# Patient Record
Sex: Female | Born: 1985
Health system: Southern US, Community
[De-identification: ages and names within clinical notes are randomized; demographics above are authoritative.]

## PROBLEM LIST (undated history)

## (undated) DIAGNOSIS — E282 Polycystic ovarian syndrome: Secondary | ICD-10-CM

## (undated) DIAGNOSIS — R112 Nausea with vomiting, unspecified: Secondary | ICD-10-CM

## (undated) DIAGNOSIS — Q103 Other congenital malformations of eyelid: Secondary | ICD-10-CM

## (undated) DIAGNOSIS — Z9889 Other specified postprocedural states: Secondary | ICD-10-CM

## (undated) DIAGNOSIS — E559 Vitamin D deficiency, unspecified: Secondary | ICD-10-CM

## (undated) DIAGNOSIS — Z973 Presence of spectacles and contact lenses: Secondary | ICD-10-CM

## (undated) DIAGNOSIS — E669 Obesity, unspecified: Secondary | ICD-10-CM

## (undated) DIAGNOSIS — E288 Other ovarian dysfunction: Secondary | ICD-10-CM

## (undated) DIAGNOSIS — M549 Dorsalgia, unspecified: Secondary | ICD-10-CM

## (undated) DIAGNOSIS — D649 Anemia, unspecified: Secondary | ICD-10-CM

## (undated) DIAGNOSIS — E049 Nontoxic goiter, unspecified: Secondary | ICD-10-CM

## (undated) DIAGNOSIS — E538 Deficiency of other specified B group vitamins: Secondary | ICD-10-CM

## (undated) DIAGNOSIS — N979 Female infertility, unspecified: Secondary | ICD-10-CM

## (undated) DIAGNOSIS — E2839 Other primary ovarian failure: Secondary | ICD-10-CM

## (undated) DIAGNOSIS — R7303 Prediabetes: Secondary | ICD-10-CM

## (undated) DIAGNOSIS — R002 Palpitations: Secondary | ICD-10-CM

## (undated) HISTORY — DX: Dorsalgia, unspecified: M54.9

## (undated) HISTORY — DX: Female infertility, unspecified: N97.9

## (undated) HISTORY — PX: ADENOIDECTOMY: SUR15

## (undated) HISTORY — DX: Vitamin D deficiency, unspecified: E55.9

## (undated) HISTORY — DX: Obesity, unspecified: E66.9

## (undated) HISTORY — DX: Deficiency of other specified B group vitamins: E53.8

## (undated) HISTORY — DX: Prediabetes: R73.03

## (undated) HISTORY — PX: TONSILLECTOMY: SUR1361

## (undated) HISTORY — DX: Other congenital malformations of eyelid: Q10.3

## (undated) HISTORY — DX: Polycystic ovarian syndrome: E28.2

## (undated) HISTORY — PX: EYE SURGERY: SHX253

---

## 2007-11-04 ENCOUNTER — Emergency Department (HOSPITAL_COMMUNITY): Admission: EM | Admit: 2007-11-04 | Discharge: 2007-11-04 | Payer: Self-pay | Admitting: Emergency Medicine

## 2008-06-21 DIAGNOSIS — S83106A Unspecified dislocation of unspecified knee, initial encounter: Secondary | ICD-10-CM

## 2008-06-21 HISTORY — DX: Unspecified dislocation of unspecified knee, initial encounter: S83.106A

## 2011-03-14 ENCOUNTER — Inpatient Hospital Stay (INDEPENDENT_AMBULATORY_CARE_PROVIDER_SITE_OTHER)
Admission: RE | Admit: 2011-03-14 | Discharge: 2011-03-14 | Disposition: A | Discharge status: Home / Self Care | Payer: 59 | Source: Ambulatory Visit | Attending: Emergency Medicine | Admitting: Emergency Medicine

## 2011-03-14 DIAGNOSIS — S91309A Unspecified open wound, unspecified foot, initial encounter: Secondary | ICD-10-CM

## 2014-04-09 ENCOUNTER — Ambulatory Visit (HOSPITAL_COMMUNITY)
Admission: RE | Admit: 2014-04-09 | Discharge: 2014-04-09 | Disposition: A | Discharge status: Home / Self Care | Payer: 59 | Source: Ambulatory Visit | Attending: Obstetrics and Gynecology | Admitting: Obstetrics and Gynecology

## 2014-04-09 ENCOUNTER — Other Ambulatory Visit (HOSPITAL_COMMUNITY): Payer: Self-pay

## 2014-04-09 DIAGNOSIS — Z13228 Encounter for screening for other metabolic disorders: Secondary | ICD-10-CM | POA: Diagnosis not present

## 2014-04-09 DIAGNOSIS — Z315 Encounter for genetic counseling: Secondary | ICD-10-CM | POA: Insufficient documentation

## 2014-04-09 LAB — CHROMOSOMES ANALYSIS FOR CF

## 2014-04-18 ENCOUNTER — Encounter (HOSPITAL_COMMUNITY): Payer: Self-pay

## 2014-04-18 NOTE — Progress Notes (Signed)
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 [employer] Genetic Counseling Intern, Santiago Bumpers, assisted with genetic counseling under my direct supervision.   We began by reviewing the family history in detail. 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 She reportedly does not know if the BPES in the family is type I or type II. She had one corrective surgery at 28 years of age and currently wears glasses. [name] reports irregular periods and is currently taking metformin for treatment of Polycystic Ovarian syndrome (PCOS). She reported that she has not been pregnant and has not attempted to conceive. She reported multiple relatives with BPES, and the specific type of BPES in the family is not known.  [name] reported that her father had a diagnosis of BPES and had surgical correction in adulthood. He died in 09/16/2000 from myocardial infarction. She reported that her oldest sibling (a sister) is 8 years old and has BPES. This sister reportedly had corrective surgery in childhood for BPES and does not wear glasses currently. This sister has one 62 year old daughter with BPES and otherwise healthy. [name] reported that her sister did not have trouble conceiving. This daughter (the patient's niece) has had corrective surgery and does not currently wear glasses. The patient reported that her brother (age 66 years) also has BPES. He did not have corrective surgery and does not wear glasses. This brother reportedly has two children: one son with BPES and one unaffected daughter. [name] has a fraternal twin sister who does not have BPES.   [name] reported additional paternal  relatives with BPES: one paternal uncle and her paternal grandfather.  Her father's other brother and maternal half-sister reportedly do not have BPES.  Ms. Shann Gaines paternal uncle with BPES married the patient's mother's sister, and this couple has three daughters. Two of the three daughters (the patient's first cousins) reportedly have BPES. The older of the affected daughters is 41 years old and has a history of infertility. She has been trying to conceive for several years without success. She reportedly has polycystic ovarian syndrome and an enlarged heart.  Consanguinity between the maternal and paternal side of the family was denied.   BPES is a genetic condition that affects the development of the eye and eyelids. The name of the syndrome comes from the various eye findings that have been associated with the condition: blepharophimosis (narrowing of the eye opening), ptosis (eyelid drooping), and epicanthus inversus (upward fold of the skin in the lower eyelid near the inner corner of the eye). The diagnosis of BPES is based on the presence of these three clinical findings, with the addition of telecanthus (lateral displacement of inner canthi) and are present at birth. Individuals with BPES are at risk of developing secondary vision problems including myopia, hyperopia, strabismus, or amblyopia. Some individuals with BPES have distinctive facial features including a broad nasal bridge, low set ears, a short philtrum, and wide set eyes. We discussed that BPES is currently described to have two types: Type I and Type II. Both types share the facial features and eye findings. BPES Type I is also associated with Primary Ovarian Insufficiency (POI). BPES Type II includes only the  four major features.   Primary Ovarian Insufficiency is defined as oligomenorrhea or amenorrhea occurring prior to age 40 years.  Clinical features of POI can include irregular menstrual cycles, difficulty conceiving, and symptoms  related to estrogen deficiency. Typical biochemical features of POI include low serum estradiol and elevated serum FSH concentrations.  We discussed that women with POI can have varying features. We also discussed the variable expressivity of BPES, even within families.  Regarding establishing the risk for POI in an individual with BPES, family history can indicate the type of BPES in affected females. Additionally, molecular genetic testing may be helpful in some cases in assessing the risk for POI. Females with BPES may be evaluated by a pediatric or adult endocrinologist to assess onset and course of POI.   BPES is an autosomal dominant genetic condition. We reviewed genes, chromosomes, and autosomal dominant inheritance.  BPES is caused by changes in FOXL2 gene. We discussed that FOXL2 is a transcription factor that directs the function of other genes. It has been shown that the resulting protein is active in the eyelids, ovaries and pituitary gland. Specifically, during development the protein is active in the muscles of the eyelids. It was explained that changes in the FOXL2 gene disrupt this developmental pathway and lead to the characteristic eye findings associated with BPES. It was discussed that FOXL2 regulates granulosa/follicular cells in the ovaries in addition to regulating apoptosis. A change in FOXL2 associated with BPES Type I leads to a rapid maturation of granulosa/follicular cells during ovulation and results in the egg cell breaking down prematurely. In autosomal dominant inheritance, a person typically has the condition when they have one changed (nonworking) copy of a particular gene pair.  There is a 50% (1 in 2) chance that an individual with an autosomal dominant condition would pass on the changed (nonworking) gene copy to each offspring. Thus, in BPES, each pregnancy of an affected individual has 1 in 2 (50%) chance to inherit BPES. Nearly complete penetrance is described for BPES for  the eye findings.   All individuals with BPES in a family would be expected to have the same type. However, variable expressivity is described, even within families, meaning that the specific features present for each individual can vary, including features of POI for type I BPES. Thus, [name] would have a 1 in 2 (50%) chance for each pregnancy to inherit BPES.  We discussed that changes in the FOXL2 gene are causative of both types of BPES. About 10-15% of individuals with BPES have a genetic deletion encompassing the FOXL2 gene; 72% of individuals have sequence changes within the gene; and 5% of individuals have genetic mutations in external regulatory regions. A molecular cause for BPES is found in the majority but not all individuals with a clinical diagnosis.  Some genotype-phenotype correlation has been described for BPES, meaning that the type of genetic change in FOXL2 determines the type of BPES present in some, but not all, cases. For example, polyalanine expansion within the FOXL2 gene has been described to preferentially lead to BPES Type II, and pathogenic changes in the FOXL2 gene that lead to protein truncation prior to the polyalanine tract preferentially lead to BPES type I (associated with POI). It was explained that while there are some genotypes that are associated with both Type I and II, these classifications are not always strictly defined. Thus, while there appears to be an association between the level of function of the changed gene and the type of  BPES developed, these are not diagnostic classifications.   Given the reported family history of the female cousin with BPES and history of infertility, we discussed that type I BPES is most likely the type present in Ms. Davis's family. However, we discussed that additional older relatives with BPES are female and thus, not informative regarding the risk for POI in the family and also that infertility/POI have also been described in  families with BPES due to other etiologies.   We discussed that genetic testing is clinically available for FOXL2 and, in some cases, may be helpful in determining  which type of BPES is in the family and confirming a clinical diagnosis of BPES. We reviewed the risks, benefits, and limitations of molecular testing. We discussed that there were various possibilities for her genetic testing results including: an identified genetic cause with clear genotype-phenotype correlation (leading to determination of the type of BPES in the patient), an identified genetic cause for BPES without clear association with a specific type of BPES, no identified gene change, or a benign gene change or gene change of uncertain significance.  In the last potential scenario, family testing for variant of uncertain significance could be informative in determining suspected pathogenicity, but not diagnostic. [name] understands that molecular testing for FOXL2 can, in some but not all cases, determine the specific type of BPES in the family and is not always informative for individuals with a clinical diagnosis of BPES.   After careful consideration, [name] chose to have sequencing and deletion/duplication analysis of the FOXL2 gene through [facility]. She was counseled regarding the expense of this testing.  The amount she will be responsible for, out of pocket, depends upon the  specific plan and is subject to co-pay, co-insurance and/or deductible. We discussed that turnaround time for this testing is approximately 4-6 weeks. We discussed that she would be called by our office when the result was available.     The family histories were otherwise found to be noncontributory for birth defects, intellectual disability, and known genetic conditions. Without further information regarding the provided family history, an accurate genetic risk cannot be calculated. Further  genetic counseling is warranted if more information is obtained.  Ms. Caraline Deutschman was provided with written information regarding cystic fibrosis (CF) including the carrier frequency and incidence in the Caucasian population, the availability of carrier testing and prenatal diagnosis if indicated. In addition, we discussed that CF is routinely screened for as part of the [place] newborn screening panel. She elected to pursue testing today.  I counseled [name] regarding the above risks and available options.  The approximate face-to-face time with the genetic counselor was 45 minutes.  Chipper Oman, MS Certified Genetic Counselor 04/18/2014

## 2014-04-23 ENCOUNTER — Telehealth (HOSPITAL_COMMUNITY): Payer: Self-pay | Admitting: MS"

## 2014-04-23 NOTE — Telephone Encounter (Signed)
Called [name] regarding results of cystic fibrosis carrier screening, which yielded a normal/negative result for the 32 most common disease-causing mutations analyzed, meaning that the risk to be a CF carrier can be reduced from 1 in 25 to approximately 1 in 250.  The patient understands that CF carrier screening can reduce but not eliminate the chance to be a CF carrier.   [name] 04/23/2014 11:21 AM

## 2014-04-26 ENCOUNTER — Other Ambulatory Visit (HOSPITAL_COMMUNITY): Payer: Self-pay

## 2014-05-14 ENCOUNTER — Telehealth (HOSPITAL_COMMUNITY): Payer: Self-pay | Admitting: MS"

## 2014-05-14 NOTE — Telephone Encounter (Signed)
[employer] Genetic Counseling Intern, Santiago Bumpers, called [name] regarding genetic testing results for BPES. Molecular testing for FOXL2 was performed through [facility]. [name] elected to proceed with FOXL2 molecular testing given her clinical diagnosis of BPES. Patient was identified by name and DOB. 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 This mutation has been previously reported in patients with BPES and causes a truncated protein containing an intact forkhead domain and polyalanine tract. It was discussed with the patient that no correlation can be made between the genotype and BPES phenotype [facility] Lacinda Axon Hum genet 37: [phone], 2003). The patient was understandably disappointed by this information. We discussed that endocrinologic and gynecologic follow-up are advised in females with BPES in whom the BPES is unknown or in whom BPES type I is suspected based on family history. The patient was encouraged to contact us with additional questions or concerns.   Chipper Oman, MS Certified Genetic Counselor 05/14/2014 9:33 AM

## 2014-05-21 ENCOUNTER — Other Ambulatory Visit (HOSPITAL_COMMUNITY): Payer: Self-pay

## 2014-06-17 ENCOUNTER — Ambulatory Visit: Payer: 59 | Admitting: *Deleted

## 2014-06-20 ENCOUNTER — Ambulatory Visit: Payer: 59 | Admitting: *Deleted

## 2014-06-28 ENCOUNTER — Encounter: Payer: Self-pay | Admitting: Dietician

## 2014-06-28 ENCOUNTER — Encounter: Payer: 59 | Attending: "Endocrinology | Admitting: Dietician

## 2014-06-28 DIAGNOSIS — Z6841 Body Mass Index (BMI) 40.0 and over, adult: Secondary | ICD-10-CM | POA: Diagnosis not present

## 2014-06-28 DIAGNOSIS — E669 Obesity, unspecified: Secondary | ICD-10-CM | POA: Insufficient documentation

## 2014-06-28 DIAGNOSIS — Z713 Dietary counseling and surveillance: Secondary | ICD-10-CM | POA: Diagnosis not present

## 2014-06-28 NOTE — Progress Notes (Signed)
  Medical Nutrition Therapy:  Appt start time: 0945 end time:  1100.   Assessment:  Primary concerns today: PCOS, inability to lose weight.  Patient reports recent diagnosis of bpes, premature ovarian failure and pcos.. Now taking Metformin.  Some problems with diarrhea from medication.  States that HgbA1C was 5.1%.  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  Has begun exercising 30 minutes 3 times per week.  Had been tracking intake with my fitness pal but frustrated by tediousness.  Preferred Learning Style:   Visual  Learning Readiness  Ready  MEDICATIONS: Metformin 500 mg in am and 100 mg in pm.  Takes with meals   DIETARY INTAKE:  Patient has begun cooking at home more and being more mindful of meal choices.  Diet recall less than actual intake.    24-hr recall:  B ( AM): cereal with milk or omlet with fruit  Snk ( AM): none  L ( PM): subway or salad or sandwich Snk ( PM): none D ( PM): pasta, chicken Snk ( PM): fruit or chips and dip Beverages: more water, little soda  Usual physical activity: gym 30 minutes 3 x per week, 8000-10,000 steps on fit bit at work  Estimated energy needs: 1800 calories 200 g carbohydrates 113 g protein 60 g fat  Progress Towards Goal(s):  In progress.   Nutritional Diagnosis:  NB-1.1 Food and nutrition-related knowledge deficit As related to CHO and protein balance.  As evidenced by diet hx and patient report..    Intervention:  Nutrition education on a CHO balanced diet with increased emphasis of whole foods. Recommendations: Be mindful of portion sizes and CHO servings Increase activity as able  Teaching Method Utilized:  Visual Auditory Hands on  Handouts given during visit include:  Yellow Carb Card  Low Carb/protein snack list  Label reading  Barriers to learning/adherence to lifestyle change: Work schedule  Demonstrated degree of understanding via:  Teach Back   Monitoring/Evaluation:  Dietary intake, exercise, label reading, and  body weight prn.

## 2014-08-01 ENCOUNTER — Encounter: Payer: Self-pay | Admitting: Nurse Practitioner

## 2014-08-01 ENCOUNTER — Ambulatory Visit (INDEPENDENT_AMBULATORY_CARE_PROVIDER_SITE_OTHER): Payer: 59 | Admitting: Nurse Practitioner

## 2014-08-01 VITALS — BP 120/80 | HR 74 | Temp 97.8°F | Ht 68.5 in | Wt 361.0 lb

## 2014-08-01 DIAGNOSIS — Q103 Other congenital malformations of eyelid: Secondary | ICD-10-CM | POA: Insufficient documentation

## 2014-08-01 DIAGNOSIS — Z23 Encounter for immunization: Secondary | ICD-10-CM

## 2014-08-01 DIAGNOSIS — E282 Polycystic ovarian syndrome: Secondary | ICD-10-CM | POA: Insufficient documentation

## 2014-08-01 DIAGNOSIS — Z0000 Encounter for general adult medical examination without abnormal findings: Secondary | ICD-10-CM

## 2014-08-01 DIAGNOSIS — E049 Nontoxic goiter, unspecified: Secondary | ICD-10-CM

## 2014-08-01 DIAGNOSIS — Z6841 Body Mass Index (BMI) 40.0 and over, adult: Secondary | ICD-10-CM | POA: Insufficient documentation

## 2014-08-01 DIAGNOSIS — R5383 Other fatigue: Secondary | ICD-10-CM

## 2014-08-01 LAB — CBC WITH DIFFERENTIAL/PLATELET
BASOS ABS: 0 10*3/uL (ref 0.0–0.1)
BASOS PCT: 0.3 % (ref 0.0–3.0)
EOS PCT: 2.9 % (ref 0.0–5.0)
Eosinophils Absolute: 0.2 10*3/uL (ref 0.0–0.7)
HCT: 35.8 % — ABNORMAL LOW (ref 36.0–46.0)
HEMOGLOBIN: 11.9 g/dL — AB (ref 12.0–15.0)
LYMPHS PCT: 28 % (ref 12.0–46.0)
Lymphs Abs: 2.1 10*3/uL (ref 0.7–4.0)
MCHC: 33.2 g/dL (ref 30.0–36.0)
MCV: 78.8 fl (ref 78.0–100.0)
MONOS PCT: 7.9 % (ref 3.0–12.0)
Monocytes Absolute: 0.6 10*3/uL (ref 0.1–1.0)
NEUTROS ABS: 4.5 10*3/uL (ref 1.4–7.7)
Neutrophils Relative %: 60.9 % (ref 43.0–77.0)
Platelets: 204 10*3/uL (ref 150.0–400.0)
RBC: 4.55 Mil/uL (ref 3.87–5.11)
RDW: 15.7 % — ABNORMAL HIGH (ref 11.5–15.5)
WBC: 7.4 10*3/uL (ref 4.0–10.5)

## 2014-08-01 LAB — URINALYSIS, MICROSCOPIC ONLY

## 2014-08-01 LAB — COMPREHENSIVE METABOLIC PANEL
ALT: 22 U/L (ref 0–35)
AST: 19 U/L (ref 0–37)
Albumin: 4 g/dL (ref 3.5–5.2)
Alkaline Phosphatase: 64 U/L (ref 39–117)
BUN: 13 mg/dL (ref 6–23)
CALCIUM: 9.1 mg/dL (ref 8.4–10.5)
CHLORIDE: 103 meq/L (ref 96–112)
CO2: 26 mEq/L (ref 19–32)
CREATININE: 0.69 mg/dL (ref 0.40–1.20)
GFR: 108.56 mL/min (ref >60.00)
Glucose, Bld: 83 mg/dL (ref 70–99)
Potassium: 4.6 mEq/L (ref 3.5–5.1)
Sodium: 137 mEq/L (ref 135–145)
Total Bilirubin: 0.5 mg/dL (ref 0.2–1.2)
Total Protein: 6.9 g/dL (ref 6.0–8.3)

## 2014-08-01 LAB — LIPID PANEL
CHOLESTEROL: 188 mg/dL (ref 0–200)
HDL: 49.5 mg/dL (ref >39.00)
LDL CALC: 117 mg/dL — AB (ref 0–99)
NonHDL: 138.5
Total CHOL/HDL Ratio: 4
Triglycerides: 109 mg/dL (ref 0.0–149.0)
VLDL: 21.8 mg/dL (ref 0.0–40.0)

## 2014-08-01 LAB — VITAMIN B12: VITAMIN B 12: 262 pg/mL (ref 211–911)

## 2014-08-01 LAB — VITAMIN D 25 HYDROXY (VIT D DEFICIENCY, FRACTURES): VITD: 17.15 ng/mL — AB (ref 30.00–100.00)

## 2014-08-01 LAB — TSH: TSH: 1.77 u[IU]/mL (ref 0.35–4.50)

## 2014-08-01 LAB — T4, FREE: Free T4: 1.2 ng/dL (ref 0.60–1.60)

## 2014-08-01 NOTE — Patient Instructions (Signed)
My office will call with lab results.  Develop lifelong habits of exercise most days of the week: take a 30 minute walk. The benefits include weight loss, lower risk for heart disease, diabetes, stroke, high blood pressure, lower rates of depression & dementia, better sleep quality & bone health.  Consider reading Eat to Live by Excell Seltzer and begin implementing principles. Replace foods made with flour with plant foods like beans, peas, other vegetables and fresh fruit.  Cut out refined sugar:anything that is sweet when you eat or drink it except fresh fruit. Cut out white bread, rolls, biscuits, bagels, muffins, pasta and cereals. Breads & cereals that have 4 gm or more of fiber per serving are good. Whole wheat pasta, brown rice and quinoa are good.  For 6 weeks, incorporate portion sizes.  See me in 6 weeks!  Very nice to meet you!  Our office will call you with lab results and any necessary follow up. Pleasure to meet you!  Preventive Care for Adults, Female A healthy lifestyle and preventive care can promote health and wellness. Preventive health guidelines for women include the following key practices.  A routine yearly physical is a good way to check with your caregiver about your health and preventive screening. It is a chance to share any concerns and updates on your health, and to receive a thorough exam.  Visit your dentist for a routine exam and preventive care every 6 months. Brush your teeth twice a day and floss once a day. Good oral hygiene prevents tooth decay and gum disease.  The frequency of eye exams is based on your age, health, family medical history, use of contact lenses, and other factors. Follow your caregiver's recommendations for frequency of eye exams.  Eat a healthy diet. Foods like vegetables, fruits, whole grains, low-fat dairy products, and lean protein foods contain the nutrients you need without too many calories. Decrease your intake of foods high in  solid fats, added sugars, and salt. Eat the right amount of calories for you.Get information about a proper diet from your caregiver, if necessary.  Regular physical exercise is one of the most important things you can do for your health. Most adults should get at least 150 minutes of moderate-intensity exercise (any activity that increases your heart rate and causes you to sweat) each week. In addition, most adults need muscle-strengthening exercises on 2 or more days a week.  Maintain a healthy weight. The body mass index (BMI) is a screening tool to identify possible weight problems. It provides an estimate of body fat based on height and weight. Your caregiver can help determine your BMI, and can help you achieve or maintain a healthy weight.For adults 20 years and older:  A BMI below 18.5 is considered underweight.  A BMI of 18.5 to 24.9 is normal.  A BMI of 25 to 29.9 is considered overweight.  A BMI of 30 and above is considered obese.  Maintain normal blood lipids and cholesterol levels by exercising and minimizing your intake of saturated fat. Eat a balanced diet with plenty of fruit and vegetables. Blood tests for lipids and cholesterol should begin at age 72 and be repeated every 5 years. If your lipid or cholesterol levels are high, you are over 50, or you are at high risk for heart disease, you may need your cholesterol levels checked more frequently.Ongoing high lipid and cholesterol levels should be treated with medicines if diet and exercise are not effective.  If you smoke, find  out from your caregiver how to quit. If you do not use tobacco, do not start.  Lung cancer screening is recommended for adults aged 54 80 years who are at high risk for developing lung cancer because of a history of smoking. Yearly low-dose computed tomography (CT) is recommended for people who have at least a 30-pack-year history of smoking and are a current smoker or have quit within the past 15 years.  A pack year of smoking is smoking an average of 1 pack of cigarettes a day for 1 year (for example: 1 pack a day for 30 years or 2 packs a day for 15 years). Yearly screening should continue until the smoker has stopped smoking for at least 15 years. Yearly screening should also be stopped for people who develop a health problem that would prevent them from having lung cancer treatment.  If you are pregnant, do not drink alcohol. If you are breastfeeding, be very cautious about drinking alcohol. If you are not pregnant and choose to drink alcohol, do not exceed 1 drink per day. One drink is considered to be 12 ounces (355 mL) of beer, 5 ounces (148 mL) of wine, or 1.5 ounces (44 mL) of liquor.  Avoid use of street drugs. Do not share needles with anyone. Ask for help if you need support or instructions about stopping the use of drugs.  High blood pressure causes heart disease and increases the risk of stroke. Your blood pressure should be checked at least every 1 to 2 years. Ongoing high blood pressure should be treated with medicines if weight loss and exercise are not effective.  If you are 48 to 27 years old, ask your caregiver if you should take aspirin to prevent strokes.  Diabetes screening involves taking a blood sample to check your fasting blood sugar level. This should be done once every 3 years, after age 4, if you are within normal weight and without risk factors for diabetes. Testing should be considered at a younger age or be carried out more frequently if you are overweight and have at least 1 risk factor for diabetes.  Breast cancer screening is essential preventive care for women. You should practice "breast self-awareness." This means understanding the normal appearance and feel of your breasts and may include breast self-examination. Any changes detected, no matter how small, should be reported to a caregiver. Women in their 54s and 30s should have a clinical breast exam (CBE) by a  caregiver as part of a regular health exam every 1 to 3 years. After age 37, women should have a CBE every year. Starting at age 3, women should consider having a mammography (breast X-ray test) every year. Women who have a family history of breast cancer should talk to their caregiver about genetic screening. Women at a high risk of breast cancer should talk to their caregivers about having magnetic resonance imaging (MRI) and a mammography every year.  Breast cancer gene (BRCA)-related cancer risk assessment is recommended for women who have family members with BRCA-related cancers. BRCA-related cancers include breast, ovarian, tubal, and peritoneal cancers. Having family members with these cancers may be associated with an increased risk for harmful changes (mutations) in the breast cancer genes BRCA1 and BRCA2. Results of the assessment will determine the need for genetic counseling and BRCA1 and BRCA2 testing.  The Pap test is a screening test for cervical cancer. A Pap test can show cell changes on the cervix that might become cervical cancer if left  untreated. A Pap test is a procedure in which cells are obtained and examined from the lower end of the uterus (cervix).  Women should have a Pap test starting at age 65.  Between ages 40 and 74, Pap tests should be repeated every 2 years.  Beginning at age 22, you should have a Pap test every 3 years as long as the past 3 Pap tests have been normal.  Some women have medical problems that increase the chance of getting cervical cancer. Talk to your caregiver about these problems. It is especially important to talk to your caregiver if a new problem develops soon after your last Pap test. In these cases, your caregiver may recommend more frequent screening and Pap tests.  The above recommendations are the same for women who have or have not gotten the vaccine for human papillomavirus (HPV).  If you had a hysterectomy for a problem that was not  cancer or a condition that could lead to cancer, then you no longer need Pap tests. Even if you no longer need a Pap test, a regular exam is a good idea to make sure no other problems are starting.  If you are between ages 31 and 89, and you have had normal Pap tests going back 10 years, you no longer need Pap tests. Even if you no longer need a Pap test, a regular exam is a good idea to make sure no other problems are starting.  If you have had past treatment for cervical cancer or a condition that could lead to cancer, you need Pap tests and screening for cancer for at least 20 years after your treatment.  If Pap tests have been discontinued, risk factors (such as a new sexual partner) need to be reassessed to determine if screening should be resumed.  The HPV test is an additional test that may be used for cervical cancer screening. The HPV test looks for the virus that can cause the cell changes on the cervix. The cells collected during the Pap test can be tested for HPV. The HPV test could be used to screen women aged 91 years and older, and should be used in women of any age who have unclear Pap test results. After the age of 5, women should have HPV testing at the same frequency as a Pap test.  Colorectal cancer can be detected and often prevented. Most routine colorectal cancer screening begins at the age of 71 and continues through age 73. However, your caregiver may recommend screening at an earlier age if you have risk factors for colon cancer. On a yearly basis, your caregiver may provide home test kits to check for hidden blood in the stool. Use of a small camera at the end of a tube, to directly examine the colon (sigmoidoscopy or colonoscopy), can detect the earliest forms of colorectal cancer. Talk to your caregiver about this at age 17, when routine screening begins. Direct examination of the colon should be repeated every 5 to 10 years through age 48, unless early forms of pre-cancerous  polyps or small growths are found.  Hepatitis C blood testing is recommended for all people born from [birth year] through 1965 and any individual with known risks for hepatitis C.  Practice safe sex. Use condoms and avoid high-risk sexual practices to reduce the spread of sexually transmitted infections (STIs). STIs include gonorrhea, chlamydia, syphilis, trichomonas, herpes, HPV, and human immunodeficiency virus (HIV). Herpes, HIV, and HPV are viral illnesses that have no cure. They  can result in disability, cancer, and death. Sexually active women aged 58 and younger should be checked for chlamydia. Older women with new or multiple partners should also be tested for chlamydia. Testing for other STIs is recommended if you are sexually active and at increased risk.  Osteoporosis is a disease in which the bones lose minerals and strength with aging. This can result in serious bone fractures. The risk of osteoporosis can be identified using a bone density scan. Women ages 55 and over and women at risk for fractures or osteoporosis should discuss screening with their caregivers. Ask your caregiver whether you should take a calcium supplement or vitamin D to reduce the rate of osteoporosis.  Menopause can be associated with physical symptoms and risks. Hormone replacement therapy is available to decrease symptoms and risks. You should talk to your caregiver about whether hormone replacement therapy is right for you.  Use sunscreen. Apply sunscreen liberally and repeatedly throughout the day. You should seek shade when your shadow is shorter than you. Protect yourself by wearing long sleeves, pants, a wide-brimmed hat, and sunglasses year round, whenever you are outdoors.  Once a month, do a whole body skin exam, using a mirror to look at the skin on your back. Notify your caregiver of new moles, moles that have irregular borders, moles that are larger than a pencil eraser, or moles that have changed in shape or  color.  Stay current with required immunizations.  Influenza vaccine. All adults should be immunized every year.  Tetanus, diphtheria, and acellular pertussis (Td, Tdap) vaccine. Pregnant women should receive 1 dose of Tdap vaccine during each pregnancy. The dose should be obtained regardless of the length of time since the last dose. Immunization is preferred during the 27th to 36th week of gestation. An adult who has not previously received Tdap or who does not know her vaccine status should receive 1 dose of Tdap. This initial dose should be followed by tetanus and diphtheria toxoids (Td) booster doses every 10 years. Adults with an unknown or incomplete history of completing a 3-dose immunization series with Td-containing vaccines should begin or complete a primary immunization series including a Tdap dose. Adults should receive a Td booster every 10 years.  Varicella vaccine. An adult without evidence of immunity to varicella should receive 2 doses or a second dose if she has previously received 1 dose. Pregnant females who do not have evidence of immunity should receive the first dose after pregnancy. This first dose should be obtained before leaving the health care facility. The second dose should be obtained 4 8 weeks after the first dose.  Human papillomavirus (HPV) vaccine. Females aged 42 26 years who have not received the vaccine previously should obtain the 3-dose series. The vaccine is not recommended for use in pregnant females. However, pregnancy testing is not needed before receiving a dose. If a female is found to be pregnant after receiving a dose, no treatment is needed. In that case, the remaining doses should be delayed until after the pregnancy. Immunization is recommended for any person with an immunocompromised condition through the age of 36 years if she did not get any or all doses earlier. During the 3-dose series, the second dose should be obtained 4 8 weeks after the first  dose. The third dose should be obtained 24 weeks after the first dose and 16 weeks after the second dose.  Zoster vaccine. One dose is recommended for adults aged 59 years or older unless certain  conditions are present.  Measles, mumps, and rubella (MMR) vaccine. Adults born before [birth year] generally are considered immune to measles and mumps. Adults born in [birth year] or later should have 1 or more doses of MMR vaccine unless there is a contraindication to the vaccine or there is laboratory evidence of immunity to each of the three diseases. A routine second dose of MMR vaccine should be obtained at least 28 days after the first dose for students attending postsecondary schools, health care workers, or international travelers. People who received inactivated measles vaccine or an unknown type of measles vaccine during 1963 1967 should receive 2 doses of MMR vaccine. People who received inactivated mumps vaccine or an unknown type of mumps vaccine before 1979 and are at high risk for mumps infection should consider immunization with 2 doses of MMR vaccine. For females of childbearing age, rubella immunity should be determined. If there is no evidence of immunity, females who are not pregnant should be vaccinated. If there is no evidence of immunity, females who are pregnant should delay immunization until after pregnancy. Unvaccinated health care workers born before [birth year] who lack laboratory evidence of measles, mumps, or rubella immunity or laboratory confirmation of disease should consider measles and mumps immunization with 2 doses of MMR vaccine or rubella immunization with 1 dose of MMR vaccine.  Pneumococcal 13-valent conjugate (PCV13) vaccine. When indicated, a person who is uncertain of her immunization history and has no record of immunization should receive the PCV13 vaccine. An adult aged 70 years or older who has certain medical conditions and has not been previously immunized should receive 1 dose of PCV13  vaccine. This PCV13 should be followed with a dose of pneumococcal polysaccharide (PPSV23) vaccine. The PPSV23 vaccine dose should be obtained at least 8 weeks after the dose of PCV13 vaccine. An adult aged 48 years or older who has certain medical conditions and previously received 1 or more doses of PPSV23 vaccine should receive 1 dose of PCV13. The PCV13 vaccine dose should be obtained 1 or more years after the last PPSV23 vaccine dose.  Pneumococcal polysaccharide (PPSV23) vaccine. When PCV13 is also indicated, PCV13 should be obtained first. All adults aged 23 years and older should be immunized. An adult younger than age 18 years who has certain medical conditions should be immunized. Any person who resides in a nursing home or long-term care facility should be immunized. An adult smoker should be immunized. People with an immunocompromised condition and certain other conditions should receive both PCV13 and PPSV23 vaccines. People with human immunodeficiency virus (HIV) infection should be immunized as soon as possible after diagnosis. Immunization during chemotherapy or radiation therapy should be avoided. Routine use of PPSV23 vaccine is not recommended for American Indians, [place] Natives, or people younger than 65 years unless there are medical conditions that require PPSV23 vaccine. When indicated, people who have unknown immunization and have no record of immunization should receive PPSV23 vaccine. One-time revaccination 5 years after the first dose of PPSV23 is recommended for people aged 69 64 years who have chronic kidney failure, nephrotic syndrome, asplenia, or immunocompromised conditions. People who received 1 2 doses of PPSV23 before age 46 years should receive another dose of PPSV23 vaccine at age 44 years or later if at least 5 years have passed since the previous dose. Doses of PPSV23 are not needed for people immunized with PPSV23 at or after age 75 years.  Meningococcal vaccine. Adults  with asplenia or persistent complement component deficiencies should receive 2  doses of quadrivalent meningococcal conjugate (MenACWY-D) vaccine. The doses should be obtained at least 2 months apart. Microbiologists working with certain meningococcal bacteria, [employer] recruits, people at risk during an outbreak, and people who travel to or live in countries with a high rate of meningitis should be immunized. A first-year college student up through age 29 years who is living in a residence hall should receive a dose if she did not receive a dose on or after her 16th birthday. Adults who have certain high-risk conditions should receive one or more doses of vaccine.  Hepatitis A vaccine. Adults who wish to be protected from this disease, have certain high-risk conditions, work with hepatitis A-infected animals, work in hepatitis A research labs, or travel to or work in countries with a high rate of hepatitis A should be immunized. Adults who were previously unvaccinated and who anticipate close contact with an international adoptee during the first 60 days after arrival in the [place] from a country with a high rate of hepatitis A should be immunized.  Hepatitis B vaccine. Adults who wish to be protected from this disease, have certain high-risk conditions, may be exposed to blood or other infectious body fluids, are household contacts or sex partners of hepatitis B positive people, are clients or workers in certain care facilities, or travel to or work in countries with a high rate of hepatitis B should be immunized.  Haemophilus influenzae type b (Hib) vaccine. A previously unvaccinated person with asplenia or sickle cell disease or having a scheduled splenectomy should receive 1 dose of Hib vaccine. Regardless of previous immunization, a recipient of a hematopoietic stem cell transplant should receive a 3-dose series 6 12 months after her successful transplant. Hib vaccine is not recommended for adults  with HIV infection. Preventive Services / Frequency Ages 78 to 3  Blood pressure check.** / Every 1 to 2 years.  Lipid and cholesterol check.** / Every 5 years beginning at age 22.  Clinical breast exam.** / Every 3 years for women in their 27s and 27s.  BRCA-related cancer risk assessment.** / For women who have family members with a BRCA-related cancer (breast, ovarian, tubal, or peritoneal cancers).  Pap test.** / Every 2 years from ages 40 through 63. Every 3 years starting at age 22 through age 25 or 28 with a history of 3 consecutive normal Pap tests.  HPV screening.** / Every 3 years from ages 29 through ages 50 to 42 with a history of 3 consecutive normal Pap tests.  Hepatitis C blood test.** / For any individual with known risks for hepatitis C.  Skin self-exam. / Monthly.  Influenza vaccine. / Every year.  Tetanus, diphtheria, and acellular pertussis (Tdap, Td) vaccine.** / Consult your caregiver. Pregnant women should receive 1 dose of Tdap vaccine during each pregnancy. 1 dose of Td every 10 years.  Varicella vaccine.** / Consult your caregiver. Pregnant females who do not have evidence of immunity should receive the first dose after pregnancy.  HPV vaccine. / 3 doses over 6 months, if 52 and younger. The vaccine is not recommended for use in pregnant females. However, pregnancy testing is not needed before receiving a dose.  Measles, mumps, rubella (MMR) vaccine.** / You need at least 1 dose of MMR if you were born in 1957 or later. You may also need a 2nd dose. For females of childbearing age, rubella immunity should be determined. If there is no evidence of immunity, females who are not pregnant should be vaccinated.  If there is no evidence of immunity, females who are pregnant should delay immunization until after pregnancy.  Pneumococcal 13-valent conjugate (PCV13) vaccine.** / Consult your caregiver.  Pneumococcal polysaccharide (PPSV23) vaccine.** / 1 to 2 doses if  you smoke cigarettes or if you have certain conditions.  Meningococcal vaccine.** / 1 dose if you are age 28 to 37 years and a Market researcher living in a residence hall, or have one of several medical conditions, you need to get vaccinated against meningococcal disease. You may also need additional booster doses.  Hepatitis A vaccine.** / Consult your caregiver.  Hepatitis B vaccine.** / Consult your caregiver.  Haemophilus influenzae type b (Hib) vaccine.** / Consult your caregiver. Ages 41 to 73  Blood pressure check.** / Every 1 to 2 years.  Lipid and cholesterol check.** / Every 5 years beginning at age 12.  Lung cancer screening. / Every year if you are aged 68 80 years and have a 30-pack-year history of smoking and currently smoke or have quit within the past 15 years. Yearly screening is stopped once you have quit smoking for at least 15 years or develop a health problem that would prevent you from having lung cancer treatment.  Clinical breast exam.** / Every year after age 20.  BRCA-related cancer risk assessment.** / For women who have family members with a BRCA-related cancer (breast, ovarian, tubal, or peritoneal cancers).  Mammogram.** / Every year beginning at age 98 and continuing for as long as you are in good health. Consult with your caregiver.  Pap test.** / Every 3 years starting at age 36 through age 19 or 1 with a history of 3 consecutive normal Pap tests.  HPV screening.** / Every 3 years from ages 69 through ages 43 to 58 with a history of 3 consecutive normal Pap tests.  Fecal occult blood test (FOBT) of stool. / Every year beginning at age 13 and continuing until age 55. You may not need to do this test if you get a colonoscopy every 10 years.  Flexible sigmoidoscopy or colonoscopy.** / Every 5 years for a flexible sigmoidoscopy or every 10 years for a colonoscopy beginning at age 36 and continuing until age 59.  Hepatitis C blood test.** / For  all people born from [birth year] through 1963 and any individual with known risks for hepatitis C.  Skin self-exam. / Monthly.  Influenza vaccine. / Every year.  Tetanus, diphtheria, and acellular pertussis (Tdap/Td) vaccine.** / Consult your caregiver. Pregnant women should receive 1 dose of Tdap vaccine during each pregnancy. 1 dose of Td every 10 years.  Varicella vaccine.** / Consult your caregiver. Pregnant females who do not have evidence of immunity should receive the first dose after pregnancy.  Zoster vaccine.** / 1 dose for adults aged 59 years or older.  Measles, mumps, rubella (MMR) vaccine.** / You need at least 1 dose of MMR if you were born in 1957 or later. You may also need a 2nd dose. For females of childbearing age, rubella immunity should be determined. If there is no evidence of immunity, females who are not pregnant should be vaccinated. If there is no evidence of immunity, females who are pregnant should delay immunization until after pregnancy.  Pneumococcal 13-valent conjugate (PCV13) vaccine.** / Consult your caregiver.  Pneumococcal polysaccharide (PPSV23) vaccine.** / 1 to 2 doses if you smoke cigarettes or if you have certain conditions.  Meningococcal vaccine.** / Consult your caregiver.  Hepatitis A vaccine.** / Consult your caregiver.  Hepatitis B  vaccine.** / Consult your caregiver.  Haemophilus influenzae type b (Hib) vaccine.** / Consult your caregiver. Ages 22 and over  Blood pressure check.** / Every 1 to 2 years.  Lipid and cholesterol check.** / Every 5 years beginning at age 30.  Lung cancer screening. / Every year if you are aged 52 80 years and have a 30-pack-year history of smoking and currently smoke or have quit within the past 15 years. Yearly screening is stopped once you have quit smoking for at least 15 years or develop a health problem that would prevent you from having lung cancer treatment.  Clinical breast exam.** / Every year after age  13.  BRCA-related cancer risk assessment.** / For women who have family members with a BRCA-related cancer (breast, ovarian, tubal, or peritoneal cancers).  Mammogram.** / Every year beginning at age 9 and continuing for as long as you are in good health. Consult with your caregiver.  Pap test.** / Every 3 years starting at age 79 through age 25 or 29 with a 3 consecutive normal Pap tests. Testing can be stopped between 65 and 70 with 3 consecutive normal Pap tests and no abnormal Pap or HPV tests in the past 10 years.  HPV screening.** / Every 3 years from ages 73 through ages 47 or 87 with a history of 3 consecutive normal Pap tests. Testing can be stopped between 65 and 70 with 3 consecutive normal Pap tests and no abnormal Pap or HPV tests in the past 10 years.  Fecal occult blood test (FOBT) of stool. / Every year beginning at age 45 and continuing until age 7. You may not need to do this test if you get a colonoscopy every 10 years.  Flexible sigmoidoscopy or colonoscopy.** / Every 5 years for a flexible sigmoidoscopy or every 10 years for a colonoscopy beginning at age 69 and continuing until age 43.  Hepatitis C blood test.** / For all people born from [birth year] through 1963 and any individual with known risks for hepatitis C.  Osteoporosis screening.** / A one-time screening for women ages 45 and over and women at risk for fractures or osteoporosis.  Skin self-exam. / Monthly.  Influenza vaccine. / Every year.  Tetanus, diphtheria, and acellular pertussis (Tdap/Td) vaccine.** / 1 dose of Td every 10 years.  Varicella vaccine.** / Consult your caregiver.  Zoster vaccine.** / 1 dose for adults aged 47 years or older.  Pneumococcal 13-valent conjugate (PCV13) vaccine.** / Consult your caregiver.  Pneumococcal polysaccharide (PPSV23) vaccine.** / 1 dose for all adults aged 7 years and older.  Meningococcal vaccine.** / Consult your caregiver.  Hepatitis A vaccine.** / Consult  your caregiver.  Hepatitis B vaccine.** / Consult your caregiver.  Haemophilus influenzae type b (Hib) vaccine.** / Consult your caregiver. ** Family history and personal history of risk and conditions may change your caregiver's recommendations. Document Released: 08/03/2001 Document Revised: 10/02/2012 Document Reviewed: 11/02/2010 [facility] Patient Information 2014 [place], [place].

## 2014-08-01 NOTE — Progress Notes (Signed)
Pre visit review using our clinic review tool, if applicable. No additional management support is needed unless otherwise documented below in the visit note. 

## 2014-08-01 NOTE — Progress Notes (Signed)
Subjective:     [name] is a 29 y.o. female and is here for a comprehensive physical exam. The patient reports problems - difficult to control weight, fatigue, emotional, irregular MC.  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 She has ptosis & ovarian failure. She has strong fam Hx: father, sister & brother. Weight gain is part of syndrome. She has seen a nutritionist, but did not feel she gained new info. She feels very tired, emotional, & withdrawn. Gyn has put her on metformin for PCOS. Fertility specialist-Dr Serena Colonel [facility]) recently attempted egg harvesting but treatment did not result in mature ovarian follicles. Preve care: vaccines UTD.  She is not/never sexually actv & wants to start gardisil series. 1 Pap 10/'15 was nml. History   Social History  . Marital Status: Single    Spouse Name: N/A  . Number of Children: 0  . Years of Education: N/A   Occupational History  . 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 Smoking status: Never Smoker   . Smokeless tobacco: Never Used  . Alcohol Use: 0.0 oz/week    0 Standard drinks or equivalent per week  . Drug Use: No  . Sexual Activity: No   Other Topics Concern  . Not on file   Social History Narrative   Ms [name] lives alone. 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   Health Maintenance  Topic Date Due  . TETANUS/TDAP  09/12/2006  . INFLUENZA VACCINE  01/20/2015  . PAP SMEAR  03/31/2017    The following portions of the patient's history were reviewed and updated as appropriate: allergies, current medications, past family history, past medical history, past social history, past surgical history and problem list.  Review of Systems Constitutional: negative for fevers Eyes: positive for contacts/glasses, visual disturbance and watering eyes secondary to dry eye syndrome-part of BPES Ears, nose, mouth, throat, and face:  negative for nasal congestion and sore throat Respiratory: negative for cough Cardiovascular: negative for lower extremity edema and palpitations Gastrointestinal: negative for abdominal pain, change in bowel habits, constipation, diarrhea and dyspepsia Genitourinary:positive for abnormal menstrual periods Integument/breast: negative for rash Musculoskeletal:positive for arthralgias and knee pain Behavioral/Psych: negative for excessive alcohol consumption, illegal drug usage, sleep disturbance and tobacco use Endocrine: negative for temperature intolerance   Objective:    BP 120/80 mmHg  Pulse 74  Temp(Src) 97.8 F (36.6 C) (Oral)  Ht 5' 8.5" (1.74 m)  Wt 361 lb (163.749 kg)  BMI 54.09 kg/m2  SpO2 99%  LMP 08/02/2013 General appearance: alert, cooperative, appears stated age, mild distress and tearful at times Head: Normocephalic, without obvious abnormality, atraumatic Eyes: negative findings: conjunctivae and sclerae normal, corneas clear and pupils equal, round, reactive to light and accomodation, positive findings: bilat ptosis Ears: normal TM's and external ear canals both ears Throat: lips, mucosa, and tongue normal; teeth and gums normal Neck: no adenopathy, no carotid bruit, supple, symmetrical, trachea midline and thyroid: enlarged and NT, no focal nodules palpated Lungs: clear to auscultation bilaterally Heart: regular rate and rhythm, S1, S2 normal, no murmur, click, rub or gallop Abdomen: soft, non-tender; bowel sounds normal; no masses,  no organomegaly and obese Extremities: extremities normal, atraumatic, no cyanosis or edema Pulses: 2+ and symmetric Neurologic: Grossly normal    Assessment:Plan     1. Preventative health care - Comprehensive metabolic panel - CBC with Differential/Platelet - Lipid  panel - TSH - T4, free - Thyroid peroxidase antibody - Urine Microscopic - Vit D  25 hydroxy (rtn osteoporosis monitoring)  2. Other fatigue, new -  Comprehensive metabolic panel - CBC with Differential/Platelet - Lipid panel - TSH - T4, free - Thyroid peroxidase antibody - Antinuclear Antibodies, IFA - Vit D  25 hydroxy (rtn osteoporosis monitoring) - Vitamin B12  3. Need for HPV vaccination #1 admin today - HPV 9-valent vaccine,Recombinat  4. 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 Severe obesity (BMI >= 40), unstable Specific diet changes & exercise plan discussed & written instructions given  F/u 6 weeks  7. Goiter, new Thyroid studies today Plan for neck US  F/u 6 weeks or sooner PRN labs

## 2014-08-02 LAB — THYROID PEROXIDASE ANTIBODY: Thyroperoxidase Ab SerPl-aCnc: 1 IU/mL (ref <9)

## 2014-08-02 LAB — ANTINUCLEAR ANTIBODIES, IFA: ANA TITER 1: NEGATIVE

## 2014-08-03 ENCOUNTER — Telehealth: Payer: Self-pay | Admitting: Nurse Practitioner

## 2014-08-03 NOTE — Telephone Encounter (Signed)
pls call pt: Advise Labs look good-no thyroid disease. ANA is negative-this screens for autoimmine disease. 2 concerns: vit d too low. Start 5000 iu D3 daily for 12 weeks, then check again. B12 is low-may be contributing to fatigue. Start weekly B12 injections for 4 weeks. Ask if she wants to come here or self-administer. Pls schedule OV in 4 weeks rather than 6 wks-make last B12 inj an OV.

## 2014-08-05 ENCOUNTER — Encounter: Payer: Self-pay | Admitting: Nurse Practitioner

## 2014-08-06 ENCOUNTER — Other Ambulatory Visit: Payer: Self-pay | Admitting: Nurse Practitioner

## 2014-08-06 DIAGNOSIS — E010 Iodine-deficiency related diffuse (endemic) goiter: Secondary | ICD-10-CM

## 2014-08-06 DIAGNOSIS — E538 Deficiency of other specified B group vitamins: Secondary | ICD-10-CM

## 2014-08-06 MED ORDER — CYANOCOBALAMIN 1000 MCG/ML IJ SOLN: INTRAMUSCULAR | Status: DC

## 2014-08-06 NOTE — Progress Notes (Signed)
Patient notified. Patient said that cone wound be best please for her.

## 2014-08-06 NOTE — Telephone Encounter (Signed)
Patient notified of results. Patient expressed understanding. Patient stated that she prefers to self-administer b-12 injections. Patient ssaid that will also need the needles and syringes.

## 2014-08-07 ENCOUNTER — Ambulatory Visit (HOSPITAL_COMMUNITY)
Admission: RE | Admit: 2014-08-07 | Discharge: 2014-08-07 | Disposition: A | Discharge status: Home / Self Care | Payer: 59 | Source: Ambulatory Visit | Attending: Nurse Practitioner | Admitting: Nurse Practitioner

## 2014-08-07 DIAGNOSIS — E010 Iodine-deficiency related diffuse (endemic) goiter: Secondary | ICD-10-CM | POA: Diagnosis not present

## 2014-08-08 NOTE — Telephone Encounter (Signed)
Please contact patient with [place] results, she called yesterday & today to check to see if the results are available

## 2014-08-08 NOTE — Telephone Encounter (Signed)
Please advise results? 

## 2014-08-08 NOTE — Telephone Encounter (Signed)
Patient notified.of results. Patient stated that she has not had enough energy to exercise, however she is trying. Layne aware.

## 2014-08-08 NOTE — Telephone Encounter (Signed)
pls call pt: Advise US reveals thyroid to be enlarged, but no discrete nodules or masses, which is reassuring. I am considering sending her to endocrinology. I will discuss with her further at return appointment. Ask how diet changes & exercise is going.

## 2014-08-13 ENCOUNTER — Telehealth: Payer: Self-pay | Admitting: Nurse Practitioner

## 2014-08-13 NOTE — Telephone Encounter (Signed)
Pt c/o extreme fatigue-difficult to get through ADL's & 12 hour shift at work. She has started B12 injections-had 1, next is tomorrow. Taking Vit D supplement. Offered anti-depressant trial, offered phentermine for wt loss & help w/energy. She will consider. Has OV f/u in 2weeks.

## 2014-08-27 ENCOUNTER — Encounter (HOSPITAL_COMMUNITY): Payer: Self-pay | Admitting: Emergency Medicine

## 2014-08-27 ENCOUNTER — Emergency Department (HOSPITAL_COMMUNITY)
Admission: EM | Admit: 2014-08-27 | Discharge: 2014-08-27 | Disposition: A | Discharge status: Home / Self Care | Payer: 59 | Source: Home / Self Care | Attending: Family Medicine | Admitting: Family Medicine

## 2014-08-27 ENCOUNTER — Encounter (INDEPENDENT_AMBULATORY_CARE_PROVIDER_SITE_OTHER): Payer: Self-pay

## 2014-08-27 ENCOUNTER — Encounter: Payer: Self-pay | Admitting: Nurse Practitioner

## 2014-08-27 ENCOUNTER — Ambulatory Visit (INDEPENDENT_AMBULATORY_CARE_PROVIDER_SITE_OTHER): Payer: 59 | Admitting: Nurse Practitioner

## 2014-08-27 DIAGNOSIS — E538 Deficiency of other specified B group vitamins: Secondary | ICD-10-CM

## 2014-08-27 DIAGNOSIS — E049 Nontoxic goiter, unspecified: Secondary | ICD-10-CM

## 2014-08-27 DIAGNOSIS — M791 Myalgia: Secondary | ICD-10-CM

## 2014-08-27 DIAGNOSIS — M7918 Myalgia, other site: Secondary | ICD-10-CM

## 2014-08-27 MED ORDER — DICLOFENAC SODIUM 75 MG PO TBEC: 75.0000 mg | DELAYED_RELEASE_TABLET | Freq: Two times a day (BID) | ORAL | Status: DC

## 2014-08-27 MED ORDER — METHOCARBAMOL 500 MG PO TABS: 500.0000 mg | ORAL_TABLET | Freq: Four times a day (QID) | ORAL | Status: DC | PRN
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
## 2014-08-27 NOTE — Progress Notes (Signed)
Pre visit review using our clinic review tool, if applicable. No additional management support is needed unless otherwise documented below in the visit note. 
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
## 2014-08-27 NOTE — Patient Instructions (Signed)
Continue metformin 500 mg qd.  Start liquid oral B complex to get 2400 mcg B12 daily.  On your days off, walk for 30 minutes.   Learn to cook to avoid the pitfalls of high calorie restaurant foods. Consider watching Food Network or you tube for recipes for 2-that way you have leftovers for next day.  Consider using phentermine-it will help energy & that may help get some weight off, which will likely help you feel better.  Continue vitamin D for 8 more weeks.  Let's see how you feel in 2 weeks.
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
## 2014-08-28 ENCOUNTER — Ambulatory Visit: Payer: 59 | Admitting: Nurse Practitioner

## 2014-09-02 DIAGNOSIS — D519 Vitamin B12 deficiency anemia, unspecified: Secondary | ICD-10-CM | POA: Insufficient documentation

## 2014-09-02 NOTE — Assessment & Plan Note (Signed)
Unsuccessful w/recommended diet changes-gained 2 lbs since last OV. Eats out a lot-doesn't know how to cook. Learn how to cook few things to avoid boxed & restaurant foods that are high calore. Soups & fresh foods salads, fruits, vegetables, nuts, hummus are option. Must walk 30 minutes on days off-10 min increments if needed. Discussed phentermine. Pt does not want to use. F/u 2 weeks.

## 2014-09-02 NOTE — Assessment & Plan Note (Signed)
Start oral B complex F/u 2 weeks for B12 level

## 2014-09-02 NOTE — Progress Notes (Signed)
Subjective:     [name] is a 29 y.o. female. She presents for f/u B12 deficicency, wt loss management, enlarged thyroid. B12: lebel checked due to c/o extreme fatigue. Level-262. Energy has slightly improved w/ B12 injection-she completed 4 weekly injections. Had a few days at work that she didn't think she could get through 12 hr shift. She has not exercised due to fatigue. She has mild anemia-hgb 11.9. Denies paresthesia. C/o mood changes when she should have [place] (Dx'd w/PCOS-has irreg MC. Has BPES.) Thyroid: US reveals slightly heterogenous thyroid, no discrete nodules. TSH nml.  Morbid obesity: struggled w/wt since puberty. Eats out a lot-doesn't cook. Not exercising due to low energy. Discussed food options, consider watching ot taking cooking classes. Gave several suggestions. Discussed pentermine pros & cons, potential SE. Pt declines for now.   The following portions of the patient's history were reviewed and updated as appropriate: allergies, current medications, past medical history, past social history, past surgical history and problem list.  Review of Systems Constitutional: negative for fevers and night sweats Cardiovascular: negative for palpitations Gastrointestinal: negative for diarrhea Neurological: negative for headaches Behavioral/Psych: positive for mood swings, negative for aggressive behavior, anxiety, bad mood, illegal drug usage, irritability, sleep disturbance and tobacco use Endocrine: negative for diabetic symptoms including polydipsia, polyphagia and polyuria and temperature intolerance    Objective:    BP 110/64 mmHg  Pulse 83  Temp(Src) 97.3 F (36.3 C) (Oral)  Ht 5\' 8"  (1.727 m)  Wt 363 lb (164.656 kg)  BMI 55.21 kg/m2  SpO2 97%  LMP 06/21/2013 BP 110/64 mmHg  Pulse 83  Temp(Src) 97.3 F (36.3 C) (Oral)  Ht 5\' 8"  (1.727 m)  Wt 363 lb (164.656 kg)  BMI 55.21 kg/m2  SpO2 97%  LMP 06/21/2013 General appearance: alert, cooperative, appears  stated age and no distress Head: Normocephalic, without obvious abnormality, atraumatic Eyes: negative findings: conjunctivae and sclerae normal and wearing glasses, positive findings: ptosis bilat Neurologic: Grossly normal    Assessment:plan     1. Severe obesity (BMI >= 40) Diet changes Exercise Consider phentermine  2. Vitamin B 12 deficiency Completed 4 weekly B12 inj Start oral complex F/u 2 weeks B12 level.  3. Goiter nml TSH Heterogenous thyroid, no nodules Monitor TSH q 3-6 mos Consider endo ref  See problem list for complete A&P See pt instructions. F/u 2 weeks  Spent 25 Minutes with patient, 50% or more was spent in counseling.

## 2014-09-10 ENCOUNTER — Ambulatory Visit (INDEPENDENT_AMBULATORY_CARE_PROVIDER_SITE_OTHER): Payer: 59 | Admitting: Nurse Practitioner

## 2014-09-10 ENCOUNTER — Encounter: Payer: Self-pay | Admitting: Nurse Practitioner

## 2014-09-10 VITALS — BP 110/64 | Temp 98.2°F | Ht 68.0 in | Wt 358.0 lb

## 2014-09-10 DIAGNOSIS — Z23 Encounter for immunization: Secondary | ICD-10-CM | POA: Diagnosis not present

## 2014-09-10 DIAGNOSIS — N926 Irregular menstruation, unspecified: Secondary | ICD-10-CM | POA: Diagnosis not present

## 2014-09-10 DIAGNOSIS — Z6841 Body Mass Index (BMI) 40.0 and over, adult: Secondary | ICD-10-CM | POA: Diagnosis not present

## 2014-09-10 DIAGNOSIS — D519 Vitamin B12 deficiency anemia, unspecified: Secondary | ICD-10-CM

## 2014-09-10 LAB — VITAMIN B12: Vitamin B-12: 1008 pg/mL — ABNORMAL HIGH (ref 211–911)

## 2014-09-10 LAB — CBC
HCT: 36.3 % (ref 36.0–46.0)
HEMOGLOBIN: 11.8 g/dL — AB (ref 12.0–15.0)
MCHC: 32.4 g/dL (ref 30.0–36.0)
MCV: 80.5 fl (ref 78.0–100.0)
Platelets: 273 10*3/uL (ref 150.0–400.0)
RBC: 4.51 Mil/uL (ref 3.87–5.11)
RDW: 16.1 % — AB (ref 11.5–15.5)
WBC: 6.9 10*3/uL (ref 4.0–10.5)

## 2014-09-10 LAB — HEMOGLOBIN A1C: Hgb A1c MFr Bld: 5.7 % (ref 4.6–6.5)

## 2014-09-10 NOTE — Patient Instructions (Addendum)
My office will call with lab results and follow up.  Great JOB with increasing activity & diet changes!!! Keep up the tremendous work!  Continue with diet changes: Learn how to cook few things to avoid boxed & restaurant foods that are high calore. Soups & fresh foods, salads, fruits, vegetables, nuts, hummus are always heathy options!  Follow up with gynecology. Address concerns regarding endometrial cancer risk.  Congrats on the new house & school plans!
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
## 2014-09-10 NOTE — Progress Notes (Signed)
Pre visit review using our clinic review tool, if applicable. No additional management support is needed unless otherwise documented below in the visit note. 

## 2014-09-11 ENCOUNTER — Ambulatory Visit: Payer: 59 | Admitting: Nurse Practitioner

## 2014-09-11 ENCOUNTER — Telehealth: Payer: Self-pay | Admitting: Nurse Practitioner

## 2014-09-11 NOTE — Telephone Encounter (Signed)
pls call pt: Advise B12 level is therapeutic-continue current oral dose B complex. Hgb is 11.8, so it has not been affected by B 12. A1C is 5.7, which is in prediabetes range. Continue metformin at current dose. I will check A1c again in 3 mos. It will likely normalize with weight loss. Keep appt in 4 weeks.

## 2014-09-11 NOTE — Telephone Encounter (Signed)
Called and informed patient of results.

## 2014-10-08 ENCOUNTER — Ambulatory Visit: Payer: 59 | Admitting: Nurse Practitioner

## 2014-10-09 ENCOUNTER — Ambulatory Visit: Payer: 59 | Admitting: Nurse Practitioner

## 2014-10-16 ENCOUNTER — Ambulatory Visit (INDEPENDENT_AMBULATORY_CARE_PROVIDER_SITE_OTHER): Payer: 59 | Admitting: Nurse Practitioner

## 2014-10-16 ENCOUNTER — Encounter: Payer: Self-pay | Admitting: Nurse Practitioner

## 2014-10-16 VITALS — BP 115/75 | HR 86 | Temp 97.6°F | Ht 68.0 in | Wt 355.0 lb

## 2014-10-16 DIAGNOSIS — R7303 Prediabetes: Secondary | ICD-10-CM | POA: Insufficient documentation

## 2014-10-16 DIAGNOSIS — D519 Vitamin B12 deficiency anemia, unspecified: Secondary | ICD-10-CM | POA: Diagnosis not present

## 2014-10-16 DIAGNOSIS — Z6841 Body Mass Index (BMI) 40.0 and over, adult: Secondary | ICD-10-CM

## 2014-10-16 DIAGNOSIS — E559 Vitamin D deficiency, unspecified: Secondary | ICD-10-CM | POA: Insufficient documentation

## 2014-10-16 DIAGNOSIS — Q103 Other congenital malformations of eyelid: Secondary | ICD-10-CM

## 2014-10-16 DIAGNOSIS — R7309 Other abnormal glucose: Secondary | ICD-10-CM | POA: Diagnosis not present

## 2014-10-16 NOTE — Assessment & Plan Note (Signed)
Referred to provider referral exercise program

## 2014-10-16 NOTE — Progress Notes (Signed)
Subjective:     [name] is a 29 y.o. female returns for ongoing wt management, prediabetes, vit D & B12 def. 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  wt management: lost another 3 lbs in 4 weeks. She is not exercising as much as before due to "busy". She is continuing to make diet changes. We discussed LT wt loss goal, length of time, calorie limit, exercise goals. We discussed appetite suppressants, she does not wish to use them. Accountability is helpful. [facility] She wishes to persue.   Prediabetes: A1C 5.7. She has been taking 1500 mg metformin for about 4 mos-prescribed by gyn for PCOS. I decreased to 500 mg qd 1 mo ago. Diarrhea has resolved. I will check A1c again in 2 mos. She will continue to limit refined sugars & grains. vit D: Taking prescription D w/out intol SE. Will recheck level in 2 mos. B12 def: Energy has greatly improved with inj & now has been on oral B complex for about 6 weeks. Continues to enjoy improved energy. BPES: Considering re-starting fertility treatments to harvest eggs. She did this last fall & had mood swings including depression. SHe had MC this mo-has 1 cycle about q3-4 mos. She will start school in fall & continue to work FT.    The following portions of the patient's history were reviewed and updated as appropriate: allergies, current medications, past medical history, past social history, past surgical history and problem list.  Review of Systems Pertinent items are noted in HPI.    Objective:    BP 115/75 mmHg  Pulse 86  Temp(Src) 97.6 F (36.4 C) (Oral)  Ht 5\' 8"  (1.727 m)  Wt 355 lb (161.027 kg)  BMI 53.99 kg/m2  SpO2 97%  LMP 10/07/2014 BP 115/75 mmHg  Pulse 86  Temp(Src) 97.6 F (36.4 C) (Oral)  Ht 5\' 8"  (1.727 m)  Wt 355 lb (161.027 kg)  BMI 53.99 kg/m2  SpO2 97%  LMP 10/07/2014 General appearance: alert, cooperative, appears stated age and no  distress Head: Normocephalic, without obvious abnormality, atraumatic Eyes: negative findings: conjunctivae and sclerae normal, positive findings: eyelids/periorbital: ptosis bilaterally Neurologic: Grossly normal    Assessment:Plan   1. BMI 50.0-59.9, adult Lost another 3 lbs. Goal: 150 lbs/BMI of 30; Rate lose 2lbs/week, 18 mos. Ref to Provider ref ex program Continue diet changes  2. BPES syndrome 3. B12 deficiency anemia 4. Vitamin D deficiency 5.Prediabetes Continue metformin 500 mg qd. Decreased from 1500 mg qd Diet & exercis changes discussed  See problem list for complete A&P See pt instructions. F/u 2 mos-a1c, b12, d, wt

## 2014-10-16 NOTE — Progress Notes (Signed)
Pre visit review using our clinic review tool, if applicable. No additional management support is needed unless otherwise documented below in the visit note. 

## 2014-10-16 NOTE — Assessment & Plan Note (Signed)
Considering re-starting fert treatments in fall She will f/u w/fert specialist

## 2014-10-16 NOTE — Assessment & Plan Note (Signed)
Continue oral Bcomplex Check level in 2 mos

## 2014-10-16 NOTE — Patient Instructions (Signed)
Great job with diet & exercise changes! Weight loss goal is 150  pounds. This will take about 18  months. If you want to count calories, limit to  2200  calories daily if you are exercising 1 to 3 times week.  Helpful phone app for calorie counting is "Go Meals". Consider reading Eat to Live by Excell Seltzer and begin implementing principles for best human nutrition & health.  As discussed,cut out refined sugar- anything that is sweet when you eat or drink it except fresh fruit.  Cut out refined grains- bread, rolls, biscuits, bagels, muffins, pasta and cereals that have less than 4 grams fiber per serving.  Limit animal fats & proteins to 3 to 4 times/week. Consider brewing green tea & drink 1-3 cups daily to increase metabolism.  Increase exercise to 5 days week: at least 30 minutes. The benefits include weight loss, lower risk for heart disease, diabetes, stroke, high blood pressure, lower rates of depression & dementia, better sleep quality & bone health.  Vanita Ingles from [facility] will contact you about exercise program.  Continue Vit d & B complex. Please return in 8 weeks.

## 2014-12-16 ENCOUNTER — Ambulatory Visit: Payer: 59 | Admitting: Nurse Practitioner

## 2014-12-31 ENCOUNTER — Ambulatory Visit: Payer: 59 | Admitting: Nurse Practitioner

## 2015-01-08 ENCOUNTER — Other Ambulatory Visit: Payer: Self-pay | Admitting: Obstetrics and Gynecology

## 2015-01-15 ENCOUNTER — Ambulatory Visit: Payer: 59 | Admitting: Nurse Practitioner

## 2015-01-16 ENCOUNTER — Ambulatory Visit: Payer: 59 | Admitting: Nurse Practitioner

## 2015-01-28 ENCOUNTER — Encounter: Payer: Self-pay | Admitting: Family Medicine

## 2015-01-28 ENCOUNTER — Ambulatory Visit (INDEPENDENT_AMBULATORY_CARE_PROVIDER_SITE_OTHER): Payer: 59 | Admitting: Family Medicine

## 2015-01-28 ENCOUNTER — Ambulatory Visit: Payer: 59 | Admitting: Family Medicine

## 2015-01-28 VITALS — BP 123/82 | HR 75 | Temp 98.3°F | Resp 16 | Ht 68.0 in | Wt 366.0 lb

## 2015-01-28 DIAGNOSIS — Z0000 Encounter for general adult medical examination without abnormal findings: Secondary | ICD-10-CM

## 2015-01-28 DIAGNOSIS — E049 Nontoxic goiter, unspecified: Secondary | ICD-10-CM | POA: Diagnosis not present

## 2015-01-28 DIAGNOSIS — D519 Vitamin B12 deficiency anemia, unspecified: Secondary | ICD-10-CM

## 2015-01-28 DIAGNOSIS — R7309 Other abnormal glucose: Secondary | ICD-10-CM | POA: Diagnosis not present

## 2015-01-28 DIAGNOSIS — Z23 Encounter for immunization: Secondary | ICD-10-CM | POA: Diagnosis not present

## 2015-01-28 DIAGNOSIS — E559 Vitamin D deficiency, unspecified: Secondary | ICD-10-CM

## 2015-01-28 DIAGNOSIS — R7303 Prediabetes: Secondary | ICD-10-CM

## 2015-01-28 DIAGNOSIS — E282 Polycystic ovarian syndrome: Secondary | ICD-10-CM | POA: Diagnosis not present

## 2015-01-28 DIAGNOSIS — Z6841 Body Mass Index (BMI) 40.0 and over, adult: Secondary | ICD-10-CM

## 2015-01-28 MED ORDER — METFORMIN HCL ER (MOD) 1000 MG PO TB24: 1000.0000 mg | ORAL_TABLET | Freq: Every day | ORAL | Status: DC

## 2015-01-28 NOTE — Assessment & Plan Note (Signed)
   Obesity: Discussed goals with patient today. Patient is motivated to try to lose weight. TSH repeated today. Metformin dose increased with extended-release formation to help with decreased side effects. Discussed goals of 150 minutes a week exercise, patient will attempt to work her up to 150 minutes. Discussed just starting, finding a gym or something she likes to do, at least starting at 75 minutes a week. Patient is to have vegetables be at least half of her meals/plate. Patient set a goal of not having more than 2 fast food stops during one week. Meal preparation is key for her, she is going to work harder on preparing meals one day during the week so that she has meals to come home to.   Discussed with patient as long as she's following up with her endocrine and her OB/GYN, one-year follow-up here, unless her labs come back abnormal or she needs to see her sooner.

## 2015-01-28 NOTE — Assessment & Plan Note (Signed)
-   Vitamin D deficiency:  Her last vitamin D was 08/01/2014 in which her vitamin D was 17. Started on 50,000 units a week of vitamin D. Vitamin D collected today, patient will be called with results once they are available.  Vitamin B deficiency: Vitamin D level collected today, patient will be called with results once they are available.

## 2015-01-28 NOTE — Progress Notes (Signed)
Subjective:    Patient ID: RAYNEISHA BOUZA, female    DOB: 1985-08-29, 28 y.o.   MRN: [id number]  HPI  Vitamin D deficiency: Patient was noted to have vitamin D deficiency. Her last vitamin D was 08/01/2014 in which her vitamin D was 17. Started on 50,000 units a week of vitamin D. She is here today to be retested. Patient states since starting her supplementations, she has had increasing energy.  Vitamin B deficiency: She had a history of vitamin B deficiency, and February 2016 her vitamin D was 262. At that time she was given IM injections, retested in March her vitamin B 12 was 1008. Patient has continued to take oral supplementation, she believes she takes 2000 mg a day. Patient was asked to follow-up for recheck on vitamin B levels this month. Patient denies any symptoms. She states she has felt her energy level go up increasingly every month since starting her supplementations.  PCO S/BPES syndrome: Patient has a history of polycystic ovarian syndrome and BPES. She is now referred to an endocrinologist in [place] gynec[place]. Her last A1c was 5.26 August 2014. Patient currently takes metformin 500 mg a day, this was decreased from 1500 mg a day secondary to GI upset/diarrhea. Patient continues to have irregular periods. She attempted have her eggs harvested, but they were unable to find a mature egg to harvest. She is noted to have a thickening lining of her uterus, and her gynecologist recently started her on Provera. She continues to follow with them regularly.  Obesity: Patient continues to gain weight, she has gained 11 pounds since April 2016. Patient states she's very concerned about her weight gain, and doesn't feel like she has control over. Her TSH was normal in February 2016. Patient does have PCO S is on very minimal dose of metformin. A1c was 5.7 in March 2016. Patient states she tried to go to a gym that was near her, but she recently moved and now she feels like she has to restart the  process of finding a gym. She admits secondary to demanding schedule she does eat fast food frequently throughout the week for convenience. She has cut out soda completely.  Health maintenance: Patient states that she is due for her third HPV vaccination. It appears she is also due for her tetanus shot.  Review of Systems     Objective:   Physical Exam BP 123/82 mmHg  Pulse 75  Temp(Src) 98.3 F (36.8 C) (Oral)  Resp 16  Ht 5\' 8"  (1.727 m)  Wt 366 lb (166.017 kg)  BMI 55.66 kg/m2  SpO2 99%  LMP 11/12/2014 Gen: NAD. Nontoxic in appearance, well-developed, well-nourished, morbidly obese Caucasian female. HEENT: AT. [place]. Bilateral TM visualized and normal in appearance. Bilateral eyes without injections or icterus. MMM. Bilateral nares without erythema. Throat without erythema or exudates.  Neck: Supple, mild thyromegaly, no lymphadenopathy CV: RRR no murmurs appreciated Chest: CTAB, no wheeze or crackles Abd: Soft. Morbidly obese. NTND. BS present. No Masses palpated.  Ext: No erythema. No edema. +2/4 PT     Assessment & Plan:  DARCIE MELLONE is a 29 y.o. female present to office visit for follow-up on vitamin deficiencies, obesity and PCO S - Vitamin D deficiency:  Her last vitamin D was 08/01/2014 in which her vitamin D was 17. Started on 50,000 units a week of vitamin D. Vitamin D collected today, patient will be called with results once they are available.  Vitamin B deficiency: Vitamin D level  collected today, patient will be called with results once they are available.  PCO S/BPES syndrome: Patient now seen in OB/GYN and endocrinologist. Endocrine appointment is not until November. Discussed metformin with her today. Her doses extremely low at 500 mg a day, this likely is not giving her much benefit for her issue. Will give a trial of extended release metformin 1000 mg to see if side effects occur. Patient to follow-up if side effects, diarrhea, GI upset become an issue.  Otherwise she can continue to be managed with her endocrine and/or OB/GYN.  Obesity: Discussed goals with patient today. Patient is motivated to try to lose weight. TSH repeated today. Metformin dose increased with extended-release formation to help with decreased side effects. Discussed goals of 150 minutes a week exercise, patient will attempt to work her up to 150 minutes. Discussed just starting, finding a gym or something she likes to do, at least starting at 75 minutes a week. Patient is to have vegetables be at least half of her meals/plate. Patient set a goal of not having more than 2 fast food stops during one week. Meal preparation is key for her, she is going to work harder on preparing meals one day during the week so that she has meals to come home to.  Health maintenance: 3rd HPV vaccination given today.  Discussed with patient as long as she's following up with her endocrine and her OB/GYN, one-year follow-up here, unless her labs come back abnormal or she needs to see her sooner.

## 2015-01-28 NOTE — Assessment & Plan Note (Signed)
TSH collected today

## 2015-01-28 NOTE — Progress Notes (Signed)
Pre visit review using our clinic review tool, if applicable. No additional management support is needed unless otherwise documented below in the visit note. 

## 2015-01-28 NOTE — Assessment & Plan Note (Signed)
-   Vitamin D deficiency:  Her last vitamin D was 08/01/2014 in which her vitamin D was 17. Started on 50,000 units a week of vitamin D. Vitamin D collected today, patient will be called with results once they are available.  Vitamin B deficiency: Vitamin D level collected today, patient will be called with results once they are available.  PCO S/BPES syndrome: Patient now seen in OB/GYN and endocrinologist. Endocrine appointment is not until November. Discussed metformin with her today. Her doses extremely low at 500 mg a day, this likely is not giving her much benefit for her issue. Will give a trial of extended release metformin 1000 mg to see if side effects occur. Patient to follow-up if side effects, diarrhea, GI upset become an issue. Otherwise she can continue to be managed with her endocrine and/or OB/GYN.  Obesity: Discussed goals with patient today. Patient is motivated to try to lose weight. TSH repeated today. Metformin dose increased with extended-release formation to help with decreased side effects. Discussed goals of 150 minutes a week exercise, patient will attempt to work her up to 150 minutes. Discussed just starting, finding a gym or something she likes to do, at least starting at 75 minutes a week. Patient is to have vegetables be at least half of her meals/plate. Patient set a goal of not having more than 2 fast food stops during one week. Meal preparation is key for her, she is going to work harder on preparing meals one day during the week so that she has meals to come home to.  Health maintenance: 3rd HPV vaccination given today.  Discussed with patient as long as she's following up with her endocrine and her OB/GYN, one-year follow-up here, unless her labs come back abnormal or she needs to see her sooner.

## 2015-01-28 NOTE — Assessment & Plan Note (Signed)
-   Vitamin D deficiency:  Her last vitamin D was 08/01/2014 in which her vitamin D was 17. Started on 50,000 units a week of vitamin D. Vitamin D collected today, patient will be called with results once they are available.  Vitamin B deficiency: Vitamin D level collected today, patient will be called with results once they are available.     Discussed with patient as long as she's following up with her endocrine and her OB/GYN, one-year follow-up here, unless her labs come back abnormal or she needs to see her sooner.

## 2015-01-28 NOTE — Assessment & Plan Note (Signed)
Health maintenance: 3rd HPV vaccination given today.  Discussed with patient as long as she's following up with her endocrine and her OB/GYN, one-year follow-up here, unless her labs come back abnormal or she needs to see her sooner.

## 2015-01-28 NOTE — Patient Instructions (Signed)
I will call with you with the results once they become available. We will make adjustments if needed once the results are available. We will start metformin XR 1000 mg a day. This will hopefully help with the side effects.  I will see you in one year unless you need me sooner, or want to discuss nutrition/exercise.  Exercise 150 minutes a week (or what you can tolerate weekly). Just start and work up. Continue to watch your diet. Avoid fast food (allow 2x a week), frozen veggies make up at least 1/2 your meal.

## 2015-01-29 LAB — VITAMIN B12

## 2015-01-29 LAB — HEMOGLOBIN A1C: HEMOGLOBIN A1C: 5.6 % (ref 4.6–6.5)

## 2015-01-29 LAB — VITAMIN D 25 HYDROXY (VIT D DEFICIENCY, FRACTURES): VITD: 31.6 ng/mL (ref 30.00–100.00)

## 2015-01-29 LAB — TSH: TSH: 1.2 u[IU]/mL (ref 0.35–4.50)

## 2015-01-30 ENCOUNTER — Other Ambulatory Visit: Payer: Self-pay | Admitting: Family Medicine

## 2015-01-30 ENCOUNTER — Telehealth: Payer: Self-pay | Admitting: Family Medicine

## 2015-01-30 DIAGNOSIS — E559 Vitamin D deficiency, unspecified: Secondary | ICD-10-CM

## 2015-01-30 MED ORDER — VITAMIN D (ERGOCALCIFEROL) 1.25 MG (50000 UNIT) PO CAPS: 50000.0000 [IU] | ORAL_CAPSULE | ORAL | Status: DC

## 2015-01-30 NOTE — Telephone Encounter (Signed)
Please call pt: - Her vitamin D is still low (31- low normal), but it has improved from last collection (17). I have called in another round of supplement at 50000iu a week. She will take one pill every 7 days, for 12 weeks. We will then need to test her vitamin D again. - TSH, thyroid, was normal. - Vitamin B is well supplemented >1500. She could decrease her dose, or take M-F only.  - She will also be due for her tetanus shot in 2017.  We can recheck her vitamin D in December and update her tetanus at that time. Please encourage her to make an appointment.  Thanks

## 2015-01-30 NOTE — Telephone Encounter (Signed)
Pt aware of results and new medication recommendations.  Pt scheduled appointment in Dec.

## 2015-02-13 ENCOUNTER — Ambulatory Visit: Payer: 59 | Admitting: Internal Medicine

## 2015-03-23 ENCOUNTER — Encounter (HOSPITAL_COMMUNITY): Payer: Self-pay | Admitting: *Deleted

## 2015-03-23 ENCOUNTER — Emergency Department (INDEPENDENT_AMBULATORY_CARE_PROVIDER_SITE_OTHER): Payer: 59

## 2015-03-23 ENCOUNTER — Emergency Department (HOSPITAL_COMMUNITY)
Admission: EM | Admit: 2015-03-23 | Discharge: 2015-03-23 | Disposition: A | Discharge status: Home / Self Care | Payer: 59 | Source: Home / Self Care | Attending: Emergency Medicine | Admitting: Emergency Medicine

## 2015-03-23 DIAGNOSIS — M5442 Lumbago with sciatica, left side: Secondary | ICD-10-CM

## 2015-03-23 MED ORDER — KETOROLAC TROMETHAMINE 60 MG/2ML IM SOLN
60.0000 mg | Freq: Once | INTRAMUSCULAR | Status: AC
  Administered 2015-03-23: 60 mg via INTRAMUSCULAR

## 2015-03-23 MED ORDER — MELOXICAM 15 MG PO TABS: 15.0000 mg | ORAL_TABLET | Freq: Every day | ORAL | Status: DC

## 2015-03-23 MED ORDER — HYDROCODONE-ACETAMINOPHEN 5-325 MG PO TABS: 1.0000 | ORAL_TABLET | Freq: Four times a day (QID) | ORAL | Status: DC | PRN

## 2015-03-23 MED ORDER — KETOROLAC TROMETHAMINE 60 MG/2ML IM SOLN
INTRAMUSCULAR | Status: AC
  Filled 2015-03-23: qty 2

## 2015-03-23 MED ORDER — PREDNISONE 50 MG PO TABS: ORAL_TABLET | ORAL | Status: DC

## 2015-03-23 NOTE — ED Notes (Signed)
Pt     Reports   Back pain  For  1-2  Months          Pt  Reports       Pain  Worse  Today   Pt is  Sitting  Upright on the  Exam table  Speaking in   Complete  sentances  Pain is  Worse  On  Movement  And  Certain     posistions

## 2015-03-23 NOTE — ED Provider Notes (Signed)
CSN: [id number]     Arrival date & time 03/23/15  1426 History   First MD Initiated Contact with Patient 03/23/15 1547     Chief Complaint  Patient presents with  . Back Pain   (Consider location/radiation/quality/duration/timing/severity/associated sxs/prior Treatment) HPI  She is a 29 year old woman here for evaluation of low back pain. She states this started about 6 weeks ago. It is located at the base of her spine. It will radiate into both of her legs, primarily the left leg. It mostly goes into the thigh. She does report some intermittent numbness in her butt cheeks. She states slouching is particularly painful. She also reports pain and some difficulty in first standing. She denies any injury or trauma. 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 She states she has had a history of intermittent back pain, but it typically resolves on its own within a few days. She has tried Flexeril without improvement.  Past Medical History  Diagnosis Date  . PCOS (polycystic ovarian syndrome)   . BPES syndrome    Past Surgical History  Procedure Laterality Date  . Eye surgery    . Eye surgery Bilateral    Family History  Problem Relation Age of Onset  . Hyperlipidemia Father   . Hypertension Father   . Heart disease Father   . Other Father     BPES  . Other Brother     BPES  . Other Sister     BPES  . Other Paternal Uncle     BPES  . Other Cousin     BPES  . Other Cousin     BPES  . Other Other     BPES  . Other Other     BPES  . Hyperlipidemia Mother   . Hypertension Mother   . Sudden death Father     Age 17   Social History  Substance Use Topics  . Smoking status: Never Smoker   . Smokeless tobacco: Never Used  . Alcohol Use: 0.0 oz/week    0 Standard drinks or equivalent per week   OB History    No data available     Review of Systems As in history of present illness Allergies  Review of patient's allergies  indicates no known allergies.  Home Medications   Prior to Admission medications   Medication Sig Start Date End Date Taking? Authorizing Provider  Cyanocobalamin 2500 MCG SUBL Place 2,400 mcg under the tongue.    Historical Provider, MD  HYDROcodone-acetaminophen (NORCO) 5-325 MG tablet Take 1 tablet by mouth every 6 (six) hours as needed for moderate pain. 03/23/15   Melony Overly, MD  medroxyPROGESTERone (PROVERA) 5 MG tablet Take 5 mg by mouth daily. Take first 12 days of every month    Historical Provider, MD  meloxicam (MOBIC) 15 MG tablet Take 1 tablet (15 mg total) by mouth daily. 03/23/15   Melony Overly, MD  metFORMIN (GLUMETZA) 1000 MG (MOD) 24 hr tablet Take 1 tablet (1,000 mg total) by mouth daily with breakfast. 01/28/15   Renee A Kuneff, DO  predniSONE (DELTASONE) 50 MG tablet Take 1 pill daily for 5 days. 03/23/15   Melony Overly, MD  Vitamin D, Ergocalciferol, (DRISDOL) 50000 UNITS CAPS capsule Take 1 capsule (50,000 Units total) by mouth every 7 (seven) days. 01/30/15   Renee A Kuneff, DO   Meds Ordered and Administered this Visit   Medications  ketorolac (TORADOL)  injection 60 mg (60 mg Intramuscular Given 03/23/15 1622)    BP 172/84 mmHg  Pulse 112  Temp(Src) 98 F (36.7 C) (Oral)  Resp 20  SpO2 99%  LMP 02/08/2015 No data found.   Physical Exam  Constitutional: She is oriented to person, place, and time. She appears well-developed and well-nourished. She appears distressed (tearful with exam).  Neck: Neck supple.  Cardiovascular:  Tachycardic  Pulmonary/Chest: Effort normal.  Musculoskeletal:  Back: No erythema or edema. No vertebral step-offs. She is tender over the lower lumbar spine and the right SI area. Positive straight leg raise on the left. 5 out of 5 strength in plantar flexion and dorsiflexion. Slight weakness in hip flexion, but this may be due to pain.  Neurological: She is alert and oriented to person, place, and time.    ED Course  Procedures  (including critical care time)  Labs Review Labs Reviewed - No data to display  Imaging Review Dg Lumbar Spine Complete  03/23/2015   CLINICAL DATA:  Low back pain for 1-2 months.  EXAM: LUMBAR SPINE - COMPLETE 4+ VIEW  COMPARISON:  None.  FINDINGS: There are 5 nonrib bearing lumbar-type vertebral bodies.  The vertebral body heights are maintained.  The alignment is anatomic. There is no spondylolysis. There is no static listhesis.  There is no acute fracture.  There is degenerative disc disease at L1-2 and to a lesser degree L5-S1.  The SI joints are unremarkable.  IMPRESSION: Degenerative disc disease at L1-2 and L5-S1.   Electronically Signed   By: Kathreen Devoid   On: 03/23/2015 16:18     MDM   1. Midline low back pain with left-sided sciatica    Treat with prednisone, meloxicam, and Norco. Out of work for one day. Follow-up with orthopedics if not improving in the next week.  Blood pressure elevation is likely secondary to pain. She has had multiple normal readings in the last year.    Melony Overly, MD 03/23/15 [phone]

## 2015-03-23 NOTE — Discharge Instructions (Signed)
You have some degenerative changes in your back. This likely got flared up and is impinging on some nerves. Take prednisone daily for the next 5 days. Take meloxicam daily for 7 days, then as needed. Use the Norco every 6 hours as needed for severe pain. If things are not improving in the next week, please follow-up with orthopedics.

## 2015-03-28 ENCOUNTER — Telehealth: Payer: Self-pay | Admitting: Family Medicine

## 2015-03-28 DIAGNOSIS — M5442 Lumbago with sciatica, left side: Secondary | ICD-10-CM

## 2015-03-28 NOTE — Telephone Encounter (Signed)
New problem for her.  She was just seen at [facility] and has went through muscle relaxer and prednisone without much relief.

## 2015-03-28 NOTE — Telephone Encounter (Signed)
Pt was seen at [facility].  She states she is in a lot of back pain still.  She just wants a referral to [facility] with Dr. Arnoldo Morale.  She spoke with this office and can possibly be seen Monday.  Please advise if you can put referral order in.   [facility] patient )

## 2015-03-28 NOTE — Telephone Encounter (Signed)
OK, order entered as per pt request.

## 2015-03-28 NOTE — Telephone Encounter (Signed)
Will order referral but I need to know if this is for chronic low back pain or is this a new problem for her? Has she tried physical therapy for the problem? Let me know-thx

## 2015-04-25 ENCOUNTER — Telehealth: Payer: Self-pay | Admitting: *Deleted

## 2015-04-25 ENCOUNTER — Encounter: Payer: Self-pay | Admitting: Family Medicine

## 2015-04-25 ENCOUNTER — Ambulatory Visit (INDEPENDENT_AMBULATORY_CARE_PROVIDER_SITE_OTHER): Payer: 59 | Admitting: Family Medicine

## 2015-04-25 VITALS — BP 127/82 | HR 83 | Temp 97.9°F | Resp 20 | Ht 69.0 in | Wt 369.0 lb

## 2015-04-25 DIAGNOSIS — M5430 Sciatica, unspecified side: Secondary | ICD-10-CM | POA: Diagnosis not present

## 2015-04-25 DIAGNOSIS — M545 Low back pain: Secondary | ICD-10-CM

## 2015-04-25 DIAGNOSIS — M5440 Lumbago with sciatica, unspecified side: Secondary | ICD-10-CM

## 2015-04-25 MED ORDER — CYCLOBENZAPRINE HCL 10 MG PO TABS: 10.0000 mg | ORAL_TABLET | Freq: Three times a day (TID) | ORAL | Status: DC | PRN

## 2015-04-25 MED ORDER — HYDROCODONE-ACETAMINOPHEN 10-325 MG PO TABS: 1.0000 | ORAL_TABLET | Freq: Three times a day (TID) | ORAL | Status: DC | PRN

## 2015-04-25 MED ORDER — PREDNISONE 20 MG PO TABS: 20.0000 mg | ORAL_TABLET | Freq: Every day | ORAL | Status: DC

## 2015-04-25 MED ORDER — MELOXICAM 15 MG PO TABS: 15.0000 mg | ORAL_TABLET | Freq: Every day | ORAL | Status: DC

## 2015-04-25 NOTE — Telephone Encounter (Signed)
Patient seen today for back pain . She called and left message asking if you think she should see a chiropractor. Please Advise

## 2015-04-25 NOTE — Telephone Encounter (Signed)
I do not think she should see a chiropractor until we settle down her back pain and get MRI.

## 2015-04-25 NOTE — Telephone Encounter (Signed)
Spoke with patient advised her at this time it is not recommended for her to see chiropractor. Patient verbalized understanding.

## 2015-04-25 NOTE — Progress Notes (Signed)
Subjective:    Patient ID: [name], female    DOB: 07-03-1985, 29 y.o.   MRN: [id number]  HPI  Low back pain with radiation: Patient was seen in the emergency room approximately 4 weeks ago for low back pain, that started 6 weeks prior. At that time and imaging study was completed which showed degenerative disc disease L-1-2 and L5-S1. Patient was prescribed prednisone, Mobic and Norco. Patient states that her pain improved initially after starting the steroid. She was seen by Dr. Arnoldo Morale, neurology, who evaluated her when her pain was better. He stated that he would order physical therapy. Patient states she did not receive a call to set up her physical therapy as of yet. Patient states that her pain has been progressing over the last few days, and today it hurt to get out of bed. She states that she felt such a sharp pain at the base of her spine it felt like her entire lower back was spasming on both sides which brought her to immediate tears. She states that her pain is located at the base of her spine and will radiate to both of her legs but primarily left leg. She reports mild intermittent numbness bilateral buttocks. She states the pain is worse when getting up in the morning, slouching, difficulty with standing. Patient states that her pain had always been better with lying down, but this has also increased the last few days and she is unable to lie flat without lifting her left leg. She has denied any injury or trauma in the past were acutely. She is a Marine scientist, and does do patient lifting, but she states she does not recall injuring herself prior to this event. She has tried Flexeril and heating pads also in the past without great improvement. Patient denies any saddle anesthesia, bowel or bladder changes/incontinence.  Past Medical History  Diagnosis Date  . PCOS (polycystic ovarian syndrome)   . BPES syndrome    No Known Allergies Past Surgical History  Procedure Laterality Date    . Eye surgery    . Eye surgery Bilateral    Social History   Social History  . Marital Status: Single    Spouse Name: N/A  . Number of Children: 0  . Years of Education: N/A   Occupational History  . 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 Smoking status: Never Smoker   . Smokeless tobacco: Never Used  . Alcohol Use: 0.0 oz/week    0 Standard drinks or equivalent per week  . Drug Use: No  . Sexual Activity: No   Other Topics Concern  . Not on file   Social History Narrative   [name] lives alone. 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   Review of Systems Negative, with the exception of above mentioned in HPI     Objective:   Physical Exam BP 127/82 mmHg  Pulse 83  Temp(Src) 97.9 F (36.6 C) (Oral)  Resp 20  Ht 5\' 9"  (1.753 m)  Wt 369 lb (167.377 kg)  BMI 54.47 kg/m2  SpO2 99%  LMP 04/25/2015 Gen: Afebrile. Tearful, guarded movements, appears in moderate to severe pain. CV: RRR  MSK/neuro: Lumbar spine, no erythema, no soft tissue swelling, no tissue texture changes. No vertebral step-offs. Tenderness L3 through sacral area. Tenderness over left SI joint. Moderate to severe pain with flexion, with reproduction of left  leg pain. Moderate to severe Pain with left side bending. Discomfort only with bilateral rotation and extension. Positive left straight leg raise test. Negative FABRE bilaterally. 5/5 bilateral lower extremity plantar flexion and dorsiflexion of feet. 4+/5 with reproduction pain on left hip flexion. Neurovascularly intact distally.     Assessment & Plan:  1. Back pain of lumbar region with sciatica - Discussed in detail with patient the need for her to rest, continue heating application, prescribed muscle relaxer, narcotic and steroid. Considering patient's continued pain, that is worsening with radicular signs on exam, I have also ordered an MRI to be completed. - cyclobenzaprine (FLEXERIL)  10 MG tablet; Take 1 tablet (10 mg total) by mouth 3 (three) times daily as needed for muscle spasms.  Dispense: 30 tablet; Refill: 0 - HYDROcodone-acetaminophen (NORCO) 10-325 MG tablet; Take 1 tablet by mouth every 8 (eight) hours as needed.  Dispense: 30 tablet; Refill: 0 - predniSONE (DELTASONE) 20 MG tablet; Take 1 tablet (20 mg total) by mouth daily with breakfast. 60 mg x3 days, 40 mg x3 days, 20 mg x2 days, 10 mg x2 days  Dispense: 18 tablet; Refill: 0 - MR Lumbar Spine Wo Contrast; Future - Follow-up one week

## 2015-04-25 NOTE — Patient Instructions (Signed)
Back Pain, Adult °Back pain is very common in adults. The cause of back pain is rarely dangerous and the pain often gets better over time. The cause of your back pain may not be known. Some common causes of back pain include: °· Strain of the muscles or ligaments supporting the spine. °· Wear and tear (degeneration) of the spinal disks. °· Arthritis. °· Direct injury to the back. °For many people, back pain may return. Since back pain is rarely dangerous, most people can learn to manage this condition on their own. °HOME CARE INSTRUCTIONS °Watch your back pain for any changes. The following actions may help to lessen any discomfort you are feeling: °· Remain active. It is stressful on your back to sit or stand in one place for long periods of time. Do not sit, drive, or stand in one place for more than 30 minutes at a time. Take short walks on even surfaces as soon as you are able. Try to increase the length of time you walk each day. °· Exercise regularly as directed by your health care provider. Exercise helps your back heal faster. It also helps avoid future injury by keeping your muscles strong and flexible. °· Do not stay in bed. Resting more than 1-2 days can delay your recovery. °· Pay attention to your body when you bend and lift. The most comfortable positions are those that put less stress on your recovering back. Always use proper lifting techniques, including: °· Bending your knees. °· Keeping the load close to your body. °· Avoiding twisting. °· Find a comfortable position to sleep. Use a firm mattress and lie on your side with your knees slightly bent. If you lie on your back, put a pillow under your knees. °· Avoid feeling anxious or stressed. Stress increases muscle tension and can worsen back pain. It is important to recognize when you are anxious or stressed and learn ways to manage it, such as with exercise. °· Take medicines only as directed by your health care provider. Over-the-counter  medicines to reduce pain and inflammation are often the most helpful. Your health care provider may prescribe muscle relaxant drugs. These medicines help dull your pain so you can more quickly return to your normal activities and healthy exercise. °· Apply ice to the injured area: °· Put ice in a plastic bag. °· Place a towel between your skin and the bag. °· Leave the ice on for 20 minutes, 2-3 times a day for the first 2-3 days. After that, ice and heat may be alternated to reduce pain and spasms. °· Maintain a healthy weight. Excess weight puts extra stress on your back and makes it difficult to maintain good posture. °SEEK MEDICAL CARE IF: °· You have pain that is not relieved with rest or medicine. °· You have increasing pain going down into the legs or buttocks. °· You have pain that does not improve in one week. °· You have night pain. °· You lose weight. °· You have a fever or chills. °SEEK IMMEDIATE MEDICAL CARE IF:  °· You develop new bowel or bladder control problems. °· You have unusual weakness or numbness in your arms or legs. °· You develop nausea or vomiting. °· You develop abdominal pain. °· You feel faint. °  °This information is not intended to replace advice given to you by your health care provider. Make sure you discuss any questions you have with your health care provider. °  °Document Released: 06/07/2005 Document Revised: 06/28/2014 Document Reviewed: 10/09/2013 °Elsevier Interactive Patient Education ©2016 Elsevier   Inc.  Pian meds, NSAIDS, heat application, steroids, rest. We have placed an order for MRI to evaluate, this may take a little time to set up. If take too long will send to spine specialist.

## 2015-05-07 ENCOUNTER — Ambulatory Visit (HOSPITAL_COMMUNITY): Admission: RE | Admit: 2015-05-07 | Payer: 59 | Source: Ambulatory Visit

## 2015-05-07 ENCOUNTER — Telehealth: Payer: Self-pay | Admitting: *Deleted

## 2015-05-07 NOTE — Telephone Encounter (Signed)
Patient called to ask if she needed to get MRI scheduled today . She states she is pain free and has been for 4 days now without any pain medications. Spoke with Dr Raoul Pitch she states if patient is still having pain she needs to get MRI and make follow up appt. Patient states she is pain free and doesnt feel she needs it and  will cancel MRI . She will follow up if anything changes.

## 2015-06-09 ENCOUNTER — Telehealth: Payer: Self-pay | Admitting: Family Medicine

## 2015-06-09 ENCOUNTER — Encounter: Payer: Self-pay | Admitting: Family Medicine

## 2015-06-09 ENCOUNTER — Ambulatory Visit (INDEPENDENT_AMBULATORY_CARE_PROVIDER_SITE_OTHER): Payer: 59 | Admitting: Family Medicine

## 2015-06-09 VITALS — BP 122/86 | HR 73 | Temp 98.3°F | Resp 18 | Ht 69.0 in | Wt 362.0 lb

## 2015-06-09 DIAGNOSIS — Z0000 Encounter for general adult medical examination without abnormal findings: Secondary | ICD-10-CM | POA: Diagnosis not present

## 2015-06-09 DIAGNOSIS — Z23 Encounter for immunization: Secondary | ICD-10-CM | POA: Diagnosis not present

## 2015-06-09 DIAGNOSIS — M545 Low back pain: Secondary | ICD-10-CM

## 2015-06-09 DIAGNOSIS — M5440 Lumbago with sciatica, unspecified side: Secondary | ICD-10-CM

## 2015-06-09 DIAGNOSIS — M5430 Sciatica, unspecified side: Secondary | ICD-10-CM

## 2015-06-09 DIAGNOSIS — E559 Vitamin D deficiency, unspecified: Secondary | ICD-10-CM

## 2015-06-09 LAB — VITAMIN D 25 HYDROXY (VIT D DEFICIENCY, FRACTURES): VITD: 23.81 ng/mL — ABNORMAL LOW (ref 30.00–100.00)

## 2015-06-09 MED ORDER — CHOLECALCIFEROL 1.25 MG (50000 UT) PO CAPS: 50000.0000 [IU] | ORAL_CAPSULE | ORAL | Status: DC

## 2015-06-09 NOTE — Telephone Encounter (Signed)
Patient aware of results and new Rx's at pharmacy.  Pt will f/u with lab visit after she is finished with Rx dose of Vitamin D.

## 2015-06-09 NOTE — Progress Notes (Signed)
Pre visit review using our clinic review tool, if applicable. No additional management support is needed unless otherwise documented below in the visit note. 

## 2015-06-09 NOTE — Patient Instructions (Signed)
A1c in 6 months with appt.  150 min. A week at least exercise.  Myfitnesspal is a great app to help.    We will call you with the results of her vit D , still take at least 600 iu daily.

## 2015-06-09 NOTE — Progress Notes (Signed)
   Subjective:    Patient ID: [name], female    DOB: 1986-05-26, 29 y.o.   MRN: [id number]  HPI  Back pain: Pt states her back pain completely resolved with prednisone burst and pain medications. She has not needed to take any pain medications since the time of event. She did not need to get the MRI, since her symptoms resolved.   Vit D: Pt states she did feel that her vit d supplement provided her with some relief to her fatigue. She has completed her supplement about 2 weeks ago, and feels like she is getting fatigued again. She is not using an OTC supplement.   Immunizations: Flu UTD, tdap indicated.     Past Medical History  Diagnosis Date  . PCOS (polycystic ovarian syndrome)   . BPES syndrome    No Known Allergies  Review of Systems Negative, with the exception of above mentioned in HPI     Objective:   Physical Exam BP 122/86 mmHg  Pulse 73  Temp(Src) 98.3 F (36.8 C) (Temporal)  Resp 18  Ht 5\' 9"  (1.753 m)  Wt 362 lb (164.202 kg)  BMI 53.43 kg/m2  SpO2 99% Gen: Afebrile. No acute distress. Nontoxic in appearance. Well developed, well nourished. Obese, caucasian female. Very pleasant.  HENT: AT. [place]. MMM.  Eyes:Pupils Equal Round Reactive to light, Extraocular movements intact,  Conjunctiva without redness, discharge or icterus. CV: RRR  Chest: CTAB, no wheeze or crackles Neuro: Normal gait. PERLA. EOMi. Alert. Oriented x3 Cranial nerves II through XII intact. Muscle strength 5/5 UE/LE extremity.     Assessment & Plan:  1. Immunization due/preventive measure - Tdap vaccine greater than or equal to 7yo IM  2. Vitamin D deficiency - Vitamin D (25 hydroxy) - Pt encourage to continue at least 600-800 u Vit d daily. If Vit d low on recheck will call in refills.   3. Back pain of lumbar region with sciatica -resolved. - No mri was needed, good resolution with prednisone burst/pain med  F/u 4-6 months a1c

## 2015-06-09 NOTE — Telephone Encounter (Signed)
Please call patient her vitamin D is still too low. I have called in a prescribed medication for her to take 2 times a week for the next 8 weeks, she is then to start 1000 units daily of vitamin D. She is also to have her vitamin D rechecked as soon as she is completely done with prescribed dose. This can be done by lab appointment only. This order has been placed.

## 2015-07-09 DIAGNOSIS — E559 Vitamin D deficiency, unspecified: Secondary | ICD-10-CM | POA: Diagnosis not present

## 2015-07-09 DIAGNOSIS — E049 Nontoxic goiter, unspecified: Secondary | ICD-10-CM | POA: Diagnosis not present

## 2015-07-09 DIAGNOSIS — E282 Polycystic ovarian syndrome: Secondary | ICD-10-CM | POA: Diagnosis not present

## 2015-07-09 DIAGNOSIS — Z6841 Body Mass Index (BMI) 40.0 and over, adult: Secondary | ICD-10-CM | POA: Diagnosis not present

## 2015-07-09 DIAGNOSIS — H02529 Blepharophimosis unspecified eye, unspecified lid: Secondary | ICD-10-CM | POA: Diagnosis not present

## 2015-07-09 DIAGNOSIS — E669 Obesity, unspecified: Secondary | ICD-10-CM | POA: Diagnosis not present

## 2015-07-09 DIAGNOSIS — R7303 Prediabetes: Secondary | ICD-10-CM | POA: Diagnosis not present

## 2015-08-08 ENCOUNTER — Telehealth: Payer: Self-pay | Admitting: Family Medicine

## 2015-08-08 NOTE — Telephone Encounter (Signed)
Spoke with patient informed patient she does need to come in for lab appt when she finishes her current Rx of Vit D. Patient understands she needs to call for an appt.

## 2015-08-08 NOTE — Telephone Encounter (Signed)
Patient has a fu appt in June however she is getting ready to finish up her vitamin D Rx. Does she need to have labs?

## 2015-09-25 ENCOUNTER — Other Ambulatory Visit (INDEPENDENT_AMBULATORY_CARE_PROVIDER_SITE_OTHER): Payer: 59

## 2015-09-25 DIAGNOSIS — E559 Vitamin D deficiency, unspecified: Secondary | ICD-10-CM | POA: Diagnosis not present

## 2015-09-26 LAB — VITAMIN D 25 HYDROXY (VIT D DEFICIENCY, FRACTURES): Vit D, 25-Hydroxy: 24 ng/mL — ABNORMAL LOW (ref 30–100)

## 2015-09-29 ENCOUNTER — Other Ambulatory Visit: Payer: Self-pay | Admitting: Family Medicine

## 2015-09-29 ENCOUNTER — Telehealth: Payer: Self-pay | Admitting: Family Medicine

## 2015-09-29 NOTE — Telephone Encounter (Signed)
Spoke with patient reviewed lab results and instructions. Scheduled patient for OV. Patient was not taking Vit D she will start Vit D3 2000 units daily as directed.

## 2015-09-29 NOTE — Telephone Encounter (Signed)
Please call pt: - her vit D is 24 (would like to see above 30). - We need to discuss possible reasons she is not able to increase her vit D, and collect additional lab work. Please have make an appt.  - make certain she is taking OTC vit D and would encouraged her to take 2000 u daily. Increase yogurt/milk etc in her diet.

## 2015-10-08 ENCOUNTER — Ambulatory Visit: Payer: 59 | Admitting: Family Medicine

## 2015-10-08 ENCOUNTER — Ambulatory Visit (INDEPENDENT_AMBULATORY_CARE_PROVIDER_SITE_OTHER): Payer: 59 | Admitting: Family Medicine

## 2015-10-08 ENCOUNTER — Encounter: Payer: Self-pay | Admitting: Family Medicine

## 2015-10-08 DIAGNOSIS — E559 Vitamin D deficiency, unspecified: Secondary | ICD-10-CM | POA: Diagnosis not present

## 2015-10-08 DIAGNOSIS — R7303 Prediabetes: Secondary | ICD-10-CM | POA: Diagnosis not present

## 2015-10-08 DIAGNOSIS — Z6841 Body Mass Index (BMI) 40.0 and over, adult: Secondary | ICD-10-CM

## 2015-10-08 DIAGNOSIS — E049 Nontoxic goiter, unspecified: Secondary | ICD-10-CM

## 2015-10-08 NOTE — Progress Notes (Signed)
Patient ID: [name], female   DOB: 08/05/1985, 30 y.o.   MRN: [id number]   Subjective:    Patient ID: [name], female    DOB: [birth year], 30 y.o.   MRN: [id number]  HPI   Patient presents for follow-up on vitamin D with new complaints of palpitations. Vit D deficiency: Patient is present for follow-up on her vitamin D deficiency, despite multiple rounds of 50,000 units of vitamin D she still is unable to maintain a vitamin D level that is sufficient. She again has completed her supplementation, she is taking 2000 units vitamin D daily. She states she eats plenty of dairy, and does not understand why she is not able to hold on her vitamin D. She is a prediabetic, possibly diabetics and she is on diabetic medications for other chronic issues (weight loss, PCO S) is uncertain to tell what she is actually true diabetic now. She does not have any malabsorption syndrome, she denies frequent diarrhea, no unintentional weight loss. She does have an enlarged thyroid, with normal thyroid panel, and this has been watched by Dr. Buddy Duty endocrine. She does feel that the thyroid gland is becoming more large than last time it was checked. She does have an upcoming appointment with Dr. Buddy Duty. She denies any numbness or tingling in her extremities, however she has described fatigue. She also now endorses having palpitations, and is concerned about heart disease considering her family history.  Past Medical History  Diagnosis Date  . PCOS (polycystic ovarian syndrome)   . BPES syndrome    Family History  Problem Relation Age of Onset  . Hyperlipidemia Father   . Hypertension Father   . Heart disease Father   . Other Father     BPES  . Other Brother     BPES  . Other Sister     BPES  . Other Paternal Uncle     BPES  . Other Cousin     BPES  . Other Cousin     BPES  . Other Other     BPES  . Other Other     BPES  . Hyperlipidemia Mother   . Hypertension Mother   . Sudden death Father       Age 86    Lipid Panel     Component Value Date/Time   CHOL 188 08/01/2014 0944   TRIG 109.0 08/01/2014 0944   HDL 49.50 08/01/2014 0944   CHOLHDL 4 08/01/2014 0944   VLDL 21.8 08/01/2014 0944   LDLCALC 117* 08/01/2014 0944     No Known Allergies  Review of Systems Negative, with the exception of above mentioned in HPI     Objective:   Physical Exam BP 135/84 mmHg  Pulse 90  Temp(Src) 97.4 F (36.3 C)  Resp 20  Ht 5\' 9"  (1.753 m)  Wt 354 lb 8 oz (160.8 kg)  BMI 52.33 kg/m2  SpO2 99% Gen: Afebrile. No acute distress. Nontoxic in appearance. Well developed, well nourished. Obese, caucasian female. Very pleasant.  HENT: AT. [place]. MMM.  Eyes:Pupils Equal Round Reactive to light, Extraocular movements intact,  Conjunctiva without redness, discharge or icterus. CV: RRR, no murmur appreciated, left carotid bruit appreciated.  Chest: CTAB, no wheeze or crackles Neuro: Normal gait. PERLA. EOMi. Alert. Oriented x3     Assessment & Plan:  Goiter - following with Dr. Buddy Duty for diffuse goiter, reviewed prior thyroid US. Pt feels goiter is larger now.  - TSH - T4, free - Comprehensive metabolic  panel - consider repeat US  BMI 50.0-59.9, adult (HCC)/ Morbid obesity, unspecified obesity type (HCC)/prediabetes - Victoza and metformin prescribed for PCOS and weight loss--> a1c 5.6 a few months ago. Controlled diabetes on medications? Off of meds would she be a diabetic? - Comprehensive metabolic panel PREP program information given to patient   Vitamin D deficiency - continue  Cholecalciferol (VITAMIN D3) 2000 units TABS; Take 2,000 Units by mouth. - PTH, Intact and Calcium - Comprehensive metabolic panel - Despite > 6 months of oral supplement unable to keep D above 30. Discussed possible etiologies with pt today, including diabetes and obesity (see prediabetes below). Pt in a weight loss challenge had has lost 15 lbs 6 months.  - PTH, Intact and Calcium   Right carotid  bruit/palpitations:  - Discussed with pt her significant family h/o of heart disease, father dies 50 of MI - She has PCOS/BPES   - She is morbidly obese and working hard to lose weight . - She is a "prediabetic" although on medications with an a1c of 5.6, which is maybe actually controlled diabetes at this point? - She has reported palpitations, although infrequently.  - She now has an audible right carotid bruit.  - Her lipid panel looks great, with only mildly elevated LDL 117 - May start fish oil supplementation - Would be in favor of early cardiac risk factor reduction/labs etc with all the above variables. Will obtain EKG on follow up, consider carotid doppler (bruit-but asymptomatic), +/- echo depending on exam findings, vs cardio referral  - Discuss statin and ASA use in more detail after CPE and labs (lipids).   Encouraged pt to make appt for CPE and we can obtain further workup on cardiac concerns/FH.  Electronically Signed by: Howard Pouch, DO [facility]

## 2015-10-08 NOTE — Patient Instructions (Signed)
Hypoparathyroidism The parathyroid glands are four tiny glands located next to the thyroid gland in your neck. The glands release parathyroid hormone (PTH). Hypoparathyroidism is a condition in which not enough PTH is produced to keep levels of calcium, phosphorus, and vitamin D balanced in your body. The calcium levels in your blood and bones become too low, and the phosphorus levels get too high. CAUSES Low levels of PTH in your body may be caused by:  Removal of or injury to your parathyroid glands as a result of neck surgery.  DiGeorge syndrome, which can result in the underdevelopment or lack of parathyroid glands.  Autoimmune disease (your body begins to attack and destroy the parathyroid tissue).  High levels of cancer radiation treatment to your neck area.  Low magnesium levels.  Radioactive iodine treatment (this side effect is rare). SIGNS AND SYMPTOMS Symptoms of hypoparathyroidism are listed as follows:  Tingling sensations in your fingertips, toes, and lips.  Twitching spasms in your mouth, hands, arms, or throat.  Abdominal pain.  Muscle aches and cramps.  Tiredness or weakness.  Painful menstrual periods.  Vision problems (cataracts).  Hair loss.  Dry skin.  Brittle nails.  Headaches.  Anxiety.  Depression, mood changes.  Trouble remembering things.  Seizures.  Breathing problems. DIAGNOSIS Hypoparathyroidism is diagnosed through the following:  History and physical exam.  Blood tests.  Urine tests.  X-ray exams or bone density exams to check the health of your bones.  Electrocardiogram to see how your heart is working. TREATMENT Treatment usually includes:  Supplements (calcium and vitamin D) taken by mouth.  Dietary changes to include higher amounts of calcium (dairy, green vegetables, fortified cereals and juice) and reduce phosphorus-rich foods (meats, soda).  Adequate sun exposure if possible (15-20 minutes twice each  week).  Hospitalization and IV infusion of calcium may be necessary (severe cases). You will need to visit your health care provider for regular monitoring of your calcium and phosphorus levels. Weekly blood tests will eventually be reduced to a few visits per year. HOME CARE INSTRUCTIONS  Take supplements as directed.  Visit with a nutritionist to discuss your dietary needs.  Follow your health care provider's instructions for dietary changes.  Get plenty of fiber and water to avoid constipation from calcium supplements.  See your health care provider regularly for tests and adjustments to your treatment.  Watch for symptoms. Contact your health care provider at the first sign of symptoms to avoid complications. SEEK IMMEDIATE MEDICAL CARE IF:  You have a seizure.  You have difficulty breathing.    This information is not intended to replace advice given to you by your health care provider. Make sure you discuss any questions you have with your health care provider.   Document Released: 03/28/2013 Document Revised: 06/12/2013 Document Reviewed: 03/28/2013 Elsevier Interactive Patient Education 2016 Elsevier Inc.  Vitamin D Deficiency Vitamin D deficiency is when your body does not have enough vitamin D. Vitamin D is important to your body for many reasons:  It helps the body to absorb two important minerals, called calcium and phosphorus.  It plays a role in bone health.  It may help to prevent some diseases, such as diabetes and multiple sclerosis.  It plays a role in muscle function, including heart function. You can get vitamin D by:  Eating foods that naturally contain vitamin D.  Eating or drinking milk or other dairy products that have vitamin D added to them.  Taking a vitamin D supplement or a multivitamin  supplement that contains vitamin D.  Being in the sun. Your body naturally makes vitamin D when your skin is exposed to sunlight. Your body changes the  sunlight into a form of the vitamin that the body can use. If vitamin D deficiency is severe, it can cause a condition in which your bones become soft. In adults, this condition is called osteomalacia. In children, this condition is called rickets. CAUSES Vitamin D deficiency may be caused by:  Not eating enough foods that contain vitamin D.  Not getting enough sun exposure.  Having certain digestive system diseases that make it difficult for your body to absorb vitamin D. These diseases include Crohn disease, chronic pancreatitis, and cystic fibrosis.  Having a surgery in which a part of the stomach or a part of the small intestine is removed.  Being obese.  Having chronic kidney disease or liver disease. RISK FACTORS This condition is more likely to develop in:  Older people.  People who do not spend much time outdoors.  People who live in a long-term care facility.  People who have had broken bones.  People with weak or thin bones (osteoporosis).  People who have a disease or condition that changes how the body absorbs vitamin D.  People who have dark skin.  People who take certain medicines, such as steroid medicines or certain seizure medicines.  People who are overweight or obese. SYMPTOMS In mild cases of vitamin D deficiency, there may not be any symptoms. If the condition is severe, symptoms may include:  Bone pain.  Muscle pain.  Falling often.  Broken bones caused by a minor injury. DIAGNOSIS This condition is usually diagnosed with a blood test.  TREATMENT Treatment for this condition may depend on what caused the condition. Treatment options include:  Taking vitamin D supplements.  Taking a calcium supplement. Your health care provider will suggest what dose is best for you. HOME CARE INSTRUCTIONS  Take medicines and supplements only as told by your health care provider.  Eat foods that contain vitamin D. Choices include:  Fortified dairy  products, cereals, or juices. Fortified means that vitamin D has been added to the food. Check the label on the package to be sure.  Fatty fish, such as salmon or trout.  Eggs.  Oysters.  Do not use a tanning bed.  Maintain a healthy weight. Lose weight, if needed.  Keep all follow-up visits as told by your health care provider. This is important. SEEK MEDICAL CARE IF:  Your symptoms do not go away.  You feel like throwing up (nausea) or you throw up (vomit).  You have fewer bowel movements than usual or it is difficult for you to have a bowel movement (constipation).   This information is not intended to replace advice given to you by your health care provider. Make sure you discuss any questions you have with your health care provider.   Document Released: 08/30/2011 Document Revised: 02/26/2015 Document Reviewed: 10/23/2014 Elsevier Interactive Patient Education [employer].

## 2015-10-09 LAB — T4, FREE: FREE T4: 1.09 ng/dL (ref 0.60–1.60)

## 2015-10-09 LAB — COMPREHENSIVE METABOLIC PANEL
ALK PHOS: 65 U/L (ref 39–117)
ALT: 14 U/L (ref 0–35)
AST: 10 U/L (ref 0–37)
Albumin: 3.9 g/dL (ref 3.5–5.2)
BILIRUBIN TOTAL: 0.4 mg/dL (ref 0.2–1.2)
BUN: 12 mg/dL (ref 6–23)
CALCIUM: 9.5 mg/dL (ref 8.4–10.5)
CO2: 31 mEq/L (ref 19–32)
CREATININE: 0.69 mg/dL (ref 0.40–1.20)
Chloride: 102 mEq/L (ref 96–112)
GFR: 107.62 mL/min (ref >60.00)
GLUCOSE: 131 mg/dL — AB (ref 70–99)
Potassium: 4 mEq/L (ref 3.5–5.1)
Sodium: 139 mEq/L (ref 135–145)
TOTAL PROTEIN: 6.8 g/dL (ref 6.0–8.3)

## 2015-10-09 LAB — TSH: TSH: 1.38 u[IU]/mL (ref 0.35–4.50)

## 2015-10-09 LAB — PTH, INTACT AND CALCIUM
Calcium: 9.2 mg/dL (ref 8.4–10.5)
PTH: 35 pg/mL (ref 14–64)

## 2015-10-10 ENCOUNTER — Other Ambulatory Visit: Payer: Self-pay | Admitting: Family Medicine

## 2015-10-10 ENCOUNTER — Telehealth: Payer: Self-pay | Admitting: Family Medicine

## 2015-10-10 ENCOUNTER — Other Ambulatory Visit (INDEPENDENT_AMBULATORY_CARE_PROVIDER_SITE_OTHER): Payer: 59

## 2015-10-10 DIAGNOSIS — E559 Vitamin D deficiency, unspecified: Secondary | ICD-10-CM

## 2015-10-10 LAB — VITAMIN D 25 HYDROXY (VIT D DEFICIENCY, FRACTURES): VITD: 27.73 ng/mL — ABNORMAL LOW (ref 30.00–100.00)

## 2015-10-10 MED ORDER — CHOLECALCIFEROL 125 MCG (5000 UT) PO CAPS: 5000.0000 [IU] | ORAL_CAPSULE | Freq: Every day | ORAL | Status: DC

## 2015-10-10 NOTE — Telephone Encounter (Signed)
Please call patient, her lab results so far are appearing normal. I am still waiting for the vitamin D to return. Over since it was Friday and wanted to go ahead and give her a call before the weekend, and have her set up an appointment next week to discuss the cardiovascular risk/symptoms she was having an anaphylactic to start a workup on her. Until her appointment, I would like her to record any "palpitations" that she has, and make certain she is not taking any stimulants such as caffeine or energy drinks.

## 2015-10-10 NOTE — Telephone Encounter (Signed)
Results for orders placed or performed in visit on 10/10/15 (from the past 24 hour(s))  Vitamin D 25 hydroxy     Status: Abnormal   Collection Time: 10/10/15  1:53 PM  Result Value Ref Range   VITD 27.73 (L) 30.00 - 100.00 ng/mL   Please call pt: - Vit D still mildly low, increasing daily dose to 5000 u daily.

## 2015-10-10 NOTE — Telephone Encounter (Signed)
Spoke with patient reviewed results. Patient will call back to set up appt. Reviewed instructions for no stimulants with patient.

## 2015-10-13 ENCOUNTER — Encounter: Payer: Self-pay | Admitting: Family Medicine

## 2015-10-13 ENCOUNTER — Ambulatory Visit (INDEPENDENT_AMBULATORY_CARE_PROVIDER_SITE_OTHER): Payer: 59 | Admitting: Family Medicine

## 2015-10-13 VITALS — BP 126/87 | HR 76 | Temp 98.2°F | Resp 20 | Wt 355.8 lb

## 2015-10-13 DIAGNOSIS — Z8249 Family history of ischemic heart disease and other diseases of the circulatory system: Secondary | ICD-10-CM

## 2015-10-13 DIAGNOSIS — R002 Palpitations: Secondary | ICD-10-CM | POA: Diagnosis not present

## 2015-10-13 DIAGNOSIS — Z6841 Body Mass Index (BMI) 40.0 and over, adult: Secondary | ICD-10-CM

## 2015-10-13 DIAGNOSIS — E049 Nontoxic goiter, unspecified: Secondary | ICD-10-CM

## 2015-10-13 DIAGNOSIS — R0989 Other specified symptoms and signs involving the circulatory and respiratory systems: Secondary | ICD-10-CM

## 2015-10-13 LAB — LIPID PANEL
CHOLESTEROL: 214 mg/dL — AB (ref 0–200)
HDL: 50.7 mg/dL (ref >39.00)
LDL CALC: 141 mg/dL — AB (ref 0–99)
NonHDL: 162.86
TRIGLYCERIDES: 109 mg/dL (ref 0.0–149.0)
Total CHOL/HDL Ratio: 4
VLDL: 21.8 mg/dL (ref 0.0–40.0)

## 2015-10-13 NOTE — Progress Notes (Signed)
Patient ID: [name], female   DOB: Dec 07, 1985, 30 y.o.   MRN: [id number]   Subjective:    Patient ID: [name], female    DOB: May 02, 1986, 30 y.o.   MRN: [id number]  HPI  Palpitations: patient presents for palpitations and concerns for cardic risk with her family history. Patient reports more frequent palpitations over the last few months. Over the last few weeks she  Has been experiencing them a 3-5 times a week, and she reports they only  Last a few seconds. They do concern her and make her anxious. She reports she gets the sensation to cough when she feels the palpitations, otherwise she denies chest pain, shortness of breath, dizziness or syncope. She reports they happen at all times of the day, and not associated with any particular activity, She is not on any stimulants and does not routinely drink caffeine. She estrogen deficient secondary to PCOS/BPES and prescribed progesterone supplementation. She has a history of goiter, with Neck US last year in February. Thyroid studies, PTH/Ca  normal this month. History of Vit D deficiency, with still mild insuffiencey despite daily supplement.  She does feel that the thyroid gland is becoming more large than last time it was checked. She does have an upcoming appointment with Dr. Buddy Duty. She denies any numbness or tingling in her extremities, however she has described fatigue. She has many cardiac risk factors obesity, diabetes, estrogen deficient, strong family history of early cardiac death in multiple family members.   Past Medical History  Diagnosis Date  . PCOS (polycystic ovarian syndrome)   . BPES syndrome    Family History  Problem Relation Age of Onset  . Hyperlipidemia Father   . Hypertension Father   . Heart disease Father   . Sudden death Father     Age 25  . Other Father     BPES  . Heart attack Father   . Other Brother     BPES  . Other Sister     BPES  . Other Paternal Uncle     BPES  . Other Cousin     BPES    . Other Cousin     BPES  . Other Other     BPES  . Other Other     BPES  . Hyperlipidemia Mother   . Hypertension Mother   . Heart attack Maternal Grandmother   . Heart disease Maternal Grandfather   . Heart disease Paternal Grandfather heartr attack  . Heart attack Paternal Grandfather 36    MI  . Sudden death Paternal Grandfather     Lipid Panel     Component Value Date/Time   CHOL 188 08/01/2014 0944   TRIG 109.0 08/01/2014 0944   HDL 49.50 08/01/2014 0944   CHOLHDL 4 08/01/2014 0944   VLDL 21.8 08/01/2014 0944   LDLCALC 117* 08/01/2014 0944     Medication List       This list is accurate as of: 10/13/15  9:47 AM.  Always use your most recent med list.               Cholecalciferol 5000 units capsule  Take 1 capsule (5,000 Units total) by mouth daily. With meal     Cyanocobalamin 2500 MCG Subl  Place 2,400 mcg under the tongue.     medroxyPROGESTERone 5 MG tablet  Commonly known as:  PROVERA  Take 5 mg by mouth daily. Take first 12 days of every month  metFORMIN 500 MG 24 hr tablet  Commonly known as:  GLUCOPHAGE-XR  Take 2,000 mg by mouth daily.     multivitamin capsule  Take 1 capsule by mouth daily.     SAXENDA 18 MG/3ML Sopn  Generic drug:  Liraglutide -Weight Management     UNIFINE PENTIPS 32G X 4 MM Misc  Generic drug:  Insulin Pen Needle        No Known Allergies  Review of Systems Negative, with the exception of above mentioned in HPI     Objective:   Physical Exam BP 126/87 mmHg  Pulse 76  Temp(Src) 98.2 F (36.8 C) (Oral)  Resp 20  Wt 355 lb 12.8 oz (161.39 kg)  SpO2 97% Gen: Afebrile. No acute distress. Nontoxic in appearance. Well developed, well nourished. Obese, caucasian female. Very pleasant.  HENT: AT. [place]. MMM.  Eyes:Pupils Equal Round Reactive to light, Extraocular movements intact,  Conjunctiva without redness, discharge or icterus.Bilateral ptosis present. CV: RRR, no murmur appreciated, right carotid bruit  appreciated.  Chest: CTAB, no wheeze or crackles Neuro: Normal gait. PERLA. EOMi. Alert. Oriented x3     Assessment & Plan:  Goiter - following with Dr. Buddy Duty for diffuse goiter, reviewed prior thyroid US. Pt feels goiter is larger now, last [place] > 18 year old.   - TSH, T4, free--> normal - repeat US ordered--> follow with Dr. Buddy Duty at scheduled appt in MAY.   Right carotid bruit/palpitations:  - Discussed with pt her significant family h/o of heart disease, father died 41 of MI. MGF and PGF with early MI.  - She has PCOS/BPES --> low estrogen  - She is morbidly obese and working hard to lose weight . - She is a "prediabetic" although on medications with an a1c of 5.6, which is maybe actually controlled diabetes at this point? - She has reported palpitations with sensation to cough with palpitations--> occuring more frequently--> Cardio referral today, echo ordered, EKG normal in office.  - She now has an audible right carotid bruit--> carotid duplex ordered for increase risk.  - start fish oil supplementation - Start ASA  - Lipid panel - Would be in favor of early cardiac risk factor reduction/labs etc with all the above variables.--> cardio referral.    - F/u dependant on results of studies and timing of referral.    Electronically Signed by: Howard Pouch, DO [facility]

## 2015-10-13 NOTE — Patient Instructions (Signed)
-   EKG normal today - start fish oil supplementation - Start ASA  - Lipid panel today - ordered echo, carotid and neck US.  - referred to cardio for palpitations.

## 2015-10-13 NOTE — Telephone Encounter (Signed)
Results to be discussed at scheduled appt on 10/13/15

## 2015-10-14 ENCOUNTER — Ambulatory Visit (HOSPITAL_COMMUNITY)
Admission: RE | Admit: 2015-10-14 | Discharge: 2015-10-14 | Disposition: A | Discharge status: Home / Self Care | Payer: 59 | Source: Ambulatory Visit | Attending: Family Medicine | Admitting: Family Medicine

## 2015-10-14 DIAGNOSIS — I517 Cardiomegaly: Secondary | ICD-10-CM | POA: Diagnosis not present

## 2015-10-14 DIAGNOSIS — R011 Cardiac murmur, unspecified: Secondary | ICD-10-CM | POA: Diagnosis present

## 2015-10-14 DIAGNOSIS — R0989 Other specified symptoms and signs involving the circulatory and respiratory systems: Secondary | ICD-10-CM | POA: Diagnosis not present

## 2015-10-14 DIAGNOSIS — R002 Palpitations: Secondary | ICD-10-CM | POA: Insufficient documentation

## 2015-10-14 DIAGNOSIS — Z8249 Family history of ischemic heart disease and other diseases of the circulatory system: Secondary | ICD-10-CM | POA: Insufficient documentation

## 2015-10-14 NOTE — Progress Notes (Signed)
*  PRELIMINARY RESULTS* Echocardiogram 2D Echocardiogram has been performed.  Leavy Cella 10/14/2015, 3:42 PM

## 2015-10-15 ENCOUNTER — Telehealth: Payer: Self-pay | Admitting: Family Medicine

## 2015-10-15 NOTE — Telephone Encounter (Signed)
Spoke with patient reviewed lab and echo results and complete instructions with patient. Patient verbalized understanding and will follow with cardiology.

## 2015-10-15 NOTE — Telephone Encounter (Signed)
Please call pt: - her cholesterol is higher than desired considering her family history, but not extremely bad. (total 214 and LDL 141) should be below 200 total and 100 ldl, with her fh and her personal history.  - we discussed she start fish oil at her last appt and I would not change that based  on these findings yet. I would like her to speak to her cardiologist about this as well.  - her echo showed very mild thickening or hypertrophy of her left ventricle. Very mild. This is usually seen in individuals with high BP or heart strain (but worse than she currently has). The cardiologist will have access to all of these studies and be able to make specific recommendations. The most important aspect of this, is to attempt to sstop or slow down the progression of hypertrophy.

## 2015-10-15 NOTE — Telephone Encounter (Signed)
Please see prior telephone note 

## 2015-10-20 ENCOUNTER — Ambulatory Visit (HOSPITAL_BASED_OUTPATIENT_CLINIC_OR_DEPARTMENT_OTHER)
Admission: RE | Admit: 2015-10-20 | Discharge: 2015-10-20 | Disposition: A | Discharge status: Home / Self Care | Payer: 59 | Source: Ambulatory Visit | Attending: Family Medicine | Admitting: Family Medicine

## 2015-10-20 ENCOUNTER — Ambulatory Visit (HOSPITAL_COMMUNITY)
Admission: RE | Admit: 2015-10-20 | Discharge: 2015-10-20 | Disposition: A | Discharge status: Home / Self Care | Payer: 59 | Source: Ambulatory Visit | Attending: Family Medicine | Admitting: Family Medicine

## 2015-10-20 DIAGNOSIS — E049 Nontoxic goiter, unspecified: Secondary | ICD-10-CM | POA: Insufficient documentation

## 2015-10-20 DIAGNOSIS — R0989 Other specified symptoms and signs involving the circulatory and respiratory systems: Secondary | ICD-10-CM | POA: Insufficient documentation

## 2015-10-20 DIAGNOSIS — E041 Nontoxic single thyroid nodule: Secondary | ICD-10-CM | POA: Diagnosis not present

## 2015-10-20 NOTE — Progress Notes (Signed)
=  VASCULAR LAB PRELIMINARY  PRELIMINARY  PRELIMINARY  PRELIMINARY  Carotid duplex completed.    Preliminary report:  There is no significant ICA stenosis noted.  Vertebral artery flow is antegrade.   Parissa Chiao, RVT 10/20/2015, 10:04 AM

## 2015-10-27 ENCOUNTER — Telehealth: Payer: Self-pay | Admitting: Family Medicine

## 2015-10-27 NOTE — Telephone Encounter (Addendum)
Please call patient: Her thyroid ultrasound is basically unchanged although a few of the dimensions are mildly different from her last study (including some dimensions being smaller). No nodules were identified. - Patient to continue follow-up with endocrinology - Her carotid Doppler studies did not show any appreciable plaque buildup of concern.

## 2015-10-28 NOTE — Telephone Encounter (Signed)
Spoke with patient reviewed results and instructions. 

## 2015-10-29 DIAGNOSIS — Z6841 Body Mass Index (BMI) 40.0 and over, adult: Secondary | ICD-10-CM | POA: Diagnosis not present

## 2015-10-29 DIAGNOSIS — E049 Nontoxic goiter, unspecified: Secondary | ICD-10-CM | POA: Diagnosis not present

## 2015-10-29 DIAGNOSIS — E559 Vitamin D deficiency, unspecified: Secondary | ICD-10-CM | POA: Diagnosis not present

## 2015-10-29 DIAGNOSIS — E669 Obesity, unspecified: Secondary | ICD-10-CM | POA: Diagnosis not present

## 2015-10-29 DIAGNOSIS — Z5181 Encounter for therapeutic drug level monitoring: Secondary | ICD-10-CM | POA: Diagnosis not present

## 2015-10-29 DIAGNOSIS — R7303 Prediabetes: Secondary | ICD-10-CM | POA: Diagnosis not present

## 2015-10-29 DIAGNOSIS — H02529 Blepharophimosis unspecified eye, unspecified lid: Secondary | ICD-10-CM | POA: Diagnosis not present

## 2015-10-29 DIAGNOSIS — R002 Palpitations: Secondary | ICD-10-CM | POA: Diagnosis not present

## 2015-10-29 DIAGNOSIS — E282 Polycystic ovarian syndrome: Secondary | ICD-10-CM | POA: Diagnosis not present

## 2015-10-31 ENCOUNTER — Ambulatory Visit: Payer: 59 | Admitting: Cardiology

## 2015-10-31 ENCOUNTER — Telehealth: Payer: Self-pay | Admitting: Family Medicine

## 2015-10-31 NOTE — Telephone Encounter (Signed)
Pt called today stating that her cardiologist canceled her appointment for today and then they pushed appointment out another month.  Patient is concerned that is too long to wait for an appointment.  Please advise.

## 2015-11-03 NOTE — Telephone Encounter (Signed)
I would encourage her to express this concern with cardiology. They may be able to work her in if they have a cancellation, or even with a different provider.

## 2015-11-04 NOTE — Telephone Encounter (Signed)
Spoke with patient she states she has asked to be put on cancellation list. She will wait to see if they schedule her sooner.

## 2015-11-11 ENCOUNTER — Ambulatory Visit (INDEPENDENT_AMBULATORY_CARE_PROVIDER_SITE_OTHER): Payer: 59 | Admitting: Cardiovascular Disease

## 2015-11-11 ENCOUNTER — Encounter: Payer: Self-pay | Admitting: Cardiovascular Disease

## 2015-11-11 VITALS — BP 153/90 | HR 92 | Ht 69.0 in | Wt 363.6 lb

## 2015-11-11 DIAGNOSIS — R002 Palpitations: Secondary | ICD-10-CM | POA: Diagnosis not present

## 2015-11-11 NOTE — Progress Notes (Signed)
Cardiology Office Note   Date:  11/11/2015   ID:  [name], DOB [birth year], MRN [id number]  PCP:  Howard Pouch, DO  Cardiologist:   Kathlyn Sacramento, MD   Chief Complaint  Patient presents with  . New Evaluation    pt c/o Palpitation going on for about a mth      History of Present Illness: [name] is a 30 y.o. female who was referred by Dr. Raoul Pitch for evaluation of palpitations. She has no previous cardiac history but has multiple risk factors for ischemic heart disease including morbid obesity, hyperlipidemia, estrogen deficiency and strong family history of premature coronary artery disease. She is not diabetic and there is a lifelong nonsmoker. She works as a Marine scientist at [employer]. She is Estrogin deficient secondary to PCOs/BPES. She also has a history of goiter with normal thyroid studies. She had carotid Doppler done recently which was normal. Recently, she had episodes of palpitations described as skipping in her heart without tachycardia. No dizziness, syncope or presyncope. She denies any chest pain or shortness of breath. She was taking Saxenda for weight loss. The medication was stopped in early May and her symptoms resolved completely.    Past Medical History  Diagnosis Date  . PCOS (polycystic ovarian syndrome)   . BPES syndrome     Past Surgical History  Procedure Laterality Date  . Eye surgery    . Eye surgery Bilateral      Current Outpatient Prescriptions  Medication Sig Dispense Refill  . Cholecalciferol 5000 units capsule Take 1 capsule (5,000 Units total) by mouth daily. With meal 30 capsule 11  . Cyanocobalamin 2500 MCG SUBL Place 2,400 mcg under the tongue.    . medroxyPROGESTERone (PROVERA) 5 MG tablet Take 5 mg by mouth daily. Take first 12 days of every month    . metFORMIN (GLUCOPHAGE-XR) 500 MG 24 hr tablet Take 2,000 mg by mouth daily.  99  . Multiple Vitamin (MULTIVITAMIN) capsule Take 1 capsule by mouth daily.    [name] UNIFINE PENTIPS  32G X 4 MM MISC   11  . SAXENDA 18 MG/3ML SOPN Reported on 11/11/2015  11   No current facility-administered medications for this visit.    Allergies:   Review of patient's allergies indicates no known allergies.    Social History:  The patient  reports that she has never smoked. She has never used smokeless tobacco. She reports that she drinks alcohol. She reports that she does not use illicit drugs.   Family History:  The patient's family history includes Heart attack in her father and maternal grandmother; Heart attack (age of onset: 17) in her paternal grandfather; Heart disease in her father and maternal grandfather; Heart disease (age of onset: heartr attack) in her paternal grandfather; Hyperlipidemia in her father and mother; Hypertension in her father and mother; Other in her brother, cousin, cousin, father, other, other, paternal uncle, and sister; Sudden death in her father and paternal grandfather.    ROS:  Please see the history of present illness.   Otherwise, review of systems are positive for none.   All other systems are reviewed and negative.    PHYSICAL EXAM: VS:  BP 153/90 mmHg  Pulse 92  Ht 5\' 9"  (1.753 m)  Wt 363 lb 9.6 oz (164.928 kg)  BMI 53.67 kg/m2 , BMI Body mass index is 53.67 kg/(m^2). GEN: Well nourished, well developed, in no acute distress HEENT: normal Neck: no JVD, carotid bruits, or masses Cardiac:  RRR; no murmurs, rubs, or gallops,no edema  Respiratory:  clear to auscultation bilaterally, normal work of breathing GI: soft, nontender, nondistended, + BS MS: no deformity or atrophy Skin: warm and dry, no rash Neuro:  Strength and sensation are intact Psych: euthymic mood, full affect   EKG:  EKG is not ordered today. Recent EKG was reviewed which showed normal sinus rhythm with no significant ST or T wave changes. Normal PR and QT intervals   Recent Labs: 10/08/2015: ALT 14; BUN 12; Creatinine, Ser 0.69; Potassium 4.0; Sodium 139; TSH 1.38     Lipid Panel    Component Value Date/Time   CHOL 214* 10/13/2015 0937   TRIG 109.0 10/13/2015 0937   HDL 50.70 10/13/2015 0937   CHOLHDL 4 10/13/2015 0937   VLDL 21.8 10/13/2015 0937   LDLCALC 141* 10/13/2015 0937      Wt Readings from Last 3 Encounters:  11/11/15 363 lb 9.6 oz (164.928 kg)  10/13/15 355 lb 12.8 oz (161.39 kg)  10/08/15 354 lb 8 oz (160.8 kg)         ASSESSMENT AND PLAN:  1.  Palpitations: Based on her description, the symptoms were likely due to premature beats. She reports complete resolution since she stopped taking Saxenda for weight loss. She does not consume excessive amount of caffeine and thyroid studies have been normal. If symptoms recur, I recommend a 48 hour Holter monitor but given that her symptoms have resolved, there is limited utility for obtaining one now. It is reassuring that 2. EKG and echocardiogram were both unremarkable.  2. Hyperlipidemia: Recent LDL was 140. Her 10 year risk of atherosclerotic cardiovascular disease 0.8% which is still considered low but above average for her age. This does not take into account her obesity and family history. We discussed the importance of healthy diet and weight loss. She has no symptoms currently suggestive of angina. Thus, there is very limited utility for stress testing. I suggested that we consider coronary calcium score when she is close to the age of 40.    Disposition:   FU with me as needed.   Signed,  Kathlyn Sacramento, MD  11/11/2015 9:12 AM    [place]

## 2015-11-11 NOTE — Patient Instructions (Signed)

## 2015-11-26 ENCOUNTER — Ambulatory Visit: Payer: 59 | Admitting: Cardiovascular Disease

## 2015-12-08 ENCOUNTER — Ambulatory Visit: Payer: 59 | Admitting: Family Medicine

## 2016-04-16 ENCOUNTER — Encounter: Payer: Self-pay | Admitting: Family Medicine

## 2016-04-16 ENCOUNTER — Ambulatory Visit (INDEPENDENT_AMBULATORY_CARE_PROVIDER_SITE_OTHER): Payer: 59 | Admitting: Family Medicine

## 2016-04-16 VITALS — BP 126/80 | HR 75 | Temp 97.8°F | Resp 12 | Ht 69.0 in | Wt 360.2 lb

## 2016-04-16 DIAGNOSIS — J039 Acute tonsillitis, unspecified: Secondary | ICD-10-CM | POA: Diagnosis not present

## 2016-04-16 DIAGNOSIS — J029 Acute pharyngitis, unspecified: Secondary | ICD-10-CM | POA: Diagnosis not present

## 2016-04-16 LAB — POCT RAPID STREP A (OFFICE): Rapid Strep A Screen: NEGATIVE

## 2016-04-16 LAB — POCT MONO (EPSTEIN BARR VIRUS): Mono, POC: NEGATIVE

## 2016-04-16 MED ORDER — PREDNISONE 20 MG PO TABS: 40.0000 mg | ORAL_TABLET | Freq: Every day | ORAL | 0 refills | Status: AC

## 2016-04-16 NOTE — Patient Instructions (Addendum)
  [name] I have seen you today for an acute visit.  1. Sore throat  - POC Rapid Strep A - POCT Mono (Epstein Barr Virus) Prednisone for 3 days with breakfast/food.  2. Acute tonsillitis, unspecified etiology  - POCT Mono (Epstein Barr Virus) - Culture, Group A Strep  Will let you know about culture.  Symptomatic treatment: Over the counter Acetaminophen 500 mg and/or Ibuprofen (400-600 mg) if there is not contraindications; you can alternate in between both every 4-6 hours. Gargles with saline water and throat lozenges might also help. Cold fluids.    Seek prompt medical evaluation if you are having difficulty breathing, mouth swelling, throat closing up, not able to swallow liquids (drooling), skin rash/bruising, or worsening symptoms.  Please follow up in 2 weeks if not any better.   NO ASPIRIN.  In general please monitor for signs of worsening symptoms and seek immediate medical attention if any concerning/warning symptom as we discussed.   If symptoms are not resolved in a few days/weeks you should schedule a follow up appointment with your doctor, before if needed.  Please be sure you have an appointment already scheduled with your PCP before you leave today.

## 2016-04-16 NOTE — Progress Notes (Signed)
HPI:   ACUTE VISIT:  Chief Complaint  Patient presents with  . Sore Throat    Started wednesday-thursday. Jaw & neck sore    [name] is a 30 y.o.female here today complaining of 2-3 days of respiratory symptoms. Her main concerned is enlarged tonsils, states that pain upon swallowing is "not bad."  Minimal non productive cough. Mild rhinorrhea, sore throat, and post nasal drainage.  She has not noted stridor,chest pain, dyspnea, or wheezing. Subjective fever but checked temp and in normal range. + Chills and fatigue. No Hx of recent travel. 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 No known insect bite.  Hx of allergies: No  OTC medications for this problem: NSAID's, Ibuprofen, last time today morning.  Symptoms otherwise stable.     Review of Systems  Constitutional: Positive for appetite change, chills and fatigue. Negative for activity change and fever.  HENT: Positive for postnasal drip, sore throat and trouble swallowing. Negative for congestion, ear pain, facial swelling, mouth sores, sinus pressure, sneezing and voice change.   Eyes: Negative for discharge, redness and itching.  Respiratory: Positive for cough. Negative for shortness of breath, wheezing and stridor.   Cardiovascular: Negative for chest pain and leg swelling.  Gastrointestinal: Positive for nausea. Negative for abdominal pain, diarrhea and vomiting.  Musculoskeletal: Positive for myalgias. Negative for back pain, joint swelling and neck pain.  Skin: Negative for color change and rash.  Allergic/Immunologic: Negative for environmental allergies.  Neurological: Negative for weakness, numbness and headaches.  Hematological: Negative for adenopathy. Does not bruise/bleed easily.  Psychiatric/Behavioral: Negative for confusion.      Current Outpatient Prescriptions on File Prior to Visit  Medication Sig Dispense Refill  . Cholecalciferol 5000  units capsule Take 1 capsule (5,000 Units total) by mouth daily. With meal 30 capsule 11  . Cyanocobalamin 2500 MCG SUBL Place 2,400 mcg under the tongue.    . metFORMIN (GLUCOPHAGE-XR) 500 MG 24 hr tablet Take 2,000 mg by mouth daily.  99  . Multiple Vitamin (MULTIVITAMIN) capsule Take 1 capsule by mouth daily.    [name] UNIFINE PENTIPS 32G X 4 MM MISC   11   No current facility-administered medications on file prior to visit.      Past Medical History:  Diagnosis Date  . BPES syndrome   . PCOS (polycystic ovarian syndrome)    No Known Allergies  Social History   Social History  . Marital status: Single    Spouse name: N/A  . Number of children: 0  . Years of education: N/A   Occupational History  . 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 Smoking status: Never Smoker  . Smokeless tobacco: Never Used  . Alcohol use 0.0 oz/week  . Drug use: No  . Sexual activity: No   Other Topics Concern  . None   Social History Narrative   Ms [name] lives alone. 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    Vitals:   04/16/16 1035  BP: 126/80  Pulse: 75  Resp: 12  Temp: 97.8 F (36.6 C)    O2 sat 98% at RA.  Body mass index is 53.2 kg/m.   Physical Exam  Nursing note and vitals reviewed. Constitutional: She is oriented to person, place, and time. She appears well-developed. She does not appear ill. No distress.  HENT:  Head: Atraumatic.  Right Ear: Tympanic membrane, external ear and ear canal normal.  Left Ear: Tympanic membrane, external ear and ear canal normal.  Nose: Rhinorrhea and septal deviation present. Right sinus exhibits no maxillary sinus tenderness and no frontal sinus tenderness. Left sinus exhibits no maxillary sinus tenderness and no frontal sinus tenderness.  Mouth/Throat: Uvula is midline and mucous membranes are normal. Oropharyngeal exudate and posterior oropharyngeal erythema present. No tonsillar  abscesses.  Eyes: Conjunctivae are normal.  Neck: No muscular tenderness present. No edema and no erythema present.  Cardiovascular: Normal rate and regular rhythm.   No murmur heard. Respiratory: Effort normal and breath sounds normal. No stridor. No respiratory distress.  Lymphadenopathy:       Head (right side): No submandibular adenopathy present.       Head (left side): No submandibular adenopathy present.    She has cervical adenopathy.  Neurological: She is alert and oriented to person, place, and time. She has normal strength.  Skin: Skin is warm. No rash noted. No erythema.  Psychiatric: She has a normal mood and affect. Her speech is normal.  Well groomed, good eye contact.      ASSESSMENT AND PLAN:     [name] was seen today for sore throat.  Diagnoses and all orders for this visit:  Sore throat -     POC Rapid Strep A -     POCT Mono (Epstein Barr Virus)  Acute tonsillitis, unspecified etiology -     POCT Mono (Epstein Barr Virus) -     Culture, Group A Strep    We discussed possible etiologies, could be viral.  Rapid strep and mono test negative. [name] now I recommend symptomatic treatment and will follow throat Cx for further recommendations. Clearly instructed about warning signs. Prednisone mayn help, some side effects discussed. Note excuse for work.      -[name] advised to return or notify a doctor immediately if symptoms worsen or persist or new concerns arise, she voices understanding.       [name] G. [place], MD  [facility]. [place] office.

## 2016-04-18 ENCOUNTER — Encounter: Payer: Self-pay | Admitting: Family Medicine

## 2016-04-18 LAB — CULTURE, GROUP A STREP: Organism ID, Bacteria: NORMAL

## 2016-05-12 DIAGNOSIS — Z6841 Body Mass Index (BMI) 40.0 and over, adult: Secondary | ICD-10-CM | POA: Diagnosis not present

## 2016-05-12 DIAGNOSIS — Z1159 Encounter for screening for other viral diseases: Secondary | ICD-10-CM | POA: Diagnosis not present

## 2016-05-12 DIAGNOSIS — Z113 Encounter for screening for infections with a predominantly sexual mode of transmission: Secondary | ICD-10-CM | POA: Diagnosis not present

## 2016-05-12 DIAGNOSIS — Z114 Encounter for screening for human immunodeficiency virus [HIV]: Secondary | ICD-10-CM | POA: Diagnosis not present

## 2016-05-12 DIAGNOSIS — Z01419 Encounter for gynecological examination (general) (routine) without abnormal findings: Secondary | ICD-10-CM | POA: Diagnosis not present

## 2016-05-12 DIAGNOSIS — N915 Oligomenorrhea, unspecified: Secondary | ICD-10-CM | POA: Diagnosis not present

## 2016-06-18 ENCOUNTER — Encounter: Payer: Self-pay | Admitting: Family Medicine

## 2016-06-18 ENCOUNTER — Ambulatory Visit (INDEPENDENT_AMBULATORY_CARE_PROVIDER_SITE_OTHER): Payer: 59 | Admitting: Family Medicine

## 2016-06-18 VITALS — BP 136/90 | HR 75 | Temp 98.4°F | Resp 20 | Ht 69.0 in | Wt 365.2 lb

## 2016-06-18 DIAGNOSIS — Z1329 Encounter for screening for other suspected endocrine disorder: Secondary | ICD-10-CM | POA: Diagnosis not present

## 2016-06-18 DIAGNOSIS — Z130 Encounter for screening for diseases of the blood and blood-forming organs and certain disorders involving the immune mechanism: Secondary | ICD-10-CM | POA: Diagnosis not present

## 2016-06-18 DIAGNOSIS — R0683 Snoring: Secondary | ICD-10-CM

## 2016-06-18 DIAGNOSIS — D518 Other vitamin B12 deficiency anemias: Secondary | ICD-10-CM | POA: Diagnosis not present

## 2016-06-18 DIAGNOSIS — Z6841 Body Mass Index (BMI) 40.0 and over, adult: Secondary | ICD-10-CM | POA: Diagnosis not present

## 2016-06-18 DIAGNOSIS — R7303 Prediabetes: Secondary | ICD-10-CM

## 2016-06-18 DIAGNOSIS — E559 Vitamin D deficiency, unspecified: Secondary | ICD-10-CM | POA: Diagnosis not present

## 2016-06-18 DIAGNOSIS — E049 Nontoxic goiter, unspecified: Secondary | ICD-10-CM

## 2016-06-18 DIAGNOSIS — Z0000 Encounter for general adult medical examination without abnormal findings: Secondary | ICD-10-CM | POA: Diagnosis not present

## 2016-06-18 DIAGNOSIS — J351 Hypertrophy of tonsils: Secondary | ICD-10-CM

## 2016-06-18 LAB — COMPREHENSIVE METABOLIC PANEL
ALBUMIN: 3.9 g/dL (ref 3.5–5.2)
ALK PHOS: 59 U/L (ref 39–117)
ALT: 15 U/L (ref 0–35)
AST: 12 U/L (ref 0–37)
BUN: 12 mg/dL (ref 6–23)
CALCIUM: 9.1 mg/dL (ref 8.4–10.5)
CHLORIDE: 105 meq/L (ref 96–112)
CO2: 30 mEq/L (ref 19–32)
CREATININE: 0.63 mg/dL (ref 0.40–1.20)
GFR: 118.94 mL/min (ref >60.00)
Glucose, Bld: 98 mg/dL (ref 70–99)
POTASSIUM: 4.8 meq/L (ref 3.5–5.1)
Sodium: 141 mEq/L (ref 135–145)
TOTAL PROTEIN: 6.5 g/dL (ref 6.0–8.3)
Total Bilirubin: 0.6 mg/dL (ref 0.2–1.2)

## 2016-06-18 LAB — CBC WITH DIFFERENTIAL/PLATELET
BASOS PCT: 0.4 % (ref 0.0–3.0)
Basophils Absolute: 0 10*3/uL (ref 0.0–0.1)
EOS PCT: 3.8 % (ref 0.0–5.0)
Eosinophils Absolute: 0.3 10*3/uL (ref 0.0–0.7)
HEMATOCRIT: 36.6 % (ref 36.0–46.0)
HEMOGLOBIN: 12.1 g/dL (ref 12.0–15.0)
LYMPHS PCT: 25 % (ref 12.0–46.0)
Lymphs Abs: 2.1 10*3/uL (ref 0.7–4.0)
MCHC: 33.1 g/dL (ref 30.0–36.0)
MCV: 81.5 fl (ref 78.0–100.0)
Monocytes Absolute: 0.7 10*3/uL (ref 0.1–1.0)
Monocytes Relative: 8 % (ref 3.0–12.0)
Neutro Abs: 5.4 10*3/uL (ref 1.4–7.7)
Neutrophils Relative %: 62.8 % (ref 43.0–77.0)
Platelets: 296 10*3/uL (ref 150.0–400.0)
RBC: 4.49 Mil/uL (ref 3.87–5.11)
RDW: 15.3 % (ref 11.5–15.5)
WBC: 8.6 10*3/uL (ref 4.0–10.5)

## 2016-06-18 LAB — LIPID PANEL
CHOL/HDL RATIO: 5
Cholesterol: 194 mg/dL (ref 0–200)
HDL: 42.9 mg/dL (ref >39.00)
LDL Cholesterol: 132 mg/dL — ABNORMAL HIGH (ref 0–99)
NONHDL: 151.39
Triglycerides: 96 mg/dL (ref 0.0–149.0)
VLDL: 19.2 mg/dL (ref 0.0–40.0)

## 2016-06-18 LAB — TSH: TSH: 1.58 u[IU]/mL (ref 0.35–4.50)

## 2016-06-18 LAB — VITAMIN B12: VITAMIN B 12: 532 pg/mL (ref 211–911)

## 2016-06-18 LAB — HEMOGLOBIN A1C: HEMOGLOBIN A1C: 5.8 % (ref 4.6–6.5)

## 2016-06-18 LAB — VITAMIN D 25 HYDROXY (VIT D DEFICIENCY, FRACTURES): VITD: 24.02 ng/mL — ABNORMAL LOW (ref 30.00–100.00)

## 2016-06-18 MED ORDER — METFORMIN HCL ER 500 MG PO TB24: 2000.0000 mg | ORAL_TABLET | Freq: Every day | ORAL | 1 refills | Status: DC

## 2016-06-18 NOTE — Progress Notes (Signed)
Patient ID: [name], female  DOB: 10/16/1985, 30 y.o.   MRN: [id number] Patient Care Team    Relationship Specialty Notifications Start End  Ma Hillock, DO PCP - General Family Medicine  04/25/15   Servando Salina, MD Consulting Physician Obstetrics and Gynecology  06/18/16   Wellington Hampshire, MD Consulting Physician Cardiology  06/18/16     Subjective:  [name] is a 30 y.o.  Female  present for CPE. All past medical history, surgical history, allergies, family history, immunizations, medications and social history were Updated in the electronic medical record today. All recent labs, ED visits and hospitalizations within the last year were reviewed.  Enlarged tonsils: Pt states she was seen by another provider 04/16/16 for a sore throat, her strep test was negative. Since that time her tonsils have remained enlarged. She denies pain, fever, chills or swollen glands. She reports her boyfriend told her she started snoring. She understands she is obese and snoring may be from her body habitus, but she feels this is  New. She denies daytime fatigue or falling asleep easily. She does have borderline blood pressures.   Health maintenance:  Colonoscopy: No Fhx. Screen 50 Mammogram: no fhx screen 42 Cervical cancer screening: last pap: 04/2016, results: normal, completed by: Dr. Garwin Gaines Immunizations: tdap 05/2015, Influenza 2017 (encouraged yearly),  Infectious disease screening: HIV UTD DEXA:  Consider early screen with BPES, low vit D Assistive device: None  Oxygen UYQ:IHKV Patient has a Dental home. Hospitalizations/ED visits: none  Immunization History  Administered Date(s) Administered  . HPV 9-valent 08/01/2014, 09/10/2014, 01/28/2015  . Influenza,inj,Quad PF,36+ Mos 03/21/2014  . Influenza-Unspecified 03/22/2015  . Tdap 08/01/2005, 06/09/2015     Past Medical History:  Diagnosis Date  . BPES syndrome   . PCOS (polycystic ovarian syndrome)    No  Known Allergies Past Surgical History:  Procedure Laterality Date  . EYE SURGERY Bilateral    Family History  Problem Relation Age of Onset  . Hyperlipidemia Father   . Hypertension Father   . Heart disease Father   . Sudden death Father     Age 29  . Other Father     BPES  . Heart attack Father   . Other Other     BPES  . Other Other     BPES  . Hyperlipidemia Mother   . Hypertension Mother   . Other Brother     BPES  . Other Sister     BPES  . Other Paternal Uncle     BPES  . Other Cousin     BPES  . Other Cousin     BPES  . Heart attack Maternal Grandmother   . Heart disease Maternal Grandfather   . Heart disease Paternal Grandfather heartr attack  . Heart attack Paternal Grandfather 20    MI  . Sudden death Paternal Grandfather    Social History   Social History  . Marital status: Single    Spouse name: N/A  . Number of children: 0  . Years of education: N/A   Occupational History  . 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 Smoking status: Never Smoker  . Smokeless tobacco: Never Used  . Alcohol use 0.0 oz/week  . Drug use: No  . Sexual activity: No   Other Topics Concern  . Not on file   Social History Narrative   Ms [name] lives alone.  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   Allergies as of 06/18/2016   No Known Allergies     Medication List       Accurate as of 06/18/16 12:20 PM. Always use your most recent med list.          Cholecalciferol 5000 units capsule Take 1 capsule (5,000 Units total) by mouth daily. With meal   Cyanocobalamin 2500 MCG Subl Place 2,400 mcg under the tongue.   HEATHER 0.35 MG tablet Generic drug:  norethindrone   metFORMIN 500 MG 24 hr tablet Commonly known as:  GLUCOPHAGE-XR Take 4 tablets (2,000 mg total) by mouth daily.   multivitamin capsule Take 1 capsule by mouth daily.   UNIFINE PENTIPS 32G X 4 MM Misc Generic drug:  Insulin Pen  Needle      US Thyroid  Result Date: 10/20/2015 CLINICAL DATA:  Followup goiter EXAM: THYROID ULTRASOUND TECHNIQUE: Ultrasound examination of the thyroid gland and adjacent soft tissues was performed. COMPARISON:  None. FINDINGS: Right thyroid lobe Measurements: 6.0 x 2.3 x 2.5 cm. Heterogeneous echotexture without focal mass. Left thyroid lobe Measurements: 5.7 x 2.0 x 2.1 cm. Heterogeneous echotexture without focal mass. Isthmus Thickness: 4 mm.  No nodules visualized. Lymphadenopathy No abnormally enlarged lymph nodes are identified. IMPRESSION: Heterogeneous gland.  No evidence of thyroid nodule. Electronically Signed   By: [name] M.D.   On: 10/20/2015 11:58     ROS: 14 pt review of systems performed and negative (unless mentioned in an HPI)  Objective: BP 136/90 (BP Location: Right Arm, Patient Position: Sitting, Cuff Size: Large)   Pulse 75   Temp 98.4 F (36.9 C)   Resp 20   Ht _0  (1.753 m)   Wt (!) 365 lb 4 oz (165.7 kg)   LMP 04/25/2016 (Exact Date)   SpO2 98%   BMI 53.94 kg/m  Gen: Afebrile. No acute distress. Nontoxic in appearance, well-developed, well-nourished,  Obese, very pleasant caucasian female.  HENT: AT. [place]. Bilateral TM visualized and normal in appearance, normal external auditory canal. MMM, no oral lesions, adequate dentition. Bilateral nares within normal limits. Throat without erythema, ulcerations or exudates. Moderately enlarged tonsils. no Cough on exam, no hoarseness on exam. Eyes:Pupils Equal Round Reactive to light, Extraocular movements intact,  Conjunctiva without redness, discharge or icterus. Neck/lymp/endocrine: Supple,no lymphadenopathy, diffuse  thyromegaly CV: RRR no murmur, no edema, +2/4 P posterior tibialis pulses. no carotid bruits. No JVD. Chest: CTAB, no wheeze, rhonchi or crackles. Normal  Respiratory effort. good Air movement. Abd: Soft. obese. NTND. BS present. no Masses palpated. No hepatosplenomegaly. No rebound tenderness or  guarding. Skin: no rashes, purpura or petechiae. Warm and well-perfused. Skin intact. Neuro/Msk:  Normal gait. PERLA. EOMi. Alert. Oriented x3.  Cranial nerves II through XII intact. Muscle strength 5/5 upper/lower extremity. DTRs equal bilaterally. Psych: Normal affect, dress and demeanor. Normal speech. Normal thought content and judgment.  Assessment/plan: [name] is a 30 y.o. female present for CPE. Preventative health care Patient was encouraged to exercise greater than 150 minutes a week. Patient was encouraged to choose a diet filled with fresh fruits and vegetables, and lean meats. AVS provided to patient today for education/recommendation on gender specific health and safety maintenance. Vitamin D deficiency - Vitamin D (25 hydroxy) Prediabetes - diet and exercise discussed.  - refilled metformin for her today.  - Comp Met (CMET) - HgB A1c - Lipid panel: FHX heart disease - f/u every  6 months on condition.  Morbid obesity (HCC)/BMI 50.0-59.9, adult ([place]) - diet and exercise discussed.  - she has joined a fitness program and is motivated to lose weight.  Other vitamin B12 deficiency anemia - B12 Thyroid disorder screen/Goiter - [place] 2017 normal, diffuse goiter - TSH Screening for deficiency anemia - CBC w/Diff Enlarged tonsils/snoring - discussed sleep apnea/snoring/body habitus and enlarged tonsils today. Pt was given options to proceed with evaluation and she would like to see ENT for eval first.  - Ambulatory referral to ENT   Return in about 1 year (around 06/18/2017) for CPE. 6 months on PCOS/prediabetes  Electronically signed by: Howard Pouch, [place]

## 2016-06-18 NOTE — Patient Instructions (Signed)
It was a pleasure seeing you today.  Follow up yearly with physicals.  Every 6 months for PCOS/metformin prediabetes etc   Please help [place] help you:  It is a privilege to be able to take care of great patients such as yourself. We are honored you have chosen [facility] for your Primary Care home. Below you will find basic instructions that you may need to access in the future. Please help [place] help you by reading the instructions, which cover many of the frequent questions we experience.   Prescription refills and request:  -In order to allow more efficient response time, please call your pharmacy for all refills. They will forward the request electronically to [place]. This allows for the quickest possible response. Request left on a nurse line can take longer to refill, since these are checked as time allows between office patients and other phone calls.  - refill request can take up to 3-5 working days to complete.  - If request is sent electronically and request is appropiate, it is usually completed in 1-2 business days.  - all patients will need to be seen routinely for all chronic medical conditions requiring prescription medications (see follow-up below). If you are overdue for follow up on your condition, you will be asked to make an appointment and we will call in enough medication to cover you until your appointment (up to 30 days).  - all controlled substances will require a face to face visit to request/refill.  - if you desire your prescriptions to go through a new pharmacy, and have an active script at original pharmacy, you will need to call your pharmacy and have scripts transferred to new pharmacy. This is completed between the pharmacy locations and not by your provider.    Results: If any images or labs were ordered, it can take up to 1 week to get results depending on the test ordered and the lab/facility running and resulting the test. - Normal or stable results, which do not  need further discussion, will be released to your mychart immediately with attached note to you. A call will not be generated for normal results. Please make certain to sign up for mychart. If you have questions on how to activate your mychart you can call the front office.  - If your results need further discussion, our office will attempt to contact you via phone, and if unable to reach you after 2 attempts, we will release your abnormal result to your mychart with instructions.  - All results will be automatically released in mychart after 1 week.  - Your provider will provide you with explanation and instruction on all relevant material in your results. Please keep in mind, results and labs may appear confusing or abnormal to the untrained eye, but it does not mean they are actually abnormal for you personally. If you have any questions about your results that are not covered, or you desire more detailed explanation than what was provided, you should make an appointment with your provider to do so.   Our office handles many outgoing and incoming calls daily. If we have not contacted you within 1 week about your results, please check your mychart to see if there is a message first and if not, then contact our office.  In helping with this matter, you help decrease call volume, and therefore allow [place] to be able to respond to patients needs more efficiently.   Acute office visits (sick visit):  An acute  visit is intended for a new problem and are scheduled in shorter time slots to allow schedule openings for patients with new problems. This is the appropriate visit to discuss a new problem. In order to provide you with excellent quality medical care with proper time for you to explain your problem, have an exam and receive treatment with instructions, these appointments should be limited to one new problem per visit. If you experience a new problem, in which you desire to be addressed, please make an acute  office visit, we save openings on the schedule to accommodate you. Please do not save your new problem for any other type of visit, let us take care of it properly and quickly for you.   Follow up visits:  Depending on your condition(s) your provider will need to see you routinely in order to provide you with quality care and prescribe medication(s). Most chronic conditions (Example: hypertension, Diabetes, depression/anxiety... etc), require visits a couple times a year. Your provider will instruct you on proper follow up for your personal medical conditions and history. Please make certain to make follow up appointments for your condition as instructed. Failing to do so could result in lapse in your medication treatment/refills. If you request a refill, and are overdue to be seen on a condition, we will always provide you with a 30 day script (once) to allow you time to schedule.    Medicare wellness (well visit): - we have a wonderful Nurse Maudie Mercury), that will meet with you and provide you will yearly medicare wellness visits. These visits should occur yearly (can not be scheduled less than 1 calendar year apart) and cover preventive health, immunizations, advance directives and screenings you are entitled to yearly through your medicare benefits. Do not miss out on your entitled benefits, this is when medicare will pay for these benefits to be ordered for you.  These are strongly encouraged by your provider and is the appropriate type of visit to make certain you are up to date with all preventive health benefits. If you have not had your medicare wellness exam in the last 12 months, please make certain to schedule one by calling the office and schedule your medicare wellness with Maudie Mercury as soon as possible.   Yearly physical (well visit):  - Adults are recommended to be seen yearly for physicals. Check with your insurance and date of your last physical, most insurances require one calendar year between  physicals. Physicals include all preventive health topics, screenings, medical exam and labs that are appropriate for gender/age and history. You may have fasting labs needed at this visit. This is a well visit (not a sick visit), acute topics should not be covered during this visit.  - Pediatric patients are seen more frequently when they are younger. Your provider will advise you on well child visit timing that is appropriate for your their age. - This is not a medicare wellness visit. Medicare wellness exams do not have an exam portion to the visit. Some medicare companies allow for a physical, some do not allow a yearly physical. If your medicare allows a yearly physical you can schedule the medicare wellness with our nurse Maudie Mercury and have your physical with your provider after, on the same day. Please check with insurance for your full benefits.   Late Policy/No Shows:  - all new patients should arrive 15-30 minutes earlier than appointment to allow [place] time  to  obtain all personal demographics,  insurance information and for you  to complete office paperwork. - All established patients should arrive 10-15 minutes earlier than appointment time to update all information and be checked in .  - In our best efforts to run on time, if you are late for your appointment you will be asked to either reschedule or if able, we will work you back into the schedule. There will be a wait time to work you back in the schedule,  depending on availability.  - If you are unable to make it to your appointment as scheduled, please call 24 hours ahead of time to allow [place] to fill the time slot with someone else who needs to be seen. If you do not cancel your appointment ahead of time, you may be charged a no show fee.   Health Maintenance, Female Introduction Adopting a healthy lifestyle and getting preventive care can go a long way to promote health and wellness. Talk with your health care provider about what schedule of  regular examinations is right for you. This is a good chance for you to check in with your provider about disease prevention and staying healthy. In between checkups, there are plenty of things you can do on your own. Experts have done a lot of research about which lifestyle changes and preventive measures are most likely to keep you healthy. Ask your health care provider for more information. Weight and diet Eat a healthy diet  Be sure to include plenty of vegetables, fruits, low-fat dairy products, and lean protein.  Do not eat a lot of foods high in solid fats, added sugars, or salt.  Get regular exercise. This is one of the most important things you can do for your health.  Most adults should exercise for at least 150 minutes each week. The exercise should increase your heart rate and make you sweat (moderate-intensity exercise).  Most adults should also do strengthening exercises at least twice a week. This is in addition to the moderate-intensity exercise. Maintain a healthy weight  Body mass index (BMI) is a measurement that can be used to identify possible weight problems. It estimates body fat based on height and weight. Your health care provider can help determine your BMI and help you achieve or maintain a healthy weight.  For females 32 years of age and older:  A BMI below 18.5 is considered underweight.  A BMI of 18.5 to 24.9 is normal.  A BMI of 25 to 29.9 is considered overweight.  A BMI of 30 and above is considered obese. Watch levels of cholesterol and blood lipids  You should start having your blood tested for lipids and cholesterol at 27 years of age, then have this test every 5 years.  You may need to have your cholesterol levels checked more often if:  Your lipid or cholesterol levels are high.  You are older than 28 years of age.  You are at high risk for heart disease. Cancer screening Lung Cancer  Lung cancer screening is recommended for adults 2-47  years old who are at high risk for lung cancer because of a history of smoking.  A yearly low-dose CT scan of the lungs is recommended for people who:  Currently smoke.  Have quit within the past 15 years.  Have at least a 30-pack-year history of smoking. A pack year is smoking an average of one pack of cigarettes a day for 1 year.  Yearly screening should continue until it has been 15 years since you quit.  Yearly screening should  stop if you develop a health problem that would prevent you from having lung cancer treatment. Breast Cancer  Practice breast self-awareness. This means understanding how your breasts normally appear and feel.  It also means doing regular breast self-exams. Let your health care provider know about any changes, no matter how small.  If you are in your 20s or 30s, you should have a clinical breast exam (CBE) by a health care provider every 1-3 years as part of a regular health exam.  If you are 56 or older, have a CBE every year. Also consider having a breast X-ray (mammogram) every year.  If you have a family history of breast cancer, talk to your health care provider about genetic screening.  If you are at high risk for breast cancer, talk to your health care provider about having an MRI and a mammogram every year.  Breast cancer gene (BRCA) assessment is recommended for women who have family members with BRCA-related cancers. BRCA-related cancers include:  Breast.  Ovarian.  Tubal.  Peritoneal cancers.  Results of the assessment will determine the need for genetic counseling and BRCA1 and BRCA2 testing. Cervical Cancer  Your health care provider may recommend that you be screened regularly for cancer of the pelvic organs (ovaries, uterus, and vagina). This screening involves a pelvic examination, including checking for microscopic changes to the surface of your cervix (Pap test). You may be encouraged to have this screening done every 3 years,  beginning at age 75.  For women ages 66-65, health care providers may recommend pelvic exams and Pap testing every 3 years, or they may recommend the Pap and pelvic exam, combined with testing for human papilloma virus (HPV), every 5 years. Some types of HPV increase your risk of cervical cancer. Testing for HPV may also be done on women of any age with unclear Pap test results.  Other health care providers may not recommend any screening for nonpregnant women who are considered low risk for pelvic cancer and who do not have symptoms. Ask your health care provider if a screening pelvic exam is right for you.  If you have had past treatment for cervical cancer or a condition that could lead to cancer, you need Pap tests and screening for cancer for at least 20 years after your treatment. If Pap tests have been discontinued, your risk factors (such as having a new sexual partner) need to be reassessed to determine if screening should resume. Some women have medical problems that increase the chance of getting cervical cancer. In these cases, your health care provider may recommend more frequent screening and Pap tests. Colorectal Cancer  This type of cancer can be detected and often prevented.  Routine colorectal cancer screening usually begins at 28 years of age and continues through 28 years of age.  Your health care provider may recommend screening at an earlier age if you have risk factors for colon cancer.  Your health care provider may also recommend using home test kits to check for hidden blood in the stool.  A small camera at the end of a tube can be used to examine your colon directly (sigmoidoscopy or colonoscopy). This is done to check for the earliest forms of colorectal cancer.  Routine screening usually begins at age 19.  Direct examination of the colon should be repeated every 5-10 years through 28 years of age. However, you may need to be screened more often if early forms of  precancerous polyps or small growths are  found. Skin Cancer  Check your skin from head to toe regularly.  Tell your health care provider about any new moles or changes in moles, especially if there is a change in a mole's shape or color.  Also tell your health care provider if you have a mole that is larger than the size of a pencil eraser.  Always use sunscreen. Apply sunscreen liberally and repeatedly throughout the day.  Protect yourself by wearing long sleeves, pants, a wide-brimmed hat, and sunglasses whenever you are outside. Heart disease, diabetes, and high blood pressure  High blood pressure causes heart disease and increases the risk of stroke. High blood pressure is more likely to develop in:  People who have blood pressure in the high end of the normal range (130-139/85-89 mm Hg).  People who are overweight or obese.  People who are African American.  If you are 14-95 years of age, have your blood pressure checked every 3-5 years. If you are 48 years of age or older, have your blood pressure checked every year. You should have your blood pressure measured twice-once when you are at a hospital or clinic, and once when you are not at a hospital or clinic. Record the average of the two measurements. To check your blood pressure when you are not at a hospital or clinic, you can use:  An automated blood pressure machine at a pharmacy.  A home blood pressure monitor.  If you are between 63 years and 80 years old, ask your health care provider if you should take aspirin to prevent strokes.  Have regular diabetes screenings. This involves taking a blood sample to check your fasting blood sugar level.  If you are at a normal weight and have a low risk for diabetes, have this test once every three years after 28 years of age.  If you are overweight and have a high risk for diabetes, consider being tested at a younger age or more often. Preventing infection Hepatitis B  If you  have a higher risk for hepatitis B, you should be screened for this virus. You are considered at high risk for hepatitis B if:  You were born in a country where hepatitis B is common. Ask your health care provider which countries are considered high risk.  Your parents were born in a high-risk country, and you have not been immunized against hepatitis B (hepatitis B vaccine).  You have HIV or AIDS.  You use needles to inject street drugs.  You live with someone who has hepatitis B.  You have had sex with someone who has hepatitis B.  You get hemodialysis treatment.  You take certain medicines for conditions, including cancer, organ transplantation, and autoimmune conditions. Hepatitis C  Blood testing is recommended for:  Everyone born from [birth year] through 1963.  Anyone with known risk factors for hepatitis C. Sexually transmitted infections (STIs)  You should be screened for sexually transmitted infections (STIs) including gonorrhea and chlamydia if:  You are sexually active and are younger than 28 years of age.  You are older than 28 years of age and your health care provider tells you that you are at risk for this type of infection.  Your sexual activity has changed since you were last screened and you are at an increased risk for chlamydia or gonorrhea. Ask your health care provider if you are at risk.  If you do not have HIV, but are at risk, it may be recommended that you take a prescription  medicine daily to prevent HIV infection. This is called pre-exposure prophylaxis (PrEP). You are considered at risk if:  You are sexually active and do not regularly use condoms or know the HIV status of your partner(s).  You take drugs by injection.  You are sexually active with a partner who has HIV. Talk with your health care provider about whether you are at high risk of being infected with HIV. If you choose to begin PrEP, you should first be tested for HIV. You should then be  tested every 3 months for as long as you are taking PrEP. Pregnancy  If you are premenopausal and you may become pregnant, ask your health care provider about preconception counseling.  If you may become pregnant, take 400 to 800 micrograms (mcg) of folic acid every day.  If you want to prevent pregnancy, talk to your health care provider about birth control (contraception). Osteoporosis and menopause  Osteoporosis is a disease in which the bones lose minerals and strength with aging. This can result in serious bone fractures. Your risk for osteoporosis can be identified using a bone density scan.  If you are 23 years of age or older, or if you are at risk for osteoporosis and fractures, ask your health care provider if you should be screened.  Ask your health care provider whether you should take a calcium or vitamin D supplement to lower your risk for osteoporosis.  Menopause may have certain physical symptoms and risks.  Hormone replacement therapy may reduce some of these symptoms and risks. Talk to your health care provider about whether hormone replacement therapy is right for you. Follow these instructions at home:  Schedule regular health, dental, and eye exams.  Stay current with your immunizations.  Do not use any tobacco products including cigarettes, chewing tobacco, or electronic cigarettes.  If you are pregnant, do not drink alcohol.  If you are breastfeeding, limit how much and how often you drink alcohol.  Limit alcohol intake to no more than 1 drink per day for nonpregnant women. One drink equals 12 ounces of beer, 5 ounces of wine, or 1 ounces of hard liquor.  Do not use street drugs.  Do not share needles.  Ask your health care provider for help if you need support or information about quitting drugs.  Tell your health care provider if you often feel depressed.  Tell your health care provider if you have ever been abused or do not feel safe at home. This  information is not intended to replace advice given to you by your health care provider. Make sure you discuss any questions you have with your health care provider. Document Released: 12/21/2010 Document Revised: 11/13/2015 Document Reviewed: 03/11/2015  2017 Elsevier

## 2016-06-22 ENCOUNTER — Telehealth: Payer: Self-pay | Admitting: Family Medicine

## 2016-06-22 NOTE — Telephone Encounter (Signed)
Patient notified and verbalized understanding.  Patient stated that she would call back to schedule follow up appointment due to being at work.

## 2016-06-22 NOTE — Telephone Encounter (Signed)
Please call pt: - Her vit d (24) is still mildly low, make certain she continues taking B12 and Vit d daily (she admits to forgetting). - her cholesterol has improved from last time.  - her a1c is 5.8  - all other labs normal.  - f/u 6 months with a1c.

## 2016-07-02 DIAGNOSIS — H52223 Regular astigmatism, bilateral: Secondary | ICD-10-CM | POA: Diagnosis not present

## 2016-07-02 DIAGNOSIS — H5213 Myopia, bilateral: Secondary | ICD-10-CM | POA: Diagnosis not present

## 2016-08-02 DIAGNOSIS — J351 Hypertrophy of tonsils: Secondary | ICD-10-CM | POA: Diagnosis not present

## 2016-08-02 DIAGNOSIS — J342 Deviated nasal septum: Secondary | ICD-10-CM | POA: Diagnosis not present

## 2016-08-02 DIAGNOSIS — J343 Hypertrophy of nasal turbinates: Secondary | ICD-10-CM | POA: Diagnosis not present

## 2016-08-02 DIAGNOSIS — J302 Other seasonal allergic rhinitis: Secondary | ICD-10-CM | POA: Diagnosis not present

## 2016-08-02 DIAGNOSIS — F1020 Alcohol dependence, uncomplicated: Secondary | ICD-10-CM | POA: Diagnosis not present

## 2016-08-02 DIAGNOSIS — R0683 Snoring: Secondary | ICD-10-CM | POA: Diagnosis not present

## 2016-08-10 ENCOUNTER — Encounter: Payer: Self-pay | Admitting: Family Medicine

## 2016-08-11 ENCOUNTER — Other Ambulatory Visit: Payer: Self-pay | Admitting: Family Medicine

## 2016-08-11 DIAGNOSIS — Z639 Problem related to primary support group, unspecified: Secondary | ICD-10-CM

## 2016-08-11 NOTE — Progress Notes (Unsigned)
Referral to [facility] for counseling vis Renata Caprice request of pt.

## 2016-08-12 ENCOUNTER — Encounter: Payer: Self-pay | Admitting: *Deleted

## 2016-08-30 ENCOUNTER — Ambulatory Visit: Payer: 59 | Admitting: Psychology

## 2016-09-17 DIAGNOSIS — J343 Hypertrophy of nasal turbinates: Secondary | ICD-10-CM | POA: Diagnosis not present

## 2016-09-17 DIAGNOSIS — R0683 Snoring: Secondary | ICD-10-CM | POA: Diagnosis not present

## 2016-09-17 DIAGNOSIS — J351 Hypertrophy of tonsils: Secondary | ICD-10-CM | POA: Diagnosis not present

## 2016-09-17 DIAGNOSIS — J342 Deviated nasal septum: Secondary | ICD-10-CM | POA: Diagnosis not present

## 2016-09-20 ENCOUNTER — Ambulatory Visit (INDEPENDENT_AMBULATORY_CARE_PROVIDER_SITE_OTHER): Payer: 59 | Admitting: Psychology

## 2016-09-20 DIAGNOSIS — F411 Generalized anxiety disorder: Secondary | ICD-10-CM

## 2016-10-05 ENCOUNTER — Encounter: Payer: Self-pay | Admitting: Family Medicine

## 2016-10-05 ENCOUNTER — Ambulatory Visit (INDEPENDENT_AMBULATORY_CARE_PROVIDER_SITE_OTHER): Payer: 59 | Admitting: Family Medicine

## 2016-10-05 VITALS — BP 137/87 | HR 82 | Temp 98.0°F | Resp 20 | Wt 356.8 lb

## 2016-10-05 DIAGNOSIS — J029 Acute pharyngitis, unspecified: Secondary | ICD-10-CM | POA: Diagnosis not present

## 2016-10-05 LAB — POCT RAPID STREP A (OFFICE): RAPID STREP A SCREEN: NEGATIVE

## 2016-10-05 MED ORDER — METHYLPREDNISOLONE ACETATE 80 MG/ML IJ SUSP
80.0000 mg | Freq: Once | INTRAMUSCULAR | Status: AC
  Administered 2016-10-05: 80 mg via INTRAMUSCULAR

## 2016-10-05 MED ORDER — AMOXICILLIN-POT CLAVULANATE 875-125 MG PO TABS: 1.0000 | ORAL_TABLET | Freq: Two times a day (BID) | ORAL | 0 refills | Status: DC

## 2016-10-05 NOTE — Patient Instructions (Signed)
Mucinex DM, salt water gargle a few times a day.  Rest, hydrate, advil as needed  Start Augmentin every 12 hours for 10 days.    I sent a throat culture, we will call you with results.   Pharyngitis Pharyngitis is redness, pain, and swelling (inflammation) of your pharynx. What are the causes? Pharyngitis is usually caused by infection. Most of the time, these infections are from viruses (viral) and are part of a cold. However, sometimes pharyngitis is caused by bacteria (bacterial). Pharyngitis can also be caused by allergies. Viral pharyngitis may be spread from person to person by coughing, sneezing, and personal items or utensils (cups, forks, spoons, toothbrushes). Bacterial pharyngitis may be spread from person to person by more intimate contact, such as kissing. What are the signs or symptoms? Symptoms of pharyngitis include:  Sore throat.  Tiredness (fatigue).  Low-grade fever.  Headache.  Joint pain and muscle aches.  Skin rashes.  Swollen lymph nodes.  Plaque-like film on throat or tonsils (often seen with bacterial pharyngitis). How is this diagnosed? Your health care provider will ask you questions about your illness and your symptoms. Your medical history, along with a physical exam, is often all that is needed to diagnose pharyngitis. Sometimes, a rapid strep test is done. Other lab tests may also be done, depending on the suspected cause. How is this treated? Viral pharyngitis will usually get better in 3-4 days without the use of medicine. Bacterial pharyngitis is treated with medicines that kill germs (antibiotics). Follow these instructions at home:  Drink enough water and fluids to keep your urine clear or pale yellow.  Only take over-the-counter or prescription medicines as directed by your health care provider:  If you are prescribed antibiotics, make sure you finish them even if you start to feel better.  Do not take aspirin.  Get lots of  rest.  Gargle with 8 oz of salt water ( tsp of salt per 1 qt of water) as often as every 1-2 hours to soothe your throat.  Throat lozenges (if you are not at risk for choking) or sprays may be used to soothe your throat. Contact a health care provider if:  You have large, tender lumps in your neck.  You have a rash.  You cough up green, yellow-brown, or bloody spit. Get help right away if:  Your neck becomes stiff.  You drool or are unable to swallow liquids.  You vomit or are unable to keep medicines or liquids down.  You have severe pain that does not go away with the use of recommended medicines.  You have trouble breathing (not caused by a stuffy nose). This information is not intended to replace advice given to you by your health care provider. Make sure you discuss any questions you have with your health care provider. Document Released: 06/07/2005 Document Revised: 11/13/2015 Document Reviewed: 02/12/2013 Elsevier Interactive Patient Education  2017 [employer].

## 2016-10-05 NOTE — Addendum Note (Signed)
Addended by: Leota Jacobsen on: 10/05/2016 11:23 AM   Modules accepted: Orders

## 2016-10-05 NOTE — Progress Notes (Signed)
[name] , [birth year], 31 y.o., female MRN: [id number] Patient Care Team    Relationship Specialty Notifications Start End  Ma Hillock, DO PCP - General Family Medicine  04/25/15   Servando Salina, MD Consulting Physician Obstetrics and Gynecology  06/18/16   Wellington Hampshire, MD Consulting Physician Cardiology  06/18/16     CC: Sore throat Subjective: Pt presents for an OV with complaints of sore throat  of 5 days duration.  Associated symptoms include Fever, chills, fatigue, decreased appetite, body aches and swollen glands. Patient has tried Advil to help with the discomfort. She feels the body aches have resolved, but she is still extremely fatigued and now her throat is severely sore. She has had a few occurrences a year. Tonsillitis. She has been evaluated by ENT for snoring and has been noted to have enlarged tonsils and deviated septum.  Depression screen [facility] 2/9 06/18/2016 06/28/2014  Decreased Interest 0 0  Down, Depressed, Hopeless 0 0  PHQ - 2 Score 0 0    No Known Allergies Social History  Substance Use Topics  . Smoking status: Never Smoker  . Smokeless tobacco: Never Used  . Alcohol use 0.0 oz/week   Past Medical History:  Diagnosis Date  . BPES syndrome   . PCOS (polycystic ovarian syndrome)    Past Surgical History:  Procedure Laterality Date  . EYE SURGERY Bilateral    Family History  Problem Relation Age of Onset  . Hyperlipidemia Father   . Hypertension Father   . Heart disease Father   . Sudden death Father     Age 65  . Other Father     BPES  . Heart attack Father   . Other Other     BPES  . Other Other     BPES  . Hyperlipidemia Mother   . Hypertension Mother   . Other Brother     BPES  . Other Sister     BPES  . Other Paternal Uncle     BPES  . Other Cousin     BPES  . Other Cousin     BPES  . Heart attack Maternal Grandmother   . Heart disease Maternal Grandfather   . Heart disease Paternal Grandfather heartr attack   . Heart attack Paternal Grandfather 47    MI  . Sudden death Paternal Grandfather    Allergies as of 10/05/2016   No Known Allergies     Medication List       Accurate as of 10/05/16 10:45 AM. Always use your most recent med list.          Cholecalciferol 5000 units capsule Take 1 capsule (5,000 Units total) by mouth daily. With meal   Cyanocobalamin 2500 MCG Subl Place 2,400 mcg under the tongue.   fluticasone 50 MCG/ACT nasal spray Commonly known as:  FLONASE Place into the nose.   HEATHER 0.35 MG tablet Generic drug:  norethindrone   metFORMIN 500 MG 24 hr tablet Commonly known as:  GLUCOPHAGE-XR Take 4 tablets (2,000 mg total) by mouth daily.   multivitamin capsule Take 1 capsule by mouth daily.       No results found for this or any previous visit (from the past 24 hour(s)). No results found.   ROS: Negative, with the exception of above mentioned in HPI   Objective:  BP 137/87 (BP Location: Right Arm, Patient Position: Sitting, Cuff Size: Large)   Pulse 82   Temp 98 F (36.7  C)   Resp 20   Wt (!) 356 lb 12 oz (161.8 kg)   SpO2 98%   BMI 52.68 kg/m  Body mass index is 52.68 kg/m. Gen: Afebrile. No acute distress. Nontoxic in appearance, well developed, well nourished. Pleasant Caucasian female. HENT: AT. [place]. Bilateral TM visualized bilateral fullness, no erythema. MMM, no oral lesions. Bilateral nares without erythema or swelling. Throat with erythema, enlarged tonsils with exudates bilaterally. No cough. Hoarseness present. No tenderness to sinus palpation. Eyes:Pupils Equal Round Reactive to light, Extraocular movements intact,  Conjunctiva without redness, discharge or icterus. Neck/lymp/endocrine: Supple, outer tender anterior cervical lymphadenopathy CV: RRR  Chest: CTAB, no wheeze or crackles. Good air movement, normal resp effort.  Abd: Soft. NTND. BS present.  Skin: No rashes, purpura or petechiae.   Results for orders placed or performed  in visit on 10/05/16 (from the past 24 hour(s))  POCT rapid strep A     Status: Normal   Collection Time: 10/05/16 10:46 AM  Result Value Ref Range   Rapid Strep A Screen Negative Negative     Assessment/Plan: [name] is a 31 y.o. female present for OV for  - rest, hydrate, Mucinex DM, Chloraseptic, salt water gargle. Advil when necessary. - Rapid strep negative. Throat culture sent. - Augmentin twice a day 10 days. - IM Depo-Medrol day. - Work excuse provided. - Follow-up when necessary.  Reviewed expectations re: course of current medical issues.  Discussed self-management of symptoms.  Outlined signs and symptoms indicating need for more acute intervention.  Patient verbalized understanding and all questions were answered.  Patient received an After-Visit Summary.   electronically signed by:  Howard Pouch, DO  [place]

## 2016-10-07 ENCOUNTER — Ambulatory Visit (INDEPENDENT_AMBULATORY_CARE_PROVIDER_SITE_OTHER): Payer: 59 | Admitting: Psychology

## 2016-10-07 DIAGNOSIS — F411 Generalized anxiety disorder: Secondary | ICD-10-CM | POA: Diagnosis not present

## 2016-10-08 ENCOUNTER — Telehealth: Payer: Self-pay | Admitting: Family Medicine

## 2016-10-08 LAB — CULTURE, UPPER RESPIRATORY

## 2016-10-08 NOTE — Telephone Encounter (Signed)
Spoke with patient reviewed results . patient states she is improving but not completely resolved she is still taking Rx'd medication.

## 2016-10-08 NOTE — Telephone Encounter (Signed)
Please call pt: - her throat culture isolated strep C infection. Strep C is consider normal flora of the skin, but can cause pharyngitis. The medication provided to her covers this infection.

## 2016-10-10 ENCOUNTER — Encounter: Payer: Self-pay | Admitting: Family Medicine

## 2016-10-11 ENCOUNTER — Telehealth: Payer: Self-pay | Admitting: Family Medicine

## 2016-10-11 MED ORDER — FLUCONAZOLE 150 MG PO TABS: 150.0000 mg | ORAL_TABLET | Freq: Every day | ORAL | 0 refills | Status: DC

## 2016-10-11 NOTE — Telephone Encounter (Signed)
Patient notified that Diflucan has been sent in via Dr. Raoul Pitch to [place].

## 2016-10-11 NOTE — Telephone Encounter (Signed)
Diflucan prescribed for yeast infection after abx use. Sent to [facility] pharmacy.

## 2016-10-21 ENCOUNTER — Ambulatory Visit (INDEPENDENT_AMBULATORY_CARE_PROVIDER_SITE_OTHER): Payer: 59 | Admitting: Psychology

## 2016-10-21 DIAGNOSIS — F411 Generalized anxiety disorder: Secondary | ICD-10-CM

## 2016-11-08 ENCOUNTER — Ambulatory Visit: Payer: 59 | Admitting: Psychology

## 2016-11-29 ENCOUNTER — Encounter: Payer: Self-pay | Admitting: Family Medicine

## 2016-11-29 ENCOUNTER — Ambulatory Visit (INDEPENDENT_AMBULATORY_CARE_PROVIDER_SITE_OTHER): Payer: 59 | Admitting: Family Medicine

## 2016-11-29 VITALS — BP 111/77 | HR 69 | Temp 98.7°F | Resp 16 | Wt 351.0 lb

## 2016-11-29 DIAGNOSIS — D518 Other vitamin B12 deficiency anemias: Secondary | ICD-10-CM

## 2016-11-29 DIAGNOSIS — E282 Polycystic ovarian syndrome: Secondary | ICD-10-CM | POA: Diagnosis not present

## 2016-11-29 DIAGNOSIS — E559 Vitamin D deficiency, unspecified: Secondary | ICD-10-CM | POA: Diagnosis not present

## 2016-11-29 DIAGNOSIS — Z6841 Body Mass Index (BMI) 40.0 and over, adult: Secondary | ICD-10-CM | POA: Diagnosis not present

## 2016-11-29 DIAGNOSIS — R7303 Prediabetes: Secondary | ICD-10-CM

## 2016-11-29 LAB — POCT GLYCOSYLATED HEMOGLOBIN (HGB A1C): Hemoglobin A1C: 5.1

## 2016-11-29 LAB — VITAMIN D 25 HYDROXY (VIT D DEFICIENCY, FRACTURES): VITD: 33.67 ng/mL (ref 30.00–100.00)

## 2016-11-29 MED ORDER — METFORMIN HCL ER 500 MG PO TB24: 2000.0000 mg | ORAL_TABLET | Freq: Every day | ORAL | 1 refills | Status: DC

## 2016-11-29 NOTE — Progress Notes (Signed)
[name] , 1986-04-10, 31 y.o., female MRN: [id number] Patient Care Team    Relationship Specialty Notifications Start End  Ma Hillock, DO PCP - General Family Medicine  04/25/15   Vanessa Salina, MD Consulting Physician Obstetrics and Gynecology  06/18/16   Wellington Hampshire, MD Consulting Physician Cardiology  06/18/16     Chief Complaint  Patient presents with  . Follow-up    RCI     Subjective:   BMI 50.0-59.9, adult (HCC)/prediabetes/morbid obesity/PCOS Pt has been able to lose 5 lbs. Her last a1c was 05/2016 at 5.8. She reports compliance with metformin 2000 mg QD. She is prescribed BCP through GYN.  She has been dieting and exercising. She has lost a couple pounds. 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 She is in a relationship now as well.  Vitamin D deficiency -currently taking 5000 u daily, last vit.d 24 05/2016.    Other vitamin B12 deficiency anemia Continues to take sublingual, last B12 good.    Depression screen [facility] 2/9 06/18/2016 06/28/2014  Decreased Interest 0 0  Down, Depressed, Hopeless 0 0  PHQ - 2 Score 0 0    No Known Allergies Social History  Substance Use Topics  . Smoking status: Never Smoker  . Smokeless tobacco: Never Used  . Alcohol use 0.0 oz/week   Past Medical History:  Diagnosis Date  . BPES syndrome   . PCOS (polycystic ovarian syndrome)    Past Surgical History:  Procedure Laterality Date  . EYE SURGERY Bilateral    Family History  Problem Relation Age of Onset  . Hyperlipidemia Father   . Hypertension Father   . Heart disease Father   . Sudden death Father        Age 54  . Other Father        BPES  . Heart attack Father   . Other Other        BPES  . Other Other        BPES  . Hyperlipidemia Mother   . Hypertension Mother   . Other Brother        BPES  . Other Sister        BPES  . Other Paternal Uncle        BPES  . Other Cousin        BPES  . Other Cousin     BPES  . Heart attack Maternal Grandmother   . Heart disease Maternal Grandfather   . Heart disease Paternal Grandfather heartr attack  . Heart attack Paternal Grandfather 27       MI  . Sudden death Paternal Grandfather    Allergies as of 11/29/2016   No Known Allergies     Medication List       Accurate as of 11/29/16  9:50 AM. Always use your most recent med list.          Cholecalciferol 5000 units capsule Take 1 capsule (5,000 Units total) by mouth daily. With meal   Cyanocobalamin 2500 MCG Subl Place 2,400 mcg under the tongue.   fluticasone 50 MCG/ACT nasal spray Commonly known as:  FLONASE Place into the nose.   HEATHER 0.35 MG tablet Generic drug:  norethindrone   metFORMIN 500 MG 24 hr tablet Commonly known as:  GLUCOPHAGE-XR Take 4 tablets (2,000 mg total) by mouth daily.   multivitamin capsule Take 1 capsule by mouth daily.  All past medical history, surgical history, allergies, family history, immunizations andmedications were updated in the EMR today and reviewed under the history and medication portions of their EMR.     ROS: Negative, with the exception of above mentioned in HPI   Objective:  BP 111/77 (BP Location: Left Arm, Patient Position: Sitting, Cuff Size: Large)   Pulse 69   Temp 98.7 F (37.1 C) (Oral)   Resp 16   Wt (!) 351 lb (159.2 kg)   SpO2 97%   BMI 51.83 kg/m  Body mass index is 51.83 kg/m. Gen: Afebrile. No acute distress. Nontoxic in appearance, well developed, well nourished. Very pleasant caucasian female. Obese.  HENT: AT. .  MMM. Eyes:Pupils Equal Round Reactive to light, Extraocular movements intact,  Conjunctiva without redness, discharge or icterus. CV: RRR, no edema Chest: CTAB, no wheeze or crackles. Good air movement, normal resp effort.  Abd: Soft. obese. NTND. BS present.  Neuro:  Normal gait. PERLA. EOMi. Alert. Oriented x3  Psych: Normal affect, dress and demeanor. Normal speech. Normal thought  content and judgment.  No exam data present No results found. No results found for this or any previous visit (from the past 24 hour(s)).  Assessment/Plan: [name] is a 31 y.o. female present for OV for  Vitamin D deficiency - she  Thinks she has only be taking 4000 u  Daily, had discussed increasing to 5000 u, but she forgot. - Vitamin D (25 hydroxy)  Other vitamin B12 deficiency anemia - continue OTC supplement.   Prediabetes Morbid obesity ([place]) PCBMI 50.0-59.9, adult ([place]) PCOS (polycystic ovarian syndrome) - 5.8--> 5.1 today!!! - metformin use mostly for PCOS. Discussed tapering back given now on BCP through GYN and a1c is 5.1. Pt will remain on current dose andd if continues to lose weight, or maintain, cut back to 1000 mg QD before September.  - F/U 6 months for CPE and will recheck a1c again on lower dose metformin at that time.     Reviewed expectations re: course of current medical issues.  Discussed self-management of symptoms.  Outlined signs and symptoms indicating need for more acute intervention.  Patient verbalized understanding and all questions were answered.  Patient received an After-Visit Summary.  Note is dictated utilizing voice recognition software. Although note has been proof read prior to signing, occasional typographical errors still can be missed. If any questions arise, please do not hesitate to call for verification.   electronically signed by:  Howard Pouch, DO  [place]

## 2016-11-29 NOTE — Patient Instructions (Signed)
I will see you in 6 months.  Since you are on the new birth control pill, and your a1c is great not, we may be able to cut back on the metformin to even 1000 mg daily. If you can continue weight loss, you can cut back in September.  Folow up in December with CPE  Congrats!!!!

## 2016-11-30 ENCOUNTER — Telehealth: Payer: Self-pay | Admitting: Family Medicine

## 2016-11-30 NOTE — Telephone Encounter (Signed)
Please call pt: - her Vit d is normal (33) continue current daily regimen.

## 2016-11-30 NOTE — Telephone Encounter (Signed)
Spoke with patient reviewed lab results. 

## 2016-12-03 ENCOUNTER — Ambulatory Visit: Payer: Self-pay | Admitting: Psychology

## 2016-12-31 ENCOUNTER — Ambulatory Visit (INDEPENDENT_AMBULATORY_CARE_PROVIDER_SITE_OTHER): Payer: 59 | Admitting: Psychology

## 2016-12-31 DIAGNOSIS — F411 Generalized anxiety disorder: Secondary | ICD-10-CM | POA: Diagnosis not present

## 2017-01-13 ENCOUNTER — Encounter: Payer: Self-pay | Admitting: Family Medicine

## 2017-01-24 ENCOUNTER — Ambulatory Visit (INDEPENDENT_AMBULATORY_CARE_PROVIDER_SITE_OTHER): Payer: 59 | Admitting: Psychology

## 2017-01-24 DIAGNOSIS — F411 Generalized anxiety disorder: Secondary | ICD-10-CM | POA: Diagnosis not present

## 2017-02-07 ENCOUNTER — Ambulatory Visit: Payer: 59 | Admitting: Psychology

## 2017-02-15 DIAGNOSIS — R0683 Snoring: Secondary | ICD-10-CM | POA: Diagnosis not present

## 2017-02-15 DIAGNOSIS — J343 Hypertrophy of nasal turbinates: Secondary | ICD-10-CM | POA: Diagnosis not present

## 2017-02-15 DIAGNOSIS — J351 Hypertrophy of tonsils: Secondary | ICD-10-CM | POA: Diagnosis not present

## 2017-02-15 DIAGNOSIS — J342 Deviated nasal septum: Secondary | ICD-10-CM | POA: Diagnosis not present

## 2017-02-23 ENCOUNTER — Ambulatory Visit (INDEPENDENT_AMBULATORY_CARE_PROVIDER_SITE_OTHER): Payer: 59 | Admitting: Psychology

## 2017-02-23 DIAGNOSIS — F411 Generalized anxiety disorder: Secondary | ICD-10-CM

## 2017-02-25 ENCOUNTER — Other Ambulatory Visit: Payer: Self-pay | Admitting: Otolaryngology

## 2017-02-28 NOTE — Pre-Procedure Instructions (Signed)
LAURENASHLEY VIAR  02/28/2017      [place], [place] - [address] [facility]. [address] [place] [address] Phone: [phone] Fax: [phone]    Your procedure is scheduled on September 14  Report to [facility] at [place].M.  Call this number if you have problems the morning of surgery:  [phone]   Remember:  Do not eat food or drink liquids after midnight.  Continue all other medications as directed by your physician except follow these medication instructions before surgery   Take these medicines the morning of surgery with A SIP OF WATER  fluticasone (FLONASE)   7 days prior to surgery STOP taking any Aspirin, Aleve, Naproxen, Ibuprofen, Motrin, Advil, Goody's, BC's, all herbal medications, fish oil, and all vitamins   WHAT DO I DO ABOUT MY DIABETES MEDICATION?   [name] Do not take oral diabetes medicines (pills) the morning of surgery. metFORMIN (GLUCOPHAGE-XR)    How to Manage Your Diabetes Before and After Surgery  Why is it important to control my blood sugar before and after surgery? . Improving blood sugar levels before and after surgery helps healing and can limit problems. . A way of improving blood sugar control is eating a healthy diet by: o  Eating less sugar and carbohydrates o  Increasing activity/exercise o  Talking with your doctor about reaching your blood sugar goals . High blood sugars (greater than 180 mg/dL) can raise your risk of infections and slow your recovery, so you will need to focus on controlling your diabetes during the weeks before surgery. . Make sure that the doctor who takes care of your diabetes knows about your planned surgery including the date and location.  How do I manage my blood sugar before surgery? . Check your blood sugar at least 4 times a day, starting 2 days before surgery, to make sure that the level is not too high or low. o Check your blood sugar  the morning of your surgery when you wake up and every 2 hours until you get to the Short Stay unit. . If your blood sugar is less than 70 mg/dL, you will need to treat for low blood sugar: o Do not take insulin. o Treat a low blood sugar (less than 70 mg/dL) with  cup of clear juice (cranberry or apple), 4 glucose tablets, OR glucose gel. o Recheck blood sugar in 15 minutes after treatment (to make sure it is greater than 70 mg/dL). If your blood sugar is not greater than 70 mg/dL on recheck, call [phone] for further instructions. . Report your blood sugar to the short stay nurse when you get to Short Stay.  . If you are admitted to the hospital after surgery: o Your blood sugar will be checked by the staff and you will probably be given insulin after surgery (instead of oral diabetes medicines) to make sure you have good blood sugar levels. o The goal for blood sugar control after surgery is 80-180 mg/dL.    Do not wear jewelry, make-up or nail polish.  Do not wear lotions, powders, or perfumes, or deoderant.  Do not shave 48 hours prior to surgery.    Do not bring valuables to the hospital.  [facility] is not responsible for any belongings or valuables.  Contacts, dentures or bridgework may not be worn into surgery.  Leave your suitcase in the car.  After surgery it may be brought to  your room.  For patients admitted to the hospital, discharge time will be determined by your treatment team.  Patients discharged the day of surgery will not be allowed to drive home.    Special instructions:   [place]- Preparing For Surgery  Before surgery, you can play an important role. Because skin is not sterile, your skin needs to be as free of germs as possible. You can reduce the number of germs on your skin by washing with CHG (chlorahexidine gluconate) Soap before surgery.  CHG is an antiseptic cleaner which kills germs and bonds with the skin to continue killing germs even after  washing.  Please do not use if you have an allergy to CHG or antibacterial soaps. If your skin becomes reddened/irritated stop using the CHG.  Do not shave (including legs and underarms) for at least 48 hours prior to first CHG shower. It is OK to shave your face.  Please follow these instructions carefully.   1. Shower the NIGHT BEFORE SURGERY and the MORNING OF SURGERY with CHG.   2. If you chose to wash your hair, wash your hair first as usual with your normal shampoo.  3. After you shampoo, rinse your hair and body thoroughly to remove the shampoo.  4. Use CHG as you would any other liquid soap. You can apply CHG directly to the skin and wash gently with a scrungie or a clean washcloth.   5. Apply the CHG Soap to your body ONLY FROM THE NECK DOWN.  Do not use on open wounds or open sores. Avoid contact with your eyes, ears, mouth and genitals (private parts). Wash genitals (private parts) with your normal soap.  6. Wash thoroughly, paying special attention to the area where your surgery will be performed.  7. Thoroughly rinse your body with warm water from the neck down.  8. DO NOT shower/wash with your normal soap after using and rinsing off the CHG Soap.  9. Pat yourself dry with a CLEAN TOWEL.   10. Wear CLEAN PAJAMAS   11. Place CLEAN SHEETS on your bed the night of your first shower and DO NOT SLEEP WITH PETS.    Day of Surgery: Do not apply any deodorants/lotions. Please wear clean clothes to the hospital/surgery center.      Please read over the following fact sheets that you were given.

## 2017-03-01 ENCOUNTER — Encounter (HOSPITAL_COMMUNITY)
Admission: RE | Admit: 2017-03-01 | Discharge: 2017-03-01 | Disposition: A | Discharge status: Home / Self Care | Payer: 59 | Source: Ambulatory Visit | Attending: Otolaryngology | Admitting: Otolaryngology

## 2017-03-01 ENCOUNTER — Encounter (HOSPITAL_COMMUNITY): Payer: Self-pay | Admitting: *Deleted

## 2017-03-01 DIAGNOSIS — J351 Hypertrophy of tonsils: Secondary | ICD-10-CM | POA: Diagnosis not present

## 2017-03-01 DIAGNOSIS — J343 Hypertrophy of nasal turbinates: Secondary | ICD-10-CM | POA: Diagnosis not present

## 2017-03-01 DIAGNOSIS — Z6841 Body Mass Index (BMI) 40.0 and over, adult: Secondary | ICD-10-CM | POA: Diagnosis not present

## 2017-03-01 DIAGNOSIS — J342 Deviated nasal septum: Secondary | ICD-10-CM | POA: Diagnosis not present

## 2017-03-01 DIAGNOSIS — E282 Polycystic ovarian syndrome: Secondary | ICD-10-CM | POA: Diagnosis not present

## 2017-03-01 DIAGNOSIS — E2839 Other primary ovarian failure: Secondary | ICD-10-CM | POA: Diagnosis not present

## 2017-03-01 DIAGNOSIS — Z7984 Long term (current) use of oral hypoglycemic drugs: Secondary | ICD-10-CM | POA: Diagnosis not present

## 2017-03-01 DIAGNOSIS — J353 Hypertrophy of tonsils with hypertrophy of adenoids: Secondary | ICD-10-CM | POA: Diagnosis present

## 2017-03-01 HISTORY — DX: Other primary ovarian failure: E28.39

## 2017-03-01 HISTORY — DX: Other specified postprocedural states: Z98.890

## 2017-03-01 HISTORY — DX: Other ovarian dysfunction: E28.8

## 2017-03-01 HISTORY — DX: Other specified postprocedural states: R11.2

## 2017-03-01 HISTORY — DX: Palpitations: R00.2

## 2017-03-01 HISTORY — DX: Nontoxic goiter, unspecified: E04.9

## 2017-03-01 HISTORY — DX: Presence of spectacles and contact lenses: Z97.3

## 2017-03-01 HISTORY — DX: Anemia, unspecified: D64.9

## 2017-03-01 LAB — BASIC METABOLIC PANEL
ANION GAP: 7 (ref 5–15)
BUN: 10 mg/dL (ref 6–20)
CHLORIDE: 106 mmol/L (ref 101–111)
CO2: 25 mmol/L (ref 22–32)
Calcium: 9.1 mg/dL (ref 8.9–10.3)
Creatinine, Ser: 0.69 mg/dL (ref 0.44–1.00)
GFR calc Af Amer: >60 mL/min (ref >60)
GFR calc non Af Amer: >60 mL/min (ref >60)
GLUCOSE: 90 mg/dL (ref 65–99)
POTASSIUM: 4.2 mmol/L (ref 3.5–5.1)
Sodium: 138 mmol/L (ref 135–145)

## 2017-03-01 LAB — CBC
HEMATOCRIT: 39.6 % (ref 36.0–46.0)
HEMOGLOBIN: 12.5 g/dL (ref 12.0–15.0)
MCH: 27.5 pg (ref 26.0–34.0)
MCHC: 31.6 g/dL (ref 30.0–36.0)
MCV: 87.2 fL (ref 78.0–100.0)
Platelets: 286 10*3/uL (ref 150–400)
RBC: 4.54 MIL/uL (ref 3.87–5.11)
RDW: 14.6 % (ref 11.5–15.5)
WBC: 7.5 10*3/uL (ref 4.0–10.5)

## 2017-03-01 NOTE — Progress Notes (Signed)
Lab called, pt's preg test hemolyzed.  Called patient, requested she come back in--pt states she can leave urine preg sample 03/02/17 when she is here for work.

## 2017-03-01 NOTE — Progress Notes (Signed)
PCP: Dr. Kem Parkinson Cardiologist: Denies--Did see Dr. Fletcher Anon for palpitations and "significant family heart history" in 2017, had ECHO, "found normal."  No further follow-up needed  EKG: 09/2015 ECHO: 2017  Pt NOT a diabetic, takes metformin for PCOS.  Patient denies shortness of breath, fever, cough, and chest pain at PAT appointment.  Patient verbalized understanding of instructions provided today at the PAT appointment.  Patient asked to review instructions at home and day of surgery.   Called and spoke with Levada Dy NP regarding BMI, did not need to see chart.

## 2017-03-02 LAB — PREGNANCY, URINE: Preg Test, Ur: NEGATIVE

## 2017-03-03 MED ORDER — DEXTROSE 5 % IV SOLN
3.0000 g | INTRAVENOUS | Status: AC
  Administered 2017-03-04: 3 g via INTRAVENOUS
  Filled 2017-03-03: qty 3000

## 2017-03-04 ENCOUNTER — Ambulatory Visit (HOSPITAL_BASED_OUTPATIENT_CLINIC_OR_DEPARTMENT_OTHER)
Admission: RE | Admit: 2017-03-04 | Discharge: 2017-03-04 | Disposition: A | Discharge status: Home / Self Care | Payer: 59 | Source: Ambulatory Visit | Attending: Otolaryngology | Admitting: Otolaryngology

## 2017-03-04 ENCOUNTER — Ambulatory Visit (HOSPITAL_COMMUNITY): Payer: 59 | Admitting: Anesthesiology

## 2017-03-04 ENCOUNTER — Encounter (HOSPITAL_COMMUNITY)
Admission: RE | Disposition: A | Discharge status: Home / Self Care | Payer: Self-pay | Source: Ambulatory Visit | Attending: Otolaryngology

## 2017-03-04 ENCOUNTER — Encounter (HOSPITAL_COMMUNITY): Payer: Self-pay

## 2017-03-04 DIAGNOSIS — J353 Hypertrophy of tonsils with hypertrophy of adenoids: Secondary | ICD-10-CM | POA: Diagnosis present

## 2017-03-04 DIAGNOSIS — Z6841 Body Mass Index (BMI) 40.0 and over, adult: Secondary | ICD-10-CM | POA: Diagnosis not present

## 2017-03-04 DIAGNOSIS — J342 Deviated nasal septum: Secondary | ICD-10-CM | POA: Diagnosis not present

## 2017-03-04 DIAGNOSIS — E2839 Other primary ovarian failure: Secondary | ICD-10-CM | POA: Insufficient documentation

## 2017-03-04 DIAGNOSIS — E282 Polycystic ovarian syndrome: Secondary | ICD-10-CM | POA: Diagnosis not present

## 2017-03-04 DIAGNOSIS — Z7984 Long term (current) use of oral hypoglycemic drugs: Secondary | ICD-10-CM | POA: Diagnosis not present

## 2017-03-04 DIAGNOSIS — J343 Hypertrophy of nasal turbinates: Secondary | ICD-10-CM | POA: Diagnosis not present

## 2017-03-04 DIAGNOSIS — J0301 Acute recurrent streptococcal tonsillitis: Secondary | ICD-10-CM | POA: Diagnosis not present

## 2017-03-04 DIAGNOSIS — J3501 Chronic tonsillitis: Secondary | ICD-10-CM | POA: Diagnosis not present

## 2017-03-04 DIAGNOSIS — R7303 Prediabetes: Secondary | ICD-10-CM | POA: Diagnosis not present

## 2017-03-04 DIAGNOSIS — J351 Hypertrophy of tonsils: Secondary | ICD-10-CM | POA: Diagnosis not present

## 2017-03-04 HISTORY — PX: NASAL SEPTUM SURGERY: SHX37

## 2017-03-04 HISTORY — PX: NASAL SEPTOPLASTY W/ TURBINOPLASTY: SHX2070

## 2017-03-04 HISTORY — PX: TONSILLECTOMY AND ADENOIDECTOMY: SHX28

## 2017-03-04 LAB — CREATININE, SERUM: CREATININE: 0.77 mg/dL (ref 0.44–1.00)

## 2017-03-04 LAB — CBC
HEMATOCRIT: 38.6 % (ref 36.0–46.0)
HEMOGLOBIN: 12 g/dL (ref 12.0–15.0)
MCH: 27 pg (ref 26.0–34.0)
MCHC: 31.1 g/dL (ref 30.0–36.0)
MCV: 86.7 fL (ref 78.0–100.0)
Platelets: 260 10*3/uL (ref 150–400)
RBC: 4.45 MIL/uL (ref 3.87–5.11)
RDW: 14.7 % (ref 11.5–15.5)
WBC: 9 10*3/uL (ref 4.0–10.5)

## 2017-03-04 LAB — GLUCOSE, CAPILLARY: Glucose-Capillary: 96 mg/dL (ref 65–99)

## 2017-03-04 SURGERY — SEPTOPLASTY, NOSE, WITH NASAL TURBINATE REDUCTION
Anesthesia: General | Site: Nose | Laterality: Bilateral

## 2017-03-04 MED ORDER — OXYMETAZOLINE HCL 0.05 % NA SOLN
NASAL | Status: AC
  Filled 2017-03-04: qty 15

## 2017-03-04 MED ORDER — ROCURONIUM BROMIDE 100 MG/10ML IV SOLN
INTRAVENOUS | Status: DC | PRN
  Administered 2017-03-04: 50 mg via INTRAVENOUS

## 2017-03-04 MED ORDER — OXYMETAZOLINE HCL 0.05 % NA SOLN
1.0000 | Freq: Two times a day (BID) | NASAL | Status: DC
  Administered 2017-03-04: 1 via NASAL
  Filled 2017-03-04: qty 15

## 2017-03-04 MED ORDER — FENTANYL CITRATE (PF) 100 MCG/2ML IJ SOLN
INTRAMUSCULAR | Status: DC | PRN
  Administered 2017-03-04: 50 ug via INTRAVENOUS
  Administered 2017-03-04: 75 ug via INTRAVENOUS
  Administered 2017-03-04: 50 ug via INTRAVENOUS
  Administered 2017-03-04: 75 ug via INTRAVENOUS
  Administered 2017-03-04: 50 ug via INTRAVENOUS

## 2017-03-04 MED ORDER — OXYMETAZOLINE HCL 0.05 % NA SOLN
NASAL | Status: DC | PRN
  Administered 2017-03-04: 1

## 2017-03-04 MED ORDER — LACTATED RINGERS IV SOLN
INTRAVENOUS | Status: DC | PRN
  Administered 2017-03-04 (×2): via INTRAVENOUS

## 2017-03-04 MED ORDER — MORPHINE SULFATE (PF) 4 MG/ML IV SOLN: 2.0000 mg | INTRAVENOUS | Status: DC | PRN

## 2017-03-04 MED ORDER — CHLORHEXIDINE GLUCONATE CLOTH 2 % EX PADS: 6.0000 | MEDICATED_PAD | Freq: Once | CUTANEOUS | Status: DC

## 2017-03-04 MED ORDER — PHENOL 1.4 % MT LIQD: 1.0000 | OROMUCOSAL | Status: DC | PRN

## 2017-03-04 MED ORDER — DEXTROSE IN LACTATED RINGERS 5 % IV SOLN: INTRAVENOUS | Status: DC

## 2017-03-04 MED ORDER — PROPOFOL 10 MG/ML IV BOLUS
INTRAVENOUS | Status: AC
  Filled 2017-03-04: qty 20

## 2017-03-04 MED ORDER — LIDOCAINE HCL (CARDIAC) 20 MG/ML IV SOLN
INTRAVENOUS | Status: DC | PRN
  Administered 2017-03-04: 80 mg via INTRAVENOUS

## 2017-03-04 MED ORDER — MIDAZOLAM HCL 5 MG/5ML IJ SOLN
INTRAMUSCULAR | Status: DC | PRN
  Administered 2017-03-04: 2 mg via INTRAVENOUS

## 2017-03-04 MED ORDER — ONDANSETRON HCL 4 MG/2ML IJ SOLN
INTRAMUSCULAR | Status: DC | PRN
  Administered 2017-03-04: 4 mg via INTRAVENOUS

## 2017-03-04 MED ORDER — ENOXAPARIN SODIUM 30 MG/0.3ML ~~LOC~~ SOLN: 30.0000 mg | SUBCUTANEOUS | Status: DC

## 2017-03-04 MED ORDER — SUCCINYLCHOLINE CHLORIDE 200 MG/10ML IV SOSY
PREFILLED_SYRINGE | INTRAVENOUS | Status: AC
  Filled 2017-03-04: qty 10

## 2017-03-04 MED ORDER — DEXAMETHASONE SODIUM PHOSPHATE 10 MG/ML IJ SOLN
INTRAMUSCULAR | Status: AC
  Filled 2017-03-04: qty 1

## 2017-03-04 MED ORDER — METFORMIN HCL ER 500 MG PO TB24: 2000.0000 mg | ORAL_TABLET | Freq: Every day | ORAL | Status: DC

## 2017-03-04 MED ORDER — FENTANYL CITRATE (PF) 250 MCG/5ML IJ SOLN
INTRAMUSCULAR | Status: AC
  Filled 2017-03-04: qty 5

## 2017-03-04 MED ORDER — LIDOCAINE 2% (20 MG/ML) 5 ML SYRINGE
INTRAMUSCULAR | Status: AC
  Filled 2017-03-04: qty 5

## 2017-03-04 MED ORDER — SUGAMMADEX SODIUM 500 MG/5ML IV SOLN
INTRAVENOUS | Status: AC
  Filled 2017-03-04: qty 5

## 2017-03-04 MED ORDER — ONDANSETRON HCL 4 MG/2ML IJ SOLN: 4.0000 mg | INTRAMUSCULAR | Status: DC | PRN

## 2017-03-04 MED ORDER — HYDROMORPHONE HCL 1 MG/ML IJ SOLN
0.2500 mg | INTRAMUSCULAR | Status: DC | PRN
  Administered 2017-03-04 (×2): 0.5 mg via INTRAVENOUS

## 2017-03-04 MED ORDER — OXYCODONE HCL 5 MG/5ML PO SOLN: 5.0000 mg | Freq: Once | ORAL | Status: DC | PRN

## 2017-03-04 MED ORDER — MUPIROCIN 2 % EX OINT
TOPICAL_OINTMENT | CUTANEOUS | Status: AC
  Filled 2017-03-04: qty 22

## 2017-03-04 MED ORDER — SCOPOLAMINE 1 MG/3DAYS TD PT72
MEDICATED_PATCH | TRANSDERMAL | Status: AC
  Filled 2017-03-04: qty 1

## 2017-03-04 MED ORDER — MIDAZOLAM HCL 2 MG/2ML IJ SOLN
INTRAMUSCULAR | Status: AC
  Filled 2017-03-04: qty 2

## 2017-03-04 MED ORDER — MUPIROCIN CALCIUM 2 % EX CREA
TOPICAL_CREAM | CUTANEOUS | Status: AC
  Filled 2017-03-04: qty 15

## 2017-03-04 MED ORDER — IBUPROFEN 100 MG/5ML PO SUSP
600.0000 mg | Freq: Four times a day (QID) | ORAL | Status: DC | PRN
  Filled 2017-03-04: qty 30

## 2017-03-04 MED ORDER — DEXAMETHASONE SODIUM PHOSPHATE 10 MG/ML IJ SOLN
INTRAMUSCULAR | Status: DC | PRN
  Administered 2017-03-04: 10 mg via INTRAVENOUS

## 2017-03-04 MED ORDER — ROCURONIUM BROMIDE 10 MG/ML (PF) SYRINGE
PREFILLED_SYRINGE | INTRAVENOUS | Status: AC
  Filled 2017-03-04: qty 5

## 2017-03-04 MED ORDER — HYDROMORPHONE HCL 1 MG/ML IJ SOLN
INTRAMUSCULAR | Status: AC
  Filled 2017-03-04: qty 1

## 2017-03-04 MED ORDER — SCOPOLAMINE 1 MG/3DAYS TD PT72
MEDICATED_PATCH | TRANSDERMAL | Status: DC | PRN
  Administered 2017-03-04: 1 via TRANSDERMAL

## 2017-03-04 MED ORDER — DEXAMETHASONE SODIUM PHOSPHATE 10 MG/ML IJ SOLN
10.0000 mg | Freq: Once | INTRAMUSCULAR | Status: AC
  Administered 2017-03-04: 10 mg via INTRAVENOUS
  Filled 2017-03-04: qty 1

## 2017-03-04 MED ORDER — ARTIFICIAL TEARS OPHTHALMIC OINT
TOPICAL_OINTMENT | OPHTHALMIC | Status: DC | PRN
  Administered 2017-03-04: 1 via OPHTHALMIC

## 2017-03-04 MED ORDER — LIDOCAINE-EPINEPHRINE 2 %-1:100000 IJ SOLN
INTRAMUSCULAR | Status: AC
  Filled 2017-03-04: qty 1

## 2017-03-04 MED ORDER — SUGAMMADEX SODIUM 200 MG/2ML IV SOLN
INTRAVENOUS | Status: DC | PRN
  Administered 2017-03-04: 350 mg via INTRAVENOUS

## 2017-03-04 MED ORDER — PROPOFOL 10 MG/ML IV BOLUS
INTRAVENOUS | Status: DC | PRN
  Administered 2017-03-04: 150 mg via INTRAVENOUS
  Administered 2017-03-04: 50 mg via INTRAVENOUS

## 2017-03-04 MED ORDER — SUCCINYLCHOLINE CHLORIDE 20 MG/ML IJ SOLN
INTRAMUSCULAR | Status: DC | PRN
  Administered 2017-03-04: 100 mg via INTRAVENOUS

## 2017-03-04 MED ORDER — LIDOCAINE-EPINEPHRINE 1 %-1:100000 IJ SOLN
INTRAMUSCULAR | Status: AC
  Filled 2017-03-04: qty 1

## 2017-03-04 MED ORDER — OXYCODONE HCL 5 MG PO TABS: 5.0000 mg | ORAL_TABLET | Freq: Once | ORAL | Status: DC | PRN

## 2017-03-04 MED ORDER — ONDANSETRON HCL 4 MG/2ML IJ SOLN
INTRAMUSCULAR | Status: AC
  Filled 2017-03-04: qty 2

## 2017-03-04 MED ORDER — LIDOCAINE-EPINEPHRINE 1 %-1:100000 IJ SOLN
INTRAMUSCULAR | Status: DC | PRN
  Administered 2017-03-04: 8 mL

## 2017-03-04 MED ORDER — MUPIROCIN 2 % EX OINT
TOPICAL_OINTMENT | CUTANEOUS | Status: DC | PRN
  Administered 2017-03-04: 1 via NASAL

## 2017-03-04 MED ORDER — 0.9 % SODIUM CHLORIDE (POUR BTL) OPTIME
TOPICAL | Status: DC | PRN
  Administered 2017-03-04: 1000 mL

## 2017-03-04 MED ORDER — ONDANSETRON HCL 4 MG PO TABS: 4.0000 mg | ORAL_TABLET | ORAL | Status: DC | PRN

## 2017-03-04 MED ORDER — HYDROCODONE-ACETAMINOPHEN 7.5-325 MG/15ML PO SOLN: 10.0000 mL | Freq: Four times a day (QID) | ORAL | 0 refills | Status: DC | PRN

## 2017-03-04 MED ORDER — HYDROCODONE-ACETAMINOPHEN 7.5-325 MG/15ML PO SOLN
10.0000 mL | Freq: Four times a day (QID) | ORAL | Status: DC | PRN
  Administered 2017-03-04: 15 mL via ORAL
  Filled 2017-03-04: qty 15

## 2017-03-04 MED ORDER — AMOXICILLIN-POT CLAVULANATE 250-62.5 MG/5ML PO SUSR: 10.0000 mL | Freq: Two times a day (BID) | ORAL | 0 refills | Status: DC

## 2017-03-04 SURGICAL SUPPLY — 37 items
BLADE SURG 15 STRL LF DISP TIS (BLADE) ×2 IMPLANT
BLADE SURG 15 STRL SS (BLADE) ×1
CANISTER SUCT 3000ML PPV (MISCELLANEOUS) ×3 IMPLANT
CATH ROBINSON RED A/P 12FR (CATHETERS) ×3 IMPLANT
CLEANER TIP ELECTROSURG 2X2 (MISCELLANEOUS) ×3 IMPLANT
COAGULATOR SUCT 8FR VV (MISCELLANEOUS) IMPLANT
COAGULATOR SUCT SWTCH 10FR 6 (ELECTROSURGICAL) ×3 IMPLANT
CONT SPEC 4OZ CLIKSEAL STRL BL (MISCELLANEOUS) ×6 IMPLANT
DRAPE HALF SHEET 40X57 (DRAPES) IMPLANT
ELECT COATED BLADE 2.86 ST (ELECTRODE) ×3 IMPLANT
ELECT REM PT RETURN 9FT ADLT (ELECTROSURGICAL)
ELECT REM PT RETURN 9FT PED (ELECTROSURGICAL)
ELECTRODE REM PT RETRN 9FT PED (ELECTROSURGICAL) IMPLANT
ELECTRODE REM PT RTRN 9FT ADLT (ELECTROSURGICAL) IMPLANT
GAUZE SPONGE 2X2 8PLY STRL LF (GAUZE/BANDAGES/DRESSINGS) ×2 IMPLANT
GLOVE BIOGEL M 7.0 STRL (GLOVE) ×6 IMPLANT
GOWN STRL REUS W/ TWL LRG LVL3 (GOWN DISPOSABLE) ×4 IMPLANT
GOWN STRL REUS W/TWL LRG LVL3 (GOWN DISPOSABLE) ×2
KIT BASIN OR (CUSTOM PROCEDURE TRAY) ×3 IMPLANT
KIT ROOM TURNOVER OR (KITS) ×3 IMPLANT
NEEDLE HYPO 25GX1X1/2 BEV (NEEDLE) ×3 IMPLANT
NS IRRIG 1000ML POUR BTL (IV SOLUTION) ×3 IMPLANT
PAD ARMBOARD 7.5X6 YLW CONV (MISCELLANEOUS) ×6 IMPLANT
PENCIL BUTTON HOLSTER BLD 10FT (ELECTRODE) ×3 IMPLANT
SPECIMEN JAR SMALL (MISCELLANEOUS) ×6 IMPLANT
SPLINT NASAL DOYLE BI-VL (GAUZE/BANDAGES/DRESSINGS) ×3 IMPLANT
SPONGE GAUZE 2X2 STER 10/PKG (GAUZE/BANDAGES/DRESSINGS) ×1
SPONGE NEURO XRAY DETECT 1X3 (DISPOSABLE) ×3 IMPLANT
SPONGE TONSIL 1 RF SGL (DISPOSABLE) ×3 IMPLANT
SUT ETHILON 3 0 PS 1 (SUTURE) ×3 IMPLANT
SUT PLAIN 4 0 ~~LOC~~ 1 (SUTURE) ×3 IMPLANT
TOWEL OR 17X24 6PK STRL BLUE (TOWEL DISPOSABLE) ×6 IMPLANT
TRAY ENT MC OR (CUSTOM PROCEDURE TRAY) ×3 IMPLANT
TUBE SALEM SUMP 14F W/ARV (TUBING) ×3 IMPLANT
TUBE SALEM SUMP 16 FR W/ARV (TUBING) ×3 IMPLANT
TUBING EXTENTION W/L.L. (IV SETS) ×3 IMPLANT
YANKAUER SUCT BULB TIP NO VENT (SUCTIONS) ×3 IMPLANT

## 2017-03-04 NOTE — Transfer of Care (Signed)
Immediate Anesthesia Transfer of Care Note  Patient: [name]  Procedure(s) Performed: Procedure(s): NASAL SEPTOPLASTY WITH BILATERAL INFERIOR TURBINATE REDUCTION (Bilateral) TONSILLECTOMY (Bilateral)  Patient Location: PACU  Anesthesia Type:General  Level of Consciousness: awake, alert  and oriented  Airway & Oxygen Therapy: Patient Spontanous Breathing and Patient connected to nasal cannula oxygen  Post-op Assessment: Report given to RN, Post -op Vital signs reviewed and stable and Patient moving all extremities X 4  Post vital signs: Reviewed and stable  Last Vitals:  Vitals:   03/04/17 0554  BP: (!) 144/90  Pulse: 81  Resp: 18  Temp: 36.9 C  SpO2: 100%    Last Pain:  Vitals:   03/04/17 0554  TempSrc: Oral      Patients Stated Pain Goal: 3 (67/34/19 3790)  Complications: No apparent anesthesia complications

## 2017-03-04 NOTE — Anesthesia Preprocedure Evaluation (Addendum)
Anesthesia Evaluation  Patient identified by MRN, date of birth, ID band Patient awake    Reviewed: Allergy & Precautions, NPO status , Patient's Chart, lab work & pertinent test results  History of Anesthesia Complications (+) PONV  Airway Mallampati: II  TM Distance: >3 FB Neck ROM: full    Dental  (+) Teeth Intact, Dental Advidsory Given   Pulmonary    breath sounds clear to auscultation       Cardiovascular  Rhythm:regular Rate:Normal     Neuro/Psych    GI/Hepatic   Endo/Other  Morbid obesityMassively obese  Renal/GU      Musculoskeletal   Abdominal   Peds  Hematology   Anesthesia Other Findings   Reproductive/Obstetrics                           Anesthesia Physical Anesthesia Plan  ASA: III  Anesthesia Plan: General   Post-op Pain Management:    Induction:   PONV Risk Score and Plan:   Airway Management Planned:   Additional Equipment:   Intra-op Plan:   Post-operative Plan: Extubation in OR and Possible Post-op intubation/ventilation  Informed Consent:   Dental Advisory Given  Plan Discussed with: Anesthesiologist, CRNA and Surgeon  Anesthesia Plan Comments:        Anesthesia Quick Evaluation

## 2017-03-04 NOTE — Op Note (Signed)
[name], [name]               ACCOUNT NO.:  [id number]  MEDICAL RECORD NO.:  [id number]  LOCATION:  [facility]                         FACILITY:  [facility]  PHYSICIAN:  Early Chars. Wilburn Cornelia, M.D.DATE OF BIRTH:  05-02-1986  DATE OF PROCEDURE:  03/04/2017 DATE OF DISCHARGE:                              OPERATIVE REPORT   PREOPERATIVE DIAGNOSES: 1. Severe nasal septal deviation with airway obstruction. 2. Bilateral inferior turbinate hypertrophy. 3. Bilateral tonsillar hypertrophy.  POSTOPERATIVE DIAGNOSES: 1. Severe nasal septal deviation with airway obstruction. 2. Bilateral inferior turbinate hypertrophy. 3. Bilateral tonsillar hypertrophy.  INDICATION FOR SURGERY: 1. Severe nasal septal deviation with airway obstruction. 2. Bilateral inferior turbinate hypertrophy. 3. Bilateral tonsillar hypertrophy.  SURGICAL PROCEDURE: 1. Nasal septoplasty. 2. Bilateral inferior turbinate reduction. 3. Tonsillectomy.  ANESTHESIA:  General endotracheal.  COMPLICATIONS:  No complications.  ESTIMATED BLOOD LOSS:  Less than 100 mL.  DISPOSITION:  The patient transferred from the operating room to recovery room in stable condition.  BRIEF HISTORY:  The patient is a 31 year old female, who was referred to our office for evaluation of chronic airway obstruction, recurrent sore throats, and nasal airway blockage.  Examination showed 3+ cryptic tonsils and nasal airway obstruction from severely deviated septum and turbinate hypertrophy.  The patient has a history of having nighttime snoring and possible mild sleep apnea.  Given her anatomic findings, I recommended that we undertake the above airway surgeries to improve her airway patency.  The risks and benefits of procedures were discussed in detail with the patient and her husband.  They understood and agreed with our plan for surgery, which is scheduled on an elective basis at [facility].  DESCRIPTION OF PROCEDURE:  The  patient was brought to the operating room on March 04, 2017, and placed in the supine position on the operating table.  General endotracheal anesthesia was established without difficulty.  When the patient was adequately anesthetized, her nose was examined and then injected with a total of 7 mL of 1% lidocaine with 1:100,000 dilution epinephrine, which was injected in a submucosal fashion along the nasal septum and inferior turbinates bilaterally.  The patient's nose was then packed with Afrin-soaked cottonoid pledgets.  It was left in place for approximately 10 minutes to allow for vasoconstriction and hemostasis.  The surgical procedure was begun with a surgical time-out with correct identification of the patient and the surgical procedure.  She was prepped, draped, and prepared for surgery.  Nasal septoplasty was undertaken by creating a left anterior hemitransfixion incision.  It was carried through the mucosa, underlying submucosa, and a mucoperichondrial flap was elevated from anterior to posterior on the left-hand side.  The anterior cartilaginous septum was crossed and a mucoperichondrial flap was elevated on the patient's right.  She had a severely deviated nasal septum with nasal septal spurring and obstruction of the nasal passageway.  Using blunt and sharp dissection, and Thru-cutting forceps, deviated bone and cartilage were resected.  The mid and anterior septal cartilage were removed.  This was morselized and returned to the mucoperichondrial pocket at the conclusion of the surgical procedure.  Dissection was then carried from anterior to posterior removing deviated bone and cartilage and  preserving the overlying mucosa.  The septum was brought to a good midline position.  The morselized cartilage was returned to the mucoperichondrial pocket and the flaps were reapproximated with a 4-0 gut suture on a Keith needle in a horizontal mattressing fashion.  The anterior  hemitransfixion incision was closed with the same stitch and bilateral Doyle nasal septal splints were placed after the application of Bactroban ointment and sutured in position with a 3-0 Ethilon suture.  Inferior turbinate reduction was then performed with bipolar cautery set at 12 watts.  Two submucosal passes were made in each inferior turbinate.  With the turbinates been adequately cauterized, anterior incisions were created, overlying soft tissue elevated, and a small amount of turbinate bone was resected.  The turbinates were then outfractured creating more patent nasal passageway.  Attention was then turned to the patient's oropharynx.  A Crowe-Davis mouth gag was inserted without difficulty.  There were no loose or broken teeth.  The hard and soft palate were intact.  Tonsillectomy was then performed using Bovie electrocautery set at 35 watts, dissecting on the left-hand side in a subcapsular fashion.  The entire left tonsil was removed from superior pole to tongue base.  Right tonsil was removed in a similar fashion.  The tonsillar tissue was sent to Pathology for gross microscopic evaluation.  The tonsillar fossae were gently abraded with a dry tonsil sponge and several small areas of hemorrhage were then cauterized with suction cautery.  Using the suction cautery, the patient's excessive lingual tonsil tissue was also ablated to reduce risk of long-term airway obstruction.  The patient's oral cavity and oropharynx were irrigated and suctioned.  A Crowe-Davis mouth gag was released and reapplied.  There was no active bleeding.  An orogastric tube was passed and stomach contents were aspirated.  The Crowe-Davis mouth gag was released and removed.  Again, no loose or broken teeth and no bleeding.  The patient was awakened from anesthetic.  She was extubated and then transferred from the operating room to the recovery room in stable condition.  There were no  complications.  ESTIMATED BLOOD LOSS:  Less than 100 mL.          ______________________________ Early Chars. Wilburn Cornelia, M.D.     DLS/MEDQ  D:  89/16/9450  T:  03/04/2017  Job:  [id number]

## 2017-03-04 NOTE — Anesthesia Postprocedure Evaluation (Signed)
Anesthesia Post Note  Patient: [name]  Procedure(s) Performed: Procedure(s) (LRB): NASAL SEPTOPLASTY WITH BILATERAL INFERIOR TURBINATE REDUCTION (Bilateral) TONSILLECTOMY (Bilateral)     Patient location during evaluation: PACU Anesthesia Type: General Level of consciousness: awake and sedated Pain management: pain level controlled Vital Signs Assessment: post-procedure vital signs reviewed and stable Respiratory status: spontaneous breathing, nonlabored ventilation, respiratory function stable and patient connected to nasal cannula oxygen Cardiovascular status: blood pressure returned to baseline and stable Postop Assessment: no apparent nausea or vomiting Anesthetic complications: no    Last Vitals:  Vitals:   03/04/17 1000 03/04/17 1028  BP:  139/72  Pulse: 75 77  Resp: 13 14  Temp:  36.9 C  SpO2: 96% 97%    Last Pain:  Vitals:   03/04/17 1231  TempSrc:   PainSc: 6                  Kruz Chiu,JAMES TERRILL

## 2017-03-04 NOTE — Brief Op Note (Signed)
03/04/2017  9:02 AM  PATIENT:  [name]  31 y.o. female  PRE-OPERATIVE DIAGNOSIS:  DEVIATED SEPTUM,NOSAL TUBINATE HYPERTROPHY,TONSILAR HYPERTROPHY  POST-OPERATIVE DIAGNOSIS:  DEVIATED SEPTUM,NOSAL TUBINATE   PROCEDURE:  Procedure(s): NASAL SEPTOPLASTY WITH BILATERAL INFERIOR TURBINATE REDUCTION (Bilateral) TONSILLECTOMY (Bilateral)  SURGEON:  Surgeon(s) and Role:    Jerrell Belfast, MD - Primary  PHYSICIAN ASSISTANT:   ASSISTANTS: none   ANESTHESIA:   general  EBL:  Total I/O In: 1000 [I.V.:1000] Out: - 100cc  BLOOD ADMINISTERED:none  DRAINS: none   LOCAL MEDICATIONS USED:  LIDOCAINE  and Amount: 7 ml  SPECIMEN:  Source of Specimen:  tonsils  DISPOSITION OF SPECIMEN:  PATHOLOGY  COUNTS:  YES  TOURNIQUET:  * No tourniquets in log *  DICTATION: .Other Dictation: Dictation Number [id number]  PLAN OF CARE: Admit for overnight observation  PATIENT DISPOSITION:  PACU - hemodynamically stable.   Delay start of Pharmacological VTE agent (>24hrs) due to surgical blood loss or risk of bleeding: not applicable

## 2017-03-04 NOTE — H&P (Signed)
[name] is an 31 y.o. female.   Chief Complaint: Nasal obstruction, Tonsil hypertrophy HPI: Hx of Deviated septum and Tonsil hypertrophy with nasal obstruction  Past Medical History:  Diagnosis Date  . Anemia   . BPES syndrome   . Enlarged thyroid   . Palpitations   . PCOS (polycystic ovarian syndrome)   . PONV (postoperative nausea and vomiting)   . Premature ovarian failure   . Wears eyeglasses     Past Surgical History:  Procedure Laterality Date  . EYE SURGERY Bilateral     Family History  Problem Relation Age of Onset  . Hyperlipidemia Father   . Hypertension Father   . Heart disease Father   . Sudden death Father        Age 55  . Other Father        BPES  . Heart attack Father   . Other Other        BPES  . Other Other        BPES  . Hyperlipidemia Mother   . Hypertension Mother   . Diabetes Mother   . Other Brother        BPES  . Other Sister        BPES  . Other Paternal Uncle        BPES  . Other Cousin        BPES  . Other Cousin        BPES  . Heart attack Maternal Grandmother   . Heart disease Maternal Grandfather   . Heart attack Maternal Grandfather   . Lung cancer Maternal Grandfather   . Heart disease Paternal Grandfather heartr attack  . Heart attack Paternal Grandfather 15       MI  . Sudden death Paternal Grandfather    Social History:  reports that she has never smoked. She has never used smokeless tobacco. She reports that she drinks alcohol. She reports that she does not use drugs.  Allergies: No Known Allergies  Medications Prior to Admission  Medication Sig Dispense Refill  . Cholecalciferol 5000 units capsule Take 1 capsule (5,000 Units total) by mouth daily. With meal 30 capsule 11  . Cyanocobalamin 2500 MCG SUBL Place 2,500 mcg under the tongue daily.     . fluticasone (FLONASE) 50 MCG/ACT nasal spray Place 1 spray into the nose daily.     [name] HEATHER 0.35 MG tablet Take 1 tablet by mouth daily.   4  . metFORMIN  (GLUCOPHAGE-XR) 500 MG 24 hr tablet Take 4 tablets (2,000 mg total) by mouth daily. 360 tablet 1  . Multiple Vitamin (MULTIVITAMIN) capsule Take 1 capsule by mouth daily.      Results for orders placed or performed during the hospital encounter of 03/04/17 (from the past 48 hour(s))  Glucose, capillary     Status: None   Collection Time: 03/04/17  5:52 AM  Result Value Ref Range   Glucose-Capillary 96 65 - 99 mg/dL   No results found.  Review of Systems  Constitutional: Negative.   HENT: Positive for congestion.     Blood pressure (!) 144/90, pulse 81, temperature 98.4 F (36.9 C), temperature source Oral, resp. rate 18, height 5\' 9"  (1.753 m), weight (!) 162.4 kg (358 lb), last menstrual period 10/31/2016, SpO2 100 %. Physical Exam  Constitutional: She appears well-developed and well-nourished.  HENT:  Deviated septum Tonsil hypertrophy  Neck: Normal range of motion. Neck supple.  Cardiovascular: Normal rate.   Respiratory: Effort  normal.     Assessment/Plan Adm for OP T&A, septoplasty and IT reduction.  Rosaly Labarbera, MD 03/04/2017, 7:28 AM

## 2017-03-04 NOTE — Anesthesia Procedure Notes (Signed)
Procedure Name: Intubation Date/Time: 03/04/2017 7:45 AM Performed by: Neldon Newport Pre-anesthesia Checklist: Timeout performed, Patient being monitored, Suction available, Emergency Drugs available and Patient identified Patient Re-evaluated:Patient Re-evaluated prior to induction Oxygen Delivery Method: Circle system utilized Preoxygenation: Pre-oxygenation with 100% oxygen Induction Type: IV induction Ventilation: Mask ventilation without difficulty and Oral airway inserted - appropriate to patient size Laryngoscope Size: Mac and 3 Grade View: Grade I Tube type: Oral Tube size: 7.0 mm Number of attempts: 1 Placement Confirmation: breath sounds checked- equal and bilateral,  positive ETCO2 and ETT inserted through vocal cords under direct vision Secured at: 21 cm Tube secured with: Tape Dental Injury: Teeth and Oropharynx as per pre-operative assessment

## 2017-03-04 NOTE — Progress Notes (Signed)
Discharge home. Home discharge instruction given, no question verbalized. 

## 2017-03-05 ENCOUNTER — Encounter (HOSPITAL_COMMUNITY): Payer: Self-pay | Admitting: Otolaryngology

## 2017-03-31 ENCOUNTER — Ambulatory Visit: Payer: 59 | Admitting: Psychology

## 2017-05-06 ENCOUNTER — Ambulatory Visit (INDEPENDENT_AMBULATORY_CARE_PROVIDER_SITE_OTHER): Payer: 59 | Admitting: Psychology

## 2017-05-06 DIAGNOSIS — F411 Generalized anxiety disorder: Secondary | ICD-10-CM

## 2017-05-25 ENCOUNTER — Encounter: Payer: Self-pay | Admitting: Family Medicine

## 2017-05-25 ENCOUNTER — Ambulatory Visit (INDEPENDENT_AMBULATORY_CARE_PROVIDER_SITE_OTHER): Payer: 59 | Admitting: Family Medicine

## 2017-05-25 VITALS — BP 124/76 | HR 90 | Temp 97.9°F | Resp 20 | Ht 69.0 in | Wt 356.0 lb

## 2017-05-25 DIAGNOSIS — Z0001 Encounter for general adult medical examination with abnormal findings: Secondary | ICD-10-CM

## 2017-05-25 DIAGNOSIS — D519 Vitamin B12 deficiency anemia, unspecified: Secondary | ICD-10-CM

## 2017-05-25 DIAGNOSIS — E559 Vitamin D deficiency, unspecified: Secondary | ICD-10-CM | POA: Diagnosis not present

## 2017-05-25 DIAGNOSIS — M533 Sacrococcygeal disorders, not elsewhere classified: Secondary | ICD-10-CM | POA: Insufficient documentation

## 2017-05-25 DIAGNOSIS — R7303 Prediabetes: Secondary | ICD-10-CM | POA: Diagnosis not present

## 2017-05-25 DIAGNOSIS — Z6841 Body Mass Index (BMI) 40.0 and over, adult: Secondary | ICD-10-CM | POA: Diagnosis not present

## 2017-05-25 DIAGNOSIS — Z01419 Encounter for gynecological examination (general) (routine) without abnormal findings: Secondary | ICD-10-CM | POA: Diagnosis not present

## 2017-05-25 LAB — CBC WITH DIFFERENTIAL/PLATELET
BASOS ABS: 0.1 10*3/uL (ref 0.0–0.1)
Basophils Relative: 1 % (ref 0.0–3.0)
Eosinophils Absolute: 0.2 10*3/uL (ref 0.0–0.7)
Eosinophils Relative: 4.1 % (ref 0.0–5.0)
HCT: 40.3 % (ref 36.0–46.0)
HEMOGLOBIN: 12.8 g/dL (ref 12.0–15.0)
LYMPHS ABS: 1.7 10*3/uL (ref 0.7–4.0)
Lymphocytes Relative: 28.2 % (ref 12.0–46.0)
MCHC: 31.8 g/dL (ref 30.0–36.0)
MCV: 86.2 fl (ref 78.0–100.0)
MONOS PCT: 9.6 % (ref 3.0–12.0)
Monocytes Absolute: 0.6 10*3/uL (ref 0.1–1.0)
NEUTROS PCT: 57.1 % (ref 43.0–77.0)
Neutro Abs: 3.4 10*3/uL (ref 1.4–7.7)
Platelets: 243 10*3/uL (ref 150.0–400.0)
RBC: 4.68 Mil/uL (ref 3.87–5.11)
RDW: 14.6 % (ref 11.5–15.5)
WBC: 5.9 10*3/uL (ref 4.0–10.5)

## 2017-05-25 LAB — TSH: TSH: 1.97 u[IU]/mL (ref 0.35–4.50)

## 2017-05-25 LAB — COMPREHENSIVE METABOLIC PANEL
ALBUMIN: 4.2 g/dL (ref 3.5–5.2)
ALK PHOS: 52 U/L (ref 39–117)
ALT: 17 U/L (ref 0–35)
AST: 18 U/L (ref 0–37)
BILIRUBIN TOTAL: 0.4 mg/dL (ref 0.2–1.2)
BUN: 10 mg/dL (ref 6–23)
CO2: 26 mEq/L (ref 19–32)
CREATININE: 0.66 mg/dL (ref 0.40–1.20)
Calcium: 9 mg/dL (ref 8.4–10.5)
Chloride: 103 mEq/L (ref 96–112)
GFR: 111.99 mL/min (ref >60.00)
Glucose, Bld: 89 mg/dL (ref 70–99)
Potassium: 4.5 mEq/L (ref 3.5–5.1)
SODIUM: 138 meq/L (ref 135–145)
TOTAL PROTEIN: 6.9 g/dL (ref 6.0–8.3)

## 2017-05-25 LAB — LIPID PANEL
CHOLESTEROL: 144 mg/dL (ref 0–200)
HDL: 43.1 mg/dL (ref >39.00)
LDL CALC: 80 mg/dL (ref 0–99)
NonHDL: 100.99
Total CHOL/HDL Ratio: 3
Triglycerides: 103 mg/dL (ref 0.0–149.0)
VLDL: 20.6 mg/dL (ref 0.0–40.0)

## 2017-05-25 LAB — VITAMIN B12: Vitamin B-12: 477 pg/mL (ref 211–911)

## 2017-05-25 LAB — HEMOGLOBIN A1C: HEMOGLOBIN A1C: 5.7 % (ref 4.6–6.5)

## 2017-05-25 LAB — VITAMIN D 25 HYDROXY (VIT D DEFICIENCY, FRACTURES): VITD: 27.98 ng/mL — AB (ref 30.00–100.00)

## 2017-05-25 NOTE — Patient Instructions (Signed)

## 2017-05-25 NOTE — Progress Notes (Signed)
Patient ID: PAMLEA FINDER, female  DOB: 02/06/1986, 31 y.o.   MRN: [id number] Patient Care Team    Relationship Specialty Notifications Start End  Ma Hillock, DO PCP - General Family Medicine  04/25/15   Servando Salina, MD Consulting Physician Obstetrics and Gynecology  06/18/16   Wellington Hampshire, MD Consulting Physician Cardiology  06/18/16     Subjective:  [name] is a 31 y.o.  Female  present for CPE. All past medical history, surgical history, allergies, family history, immunizations, medications and social history were updated in the electronic medical record today. All recent labs, ED visits and hospitalizations within the last year were reviewed.  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 She is now searching for a job in the area.   Coccydynia: tailbone after long hours of sitting in lat October. She feels she has to lean forward or sitting for longer periods of time can cause pain. She denies fever, chills, nausea, injury or skin break. She denies discomfort with bowel movements.   Health maintenance:  Colonoscopy: No Fhx. Screen 50 Mammogram: no fhx screen 42 Cervical cancer screening: last pap: 04/2016, results: normal, completed by: Dr. Garwin Brothers Immunizations: tdap 05/2015 UTD, Influenza 2018 UTD(encouraged yearly),  Infectious disease screening: HIV UTD DEXA:  Consider early screen with BPES, low vit D Assistive device: None  Oxygen use: None Patient has a Dental home. Hospitalizations/ED visits: reviewed  Immunization History  Administered Date(s) Administered  . HPV 9-valent 08/01/2014, 09/10/2014, 01/28/2015  . Influenza,inj,Quad PF,6+ Mos 03/21/2014  . Influenza-Unspecified 03/22/2015  . Tdap 08/01/2005, 06/09/2015     Past Medical History:  Diagnosis Date  . Anemia   . BPES syndrome   . Enlarged thyroid   . Palpitations   . PCOS (polycystic ovarian syndrome)   . PONV (postoperative nausea and vomiting)   . Premature ovarian  failure   . Wears eyeglasses    No Known Allergies Past Surgical History:  Procedure Laterality Date  . EYE SURGERY Bilateral   . NASAL SEPTOPLASTY W/ TURBINOPLASTY Bilateral 03/04/2017   Procedure: NASAL SEPTOPLASTY WITH BILATERAL INFERIOR TURBINATE REDUCTION;  Surgeon: Jerrell Belfast, MD;  Location: [facility];  Service: ENT;  Laterality: Bilateral;  . NASAL SEPTUM SURGERY  03/04/2017  . TONSILLECTOMY AND ADENOIDECTOMY Bilateral 03/04/2017   Procedure: TONSILLECTOMY;  Surgeon: Jerrell Belfast, MD;  Location: [facility] OR;  Service: ENT;  Laterality: Bilateral;   Family History  Problem Relation Age of Onset  . Hyperlipidemia Father   . Hypertension Father   . Heart disease Father   . Sudden death Father        Age 37  . Other Father        BPES  . Heart attack Father   . Other Other        BPES  . Other Other        BPES  . Hyperlipidemia Mother   . Hypertension Mother   . Diabetes Mother   . Other Brother        BPES  . Other Sister        BPES  . Other Paternal Uncle        BPES  . Other Cousin        BPES  . Other Cousin        BPES  . Heart attack Maternal Grandmother   . Heart disease Maternal Grandfather   . Heart attack Maternal Grandfather   . Lung cancer Maternal Grandfather   .  Heart disease Paternal Grandfather heartr attack  . Heart attack Paternal Grandfather 59       MI  . Sudden death Paternal Grandfather    Social History   Socioeconomic History  . Marital status: Single    Spouse name: Not on file  . Number of children: 0  . Years of education: Not on file  . Highest education level: Not on file  Social Needs  . Financial resource strain: Not on file  . Food insecurity - worry: Not on file  . Food insecurity - inability: Not on file  . Transportation needs - medical: Not on file  . Transportation needs - non-medical: Not on file  Occupational History  . Occupation: Programmer, multimedia: [employer]    Comment:  step-down, med surg  Tobacco Use  .  Smoking status: Never Smoker  . Smokeless tobacco: Never Used  Substance and Sexual Activity  . Alcohol use: Yes    Alcohol/week: 0.0 oz    Comment: socially  . Drug use: No  . Sexual activity: No  Other Topics Concern  . Not on file  Social History Narrative   Ms [name] lives alone. 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   Allergies as of 05/25/2017   No Known Allergies     Medication List        Accurate as of 05/25/17  8:43 AM. Always use your most recent med list.          Cholecalciferol 5000 units capsule Take 1 capsule (5,000 Units total) by mouth daily. With meal   Cyanocobalamin 2500 MCG Subl Place 2,500 mcg under the tongue daily.   HEATHER 0.35 MG tablet Generic drug:  norethindrone Take 1 tablet by mouth daily.   metFORMIN 500 MG 24 hr tablet Commonly known as:  GLUCOPHAGE-XR Take 4 tablets (2,000 mg total) by mouth daily.   multivitamin capsule Take 1 capsule by mouth daily.      US Thyroid  Result Date: 10/20/2015 CLINICAL DATA:  Followup goiter EXAM: THYROID ULTRASOUND TECHNIQUE: Ultrasound examination of the thyroid gland and adjacent soft tissues was performed. COMPARISON:  None. FINDINGS: Right thyroid lobe Measurements: 6.0 x 2.3 x 2.5 cm. Heterogeneous echotexture without focal mass. Left thyroid lobe Measurements: 5.7 x 2.0 x 2.1 cm. Heterogeneous echotexture without focal mass. Isthmus Thickness: 4 mm.  No nodules visualized. Lymphadenopathy No abnormally enlarged lymph nodes are identified. IMPRESSION: Heterogeneous gland.  No evidence of thyroid nodule. Electronically Signed   By: Marybelle Killings M.D.   On: 10/20/2015 11:58     ROS: 14 pt review of systems performed and negative (unless mentioned in an HPI)  Objective: BP 124/76 (BP Location: Right Arm, Patient Position: Sitting, Cuff Size: Large)   Pulse 90   Temp 97.9 F (36.6 C)   Resp 20   Ht '5\' 9"'  (1.753 m)   Wt (!) 356 lb (161.5 kg)   LMP 03/28/2017   SpO2  97%   BMI 52.57 kg/m  Gen: Afebrile. No acute distress. Nontoxic in appearance. Morbidly obese. Very pleasant caucasian female.  HENT: AT. [place]. Bilateral TM visualized and normal in appearance. MMM. Bilateral nares with mild erythema and drainage. Throat without erythema or exudates. No cough or PND. No hoarseness.  Eyes:Pupils Equal Round Reactive to light, Extraocular movements intact,  Conjunctiva without redness, discharge or icterus. Neck/lymp/endocrine: Supple,no lymphadenopathy, mild (chronic) thyromegaly CV: RRR no murmur, no edema, +2/4 P posterior tibialis  pulses Chest: CTAB, no wheeze or crackles Abd: Soft. obese. NTND. BS present. no Masses palpated.  Skin: no rashes, purpura or petechiae.  Neuro/Msk:  Normal gait. PERLA. EOMi. Alert. Oriented. Cranial nerves II through XII intact. Muscle strength 5/5 U/L extremity. DTRs equal bilaterally. Psych: Normal affect, dress and demeanor. Normal speech. Normal thought content and judgment.   Assessment/plan: [name] is a 31 y.o. female present for CPE. Morbid obesity (HCC) - diet and exercise - CBC w/Diff - Comp Met (CMET) - Lipid panel - TSH Coccydynia:  - conservative therapy for now.  - decrease pressure to area.  - use a donut and NSAIDS etc.  - if worsening, redness, fever etc then be seen.   Prediabetes - last a1c 5.8>>5.1 - if stable will decrease metformin to 1000 mg QD (she had forgot to decrease) - HgB A1c  Vitamin D deficiency - Vitamin D (25 hydroxy)  Anemia due to vitamin B12 deficiency, unspecified B12 deficiency type - CBC w/Diff - Vitamin B12  Encounter for general adult medical examination with abnormal findings Patient was encouraged to exercise greater than 150 minutes a week. Patient was encouraged to choose a diet filled with fresh fruits and vegetables, and lean meats. AVS provided to patient today for education/recommendation on gender specific health and safety maintenance. Colonoscopy: No  Fhx. Screen 50 Mammogram: no fhx screen 40 Cervical cancer screening: last pap: 04/2016, results: normal, completed by: Dr. Garwin Brothers Immunizations: tdap 05/2015 UTD, Influenza 2018 UTD(encouraged yearly)  Infectious disease screening: HIV UTD DEXA:  Consider early screen with BPES, low vit D Assistive device: None  Oxygen use: None Patient has a Dental home. Hospitalizations/ED visits: reviewed   Return in about 6 months (around 11/23/2017) for prediabetes.   Electronically signed by: Howard Pouch, DO [place]

## 2017-05-26 ENCOUNTER — Telehealth: Payer: Self-pay | Admitting: Family Medicine

## 2017-05-26 ENCOUNTER — Encounter: Payer: Self-pay | Admitting: *Deleted

## 2017-05-26 MED ORDER — METFORMIN HCL ER 500 MG PO TB24: 1500.0000 mg | ORAL_TABLET | Freq: Every day | ORAL | 1 refills | Status: DC

## 2017-05-26 NOTE — Telephone Encounter (Signed)
Left detailed message with results and instructions on patient voice mail per [facility] also sent in [facility] Chart

## 2017-05-26 NOTE — Telephone Encounter (Signed)
Please call pt: - her vit d is ok, just mildly lower than normal at 28 (30 normal). Make sure she is taking her supplement daily with food. If she is doing this already maybe on extra dose a week.  Her a1c is 5.7, although higher than 6 months ago, it is still very well controlled. I have called in her metformin at a lower dose of 1500 mg a day (3 pills).  - all other labs are normal.  - followup in 6 months

## 2017-06-20 ENCOUNTER — Ambulatory Visit (INDEPENDENT_AMBULATORY_CARE_PROVIDER_SITE_OTHER): Payer: 59 | Admitting: Psychology

## 2017-06-20 DIAGNOSIS — F411 Generalized anxiety disorder: Secondary | ICD-10-CM

## 2017-07-26 ENCOUNTER — Ambulatory Visit: Payer: 59 | Admitting: Psychology

## 2017-07-26 DIAGNOSIS — F411 Generalized anxiety disorder: Secondary | ICD-10-CM

## 2017-08-19 ENCOUNTER — Encounter: Payer: Self-pay | Admitting: Family Medicine

## 2017-08-31 DIAGNOSIS — H5213 Myopia, bilateral: Secondary | ICD-10-CM | POA: Diagnosis not present

## 2017-09-01 ENCOUNTER — Ambulatory Visit: Payer: 59 | Admitting: Psychology

## 2017-09-08 ENCOUNTER — Ambulatory Visit (INDEPENDENT_AMBULATORY_CARE_PROVIDER_SITE_OTHER): Payer: 59 | Admitting: Psychology

## 2017-09-08 DIAGNOSIS — F411 Generalized anxiety disorder: Secondary | ICD-10-CM | POA: Diagnosis not present

## 2017-09-08 DIAGNOSIS — N915 Oligomenorrhea, unspecified: Secondary | ICD-10-CM | POA: Diagnosis not present

## 2017-09-28 DIAGNOSIS — F4322 Adjustment disorder with anxiety: Secondary | ICD-10-CM | POA: Diagnosis not present

## 2017-10-11 DIAGNOSIS — F4322 Adjustment disorder with anxiety: Secondary | ICD-10-CM | POA: Diagnosis not present

## 2017-10-24 ENCOUNTER — Ambulatory Visit (INDEPENDENT_AMBULATORY_CARE_PROVIDER_SITE_OTHER): Payer: 59 | Admitting: Psychology

## 2017-10-24 DIAGNOSIS — F411 Generalized anxiety disorder: Secondary | ICD-10-CM | POA: Diagnosis not present

## 2017-10-25 DIAGNOSIS — Q103 Other congenital malformations of eyelid: Secondary | ICD-10-CM | POA: Diagnosis not present

## 2017-10-25 DIAGNOSIS — N911 Secondary amenorrhea: Secondary | ICD-10-CM | POA: Diagnosis not present

## 2017-10-26 DIAGNOSIS — F4322 Adjustment disorder with anxiety: Secondary | ICD-10-CM | POA: Diagnosis not present

## 2017-10-27 ENCOUNTER — Encounter (INDEPENDENT_AMBULATORY_CARE_PROVIDER_SITE_OTHER): Payer: Self-pay

## 2017-11-02 ENCOUNTER — Ambulatory Visit (INDEPENDENT_AMBULATORY_CARE_PROVIDER_SITE_OTHER): Payer: 59 | Admitting: Family Medicine

## 2017-11-02 ENCOUNTER — Encounter (INDEPENDENT_AMBULATORY_CARE_PROVIDER_SITE_OTHER): Payer: Self-pay | Admitting: Family Medicine

## 2017-11-02 VITALS — BP 139/82 | HR 86 | Temp 98.1°F | Ht 68.0 in | Wt 363.0 lb

## 2017-11-02 DIAGNOSIS — E538 Deficiency of other specified B group vitamins: Secondary | ICD-10-CM | POA: Insufficient documentation

## 2017-11-02 DIAGNOSIS — R0602 Shortness of breath: Secondary | ICD-10-CM | POA: Diagnosis not present

## 2017-11-02 DIAGNOSIS — R5383 Other fatigue: Secondary | ICD-10-CM

## 2017-11-02 DIAGNOSIS — Z1331 Encounter for screening for depression: Secondary | ICD-10-CM | POA: Diagnosis not present

## 2017-11-02 DIAGNOSIS — R7303 Prediabetes: Secondary | ICD-10-CM | POA: Diagnosis not present

## 2017-11-02 DIAGNOSIS — Z0289 Encounter for other administrative examinations: Secondary | ICD-10-CM

## 2017-11-02 DIAGNOSIS — R030 Elevated blood-pressure reading, without diagnosis of hypertension: Secondary | ICD-10-CM | POA: Diagnosis not present

## 2017-11-02 DIAGNOSIS — Z9189 Other specified personal risk factors, not elsewhere classified: Secondary | ICD-10-CM

## 2017-11-02 DIAGNOSIS — Z6841 Body Mass Index (BMI) 40.0 and over, adult: Secondary | ICD-10-CM | POA: Diagnosis not present

## 2017-11-02 DIAGNOSIS — E559 Vitamin D deficiency, unspecified: Secondary | ICD-10-CM

## 2017-11-02 NOTE — Progress Notes (Signed)
Office: [phone]  /  Fax: [phone]   Dear Dr. Raoul Pitch,   Thank you for referring [name] to our clinic. The following note includes my evaluation and treatment recommendations.  HPI:   Chief Complaint: OBESITY    [name] has been referred by Ma Hillock, DO for consultation regarding her obesity and obesity related comorbidities.    [name] (MR# [id number]) is a 32 y.o. female who presents on 11/02/2017 for obesity evaluation and treatment. Current BMI is Body mass index is 55.19 kg/m.[name] [name] has been struggling with her weight for many years and has been unsuccessful in either losing weight, maintaining weight loss, or reaching her healthy weight goal.     Phaedra attended our information session and states she is currently in the action stage of change and ready to dedicate time achieving and maintaining a healthier weight. [name] is interested in becoming our patient and working on intensive lifestyle modifications including (but not limited to) diet, exercise and weight loss.    [name] states her family eats meals together she thinks her family will eat healthier with  her her desired weight loss is 161 lbs she has been heavy most of  her life she started gaining weight in her late teens her heaviest weight ever was 375 lbs. she has significant food cravings issues  she snacks frequently in the evenings she skips meals frequently she is frequently drinking liquids with calories she frequently makes poor food choices she has binge eating behaviors she struggles with emotional eating    [name] feels her energy is lower than it should be. This has worsened with weight gain and has not worsened recently. [name] admits to daytime somnolence and  admits to waking up still tired. Patient is at risk for obstructive sleep apnea. Patent has a history of symptoms of daytime fatigue and morning fatigue. Patient generally gets 5 to 7 hours of  sleep per night, and states they generally have restful sleep. Snoring is not present. Apneic episodes are not present. Epworth Sleepiness Score is 5  Dyspnea on exertion [name] notes increasing shortness of breath with exercising and seems to be worsening over time with weight gain. She notes getting out of breath sooner with activity than she used to. This has not gotten worse recently. [name] denies orthopnea.  Vitamin D deficiency [name] has a diagnosis of vitamin D deficiency. She is currently taking multi vitamin and OTC Vit D and last level was not at goal. [name] denies nausea, vomiting or muscle weakness.  Vitamin B12 deficiency [name] has a diagnosis in Epic of B12 anemia, but her last labs do not show anemia and she is on multi vitamin and liquid B12 for B12 intake. This does not appear to be pernicious anemia. [name] is not a vegetarian and she does not have a history of weight loss surgery.   Pre-Diabetes [name] has a diagnosis of prediabetes based on her elevated Hgb A1c and was informed this puts her at greater risk of developing diabetes. She is on metformin and notes some polyphagia. [name] is attempting to work on diet and exercise to decrease risk of diabetes. She denies nausea or hypoglycemia.  Pre-Hypertension [name] is a 32 y.o. female with borderline elevated blood pressure today. [name] denies chest pain or headache. She is ready to work on diet and weight loss to improve her blood pressure with the goal of decreasing her risk of heart attack and stroke.  At risk for cardiovascular disease [name] is at a higher than average risk for cardiovascular disease due to obesity and pre-hypertension. She currently denies any chest pain.  Depression Screen [name]'s Food and Mood (modified PHQ-9) score was  Depression screen PHQ 2/9 11/02/2017  Decreased Interest 3  Down, Depressed, Hopeless 2  PHQ - 2 Score 5  Altered sleeping 2  Tired, decreased  energy 3  Change in appetite 3  Feeling bad or failure about yourself  3  Trouble concentrating 1  Moving slowly or fidgety/restless 1  Suicidal thoughts 0  PHQ-9 Score 18  Difficult doing work/chores Somewhat difficult    ALLERGIES: No Known Allergies  MEDICATIONS: Current Outpatient Medications on File Prior to Visit  Medication Sig Dispense Refill  . Cholecalciferol 5000 units capsule Take 1 capsule (5,000 Units total) by mouth daily. With meal 30 capsule 11  . Cyanocobalamin 2500 MCG SUBL Place 2,500 mcg under the tongue daily.     [name] estradiol (CLIMARA - DOSED IN MG/24 HR) 0.1 mg/24hr patch Place 0.1 mg onto the skin. Apply and change patch twice weekly    . medroxyPROGESTERone (PROVERA) 5 MG tablet Take 5 mg by mouth. Take one tablet by mouth daily for 12 days    . metFORMIN (GLUCOPHAGE-XR) 500 MG 24 hr tablet Take 3 tablets (1,500 mg total) by mouth daily. 270 tablet 1  . Multiple Vitamin (MULTIVITAMIN) capsule Take 1 capsule by mouth daily.     No current facility-administered medications on file prior to visit.     PAST MEDICAL HISTORY: Past Medical History:  Diagnosis Date  . Anemia   . Back pain   . BPES syndrome   . Enlarged thyroid   . Infertility, female   . Obesity   . Palpitations   . Palpitations   . PCOS (polycystic ovarian syndrome)   . PONV (postoperative nausea and vomiting)   . Prediabetes   . Premature ovarian failure   . Vitamin B 12 deficiency   . Vitamin D deficiency   . Wears eyeglasses     PAST SURGICAL HISTORY: Past Surgical History:  Procedure Laterality Date  . EYE SURGERY Bilateral   . NASAL SEPTOPLASTY W/ TURBINOPLASTY Bilateral 03/04/2017   Procedure: NASAL SEPTOPLASTY WITH BILATERAL INFERIOR TURBINATE REDUCTION;  Surgeon: Jerrell Belfast, MD;  Location: [facility];  Service: ENT;  Laterality: Bilateral;  . NASAL SEPTUM SURGERY  03/04/2017  . TONSILLECTOMY AND ADENOIDECTOMY Bilateral 03/04/2017   Procedure: TONSILLECTOMY;  Surgeon:  Jerrell Belfast, MD;  Location: [facility];  Service: ENT;  Laterality: Bilateral;    SOCIAL HISTORY: Social History   Tobacco Use  . Smoking status: Never Smoker  . Smokeless tobacco: Never Used  Substance Use Topics  . Alcohol use: Yes    Alcohol/week: 0.0 oz    Comment: socially  . Drug use: No    FAMILY HISTORY: Family History  Problem Relation Age of Onset  . Hyperlipidemia Father   . Hypertension Father   . Heart disease Father   . Sudden death Father        Age 5  . Other Father        BPES  . Heart attack Father   . Other Other        BPES  . Other Other        BPES  . Hyperlipidemia Mother   . Hypertension Mother   . Diabetes Mother   . Anxiety disorder Mother   . Obesity Mother   . Other  Brother        BPES  . Other Sister        BPES  . Other Paternal Uncle        BPES  . Other Cousin        BPES  . Other Cousin        BPES  . Heart attack Maternal Grandmother   . Heart disease Maternal Grandfather   . Heart attack Maternal Grandfather   . Lung cancer Maternal Grandfather   . Heart disease Paternal Grandfather heartr attack  . Heart attack Paternal Grandfather 81       MI  . Sudden death Paternal Grandfather     ROS: Review of Systems  Constitutional: Positive for malaise/fatigue.  HENT:       Hay Fever  Eyes:       Wear Glasses  Respiratory: Positive for shortness of breath (with activity).   Cardiovascular: Negative for chest pain and orthopnea.  Gastrointestinal: Negative for nausea and vomiting.  Musculoskeletal:       Negative for muscle weakness  Neurological: Negative for headaches.  Endo/Heme/Allergies:       Positive for polyphagia Negative for hypoglycemia  Psychiatric/Behavioral:       Stress    PHYSICAL EXAM: Blood pressure 139/82, pulse 86, temperature 98.1 F (36.7 C), temperature source Oral, height 5\' 8"  (1.727 m), weight (!) 363 lb (164.7 kg), last menstrual period 03/25/2017, SpO2 100 %. Body mass index is 55.19  kg/m. Physical Exam  Constitutional: She is oriented to person, place, and time. She appears well-developed and well-nourished.  HENT:  Head: Normocephalic and atraumatic.  Nose: Nose normal.  Eyes: EOM are normal. No scleral icterus.  Neck: Normal range of motion. Neck supple. No thyromegaly present.  Cardiovascular: Normal rate.  Murmur heard.  Systolic (early) murmur is present with a grade of 1/6. Pulmonary/Chest: Effort normal. No respiratory distress.  Abdominal: Soft. There is no tenderness.  + obesity  Musculoskeletal: Normal range of motion.  Range of Motion normal in all 4 extremities  Neurological: She is alert and oriented to person, place, and time. Coordination normal.  Skin: Skin is warm and dry.  Psychiatric: She has a normal mood and affect. Her behavior is normal.  Vitals reviewed.   RECENT LABS AND TESTS: BMET    Component Value Date/Time   NA 138 05/25/2017 0843   K 4.5 05/25/2017 0843   CL 103 05/25/2017 0843   CO2 26 05/25/2017 0843   GLUCOSE 89 05/25/2017 0843   BUN 10 05/25/2017 0843   CREATININE 0.66 05/25/2017 0843   CALCIUM 9.0 05/25/2017 0843   GFRNONAA >60 03/04/2017 1157   GFRAA >60 03/04/2017 1157   Lab Results  Component Value Date   HGBA1C 5.7 05/25/2017   No results found for: INSULIN CBC    Component Value Date/Time   WBC 5.9 05/25/2017 0843   RBC 4.68 05/25/2017 0843   HGB 12.8 05/25/2017 0843   HCT 40.3 05/25/2017 0843   PLT 243.0 05/25/2017 0843   MCV 86.2 05/25/2017 0843   MCH 27.0 03/04/2017 1157   MCHC 31.8 05/25/2017 0843   RDW 14.6 05/25/2017 0843   LYMPHSABS 1.7 05/25/2017 0843   MONOABS 0.6 05/25/2017 0843   EOSABS 0.2 05/25/2017 0843   BASOSABS 0.1 05/25/2017 0843   Iron/TIBC/Ferritin/ %Sat No results found for: IRON, TIBC, FERRITIN, IRONPCTSAT Lipid Panel     Component Value Date/Time   CHOL 144 05/25/2017 0843   TRIG 103.0 05/25/2017 0843  HDL 43.10 05/25/2017 0843   CHOLHDL 3 05/25/2017 0843   VLDL  20.6 05/25/2017 0843   LDLCALC 80 05/25/2017 0843   Hepatic Function Panel     Component Value Date/Time   PROT 6.9 05/25/2017 0843   ALBUMIN 4.2 05/25/2017 0843   AST 18 05/25/2017 0843   ALT 17 05/25/2017 0843   ALKPHOS 52 05/25/2017 0843   BILITOT 0.4 05/25/2017 0843      Component Value Date/Time   TSH 1.97 05/25/2017 0843   TSH 1.58 06/18/2016 0952   TSH 1.38 10/08/2015 1525   Results for HAZLEY, DEZEEUW (MRN [id number]) as of 11/02/2017 15:55  Ref. Range 09/25/2015 13:07  Vitamin D, 25-Hydroxy Latest Ref Range: 30 - 100 ng/mL 24 (L)   ECG  shows NSR with a rate of 71 BPM INDIRECT CALORIMETER done today shows a VO2 of 419 and a REE of 2916.  Her calculated basal metabolic rate is 4098 thus her basal metabolic rate is better than expected.    ASSESSMENT AND PLAN: Other fatigue - Plan: EKG 12-Lead, CBC With Differential, Folate, Lipid Panel With LDL/HDL Ratio, T3, T4, free, TSH, Anemia panel  Shortness of breath on exertion - Plan: CBC With Differential  Prediabetes - Plan: Comprehensive metabolic panel, Hemoglobin A1c, Insulin, random  Prehypertension  Vitamin D deficiency - Plan: VITAMIN D 25 Hydroxy (Vit-D Deficiency, Fractures)  B12 nutritional deficiency - Plan: Vitamin B12  Depression screening  At risk for heart disease  Class 3 severe obesity with serious comorbidity and body mass index (BMI) of 50.0 to 59.9 in adult, unspecified obesity type (HCC)  PLAN: Fatigue Leilyn was informed that her fatigue may be related to obesity, depression or many other causes. Labs will be ordered, and in the meanwhile Carollee has agreed to work on diet, exercise and weight loss to help with fatigue. Proper sleep hygiene was discussed including the need for 7-8 hours of quality sleep each night. A sleep study was not ordered based on symptoms and Epworth score.  Dyspnea on exertion Alizandra's shortness of breath appears to be obesity related and exercise induced. She has  agreed to work on weight loss and gradually increase exercise to treat her exercise induced shortness of breath. If Claramae follows our instructions and loses weight without improvement of her shortness of breath, we will plan to refer to pulmonology. We will monitor this condition regularly. Hend agrees to this plan.  Vitamin D Deficiency Laisha was informed that low vitamin D levels contributes to fatigue and are associated with obesity, breast, and colon cancer. She agrees to continue to take prescription Vit D @5 ,000 IU daily. We will check labs and she will follow up for routine testing of vitamin D, at least 2-3 times per year. She was informed of the risk of over-replacement of vitamin D and agrees to not increase her dose unless she discusses this with [place] first.  Vitamin B12 deficiency Darrah will work on increasing B12 rich foods in her diet. We will check labs and will follow. Lutie will be on a iron and B12 rich diet.  Pre-Diabetes Kaitlyn will work on weight loss, exercise, and decreasing simple carbohydrates in her diet to help decrease the risk of diabetes. We dicussed metformin including benefits and risks. She was informed that eating too many simple carbohydrates or too many calories at one sitting increases the likelihood of GI side effects. Rubina will start diet prescription and follow up with [place] as directed to monitor her progress.  Pre-Hypertension  We discussed sodium restriction, working on healthy weight loss, and a regular exercise program as the means to achieve improved blood pressure control. Shakesha start diet and weight loss and will follow up as directed. We will continue to monitor her blood pressure as well as her progress with the above lifestyle modifications.  Cardiovascular risk counseling Graceanna was given extended (15 minutes) coronary artery disease prevention counseling today. She is 32 y.o. female and has risk factors for heart disease including  obesity and pre-hypertension. We discussed intensive lifestyle modifications today with an emphasis on specific weight loss instructions and strategies. Pt was also informed of the importance of increasing exercise and decreasing saturated fats to help prevent heart disease.  Depression Screen Doraine had a strongly positive depression screening. Depression is commonly associated with obesity and often results in emotional eating behaviors. We will monitor this closely and work on CBT to help improve the non-hunger eating patterns. Referral to Psychology may be required if no improvement is seen as she continues in our clinic.  Obesity Dezaree is currently in the action stage of change and her goal is to continue with weight loss efforts. I recommend Tilla begin the structured treatment plan as follows:  She has agreed to follow the Category 4 plan Lodema has been instructed to eventually work up to a goal of 150 minutes of combined cardio and strengthening exercise per week for weight loss and overall health benefits. We discussed the following Behavioral Modification Strategies today: increasing lean protein intake, decreasing simple carbohydrates  and work on meal planning and easy cooking plans   She was informed of the importance of frequent follow up visits to maximize her success with intensive lifestyle modifications for her multiple health conditions. She was informed we would discuss her lab results at her next visit unless there is a critical issue that needs to be addressed sooner. Nehemiah agreed to keep her next visit at the agreed upon time to discuss these results.    OBESITY BEHAVIORAL INTERVENTION VISIT  Today's visit was # 1 out of 22.  Starting weight: 363 lbs Starting date: 11/02/17 Today's weight : 363 lbs Today's date: 11/02/2017 Total lbs lost to date: 0 (Patients must lose 7 lbs in the first 6 months to continue with counseling)   ASK: We discussed the diagnosis  of obesity with Johnsie Cancel today and Saima agreed to give [place] permission to discuss obesity behavioral modification therapy today.  ASSESS: Corynn has the diagnosis of obesity and her BMI today is 55.21 Taisa is in the action stage of change   ADVISE: Macaiah was educated on the multiple health risks of obesity as well as the benefit of weight loss to improve her health. She was advised of the need for long term treatment and the importance of lifestyle modifications.  AGREE: Multiple dietary modification options and treatment options were discussed and  Maisie agreed to the above obesity treatment plan.   I, Doreene Nest, am acting as transcriptionist for  Dennard Nip, MD   I have reviewed the above documentation for accuracy and completeness, and I agree with the above. -Dennard Nip, MD

## 2017-11-03 LAB — COMPREHENSIVE METABOLIC PANEL
ALK PHOS: 67 IU/L (ref 39–117)
ALT: 21 IU/L (ref 0–32)
AST: 16 IU/L (ref 0–40)
Albumin/Globulin Ratio: 1.6 (ref 1.2–2.2)
Albumin: 4 g/dL (ref 3.5–5.5)
BUN / CREAT RATIO: 26 — AB (ref 9–23)
BUN: 15 mg/dL (ref 6–20)
CHLORIDE: 103 mmol/L (ref 96–106)
CO2: 20 mmol/L (ref 20–29)
Calcium: 9.1 mg/dL (ref 8.7–10.2)
Creatinine, Ser: 0.58 mg/dL (ref 0.57–1.00)
GFR calc Af Amer: 143 mL/min/{1.73_m2} (ref >59)
GFR calc non Af Amer: 124 mL/min/{1.73_m2} (ref >59)
GLUCOSE: 93 mg/dL (ref 65–99)
Globulin, Total: 2.5 g/dL (ref 1.5–4.5)
Potassium: 4.5 mmol/L (ref 3.5–5.2)
Sodium: 137 mmol/L (ref 134–144)
Total Protein: 6.5 g/dL (ref 6.0–8.5)

## 2017-11-03 LAB — CBC WITH DIFFERENTIAL
BASOS ABS: 0 10*3/uL (ref 0.0–0.2)
Basos: 0 %
EOS (ABSOLUTE): 0.3 10*3/uL (ref 0.0–0.4)
EOS: 4 %
HEMOGLOBIN: 11.8 g/dL (ref 11.1–15.9)
IMMATURE GRANULOCYTES: 0 %
Immature Grans (Abs): 0 10*3/uL (ref 0.0–0.1)
LYMPHS: 29 %
Lymphocytes Absolute: 2.4 10*3/uL (ref 0.7–3.1)
MCH: 26.9 pg (ref 26.6–33.0)
MCHC: 32.2 g/dL (ref 31.5–35.7)
MCV: 84 fL (ref 79–97)
MONOCYTES: 6 %
MONOS ABS: 0.5 10*3/uL (ref 0.1–0.9)
NEUTROS PCT: 61 %
Neutrophils Absolute: 5.1 10*3/uL (ref 1.4–7.0)
RBC: 4.39 x10E6/uL (ref 3.77–5.28)
RDW: 15.1 % (ref 12.3–15.4)
WBC: 8.4 10*3/uL (ref 3.4–10.8)

## 2017-11-03 LAB — VITAMIN D 25 HYDROXY (VIT D DEFICIENCY, FRACTURES): Vit D, 25-Hydroxy: 36.5 ng/mL (ref 30.0–100.0)

## 2017-11-03 LAB — LIPID PANEL WITH LDL/HDL RATIO
Cholesterol, Total: 195 mg/dL (ref 100–199)
HDL: 50 mg/dL (ref >39)
LDL Calculated: 122 mg/dL — ABNORMAL HIGH (ref 0–99)
LDl/HDL Ratio: 2.4 ratio (ref 0.0–3.2)
Triglycerides: 116 mg/dL (ref 0–149)
VLDL CHOLESTEROL CAL: 23 mg/dL (ref 5–40)

## 2017-11-03 LAB — ANEMIA PANEL
FERRITIN: 28 ng/mL (ref 15–150)
FOLATE, RBC: 1550 ng/mL (ref >498)
Folate, Hemolysate: 568.7 ng/mL
HEMATOCRIT: 36.7 % (ref 34.0–46.6)
IRON SATURATION: 9 % — AB (ref 15–55)
Iron: 30 ug/dL (ref 27–159)
RETIC CT PCT: 1.6 % (ref 0.6–2.6)
TIBC: 337 ug/dL (ref 250–450)
UIBC: 307 ug/dL (ref 131–425)
VITAMIN B 12: 498 pg/mL (ref 232–1245)

## 2017-11-03 LAB — T4, FREE: FREE T4: 1.5 ng/dL (ref 0.82–1.77)

## 2017-11-03 LAB — FOLATE

## 2017-11-03 LAB — HEMOGLOBIN A1C
Est. average glucose Bld gHb Est-mCnc: 111 mg/dL
HEMOGLOBIN A1C: 5.5 % (ref 4.8–5.6)

## 2017-11-03 LAB — T3: T3 TOTAL: 118 ng/dL (ref 71–180)

## 2017-11-03 LAB — TSH: TSH: 2.7 u[IU]/mL (ref 0.450–4.500)

## 2017-11-03 LAB — INSULIN, RANDOM: INSULIN: 35.6 u[IU]/mL — AB (ref 2.6–24.9)

## 2017-11-15 ENCOUNTER — Ambulatory Visit: Payer: 59 | Admitting: Psychology

## 2017-11-16 ENCOUNTER — Ambulatory Visit (INDEPENDENT_AMBULATORY_CARE_PROVIDER_SITE_OTHER): Payer: 59 | Admitting: Family Medicine

## 2017-11-16 VITALS — BP 122/80 | HR 78 | Temp 98.1°F | Ht 68.0 in | Wt 349.0 lb

## 2017-11-16 DIAGNOSIS — E559 Vitamin D deficiency, unspecified: Secondary | ICD-10-CM

## 2017-11-16 DIAGNOSIS — Z9189 Other specified personal risk factors, not elsewhere classified: Secondary | ICD-10-CM

## 2017-11-16 DIAGNOSIS — R7303 Prediabetes: Secondary | ICD-10-CM

## 2017-11-16 DIAGNOSIS — Z6841 Body Mass Index (BMI) 40.0 and over, adult: Secondary | ICD-10-CM | POA: Diagnosis not present

## 2017-11-16 MED ORDER — VITAMIN D (ERGOCALCIFEROL) 1.25 MG (50000 UNIT) PO CAPS: 50000.0000 [IU] | ORAL_CAPSULE | ORAL | 0 refills | Status: DC

## 2017-11-17 NOTE — Progress Notes (Signed)
Office: [phone]  /  Fax: [phone]   HPI:   Chief Complaint: OBESITY [name] is here to discuss her progress with her obesity treatment plan. She is on the Category 4 plan and is following her eating plan approximately 100 % of the time. She states she is exercising 0 minutes 0 times per week. 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  Her weight is (!) 349 lb (158.3 kg) today and has had a weight loss of 14 pounds over a period of 2 weeks since her last visit. She has lost 14 lbs since starting treatment with [place].  Vitamin D Deficiency [name] has a diagnosis of vitamin D deficiency. She is on OTC Vit D 5,000 daily, not yet at goal. She notes fatigue and denies nausea, vomiting or muscle weakness.  Pre-Diabetes [name] has a diagnosis of pre-diabetes based on her elevated Hgb A1c and was informed this puts her at greater risk of developing diabetes. She is on metformin, A1c improved, she is doing well with diet and weight loss and continues to work on exercise to decrease risk of diabetes. She denies nausea or hypoglycemia.  At risk for diabetes [name] is at higher than average risk for developing diabetes due to her obesity and pre-diabetes. She currently denies polyuria or polydipsia.  ALLERGIES: No Known Allergies  MEDICATIONS: Current Outpatient Medications on File Prior to Visit  Medication Sig Dispense Refill  . Cholecalciferol 5000 units capsule Take 1 capsule (5,000 Units total) by mouth daily. With meal 30 capsule 11  . Cyanocobalamin 2500 MCG SUBL Place 2,500 mcg under the tongue daily.     [name] estradiol (CLIMARA - DOSED IN MG/24 HR) 0.1 mg/24hr patch Place 0.1 mg onto the skin. Apply and change patch twice weekly    . medroxyPROGESTERone (PROVERA) 5 MG tablet Take 5 mg by mouth. Take one tablet by mouth daily for 12 days    . metFORMIN (GLUCOPHAGE-XR) 500 MG 24 hr tablet Take 3 tablets (1,500 mg total) by mouth  daily. 270 tablet 1  . Multiple Vitamin (MULTIVITAMIN) capsule Take 1 capsule by mouth daily.     No current facility-administered medications on file prior to visit.     PAST MEDICAL HISTORY: Past Medical History:  Diagnosis Date  . Anemia   . Back pain   . BPES syndrome   . Enlarged thyroid   . Infertility, female   . Obesity   . Palpitations   . Palpitations   . PCOS (polycystic ovarian syndrome)   . PONV (postoperative nausea and vomiting)   . Prediabetes   . Premature ovarian failure   . Vitamin B 12 deficiency   . Vitamin D deficiency   . Wears eyeglasses     PAST SURGICAL HISTORY: Past Surgical History:  Procedure Laterality Date  . EYE SURGERY Bilateral   . NASAL SEPTOPLASTY W/ TURBINOPLASTY Bilateral 03/04/2017   Procedure: NASAL SEPTOPLASTY WITH BILATERAL INFERIOR TURBINATE REDUCTION;  Surgeon: Jerrell Belfast, MD;  Location: [facility];  Service: ENT;  Laterality: Bilateral;  . NASAL SEPTUM SURGERY  03/04/2017  . TONSILLECTOMY AND ADENOIDECTOMY Bilateral 03/04/2017   Procedure: TONSILLECTOMY;  Surgeon: Jerrell Belfast, MD;  Location: [facility];  Service: ENT;  Laterality: Bilateral;    SOCIAL HISTORY: Social History   Tobacco Use  . Smoking status: Never Smoker  . Smokeless tobacco: Never Used  Substance Use Topics  . Alcohol use: Yes    Alcohol/week: 0.0 oz  Comment: socially  . Drug use: No    FAMILY HISTORY: Family History  Problem Relation Age of Onset  . Hyperlipidemia Father   . Hypertension Father   . Heart disease Father   . Sudden death Father        Age 30  . Other Father        BPES  . Heart attack Father   . Other Other        BPES  . Other Other        BPES  . Hyperlipidemia Mother   . Hypertension Mother   . Diabetes Mother   . Anxiety disorder Mother   . Obesity Mother   . Other Brother        BPES  . Other Sister        BPES  . Other Paternal Uncle        BPES  . Other Cousin        BPES  . Other Cousin        BPES  .  Heart attack Maternal Grandmother   . Heart disease Maternal Grandfather   . Heart attack Maternal Grandfather   . Lung cancer Maternal Grandfather   . Heart disease Paternal Grandfather heartr attack  . Heart attack Paternal Grandfather 32       MI  . Sudden death Paternal Grandfather     ROS: Review of Systems  Constitutional: Positive for malaise/fatigue and weight loss.  Gastrointestinal: Negative for nausea and vomiting.  Genitourinary: Negative for frequency.  Musculoskeletal:       Negative muscle weakness  Endo/Heme/Allergies: Negative for polydipsia.       Negative hypoglycemia    PHYSICAL EXAM: Blood pressure 122/80, pulse 78, temperature 98.1 F (36.7 C), temperature source Oral, height 5\' 8"  (1.727 m), weight (!) 349 lb (158.3 kg), last menstrual period 11/09/2017, SpO2 99 %. Body mass index is 53.07 kg/m. Physical Exam  Constitutional: She is oriented to person, place, and time. She appears well-developed and well-nourished.  Cardiovascular: Normal rate.  Pulmonary/Chest: Effort normal.  Musculoskeletal: Normal range of motion.  Neurological: She is oriented to person, place, and time.  Skin: Skin is warm and dry.  Psychiatric: She has a normal mood and affect. Her behavior is normal.  Vitals reviewed.   RECENT LABS AND TESTS: BMET    Component Value Date/Time   NA 137 11/02/2017 0904   K 4.5 11/02/2017 0904   CL 103 11/02/2017 0904   CO2 20 11/02/2017 0904   GLUCOSE 93 11/02/2017 0904   GLUCOSE 89 05/25/2017 0843   BUN 15 11/02/2017 0904   CREATININE 0.58 11/02/2017 0904   CALCIUM 9.1 11/02/2017 0904   GFRNONAA 124 11/02/2017 0904   GFRAA 143 11/02/2017 0904   Lab Results  Component Value Date   HGBA1C 5.5 11/02/2017   HGBA1C 5.7 05/25/2017   HGBA1C 5.1 11/29/2016   HGBA1C 5.8 06/18/2016   HGBA1C 5.6 01/28/2015   Lab Results  Component Value Date   INSULIN 35.6 (H) 11/02/2017   CBC    Component Value Date/Time   WBC 8.4 11/02/2017 0904    WBC 5.9 05/25/2017 0843   RBC 4.39 11/02/2017 0904   RBC 4.68 05/25/2017 0843   HGB 11.8 11/02/2017 0904   HCT 36.7 11/02/2017 0904   PLT 243.0 05/25/2017 0843   MCV 84 11/02/2017 0904   MCH 26.9 11/02/2017 0904   MCH 27.0 03/04/2017 1157   MCHC 32.2 11/02/2017 0904   MCHC 31.8  05/25/2017 0843   RDW 15.1 11/02/2017 0904   LYMPHSABS 2.4 11/02/2017 0904   MONOABS 0.6 05/25/2017 0843   EOSABS 0.3 11/02/2017 0904   BASOSABS 0.0 11/02/2017 0904   Iron/TIBC/Ferritin/ %Sat    Component Value Date/Time   IRON 30 11/02/2017 0904   TIBC 337 11/02/2017 0904   FERRITIN 28 11/02/2017 0904   IRONPCTSAT 9 (LL) 11/02/2017 0904   Lipid Panel     Component Value Date/Time   CHOL 195 11/02/2017 0904   TRIG 116 11/02/2017 0904   HDL 50 11/02/2017 0904   CHOLHDL 3 05/25/2017 0843   VLDL 20.6 05/25/2017 0843   LDLCALC 122 (H) 11/02/2017 0904   Hepatic Function Panel     Component Value Date/Time   PROT 6.5 11/02/2017 0904   ALBUMIN 4.0 11/02/2017 0904   AST 16 11/02/2017 0904   ALT 21 11/02/2017 0904   ALKPHOS 67 11/02/2017 0904   BILITOT <0.2 11/02/2017 0904      Component Value Date/Time   TSH 2.700 11/02/2017 0904   TSH 1.97 05/25/2017 0843   TSH 1.58 06/18/2016 0952  Results for ERSEL, WADLEIGH (MRN [id number]) as of 11/17/2017 09:07  Ref. Range 11/02/2017 09:04  Vitamin D, 25-Hydroxy Latest Ref Range: 30.0 - 100.0 ng/mL 36.5    ASSESSMENT AND PLAN: Vitamin D deficiency  Prediabetes  At risk for diabetes mellitus  Class 3 severe obesity with serious comorbidity and body mass index (BMI) of 50.0 to 59.9 in adult, unspecified obesity type ([name])  PLAN:  Vitamin D Deficiency [name] was informed that low vitamin D levels contributes to fatigue and are associated with obesity, breast, and colon cancer. [name] agrees to continue taking OTC Vit D as is, and she agrees to start prescription Vit D @50 ,000 IU every week #4 with no refills. She will follow up for routine  testing of vitamin D, at least 2-3 times per year. She was informed of the risk of over-replacement of vitamin D and agrees to not increase her dose unless she discusses this with [place] first. Hargun agrees to follow up with our clinic in 2 to 3 weeks.  Pre-Diabetes [name] will continue to work on weight loss, diet, exercise, and decreasing simple carbohydrates in her diet to help decrease the risk of diabetes. We dicussed metformin including benefits and risks. She was informed that eating too many simple carbohydrates or too many calories at one sitting increases the likelihood of GI side effects. Karesa agrees to continue taking metformin as is and she agrees to follow up with our clinic in 2 to 3 weeks as directed to monitor her progress.  Diabetes risk counselling [name] was given extended (30 minutes) diabetes prevention counseling today. She is 32 y.o. female and has risk factors for diabetes including obesity and pre-diabetes. We discussed intensive lifestyle modifications today with an emphasis on weight loss as well as increasing exercise and decreasing simple carbohydrates in her diet.  Obesity [name] is currently in the action stage of change. As such, her goal is to continue with weight loss efforts She has agreed to follow the Category 4 plan [name] has been instructed to work up to a goal of 150 minutes of combined cardio and strengthening exercise per week for weight loss and overall health benefits. We discussed the following Behavioral Modification Strategies today: increasing lean protein intake, decreasing simple carbohydrates  and work on meal planning and easy cooking plans   [name] has agreed to follow up with our clinic in 2 to  3 weeks. She was informed of the importance of frequent follow up visits to maximize her success with intensive lifestyle modifications for her multiple health conditions.   OBESITY BEHAVIORAL INTERVENTION VISIT  Today's visit was # 2 out of  22.  Starting weight: 363 lbs Starting date: 11/02/17 Today's weight : 349 lbs Today's date: 11/16/2017 Total lbs lost to date: 14 (Patients must lose 7 lbs in the first 6 months to continue with counseling)   ASK: We discussed the diagnosis of obesity with [name] today and [name] agreed to give [place] permission to discuss obesity behavioral modification therapy today.  ASSESS: [name] has the diagnosis of obesity and her BMI today is 53.08 [name] is in the action stage of change   ADVISE: [name] was educated on the multiple health risks of obesity as well as the benefit of weight loss to improve her health. She was advised of the need for long term treatment and the importance of lifestyle modifications.  AGREE: Multiple dietary modification options and treatment options were discussed and  [name] agreed to the above obesity treatment plan.  I, Trixie Dredge, am acting as transcriptionist for Dennard Nip, MD  I have reviewed the above documentation for accuracy and completeness, and I agree with the above. -Dennard Nip, MD

## 2017-11-21 DIAGNOSIS — F4322 Adjustment disorder with anxiety: Secondary | ICD-10-CM | POA: Diagnosis not present

## 2017-11-23 ENCOUNTER — Ambulatory Visit: Payer: 59 | Admitting: Family Medicine

## 2017-11-23 ENCOUNTER — Encounter: Payer: Self-pay | Admitting: Family Medicine

## 2017-11-23 VITALS — BP 120/75 | HR 67 | Temp 98.4°F | Resp 20 | Ht 68.0 in | Wt 355.0 lb

## 2017-11-23 DIAGNOSIS — Q103 Other congenital malformations of eyelid: Secondary | ICD-10-CM | POA: Diagnosis not present

## 2017-11-23 DIAGNOSIS — E282 Polycystic ovarian syndrome: Secondary | ICD-10-CM | POA: Diagnosis not present

## 2017-11-23 DIAGNOSIS — N911 Secondary amenorrhea: Secondary | ICD-10-CM | POA: Diagnosis not present

## 2017-11-23 DIAGNOSIS — R7303 Prediabetes: Secondary | ICD-10-CM

## 2017-11-23 DIAGNOSIS — E2839 Other primary ovarian failure: Secondary | ICD-10-CM | POA: Diagnosis not present

## 2017-11-23 MED ORDER — METFORMIN HCL ER 500 MG PO TB24: 1500.0000 mg | ORAL_TABLET | Freq: Every day | ORAL | 1 refills | Status: DC

## 2017-11-23 NOTE — Patient Instructions (Addendum)
I have refilled your metformin.  Keep up the good work with diet and exercise.    Follow up every 3 months.   Congrats on the job and engagement!!!!  Please help [place] help you:  We are honored you have chosen [facility] for your Primary Care home. Below you will find basic instructions that you may need to access in the future. Please help [place] help you by reading the instructions, which cover many of the frequent questions we experience.   Prescription refills and request:  -In order to allow more efficient response time, please call your pharmacy for all refills. They will forward the request electronically to [place]. This allows for the quickest possible response. Request left on a nurse line can take longer to refill, since these are checked as time allows between office patients and other phone calls.  - refill request can take up to 3-5 working days to complete.  - If request is sent electronically and request is appropiate, it is usually completed in 1-2 business days.  - all patients will need to be seen routinely for all chronic medical conditions requiring prescription medications (see follow-up below). If you are overdue for follow up on your condition, you will be asked to make an appointment and we will call in enough medication to cover you until your appointment (up to 30 days).  - all controlled substances will require a face to face visit to request/refill.  - if you desire your prescriptions to go through a new pharmacy, and have an active script at original pharmacy, you will need to call your pharmacy and have scripts transferred to new pharmacy. This is completed between the pharmacy locations and not by your provider.    Results: If any images or labs were ordered, it can take up to 1 week to get results depending on the test ordered and the lab/facility running and resulting the test. - Normal or stable results, which do not need further discussion, may be released to your  mychart immediately with attached note to you. A call may not be generated for normal results. Please make certain to sign up for mychart. If you have questions on how to activate your mychart you can call the front office.  - If your results need further discussion, our office will attempt to contact you via phone, and if unable to reach you after 2 attempts, we will release your abnormal result to your mychart with instructions.  - All results will be automatically released in mychart after 1 week.  - Your provider will provide you with explanation and instruction on all relevant material in your results. Please keep in mind, results and labs may appear confusing or abnormal to the untrained eye, but it does not mean they are actually abnormal for you personally. If you have any questions about your results that are not covered, or you desire more detailed explanation than what was provided, you should make an appointment with your provider to do so.   Our office handles many outgoing and incoming calls daily. If we have not contacted you within 1 week about your results, please check your mychart to see if there is a message first and if not, then contact our office.  In helping with this matter, you help decrease call volume, and therefore allow [place] to be able to respond to patients needs more efficiently.   Acute office visits (sick visit):  An acute visit is intended for a new problem  and are scheduled in shorter time slots to allow schedule openings for patients with new problems. This is the appropriate visit to discuss a new problem. Problems will not be addressed by phone call or Echart message. Appointment is needed if requesting treatment. In order to provide you with excellent quality medical care with proper time for you to explain your problem, have an exam and receive treatment with instructions, these appointments should be limited to one new problem per visit. If you experience a new  problem, in which you desire to be addressed, please make an acute office visit, we save openings on the schedule to accommodate you. Please do not save your new problem for any other type of visit, let us take care of it properly and quickly for you.   Follow up visits:  Depending on your condition(s) your provider will need to see you routinely in order to provide you with quality care and prescribe medication(s). Most chronic conditions (Example: hypertension, Diabetes, depression/anxiety... etc), require visits a couple times a year. Your provider will instruct you on proper follow up for your personal medical conditions and history. Please make certain to make follow up appointments for your condition as instructed. Failing to do so could result in lapse in your medication treatment/refills. If you request a refill, and are overdue to be seen on a condition, we will always provide you with a 30 day script (once) to allow you time to schedule.    Medicare wellness (well visit): - we have a wonderful Nurse Maudie Mercury), that will meet with you and provide you will yearly medicare wellness visits. These visits should occur yearly (can not be scheduled less than 1 calendar year apart) and cover preventive health, immunizations, advance directives and screenings you are entitled to yearly through your medicare benefits. Do not miss out on your entitled benefits, this is when medicare will pay for these benefits to be ordered for you.  These are strongly encouraged by your provider and is the appropriate type of visit to make certain you are up to date with all preventive health benefits. If you have not had your medicare wellness exam in the last 12 months, please make certain to schedule one by calling the office and schedule your medicare wellness with Maudie Mercury as soon as possible.   Yearly physical (well visit):  - Adults are recommended to be seen yearly for physicals. Check with your insurance and date of your  last physical, most insurances require one calendar year between physicals. Physicals include all preventive health topics, screenings, medical exam and labs that are appropriate for gender/age and history. You may have fasting labs needed at this visit. This is a well visit (not a sick visit), new problems should not be covered during this visit (see acute visit).  - Pediatric patients are seen more frequently when they are younger. Your provider will advise you on well child visit timing that is appropriate for your their age. - This is not a medicare wellness visit. Medicare wellness exams do not have an exam portion to the visit. Some medicare companies allow for a physical, some do not allow a yearly physical. If your medicare allows a yearly physical you can schedule the medicare wellness with our nurse Maudie Mercury and have your physical with your provider after, on the same day. Please check with insurance for your full benefits.   Late Policy/No Shows:  - all new patients should arrive 15-30 minutes earlier than appointment to allow [place] time  to  obtain all personal demographics,  insurance information and for you to complete office paperwork. - All established patients should arrive 10-15 minutes earlier than appointment time to update all information and be checked in .  - In our best efforts to run on time, if you are late for your appointment you will be asked to either reschedule or if able, we will work you back into the schedule. There will be a wait time to work you back in the schedule,  depending on availability.  - If you are unable to make it to your appointment as scheduled, please call 24 hours ahead of time to allow [place] to fill the time slot with someone else who needs to be seen. If you do not cancel your appointment ahead of time, you may be charged a no show fee.

## 2017-11-23 NOTE — Progress Notes (Signed)
[name] , 09-09-1985, 32 y.o., female MRN: [id number] Patient Care Team    Relationship Specialty Notifications Start End  Vanessa Hillock, DO PCP - General Family Medicine  04/25/15   Vanessa Salina, MD Consulting Physician Obstetrics and Gynecology  06/18/16   Vanessa Hampshire, MD Consulting Physician Cardiology  06/18/16     Chief Complaint  Patient presents with  . Prediabetes     Subjective:   BMI 50.0-59.9, adult (HCC)/prediabetes/morbid obesity/PCOS She is doing well. Reports compliance  metformin 1500 mg QD. She has been started in estrogen patches through gyn.  She has been dieting and exercising. She is following with Dr. Trixie Gaines for her obesity.      Depression screen [facility] 2/9 11/02/2017 05/25/2017 06/18/2016 06/28/2014  Decreased Interest 3 0 0 0  Down, Depressed, Hopeless 2 0 0 0  PHQ - 2 Score 5 0 0 0  Altered sleeping 2 - - -  Tired, decreased energy 3 - - -  Change in appetite 3 - - -  Feeling bad or failure about yourself  3 - - -  Trouble concentrating 1 - - -  Moving slowly or fidgety/restless 1 - - -  Suicidal thoughts 0 - - -  PHQ-9 Score 18 - - -  Difficult doing work/chores Somewhat difficult - - -    No Known Allergies Social History   Tobacco Use  . Smoking status: Never Smoker  . Smokeless tobacco: Never Used  Substance Use Topics  . Alcohol use: Yes    Alcohol/week: 0.0 oz    Comment: socially   Past Medical History:  Diagnosis Date  . Anemia   . Back pain   . BPES syndrome   . Enlarged thyroid   . Infertility, female   . Obesity   . Palpitations   . Palpitations   . PCOS (polycystic ovarian syndrome)   . PONV (postoperative nausea and vomiting)   . Prediabetes   . Premature ovarian failure   . Vitamin B 12 deficiency   . Vitamin D deficiency   . Wears eyeglasses    Past Surgical History:  Procedure Laterality Date  . EYE SURGERY Bilateral   . NASAL SEPTOPLASTY W/ TURBINOPLASTY Bilateral 03/04/2017   Procedure:  NASAL SEPTOPLASTY WITH BILATERAL INFERIOR TURBINATE REDUCTION;  Surgeon: Jerrell Belfast, MD;  Location: [place];  Service: ENT;  Laterality: Bilateral;  . NASAL SEPTUM SURGERY  03/04/2017  . TONSILLECTOMY AND ADENOIDECTOMY Bilateral 03/04/2017   Procedure: TONSILLECTOMY;  Surgeon: Jerrell Belfast, MD;  Location: [facility] OR;  Service: ENT;  Laterality: Bilateral;   Family History  Problem Relation Age of Onset  . Hyperlipidemia Father   . Hypertension Father   . Heart disease Father   . Sudden death Father        Age 50  . Other Father        BPES  . Heart attack Father   . Other Other        BPES  . Other Other        BPES  . Hyperlipidemia Mother   . Hypertension Mother   . Diabetes Mother   . Anxiety disorder Mother   . Obesity Mother   . Other Brother        BPES  . Other Sister        BPES  . Other Paternal Uncle        BPES  . Other Cousin        BPES  .  Other Cousin        BPES  . Heart attack Maternal Grandmother   . Heart disease Maternal Grandfather   . Heart attack Maternal Grandfather   . Lung cancer Maternal Grandfather   . Heart disease Paternal Grandfather heartr attack  . Heart attack Paternal Grandfather 68       MI  . Sudden death Paternal Grandfather    Allergies as of 11/23/2017   No Known Allergies     Medication List        Accurate as of 11/23/17  9:36 AM. Always use your most recent med list.          Cholecalciferol 5000 units capsule Take 1 capsule (5,000 Units total) by mouth daily. With meal   Cyanocobalamin 2500 MCG Subl Place 2,500 mcg under the tongue daily.   estradiol 0.1 mg/24hr patch Commonly known as:  CLIMARA - Dosed in mg/24 hr Place 0.1 mg onto the skin. Apply and change patch twice weekly   medroxyPROGESTERone 5 MG tablet Commonly known as:  PROVERA Take 5 mg by mouth. Take one tablet by mouth daily for 12 days   metFORMIN 500 MG 24 hr tablet Commonly known as:  GLUCOPHAGE-XR Take 3 tablets (1,500 mg total) by mouth  daily.   multivitamin capsule Take 1 capsule by mouth daily.   Vitamin D (Ergocalciferol) 50000 units Caps capsule Commonly known as:  DRISDOL Take 1 capsule (50,000 Units total) by mouth every 7 (seven) days.       All past medical history, surgical history, allergies, family history, immunizations andmedications were updated in the EMR today and reviewed under the history and medication portions of their EMR.     ROS: Negative, with the exception of above mentioned in HPI   Objective:  BP 120/75 (BP Location: Left Arm, Patient Position: Sitting, Cuff Size: Large)   Pulse 67   Temp 98.4 F (36.9 C)   Resp 20   Ht 5\' 8"  (1.727 m)   Wt (!) 355 lb (161 kg)   LMP 11/09/2017 (Exact Date)   SpO2 100%   BMI 53.98 kg/m  Body mass index is 53.98 kg/m. Gen: Afebrile. No acute distress. Nontoxic in appearance, well developed, well nourished. Pleasant, obese female.   HENT: AT. [place].  MMM.  Eyes:Pupils Equal Round Reactive to light, Extraocular movements intact,  Conjunctiva without redness, discharge or icterus. Neck/lymp/endocrine: Supple, no lymphadenopathy, mild thyromegaly CV: RRR no murmur, no edema, +2/4 P posterior tibialis pulses Chest: CTAB, no wheeze or crackles Abd: Soft. obese. NTND. BS present.   Neuro:  Normal gait. PERLA. EOMi. Alert. Oriented x3  Psych: Normal affect, dress and demeanor. Normal speech. Normal thought content and judgment.    No exam data present No results found. No results found for this or any previous visit (from the past 24 hour(s)).  Assessment/Plan: [name] is a 32 y.o. female present for OV for  Prediabetes Morbid obesity ([place]) PCBMI 50.0-59.9, adult ([place]) PCOS (polycystic ovarian syndrome) - 5.8--> 5.1--> 5.7--> 5.5 (completed 2 weeks ago) - metformin use mostly for PCOS. - Continued/refilled metformin 1500 mg total QD  - She is seeing weight management clinic. She has made dietary changes. She has lost some weight.  -  continue f/u with GYN, tolerating estrogen patches and feels improved. Patient's last menstrual period was 11/09/2017 (exact date).' - Routine exercise > 150 min a week encouraged  - reviewed all labs collected 11/02/2017. - F/U 6 mos.    Reviewed expectations  re: course of current medical issues.  Discussed self-management of symptoms.  Outlined signs and symptoms indicating need for more acute intervention.  Patient verbalized understanding and all questions were answered.  Patient received an After-Visit Summary.  Note is dictated utilizing voice recognition software. Although note has been proof read prior to signing, occasional typographical errors still can be missed. If any questions arise, please do not hesitate to call for verification.   electronically signed by:  Howard Pouch, DO  [place]

## 2017-12-07 ENCOUNTER — Ambulatory Visit (INDEPENDENT_AMBULATORY_CARE_PROVIDER_SITE_OTHER): Payer: 59 | Admitting: Family Medicine

## 2017-12-07 VITALS — BP 149/82 | HR 80 | Temp 98.4°F | Ht 68.0 in | Wt 345.0 lb

## 2017-12-07 DIAGNOSIS — Z6841 Body Mass Index (BMI) 40.0 and over, adult: Secondary | ICD-10-CM | POA: Diagnosis not present

## 2017-12-07 DIAGNOSIS — F3289 Other specified depressive episodes: Secondary | ICD-10-CM

## 2017-12-07 DIAGNOSIS — Z9189 Other specified personal risk factors, not elsewhere classified: Secondary | ICD-10-CM

## 2017-12-07 DIAGNOSIS — E559 Vitamin D deficiency, unspecified: Secondary | ICD-10-CM | POA: Diagnosis not present

## 2017-12-07 DIAGNOSIS — R030 Elevated blood-pressure reading, without diagnosis of hypertension: Secondary | ICD-10-CM | POA: Diagnosis not present

## 2017-12-07 MED ORDER — BUPROPION HCL ER (SR) 150 MG PO TB12: 150.0000 mg | ORAL_TABLET | Freq: Every day | ORAL | 0 refills | Status: DC

## 2017-12-07 MED ORDER — VITAMIN D (ERGOCALCIFEROL) 1.25 MG (50000 UNIT) PO CAPS: 50000.0000 [IU] | ORAL_CAPSULE | ORAL | 0 refills | Status: DC

## 2017-12-08 NOTE — Progress Notes (Signed)
Office: [phone]  /  Fax: [phone]   HPI:   Chief Complaint: OBESITY [name] is here to discuss her progress with her obesity treatment plan. She is on the Category 4 plan and is following her eating plan approximately 90 % of the time. She states she is exercising 0 minutes 0 times per week. [name] continues to do well with weight loss but has had increased emotional eating, infertility diagnosis. Hunger mostly controlled.  Her weight is (!) 345 lb (156.5 kg) today and has had a weight loss of 4 pounds over a period of 3 weeks since her last visit. She has lost 18 lbs since starting treatment with [place].  Vitamin D Deficiency [name] has a diagnosis of vitamin D deficiency. She is stable on prescription Vit D, not yet at goal. She denies nausea, vomiting or muscle weakness.  Elevated Blood Pressure [name] has a history of borderline elevated blood pressure at times. She denies chest pain, working on diet and exercise.  At risk for cardiovascular disease [name] is at a higher than average risk for cardiovascular disease due to obesity and elevated blood pressure. She currently denies any chest pain.  Depression with emotional eating behaviors [name] recently diagnosed with infertility and is understandably upset, tearful in office and feeling overwhelmed. [name] struggles with emotional eating and using food for comfort to the extent that it is negatively impacting her health. She often snacks when she is not hungry. [name] sometimes feels she is out of control and then feels guilty that she made poor food choices. She has been working on behavior modification techniques to help reduce her emotional eating and has been somewhat successful. She shows no sign of suicidal or homicidal ideations.  Depression screen [facility] 2/9 11/02/2017 05/25/2017 06/18/2016 06/28/2014  Decreased Interest 3 0 0 0  Down, Depressed, Hopeless 2 0 0 0  PHQ - 2 Score 5 0 0 0  Altered sleeping 2 - - -    Tired, decreased energy 3 - - -  Change in appetite 3 - - -  Feeling bad or failure about yourself  3 - - -  Trouble concentrating 1 - - -  Moving slowly or fidgety/restless 1 - - -  Suicidal thoughts 0 - - -  PHQ-9 Score 18 - - -  Difficult doing work/chores Somewhat difficult - - -   ALLERGIES: No Known Allergies  MEDICATIONS: Current Outpatient Medications on File Prior to Visit  Medication Sig Dispense Refill  . Cholecalciferol 5000 units capsule Take 1 capsule (5,000 Units total) by mouth daily. With meal 30 capsule 11  . Cyanocobalamin 2500 MCG SUBL Place 2,500 mcg under the tongue daily.     [name] estradiol (CLIMARA - DOSED IN MG/24 HR) 0.1 mg/24hr patch Place 0.1 mg onto the skin. Apply and change patch twice weekly    . metFORMIN (GLUCOPHAGE-XR) 500 MG 24 hr tablet Take 3 tablets (1,500 mg total) by mouth daily. 270 tablet 1  . Multiple Vitamin (MULTIVITAMIN) capsule Take 1 capsule by mouth daily.     No current facility-administered medications on file prior to visit.     PAST MEDICAL HISTORY: Past Medical History:  Diagnosis Date  . Anemia   . Back pain   . BPES syndrome   . Enlarged thyroid   . Infertility, female   . Obesity   . Palpitations   . Palpitations   . PCOS (polycystic ovarian syndrome)   . PONV (postoperative nausea and vomiting)   . Prediabetes   .  Premature ovarian failure   . Vitamin B 12 deficiency   . Vitamin D deficiency   . Wears eyeglasses     PAST SURGICAL HISTORY: Past Surgical History:  Procedure Laterality Date  . EYE SURGERY Bilateral   . NASAL SEPTOPLASTY W/ TURBINOPLASTY Bilateral 03/04/2017   Procedure: NASAL SEPTOPLASTY WITH BILATERAL INFERIOR TURBINATE REDUCTION;  Surgeon: Jerrell Belfast, MD;  Location: [place];  Service: ENT;  Laterality: Bilateral;  . NASAL SEPTUM SURGERY  03/04/2017  . TONSILLECTOMY AND ADENOIDECTOMY Bilateral 03/04/2017   Procedure: TONSILLECTOMY;  Surgeon: Jerrell Belfast, MD;  Location: [place];  Service:  ENT;  Laterality: Bilateral;    SOCIAL HISTORY: Social History   Tobacco Use  . Smoking status: Never Smoker  . Smokeless tobacco: Never Used  Substance Use Topics  . Alcohol use: Yes    Alcohol/week: 0.0 oz    Comment: socially  . Drug use: No    FAMILY HISTORY: Family History  Problem Relation Age of Onset  . Hyperlipidemia Father   . Hypertension Father   . Heart disease Father   . Sudden death Father        Age 73  . Other Father        BPES  . Heart attack Father   . Other Other        BPES  . Other Other        BPES  . Hyperlipidemia Mother   . Hypertension Mother   . Diabetes Mother   . Anxiety disorder Mother   . Obesity Mother   . Other Brother        BPES  . Other Sister        BPES  . Other Paternal Uncle        BPES  . Other Cousin        BPES  . Other Cousin        BPES  . Heart attack Maternal Grandmother   . Heart disease Maternal Grandfather   . Heart attack Maternal Grandfather   . Lung cancer Maternal Grandfather   . Heart disease Paternal Grandfather heartr attack  . Heart attack Paternal Grandfather 31       MI  . Sudden death Paternal Grandfather     ROS: Review of Systems  Constitutional: Positive for weight loss.  Cardiovascular: Negative for chest pain.  Gastrointestinal: Negative for nausea and vomiting.  Musculoskeletal:       Negative muscle weakness  Psychiatric/Behavioral: Positive for depression. Negative for suicidal ideas.    PHYSICAL EXAM: Blood pressure (!) 149/82, pulse 80, temperature 98.4 F (36.9 C), temperature source Oral, height 5\' 8"  (1.727 m), weight (!) 345 lb (156.5 kg), last menstrual period 12/07/2017, SpO2 100 %. Body mass index is 52.46 kg/m. Physical Exam  Constitutional: She is oriented to person, place, and time. She appears well-developed and well-nourished.  Cardiovascular: Normal rate.  Pulmonary/Chest: Effort normal.  Musculoskeletal: Normal range of motion.  Neurological: She is  oriented to person, place, and time.  Skin: Skin is warm and dry.  Psychiatric: She has a normal mood and affect. Her behavior is normal.  Vitals reviewed.   RECENT LABS AND TESTS: BMET    Component Value Date/Time   NA 137 11/02/2017 0904   K 4.5 11/02/2017 0904   CL 103 11/02/2017 0904   CO2 20 11/02/2017 0904   GLUCOSE 93 11/02/2017 0904   GLUCOSE 89 05/25/2017 0843   BUN 15 11/02/2017 0904   CREATININE 0.58 11/02/2017 0904  CALCIUM 9.1 11/02/2017 0904   GFRNONAA 124 11/02/2017 0904   GFRAA 143 11/02/2017 0904   Lab Results  Component Value Date   HGBA1C 5.5 11/02/2017   HGBA1C 5.7 05/25/2017   HGBA1C 5.1 11/29/2016   HGBA1C 5.8 06/18/2016   HGBA1C 5.6 01/28/2015   Lab Results  Component Value Date   INSULIN 35.6 (H) 11/02/2017   CBC    Component Value Date/Time   WBC 8.4 11/02/2017 0904   WBC 5.9 05/25/2017 0843   RBC 4.39 11/02/2017 0904   RBC 4.68 05/25/2017 0843   HGB 11.8 11/02/2017 0904   HCT 36.7 11/02/2017 0904   PLT 243.0 05/25/2017 0843   MCV 84 11/02/2017 0904   MCH 26.9 11/02/2017 0904   MCH 27.0 03/04/2017 1157   MCHC 32.2 11/02/2017 0904   MCHC 31.8 05/25/2017 0843   RDW 15.1 11/02/2017 0904   LYMPHSABS 2.4 11/02/2017 0904   MONOABS 0.6 05/25/2017 0843   EOSABS 0.3 11/02/2017 0904   BASOSABS 0.0 11/02/2017 0904   Iron/TIBC/Ferritin/ %Sat    Component Value Date/Time   IRON 30 11/02/2017 0904   TIBC 337 11/02/2017 0904   FERRITIN 28 11/02/2017 0904   IRONPCTSAT 9 (LL) 11/02/2017 0904   Lipid Panel     Component Value Date/Time   CHOL 195 11/02/2017 0904   TRIG 116 11/02/2017 0904   HDL 50 11/02/2017 0904   CHOLHDL 3 05/25/2017 0843   VLDL 20.6 05/25/2017 0843   LDLCALC 122 (H) 11/02/2017 0904   Hepatic Function Panel     Component Value Date/Time   PROT 6.5 11/02/2017 0904   ALBUMIN 4.0 11/02/2017 0904   AST 16 11/02/2017 0904   ALT 21 11/02/2017 0904   ALKPHOS 67 11/02/2017 0904   BILITOT <0.2 11/02/2017 0904        Component Value Date/Time   TSH 2.700 11/02/2017 0904   TSH 1.97 05/25/2017 0843   TSH 1.58 06/18/2016 0952  Results for INETTA, DICKE (MRN [id number]) as of 12/08/2017 08:43  Ref. Range 11/02/2017 09:04  Vitamin D, 25-Hydroxy Latest Ref Range: 30.0 - 100.0 ng/mL 36.5    ASSESSMENT AND PLAN: Elevated blood pressure reading  At risk for heart disease  Vitamin D deficiency - Plan: Vitamin D, Ergocalciferol, (DRISDOL) 50000 units CAPS capsule  Other depression - with emotional eating - Plan: buPROPion (WELLBUTRIN SR) 150 MG 12 hr tablet  Class 3 severe obesity with serious comorbidity and body mass index (BMI) of 50.0 to 59.9 in adult, unspecified obesity type (HCC)  PLAN:  Vitamin D Deficiency [name] was informed that low vitamin D levels contributes to fatigue and are associated with obesity, breast, and colon cancer. [name] agrees to continue taking prescription Vit D @50 ,000 IU every week #4 and we will refill for 1 month. She will follow up for routine testing of vitamin D, at least 2-3 times per year. She was informed of the risk of over-replacement of vitamin D and agrees to not increase her dose unless she discusses this with [place] first. [name] agrees to follow up with our clinic in 2 to 3 weeks.  Elevated Blood Pressure [name] will continue diet and exercise and recheck blood pressure in 2 to 3 weeks, may need to start medications if no improvement. [name] agrees to follow up with our clinic in 2 to 3 weeks.  Cardiovascular risk counselling [name] was given extended (15 minutes) coronary artery disease prevention counseling today. She is 32 y.o. female and has risk factors for heart disease  including obesity and elevated blood pressure. We discussed intensive lifestyle modifications today with an emphasis on specific weight loss instructions and strategies. Pt was also informed of the importance of increasing exercise and decreasing saturated fats to help prevent heart  disease.  Depression with Emotional Eating Behaviors We discussed behavior modification techniques today to help [name] deal with her emotional eating and depression. [name] agrees to start Wellbutrin SR 150 mg q AM #30 with no refills. We will monitor blood pressure closely and [name] agrees to follow up with our clinic in 2 to 3 weeks.  Obesity [name] is currently in the action stage of change. As such, her goal is to continue with weight loss efforts She has agreed to follow the Category 4 plan [name] has been instructed to work up to a goal of 150 minutes of combined cardio and strengthening exercise per week for weight loss and overall health benefits. We discussed the following Behavioral Modification Strategies today: increasing lean protein intake, work on meal planning and easy cooking plans and emotional eating strategies   [name] has agreed to follow up with our clinic in 2 to 3 weeks. She was informed of the importance of frequent follow up visits to maximize her success with intensive lifestyle modifications for her multiple health conditions.   OBESITY BEHAVIORAL INTERVENTION VISIT  Today's visit was # 3 out of 22.  Starting weight: 363 lbs Starting date: 11/02/17 Today's weight : 345 lbs  Today's date: 12/07/2017 Total lbs lost to date: 70 (Patients must lose 7 lbs in the first 6 months to continue with counseling)   ASK: We discussed the diagnosis of obesity with [name] today and [name] agreed to give [place] permission to discuss obesity behavioral modification therapy today.  ASSESS: [name] has the diagnosis of obesity and her BMI today is 52.47 [name] is in the action stage of change   ADVISE: [name] was educated on the multiple health risks of obesity as well as the benefit of weight loss to improve her health. She was advised of the need for long term treatment and the importance of lifestyle modifications.  AGREE: Multiple dietary modification  options and treatment options were discussed and  Darolyn agreed to the above obesity treatment plan.  I, Trixie Dredge, am acting as transcriptionist for Dennard Nip, MD  I have reviewed the above documentation for accuracy and completeness, and I agree with the above. -Dennard Nip, MD

## 2017-12-09 ENCOUNTER — Encounter (INDEPENDENT_AMBULATORY_CARE_PROVIDER_SITE_OTHER): Payer: Self-pay | Admitting: Family Medicine

## 2017-12-09 DIAGNOSIS — F4322 Adjustment disorder with anxiety: Secondary | ICD-10-CM | POA: Diagnosis not present

## 2017-12-13 ENCOUNTER — Ambulatory Visit (INDEPENDENT_AMBULATORY_CARE_PROVIDER_SITE_OTHER): Payer: 59 | Admitting: Psychology

## 2017-12-13 DIAGNOSIS — F411 Generalized anxiety disorder: Secondary | ICD-10-CM

## 2017-12-23 DIAGNOSIS — F4322 Adjustment disorder with anxiety: Secondary | ICD-10-CM | POA: Diagnosis not present

## 2018-01-04 ENCOUNTER — Ambulatory Visit (INDEPENDENT_AMBULATORY_CARE_PROVIDER_SITE_OTHER): Payer: 59 | Admitting: Family Medicine

## 2018-01-04 VITALS — BP 126/84 | HR 73 | Temp 98.1°F | Ht 68.0 in | Wt 339.0 lb

## 2018-01-04 DIAGNOSIS — E559 Vitamin D deficiency, unspecified: Secondary | ICD-10-CM | POA: Diagnosis not present

## 2018-01-04 DIAGNOSIS — Z6841 Body Mass Index (BMI) 40.0 and over, adult: Secondary | ICD-10-CM

## 2018-01-04 DIAGNOSIS — F3289 Other specified depressive episodes: Secondary | ICD-10-CM | POA: Diagnosis not present

## 2018-01-04 MED ORDER — VITAMIN D (ERGOCALCIFEROL) 1.25 MG (50000 UNIT) PO CAPS: 50000.0000 [IU] | ORAL_CAPSULE | ORAL | 0 refills | Status: DC

## 2018-01-04 MED ORDER — BUPROPION HCL ER (SR) 150 MG PO TB12: 150.0000 mg | ORAL_TABLET | Freq: Every day | ORAL | 0 refills | Status: DC

## 2018-01-04 NOTE — Progress Notes (Signed)
Office: [phone]  /  Fax: [phone]   HPI:   Chief Complaint: OBESITY [name] is here to discuss her progress with her obesity treatment plan. She is on the Category 4 plan and is following her eating plan approximately 75 % of the time. She states she is exercising 0 minutes 0 times per week. [name] is doing well with weight loss on Category 4 plan. She has started a new job 1 week on and 1 week off and has had to adjust her meal planning but is doing well.  Her weight is (!) 339 lb (153.8 kg) today and has had a weight loss of 6 pounds over a period of 4 weeks since her last visit. She has lost 24 lbs since starting treatment with [place].  Vitamin D Deficiency [name] has a diagnosis of vitamin D deficiency. She is stable on prescription Vit D. She notes fatigue is improving and denies nausea, vomiting or muscle weakness.  Depression with emotional eating behaviors [name] notes decrease in emotional eating with Wellbutrin. Her mood is good, no worsening anxiety, and blood pressure is stable. [name] struggles with emotional eating and using food for comfort to the extent that it is negatively impacting her health. She often snacks when she is not hungry. [name] sometimes feels she is out of control and then feels guilty that she made poor food choices. She has been working on behavior modification techniques to help reduce her emotional eating and has been somewhat successful. She shows no sign of suicidal or homicidal ideations.  Depression screen [facility] 2/9 11/02/2017 05/25/2017 06/18/2016 06/28/2014  Decreased Interest 3 0 0 0  Down, Depressed, Hopeless 2 0 0 0  PHQ - 2 Score 5 0 0 0  Altered sleeping 2 - - -  Tired, decreased energy 3 - - -  Change in appetite 3 - - -  Feeling bad or failure about yourself  3 - - -  Trouble concentrating 1 - - -  Moving slowly or fidgety/restless 1 - - -  Suicidal thoughts 0 - - -  PHQ-9 Score 18 - - -  Difficult doing work/chores Somewhat  difficult - - -    ALLERGIES: No Known Allergies  MEDICATIONS: Current Outpatient Medications on File Prior to Visit  Medication Sig Dispense Refill  . Cholecalciferol 5000 units capsule Take 1 capsule (5,000 Units total) by mouth daily. With meal 30 capsule 11  . Cyanocobalamin 2500 MCG SUBL Place 2,500 mcg under the tongue daily.     [name] estradiol (CLIMARA - DOSED IN MG/24 HR) 0.1 mg/24hr patch Place 0.1 mg onto the skin. Apply and change patch twice weekly    . metFORMIN (GLUCOPHAGE-XR) 500 MG 24 hr tablet Take 3 tablets (1,500 mg total) by mouth daily. 270 tablet 1  . Multiple Vitamin (MULTIVITAMIN) capsule Take 1 capsule by mouth daily.     No current facility-administered medications on file prior to visit.     PAST MEDICAL HISTORY: Past Medical History:  Diagnosis Date  . Anemia   . Back pain   . BPES syndrome   . Enlarged thyroid   . Infertility, female   . Obesity   . Palpitations   . Palpitations   . PCOS (polycystic ovarian syndrome)   . PONV (postoperative nausea and vomiting)   . Prediabetes   . Premature ovarian failure   . Vitamin B 12 deficiency   . Vitamin D deficiency   . Wears eyeglasses     PAST SURGICAL HISTORY:  Past Surgical History:  Procedure Laterality Date  . EYE SURGERY Bilateral   . NASAL SEPTOPLASTY W/ TURBINOPLASTY Bilateral 03/04/2017   Procedure: NASAL SEPTOPLASTY WITH BILATERAL INFERIOR TURBINATE REDUCTION;  Surgeon: Jerrell Belfast, MD;  Location: [place];  Service: ENT;  Laterality: Bilateral;  . NASAL SEPTUM SURGERY  03/04/2017  . TONSILLECTOMY AND ADENOIDECTOMY Bilateral 03/04/2017   Procedure: TONSILLECTOMY;  Surgeon: Jerrell Belfast, MD;  Location: [place];  Service: ENT;  Laterality: Bilateral;    SOCIAL HISTORY: Social History   Tobacco Use  . Smoking status: Never Smoker  . Smokeless tobacco: Never Used  Substance Use Topics  . Alcohol use: Yes    Alcohol/week: 0.0 oz    Comment: socially  . Drug use: No    FAMILY  HISTORY: Family History  Problem Relation Age of Onset  . Hyperlipidemia Father   . Hypertension Father   . Heart disease Father   . Sudden death Father        Age 36  . Other Father        BPES  . Heart attack Father   . Other Other        BPES  . Other Other        BPES  . Hyperlipidemia Mother   . Hypertension Mother   . Diabetes Mother   . Anxiety disorder Mother   . Obesity Mother   . Other Brother        BPES  . Other Sister        BPES  . Other Paternal Uncle        BPES  . Other Cousin        BPES  . Other Cousin        BPES  . Heart attack Maternal Grandmother   . Heart disease Maternal Grandfather   . Heart attack Maternal Grandfather   . Lung cancer Maternal Grandfather   . Heart disease Paternal Grandfather heartr attack  . Heart attack Paternal Grandfather 23       MI  . Sudden death Paternal Grandfather     ROS: Review of Systems  Constitutional: Positive for malaise/fatigue and weight loss.  Gastrointestinal: Negative for nausea and vomiting.  Musculoskeletal:       Negative muscle weakness  Psychiatric/Behavioral: Positive for depression. Negative for suicidal ideas.    PHYSICAL EXAM: Blood pressure 126/84, pulse 73, temperature 98.1 F (36.7 C), temperature source Oral, height 5\' 8"  (1.727 m), weight (!) 339 lb (153.8 kg), last menstrual period 12/07/2017, SpO2 100 %. Body mass index is 51.54 kg/m. Physical Exam  Constitutional: She is oriented to person, place, and time. She appears well-developed and well-nourished.  Cardiovascular: Normal rate.  Pulmonary/Chest: Effort normal.  Musculoskeletal: Normal range of motion.  Neurological: She is oriented to person, place, and time.  Skin: Skin is warm and dry.  Psychiatric: She has a normal mood and affect. Her behavior is normal.  Vitals reviewed.   RECENT LABS AND TESTS: BMET    Component Value Date/Time   NA 137 11/02/2017 0904   K 4.5 11/02/2017 0904   CL 103 11/02/2017 0904    CO2 20 11/02/2017 0904   GLUCOSE 93 11/02/2017 0904   GLUCOSE 89 05/25/2017 0843   BUN 15 11/02/2017 0904   CREATININE 0.58 11/02/2017 0904   CALCIUM 9.1 11/02/2017 0904   GFRNONAA 124 11/02/2017 0904   GFRAA 143 11/02/2017 0904   Lab Results  Component Value Date   HGBA1C 5.5 11/02/2017  HGBA1C 5.7 05/25/2017   HGBA1C 5.1 11/29/2016   HGBA1C 5.8 06/18/2016   HGBA1C 5.6 01/28/2015   Lab Results  Component Value Date   INSULIN 35.6 (H) 11/02/2017   CBC    Component Value Date/Time   WBC 8.4 11/02/2017 0904   WBC 5.9 05/25/2017 0843   RBC 4.39 11/02/2017 0904   RBC 4.68 05/25/2017 0843   HGB 11.8 11/02/2017 0904   HCT 36.7 11/02/2017 0904   PLT 243.0 05/25/2017 0843   MCV 84 11/02/2017 0904   MCH 26.9 11/02/2017 0904   MCH 27.0 03/04/2017 1157   MCHC 32.2 11/02/2017 0904   MCHC 31.8 05/25/2017 0843   RDW 15.1 11/02/2017 0904   LYMPHSABS 2.4 11/02/2017 0904   MONOABS 0.6 05/25/2017 0843   EOSABS 0.3 11/02/2017 0904   BASOSABS 0.0 11/02/2017 0904   Iron/TIBC/Ferritin/ %Sat    Component Value Date/Time   IRON 30 11/02/2017 0904   TIBC 337 11/02/2017 0904   FERRITIN 28 11/02/2017 0904   IRONPCTSAT 9 (LL) 11/02/2017 0904   Lipid Panel     Component Value Date/Time   CHOL 195 11/02/2017 0904   TRIG 116 11/02/2017 0904   HDL 50 11/02/2017 0904   CHOLHDL 3 05/25/2017 0843   VLDL 20.6 05/25/2017 0843   LDLCALC 122 (H) 11/02/2017 0904   Hepatic Function Panel     Component Value Date/Time   PROT 6.5 11/02/2017 0904   ALBUMIN 4.0 11/02/2017 0904   AST 16 11/02/2017 0904   ALT 21 11/02/2017 0904   ALKPHOS 67 11/02/2017 0904   BILITOT <0.2 11/02/2017 0904      Component Value Date/Time   TSH 2.700 11/02/2017 0904   TSH 1.97 05/25/2017 0843   TSH 1.58 06/18/2016 0952  Results for JOCLYNN, LUMB (MRN [id number]) as of 01/04/2018 17:18  Ref. Range 11/02/2017 09:04  Vitamin D, 25-Hydroxy Latest Ref Range: 30.0 - 100.0 ng/mL 36.5    ASSESSMENT AND  PLAN: Vitamin D deficiency - Plan: Vitamin D, Ergocalciferol, (DRISDOL) 50000 units CAPS capsule  Other depression - with emotional eating - Plan: buPROPion (WELLBUTRIN SR) 150 MG 12 hr tablet  Class 3 severe obesity with serious comorbidity and body mass index (BMI) of 50.0 to 59.9 in adult, unspecified obesity type (HCC)  PLAN:  Vitamin D Deficiency Daizy was informed that low vitamin D levels contributes to fatigue and are associated with obesity, breast, and colon cancer. Lamija agrees to continue taking prescription Vit D @50 ,000 IU every week #4 and we will refill for 1 month. She will follow up for routine testing of vitamin D, at least 2-3 times per year. She was informed of the risk of over-replacement of vitamin D and agrees to not increase her dose unless she discusses this with [place] first. Edessa agrees to follow up with our clinic in 2 to 3 weeks.  Depression with Emotional Eating Behaviors We discussed behavior modification techniques today to help Kaleiyah deal with her emotional eating and depression. Neidra agrees to continue taking Wellbutrin SR 150 mg qd #30 and we will refill for 1 month. Shwanda agrees to follow up with our clinic in 2 to 3 weeks.  Obesity Beaux is currently in the action stage of change. As such, her goal is to continue with weight loss efforts She has agreed to follow the Category 4 plan Esra has been instructed to work up to a goal of 150 minutes of combined cardio and strengthening exercise per week for weight loss and overall health  benefits. Start cardio and strengthening. We discussed the following Behavioral Modification Strategies today: increasing lean protein intake, decreasing simple carbohydrates  and work on meal planning and easy cooking plans   Moe has agreed to follow up with our clinic in 2 to 3 weeks. She was informed of the importance of frequent follow up visits to maximize her success with intensive lifestyle modifications  for her multiple health conditions.   OBESITY BEHAVIORAL INTERVENTION VISIT  Today's visit was # 4 out of 22.  Starting weight: 363 lbs Starting date: 11/02/17 Today's weight : 339 lbs Today's date: 01/04/2018 Total lbs lost to date: 24    ASK: We discussed the diagnosis of obesity with Johnsie Cancel today and Dominik agreed to give [place] permission to discuss obesity behavioral modification therapy today.  ASSESS: Ashana has the diagnosis of obesity and her BMI today is 51.56 Falynn is in the action stage of change   ADVISE: Zaliah was educated on the multiple health risks of obesity as well as the benefit of weight loss to improve her health. She was advised of the need for long term treatment and the importance of lifestyle modifications.  AGREE: Multiple dietary modification options and treatment options were discussed and  Judithe agreed to the above obesity treatment plan.  I, Trixie Dredge, am acting as transcriptionist for Dennard Nip, MD  I have reviewed the above documentation for accuracy and completeness, and I agree with the above. -Dennard Nip, MD

## 2018-01-05 DIAGNOSIS — F4322 Adjustment disorder with anxiety: Secondary | ICD-10-CM | POA: Diagnosis not present

## 2018-01-23 ENCOUNTER — Ambulatory Visit (INDEPENDENT_AMBULATORY_CARE_PROVIDER_SITE_OTHER): Payer: Self-pay | Admitting: Physician Assistant

## 2018-01-23 ENCOUNTER — Ambulatory Visit: Payer: 59 | Admitting: Psychology

## 2018-01-23 ENCOUNTER — Ambulatory Visit (INDEPENDENT_AMBULATORY_CARE_PROVIDER_SITE_OTHER): Payer: 59 | Admitting: Family Medicine

## 2018-01-23 VITALS — BP 127/80 | HR 67 | Temp 97.8°F | Ht 68.0 in | Wt 337.0 lb

## 2018-01-23 DIAGNOSIS — Z9189 Other specified personal risk factors, not elsewhere classified: Secondary | ICD-10-CM

## 2018-01-23 DIAGNOSIS — E559 Vitamin D deficiency, unspecified: Secondary | ICD-10-CM

## 2018-01-23 DIAGNOSIS — Z6841 Body Mass Index (BMI) 40.0 and over, adult: Secondary | ICD-10-CM | POA: Diagnosis not present

## 2018-01-23 MED ORDER — VITAMIN D (ERGOCALCIFEROL) 1.25 MG (50000 UNIT) PO CAPS: 50000.0000 [IU] | ORAL_CAPSULE | ORAL | 0 refills | Status: DC

## 2018-01-23 NOTE — Progress Notes (Signed)
Office: [phone]  /  Fax: [phone]   HPI:   Chief Complaint: OBESITY [name] is here to discuss her progress with her obesity treatment plan. [name] is on the Category 4 plan and is following her eating plan approximately 50 % of the time. [name] states [name] is doing cardio for 30 minutes 1 time per week. [name] was on vacation but did well with planning breakfast and lunch and made better choices at dinner. [name] is ready to get back on Category 4 plan but is getting bored with lunch options. Her weight is (!) 337 lb (152.9 kg) today and has had a weight loss of 2 pounds over a period of 2 to 3 weeks since her last visit. [name] has lost 26 lbs since starting treatment with [place].  Vitamin D Deficiency [name] has a diagnosis of vitamin D deficiency. [name] is stable on prescription Vit D, not yet at goal. [name] denies nausea, vomiting or muscle weakness. [name] is due for labs soon.  At risk for cardiovascular disease [name] is at a higher than average risk for cardiovascular disease due to obesity. [name] currently denies any chest pain.  ALLERGIES: No Known Allergies  MEDICATIONS: Current Outpatient Medications on File Prior to Visit  Medication Sig Dispense Refill  . buPROPion (WELLBUTRIN SR) 150 MG 12 hr tablet Take 1 tablet (150 mg total) by mouth daily. 30 tablet 0  . Cholecalciferol 5000 units capsule Take 1 capsule (5,000 Units total) by mouth daily. With meal 30 capsule 11  . Cyanocobalamin 2500 MCG SUBL Place 2,500 mcg under the tongue daily.     [name] estradiol (CLIMARA - DOSED IN MG/24 HR) 0.1 mg/24hr patch Place 0.1 mg onto the skin. Apply and change patch twice weekly    . metFORMIN (GLUCOPHAGE-XR) 500 MG 24 hr tablet Take 3 tablets (1,500 mg total) by mouth daily. 270 tablet 1  . Multiple Vitamin (MULTIVITAMIN) capsule Take 1 capsule by mouth daily.     No current facility-administered medications on file prior to visit.     PAST MEDICAL HISTORY: Past Medical History:  Diagnosis  Date  . Anemia   . Back pain   . BPES syndrome   . Enlarged thyroid   . Infertility, female   . Obesity   . Palpitations   . Palpitations   . PCOS (polycystic ovarian syndrome)   . PONV (postoperative nausea and vomiting)   . Prediabetes   . Premature ovarian failure   . Vitamin B 12 deficiency   . Vitamin D deficiency   . Wears eyeglasses     PAST SURGICAL HISTORY: Past Surgical History:  Procedure Laterality Date  . EYE SURGERY Bilateral   . NASAL SEPTOPLASTY W/ TURBINOPLASTY Bilateral 03/04/2017   Procedure: NASAL SEPTOPLASTY WITH BILATERAL INFERIOR TURBINATE REDUCTION;  Surgeon: Jerrell Belfast, MD;  Location: [place];  Service: ENT;  Laterality: Bilateral;  . NASAL SEPTUM SURGERY  03/04/2017  . TONSILLECTOMY AND ADENOIDECTOMY Bilateral 03/04/2017   Procedure: TONSILLECTOMY;  Surgeon: Jerrell Belfast, MD;  Location: [place];  Service: ENT;  Laterality: Bilateral;    SOCIAL HISTORY: Social History   Tobacco Use  . Smoking status: Never Smoker  . Smokeless tobacco: Never Used  Substance Use Topics  . Alcohol use: Yes    Alcohol/week: 0.0 oz    Comment: socially  . Drug use: No    FAMILY HISTORY: Family History  Problem Relation Age of Onset  . Hyperlipidemia Father   . Hypertension Father   . Heart disease  Father   . Sudden death Father        Age 29  . Other Father        BPES  . Heart attack Father   . Other Other        BPES  . Other Other        BPES  . Hyperlipidemia Mother   . Hypertension Mother   . Diabetes Mother   . Anxiety disorder Mother   . Obesity Mother   . Other Brother        BPES  . Other Sister        BPES  . Other Paternal Uncle        BPES  . Other Cousin        BPES  . Other Cousin        BPES  . Heart attack Maternal Grandmother   . Heart disease Maternal Grandfather   . Heart attack Maternal Grandfather   . Lung cancer Maternal Grandfather   . Heart disease Paternal Grandfather heartr attack  . Heart attack Paternal  Grandfather 16       MI  . Sudden death Paternal Grandfather     ROS: Review of Systems  Constitutional: Positive for weight loss.  Cardiovascular: Negative for chest pain.  Gastrointestinal: Negative for nausea and vomiting.  Musculoskeletal:       Negative muscle weakness    PHYSICAL EXAM: Blood pressure 127/80, pulse 67, temperature 97.8 F (36.6 C), temperature source Oral, height 5\' 8"  (1.727 m), weight (!) 337 lb (152.9 kg), last menstrual period 01/16/2018, SpO2 100 %. Body mass index is 51.24 kg/m. Physical Exam  Constitutional: [name] is oriented to person, place, and time. [name] appears well-developed and well-nourished.  Cardiovascular: Normal rate.  Pulmonary/Chest: Effort normal.  Musculoskeletal: Normal range of motion.  Neurological: [name] is oriented to person, place, and time.  Skin: Skin is warm and dry.  Psychiatric: [name] has a normal mood and affect. Her behavior is normal.  Vitals reviewed.   RECENT LABS AND TESTS: BMET    Component Value Date/Time   NA 137 11/02/2017 0904   K 4.5 11/02/2017 0904   CL 103 11/02/2017 0904   CO2 20 11/02/2017 0904   GLUCOSE 93 11/02/2017 0904   GLUCOSE 89 05/25/2017 0843   BUN 15 11/02/2017 0904   CREATININE 0.58 11/02/2017 0904   CALCIUM 9.1 11/02/2017 0904   GFRNONAA 124 11/02/2017 0904   GFRAA 143 11/02/2017 0904   Lab Results  Component Value Date   HGBA1C 5.5 11/02/2017   HGBA1C 5.7 05/25/2017   HGBA1C 5.1 11/29/2016   HGBA1C 5.8 06/18/2016   HGBA1C 5.6 01/28/2015   Lab Results  Component Value Date   INSULIN 35.6 (H) 11/02/2017   CBC    Component Value Date/Time   WBC 8.4 11/02/2017 0904   WBC 5.9 05/25/2017 0843   RBC 4.39 11/02/2017 0904   RBC 4.68 05/25/2017 0843   HGB 11.8 11/02/2017 0904   HCT 36.7 11/02/2017 0904   PLT 243.0 05/25/2017 0843   MCV 84 11/02/2017 0904   MCH 26.9 11/02/2017 0904   MCH 27.0 03/04/2017 1157   MCHC 32.2 11/02/2017 0904   MCHC 31.8 05/25/2017 0843   RDW 15.1  11/02/2017 0904   LYMPHSABS 2.4 11/02/2017 0904   MONOABS 0.6 05/25/2017 0843   EOSABS 0.3 11/02/2017 0904   BASOSABS 0.0 11/02/2017 0904   Iron/TIBC/Ferritin/ %Sat    Component Value Date/Time   IRON 30 11/02/2017 0904  TIBC 337 11/02/2017 0904   FERRITIN 28 11/02/2017 0904   IRONPCTSAT 9 (LL) 11/02/2017 0904   Lipid Panel     Component Value Date/Time   CHOL 195 11/02/2017 0904   TRIG 116 11/02/2017 0904   HDL 50 11/02/2017 0904   CHOLHDL 3 05/25/2017 0843   VLDL 20.6 05/25/2017 0843   LDLCALC 122 (H) 11/02/2017 0904   Hepatic Function Panel     Component Value Date/Time   PROT 6.5 11/02/2017 0904   ALBUMIN 4.0 11/02/2017 0904   AST 16 11/02/2017 0904   ALT 21 11/02/2017 0904   ALKPHOS 67 11/02/2017 0904   BILITOT <0.2 11/02/2017 0904      Component Value Date/Time   TSH 2.700 11/02/2017 0904   TSH 1.97 05/25/2017 0843   TSH 1.58 06/18/2016 0952  Results for OSIRIS, CHARLES (MRN [id number]) as of 01/23/2018 14:06  Ref. Range 11/02/2017 09:04  Vitamin D, 25-Hydroxy Latest Ref Range: 30.0 - 100.0 ng/mL 36.5    ASSESSMENT AND PLAN: Vitamin D deficiency - Plan: Vitamin D, Ergocalciferol, (DRISDOL) 50000 units CAPS capsule  At risk for heart disease  Class 3 severe obesity with serious comorbidity and body mass index (BMI) of 50.0 to 59.9 in adult, unspecified obesity type ([name])  PLAN:  Vitamin D Deficiency [name] was informed that low vitamin D levels contributes to fatigue and are associated with obesity, breast, and colon cancer. [name] agrees to continue taking prescription Vit D @50 ,000 IU every week #4 and we will refill for 1 month. [name] will follow up for routine testing of vitamin D, at least 2-3 times per year. [name] was informed of the risk of over-replacement of vitamin D and agrees to not increase her dose unless [name] discusses this with [place] first. [name] agrees to follow up with our clinic in 2 to 3 and we will check labs at that time.  Cardiovascular  risk counselling [name] was given extended (15 minutes) coronary artery disease prevention counseling today. [name] is 32 y.o. female and has risk factors for heart disease including obesity. We discussed intensive lifestyle modifications today with an emphasis on specific weight loss instructions and strategies. Pt was also informed of the importance of increasing exercise and decreasing saturated fats to help prevent heart disease.  Obesity [name] is currently in the action stage of change. As such, her goal is to continue with weight loss efforts [name] has agreed to keep a food journal with 400-550 calories and 40 grams of protein at lunch daily and follow the Category 4 plan [name] has been instructed to work up to a goal of 150 minutes of combined cardio and strengthening exercise per week for weight loss and overall health benefits. We discussed the following Behavioral Modification Strategies today: increasing lean protein intake and work on meal planning and easy cooking plans   [name] has agreed to follow up with our clinic in 2 to 3 weeks. [name] was informed of the importance of frequent follow up visits to maximize her success with intensive lifestyle modifications for her multiple health conditions.   OBESITY BEHAVIORAL INTERVENTION VISIT  Today's visit was # 5 out of 22.  Starting weight: 363 lbs Starting date: 11/02/17 Today's weight : 337 lbs Today's date: 01/23/2018 Total lbs lost to date: 75    ASK: We discussed the diagnosis of obesity with [name] today and [name] agreed to give [place] permission to discuss obesity behavioral modification therapy today.  ASSESS: [name] has the diagnosis of obesity and  her BMI today is 51.25 [name] is in the action stage of change   ADVISE: [name] was educated on the multiple health risks of obesity as well as the benefit of weight loss to improve her health. [name] was advised of the need for long term treatment and the importance  of lifestyle modifications.  AGREE: Multiple dietary modification options and treatment options were discussed and  [name] agreed to the above obesity treatment plan.  I, Trixie Dredge, am acting as transcriptionist for Dennard Nip, MD  I have reviewed the above documentation for accuracy and completeness, and I agree with the above. -Dennard Nip, MD

## 2018-02-02 ENCOUNTER — Ambulatory Visit: Payer: 59 | Admitting: Psychology

## 2018-02-02 DIAGNOSIS — F411 Generalized anxiety disorder: Secondary | ICD-10-CM

## 2018-02-15 ENCOUNTER — Ambulatory Visit (INDEPENDENT_AMBULATORY_CARE_PROVIDER_SITE_OTHER): Payer: Self-pay | Admitting: Family Medicine

## 2018-02-16 ENCOUNTER — Ambulatory Visit (INDEPENDENT_AMBULATORY_CARE_PROVIDER_SITE_OTHER): Payer: 59 | Admitting: Physician Assistant

## 2018-02-16 ENCOUNTER — Encounter (INDEPENDENT_AMBULATORY_CARE_PROVIDER_SITE_OTHER): Payer: Self-pay | Admitting: Physician Assistant

## 2018-02-16 VITALS — BP 129/79 | HR 75 | Temp 97.8°F | Ht 68.0 in | Wt 339.0 lb

## 2018-02-16 DIAGNOSIS — E559 Vitamin D deficiency, unspecified: Secondary | ICD-10-CM

## 2018-02-16 DIAGNOSIS — E7849 Other hyperlipidemia: Secondary | ICD-10-CM | POA: Diagnosis not present

## 2018-02-16 DIAGNOSIS — R7303 Prediabetes: Secondary | ICD-10-CM

## 2018-02-16 DIAGNOSIS — Z6841 Body Mass Index (BMI) 40.0 and over, adult: Secondary | ICD-10-CM

## 2018-02-16 DIAGNOSIS — Z9189 Other specified personal risk factors, not elsewhere classified: Secondary | ICD-10-CM | POA: Diagnosis not present

## 2018-02-16 NOTE — Progress Notes (Signed)
Office: [phone]  /  Fax: [phone]   HPI:   Chief Complaint: OBESITY [name] is here to discuss her progress with her obesity treatment plan. She is on the keep a food journal with 400-550 calories and 40 grams of protein at lunch daily and follow the Category 4 plan and is following her eating plan approximately 75 % of the time. She states she is doing cardio for 30 minutes 2 times per week. [name] reports that she struggled to get in all of her protein during the day, especially at lunch. She is requesting lunch ideas today.  Her weight is (!) 339 lb (153.8 kg) today and has gained 2 pounds since her last visit. She has lost 24 lbs since starting treatment with [place].  Pre-Diabetes [name] has a diagnosis of pre-diabetes based on her elevated Hgb A1c and was informed this puts her at greater risk of developing diabetes. She is on metformin and she denies polyphagia, hypoglycemia, nausea, vomiting, or diarrhea. She continues to work on diet and exercise to decrease risk of diabetes.   Vitamin D Deficiency [name] has a diagnosis of vitamin D deficiency. She is on prescription Vit D and denies nausea, vomiting or muscle weakness.  At risk for osteopenia and osteoporosis [name] is at higher risk of osteopenia and osteoporosis due to vitamin D deficiency.   Hyperlipidemia [name] has hyperlipidemia and has been trying to improve her cholesterol levels with intensive lifestyle modification including a low saturated fat diet, exercise and weight loss. She is not on medications and denies any chest pain, claudication or myalgias.  ALLERGIES: No Known Allergies  MEDICATIONS: Current Outpatient Medications on File Prior to Visit  Medication Sig Dispense Refill  . buPROPion (WELLBUTRIN SR) 150 MG 12 hr tablet Take 1 tablet (150 mg total) by mouth daily. 30 tablet 0  . Cholecalciferol 5000 units capsule Take 1 capsule (5,000 Units total) by mouth daily. With meal 30 capsule 11  .  Cyanocobalamin 2500 MCG SUBL Place 2,500 mcg under the tongue daily.     [name] estradiol (CLIMARA - DOSED IN MG/24 HR) 0.1 mg/24hr patch Place 0.1 mg onto the skin. Apply and change patch twice weekly    . metFORMIN (GLUCOPHAGE-XR) 500 MG 24 hr tablet Take 3 tablets (1,500 mg total) by mouth daily. 270 tablet 1  . Multiple Vitamin (MULTIVITAMIN) capsule Take 1 capsule by mouth daily.    . Vitamin D, Ergocalciferol, (DRISDOL) 50000 units CAPS capsule Take 1 capsule (50,000 Units total) by mouth every 7 (seven) days. 4 capsule 0   No current facility-administered medications on file prior to visit.     PAST MEDICAL HISTORY: Past Medical History:  Diagnosis Date  . Anemia   . Back pain   . BPES syndrome   . Enlarged thyroid   . Infertility, female   . Obesity   . Palpitations   . Palpitations   . PCOS (polycystic ovarian syndrome)   . PONV (postoperative nausea and vomiting)   . Prediabetes   . Premature ovarian failure   . Vitamin B 12 deficiency   . Vitamin D deficiency   . Wears eyeglasses     PAST SURGICAL HISTORY: Past Surgical History:  Procedure Laterality Date  . EYE SURGERY Bilateral   . NASAL SEPTOPLASTY W/ TURBINOPLASTY Bilateral 03/04/2017   Procedure: NASAL SEPTOPLASTY WITH BILATERAL INFERIOR TURBINATE REDUCTION;  Surgeon: Jerrell Belfast, MD;  Location: [place];  Service: ENT;  Laterality: Bilateral;  . NASAL SEPTUM SURGERY  03/04/2017  .  TONSILLECTOMY AND ADENOIDECTOMY Bilateral 03/04/2017   Procedure: TONSILLECTOMY;  Surgeon: Jerrell Belfast, MD;  Location: [place];  Service: ENT;  Laterality: Bilateral;    SOCIAL HISTORY: Social History   Tobacco Use  . Smoking status: Never Smoker  . Smokeless tobacco: Never Used  Substance Use Topics  . Alcohol use: Yes    Alcohol/week: 0.0 standard drinks    Comment: socially  . Drug use: No    FAMILY HISTORY: Family History  Problem Relation Age of Onset  . Hyperlipidemia Father   . Hypertension Father   . Heart  disease Father   . Sudden death Father        Age 53  . Other Father        BPES  . Heart attack Father   . Other Other        BPES  . Other Other        BPES  . Hyperlipidemia Mother   . Hypertension Mother   . Diabetes Mother   . Anxiety disorder Mother   . Obesity Mother   . Other Brother        BPES  . Other Sister        BPES  . Other Paternal Uncle        BPES  . Other Cousin        BPES  . Other Cousin        BPES  . Heart attack Maternal Grandmother   . Heart disease Maternal Grandfather   . Heart attack Maternal Grandfather   . Lung cancer Maternal Grandfather   . Heart disease Paternal Grandfather heartr attack  . Heart attack Paternal Grandfather 62       MI  . Sudden death Paternal Grandfather     ROS: Review of Systems  Constitutional: Negative for weight loss.  Cardiovascular: Negative for chest pain and claudication.  Gastrointestinal: Negative for diarrhea, nausea and vomiting.  Musculoskeletal: Negative for myalgias.       Negative muscle weakness  Endo/Heme/Allergies:       Negative polyphagia Negative hypoglycemia    PHYSICAL EXAM: Blood pressure 129/79, pulse 75, temperature 97.8 F (36.6 C), temperature source Oral, height 5\' 8"  (1.727 m), weight (!) 339 lb (153.8 kg), SpO2 100 %. Body mass index is 51.54 kg/m. Physical Exam  Constitutional: She is oriented to person, place, and time. She appears well-developed and well-nourished.  Cardiovascular: Normal rate.  Pulmonary/Chest: Effort normal.  Musculoskeletal: Normal range of motion.  Neurological: She is oriented to person, place, and time.  Skin: Skin is warm and dry.  Psychiatric: She has a normal mood and affect. Her behavior is normal.  Vitals reviewed.   RECENT LABS AND TESTS: BMET    Component Value Date/Time   NA 137 11/02/2017 0904   K 4.5 11/02/2017 0904   CL 103 11/02/2017 0904   CO2 20 11/02/2017 0904   GLUCOSE 93 11/02/2017 0904   GLUCOSE 89 05/25/2017 0843    BUN 15 11/02/2017 0904   CREATININE 0.58 11/02/2017 0904   CALCIUM 9.1 11/02/2017 0904   GFRNONAA 124 11/02/2017 0904   GFRAA 143 11/02/2017 0904   Lab Results  Component Value Date   HGBA1C 5.5 11/02/2017   HGBA1C 5.7 05/25/2017   HGBA1C 5.1 11/29/2016   HGBA1C 5.8 06/18/2016   HGBA1C 5.6 01/28/2015   Lab Results  Component Value Date   INSULIN 35.6 (H) 11/02/2017   CBC    Component Value Date/Time   WBC 8.4  11/02/2017 0904   WBC 5.9 05/25/2017 0843   RBC 4.39 11/02/2017 0904   RBC 4.68 05/25/2017 0843   HGB 11.8 11/02/2017 0904   HCT 36.7 11/02/2017 0904   PLT 243.0 05/25/2017 0843   MCV 84 11/02/2017 0904   MCH 26.9 11/02/2017 0904   MCH 27.0 03/04/2017 1157   MCHC 32.2 11/02/2017 0904   MCHC 31.8 05/25/2017 0843   RDW 15.1 11/02/2017 0904   LYMPHSABS 2.4 11/02/2017 0904   MONOABS 0.6 05/25/2017 0843   EOSABS 0.3 11/02/2017 0904   BASOSABS 0.0 11/02/2017 0904   Iron/TIBC/Ferritin/ %Sat    Component Value Date/Time   IRON 30 11/02/2017 0904   TIBC 337 11/02/2017 0904   FERRITIN 28 11/02/2017 0904   IRONPCTSAT 9 (LL) 11/02/2017 0904   Lipid Panel     Component Value Date/Time   CHOL 195 11/02/2017 0904   TRIG 116 11/02/2017 0904   HDL 50 11/02/2017 0904   CHOLHDL 3 05/25/2017 0843   VLDL 20.6 05/25/2017 0843   LDLCALC 122 (H) 11/02/2017 0904   Hepatic Function Panel     Component Value Date/Time   PROT 6.5 11/02/2017 0904   ALBUMIN 4.0 11/02/2017 0904   AST 16 11/02/2017 0904   ALT 21 11/02/2017 0904   ALKPHOS 67 11/02/2017 0904   BILITOT <0.2 11/02/2017 0904      Component Value Date/Time   TSH 2.700 11/02/2017 0904   TSH 1.97 05/25/2017 0843   TSH 1.58 06/18/2016 0952  Results for NAELANI, LAFRANCE (MRN [id number]) as of 02/16/2018 16:31  Ref. Range 11/02/2017 09:04  Vitamin D, 25-Hydroxy Latest Ref Range: 30.0 - 100.0 ng/mL 36.5    ASSESSMENT AND PLAN: Prediabetes - Plan: Comprehensive metabolic panel, Hemoglobin A1c, Insulin,  random  Vitamin D deficiency - Plan: VITAMIN D 25 Hydroxy (Vit-D Deficiency, Fractures)  Other hyperlipidemia - Plan: Lipid Panel With LDL/HDL Ratio  At risk for osteoporosis  Class 3 severe obesity with serious comorbidity and body mass index (BMI) of 50.0 to 59.9 in adult, unspecified obesity type ([place])  PLAN:  Pre-Diabetes [name] will continue to work on weight loss, diet, exercise, and decreasing simple carbohydrates in her diet to help decrease the risk of diabetes. We dicussed metformin including benefits and risks. She was informed that eating too many simple carbohydrates or too many calories at one sitting increases the likelihood of GI side effects. [name] agrees to continue taking metformin, we will check labs, and she agrees to follow up with our clinic in 2 to 3 weeks as directed to monitor her progress.  Vitamin D Deficiency [name] was informed that low vitamin D levels contributes to fatigue and are associated with obesity, breast, and colon cancer. [name] agrees to continue taking prescription Vit D @50 ,000 IU every week and will follow up for routine testing of vitamin D, at least 2-3 times per year. She was informed of the risk of over-replacement of vitamin D and agrees to not increase her dose unless she discusses this with [place] first. We will check labs and [name] agrees to follow up with our clinic in 2 to 3 weeks.  At risk for osteopenia and osteoporosis [name] is at risk for osteopenia and osteoporsis due to her vitamin D deficiency. She was encouraged to take her vitamin D and follow her higher calcium diet and increase strengthening exercise to help strengthen her bones and decrease her risk of osteopenia and osteoporosis.  Hyperlipidemia [name] was informed of the American Heart Association Guidelines emphasizing intensive  lifestyle modifications as the first line treatment for hyperlipidemia. We discussed many lifestyle modifications today in depth, and [name]  will continue to work on decreasing saturated fats such as fatty red meat, butter and many fried foods. She will also increase vegetables and lean protein in her diet and continue to work on diet, exercise, and weight loss efforts. We will check labs and Jessicca agrees to follow up with our clinic in 2 to 3 weeks.  Obesity [name] is currently in the action stage of change. As such, her goal is to continue with weight loss efforts She has agreed to keep a food journal with 400-550 calories and 40 grams of protein at lunch daily and follow the Category 4 plan [name] has been instructed to work up to a goal of 150 minutes of combined cardio and strengthening exercise per week for weight loss and overall health benefits. We discussed the following Behavioral Modification Strategies today: increasing lean protein intake and work on meal planning and easy cooking plans   [name] has agreed to follow up with our clinic in 2 to 3 weeks. She was informed of the importance of frequent follow up visits to maximize her success with intensive lifestyle modifications for her multiple health conditions.   OBESITY BEHAVIORAL INTERVENTION VISIT  Today's visit was # 6.  Starting weight: 363 lbs  Starting date: 11/02/17 Today's weight : 339 lbs Today's date: 02/16/2018 Total lbs lost to date: 24    ASK: We discussed the diagnosis of obesity with [name] today and [name] agreed to give [place] permission to discuss obesity behavioral modification therapy today.  ASSESS: [name] has the diagnosis of obesity and her BMI today is 51.56 [name] is in the action stage of change   ADVISE: [name] was educated on the multiple health risks of obesity as well as the benefit of weight loss to improve her health. She was advised of the need for long term treatment and the importance of lifestyle modifications.  AGREE: Multiple dietary modification options and treatment options were discussed and  [name]  agreed to the above obesity treatment plan.  Wilhemena Durie, am acting as transcriptionist for Abby Potash, PA-C I, Abby Potash, PA-C have reviewed above note and agree with its content

## 2018-02-17 LAB — COMPREHENSIVE METABOLIC PANEL WITH GFR
ALT: 11 IU/L (ref 0–32)
AST: 10 IU/L (ref 0–40)
Albumin/Globulin Ratio: 1.6 (ref 1.2–2.2)
Albumin: 4.1 g/dL (ref 3.5–5.5)
Alkaline Phosphatase: 69 IU/L (ref 39–117)
BUN/Creatinine Ratio: 23 (ref 9–23)
BUN: 14 mg/dL (ref 6–20)
Bilirubin Total: 0.3 mg/dL (ref 0.0–1.2)
CO2: 23 mmol/L (ref 20–29)
Calcium: 9.3 mg/dL (ref 8.7–10.2)
Chloride: 104 mmol/L (ref 96–106)
Creatinine, Ser: 0.62 mg/dL (ref 0.57–1.00)
GFR calc Af Amer: 140 mL/min/1.73
GFR calc non Af Amer: 121 mL/min/1.73
Globulin, Total: 2.5 g/dL (ref 1.5–4.5)
Glucose: 87 mg/dL (ref 65–99)
Potassium: 4.5 mmol/L (ref 3.5–5.2)
Sodium: 142 mmol/L (ref 134–144)
Total Protein: 6.6 g/dL (ref 6.0–8.5)

## 2018-02-17 LAB — LIPID PANEL WITH LDL/HDL RATIO
Cholesterol, Total: 191 mg/dL (ref 100–199)
HDL: 41 mg/dL
LDL Calculated: 123 mg/dL — ABNORMAL HIGH (ref 0–99)
LDl/HDL Ratio: 3 ratio (ref 0.0–3.2)
Triglycerides: 137 mg/dL (ref 0–149)
VLDL Cholesterol Cal: 27 mg/dL (ref 5–40)

## 2018-02-17 LAB — INSULIN, RANDOM: INSULIN: 16.5 u[IU]/mL (ref 2.6–24.9)

## 2018-02-17 LAB — HEMOGLOBIN A1C
Est. average glucose Bld gHb Est-mCnc: 105 mg/dL
Hgb A1c MFr Bld: 5.3 % (ref 4.8–5.6)

## 2018-02-17 LAB — VITAMIN D 25 HYDROXY (VIT D DEFICIENCY, FRACTURES): Vit D, 25-Hydroxy: 60.9 ng/mL (ref 30.0–100.0)

## 2018-02-21 ENCOUNTER — Ambulatory Visit: Payer: 59 | Admitting: Psychology

## 2018-02-23 ENCOUNTER — Other Ambulatory Visit (INDEPENDENT_AMBULATORY_CARE_PROVIDER_SITE_OTHER): Payer: Self-pay | Admitting: Family Medicine

## 2018-02-23 DIAGNOSIS — F4322 Adjustment disorder with anxiety: Secondary | ICD-10-CM | POA: Diagnosis not present

## 2018-02-23 DIAGNOSIS — F3289 Other specified depressive episodes: Secondary | ICD-10-CM

## 2018-02-27 ENCOUNTER — Ambulatory Visit: Payer: 59 | Admitting: Psychology

## 2018-02-27 DIAGNOSIS — F411 Generalized anxiety disorder: Secondary | ICD-10-CM

## 2018-02-28 DIAGNOSIS — N911 Secondary amenorrhea: Secondary | ICD-10-CM | POA: Diagnosis not present

## 2018-02-28 DIAGNOSIS — Q103 Other congenital malformations of eyelid: Secondary | ICD-10-CM | POA: Diagnosis not present

## 2018-02-28 DIAGNOSIS — E2839 Other primary ovarian failure: Secondary | ICD-10-CM | POA: Diagnosis not present

## 2018-03-09 ENCOUNTER — Ambulatory Visit (INDEPENDENT_AMBULATORY_CARE_PROVIDER_SITE_OTHER): Payer: 59 | Admitting: Family Medicine

## 2018-03-09 VITALS — BP 121/75 | HR 72 | Temp 98.4°F | Ht 68.0 in | Wt 335.0 lb

## 2018-03-09 DIAGNOSIS — F3289 Other specified depressive episodes: Secondary | ICD-10-CM | POA: Diagnosis not present

## 2018-03-09 DIAGNOSIS — Z6841 Body Mass Index (BMI) 40.0 and over, adult: Secondary | ICD-10-CM | POA: Diagnosis not present

## 2018-03-09 DIAGNOSIS — Z9189 Other specified personal risk factors, not elsewhere classified: Secondary | ICD-10-CM

## 2018-03-09 DIAGNOSIS — E559 Vitamin D deficiency, unspecified: Secondary | ICD-10-CM

## 2018-03-09 MED ORDER — BUPROPION HCL ER (SR) 150 MG PO TB12: 150.0000 mg | ORAL_TABLET | Freq: Every day | ORAL | 0 refills | Status: DC

## 2018-03-13 NOTE — Progress Notes (Signed)
Office: [phone]  /  Fax: [phone]   HPI:   Chief Complaint: OBESITY [name] is here to discuss her progress with her obesity treatment plan. She is on the keep a food journal with 400 to 550 calories and 40 grams of protein at lunch daily and the Category 4 plan and is following her eating plan approximately 75 % of the time. She states she is doing cardio 30 minutes 2 times per week. [name] is doing better with meal planning, especially with journaling for lunch. She does note increased polyphagia with her protein decrease. Her weight is (!) 335 lb (152 kg) today and has had a weight loss of 4 pounds over a period of 3 weeks since her last visit. She has lost 28 lbs since starting treatment with [place].  Vitamin D deficiency [name] has a diagnosis of vitamin D deficiency. She is currently taking OTC vit D and prescription vit D and she is now at goal. [name] denies nausea, vomiting or muscle weakness.  At risk for diabetes [name] is at higher than average risk for developing diabetes due to her obesity. She currently denies polyuria or polydipsia.  Depression with emotional eating behaviors [name] struggles with emotional eating and using food for comfort to the extent that it is negatively impacting her health. She often snacks when she is not hungry. [name] sometimes feels she is out of control and then feels guilty that she made poor food choices. Her mood is stable on wellbutrin and she has decreased emotional eating. [name] misses some doses of wellbutrin, but she is working on this. She has been working on behavior modification techniques to help reduce her emotional eating and has been somewhat successful. She shows no sign of suicidal or homicidal ideation.  Depression screen [name] 2/9 11/02/2017 05/25/2017 06/18/2016 06/28/2014  Decreased Interest 3 0 0 0  Down, Depressed, Hopeless 2 0 0 0  PHQ - 2 Score 5 0 0 0  Altered sleeping 2 - - -  Tired, decreased energy 3 - - -    Change in appetite 3 - - -  Feeling bad or failure about yourself  3 - - -  Trouble concentrating 1 - - -  Moving slowly or fidgety/restless 1 - - -  Suicidal thoughts 0 - - -  PHQ-9 Score 18 - - -  Difficult doing work/chores Somewhat difficult - - -      ALLERGIES: No Known Allergies  MEDICATIONS: Current Outpatient Medications on File Prior to Visit  Medication Sig Dispense Refill  . Cholecalciferol 5000 units capsule Take 1 capsule (5,000 Units total) by mouth daily. With meal 30 capsule 11  . Cyanocobalamin 2500 MCG SUBL Place 2,500 mcg under the tongue daily.     [name] estradiol (CLIMARA - DOSED IN MG/24 HR) 0.1 mg/24hr patch Place 0.1 mg onto the skin. Apply and change patch twice weekly    . metFORMIN (GLUCOPHAGE-XR) 500 MG 24 hr tablet Take 3 tablets (1,500 mg total) by mouth daily. 270 tablet 1  . Multiple Vitamin (MULTIVITAMIN) capsule Take 1 capsule by mouth daily.     No current facility-administered medications on file prior to visit.     PAST MEDICAL HISTORY: Past Medical History:  Diagnosis Date  . Anemia   . Back pain   . BPES syndrome   . Enlarged thyroid   . Infertility, female   . Obesity   . Palpitations   . Palpitations   . PCOS (polycystic ovarian syndrome)   .  PONV (postoperative nausea and vomiting)   . Prediabetes   . Premature ovarian failure   . Vitamin B 12 deficiency   . Vitamin D deficiency   . Wears eyeglasses     PAST SURGICAL HISTORY: Past Surgical History:  Procedure Laterality Date  . EYE SURGERY Bilateral   . NASAL SEPTOPLASTY W/ TURBINOPLASTY Bilateral 03/04/2017   Procedure: NASAL SEPTOPLASTY WITH BILATERAL INFERIOR TURBINATE REDUCTION;  Surgeon: Jerrell Belfast, MD;  Location: [place];  Service: ENT;  Laterality: Bilateral;  . NASAL SEPTUM SURGERY  03/04/2017  . TONSILLECTOMY AND ADENOIDECTOMY Bilateral 03/04/2017   Procedure: TONSILLECTOMY;  Surgeon: Jerrell Belfast, MD;  Location: [place];  Service: ENT;  Laterality: Bilateral;     SOCIAL HISTORY: Social History   Tobacco Use  . Smoking status: Never Smoker  . Smokeless tobacco: Never Used  Substance Use Topics  . Alcohol use: Yes    Alcohol/week: 0.0 standard drinks    Comment: socially  . Drug use: No    FAMILY HISTORY: Family History  Problem Relation Age of Onset  . Hyperlipidemia Father   . Hypertension Father   . Heart disease Father   . Sudden death Father        Age 74  . Other Father        BPES  . Heart attack Father   . Other Other        BPES  . Other Other        BPES  . Hyperlipidemia Mother   . Hypertension Mother   . Diabetes Mother   . Anxiety disorder Mother   . Obesity Mother   . Other Brother        BPES  . Other Sister        BPES  . Other Paternal Uncle        BPES  . Other Cousin        BPES  . Other Cousin        BPES  . Heart attack Maternal Grandmother   . Heart disease Maternal Grandfather   . Heart attack Maternal Grandfather   . Lung cancer Maternal Grandfather   . Heart disease Paternal Grandfather heartr attack  . Heart attack Paternal Grandfather 76       MI  . Sudden death Paternal Grandfather     ROS: Review of Systems  Constitutional: Positive for weight loss.  Gastrointestinal: Negative for nausea and vomiting.  Genitourinary: Negative for frequency.  Musculoskeletal:       Negative for muscle weakness  Endo/Heme/Allergies: Negative for polydipsia.  Psychiatric/Behavioral: Positive for depression. Negative for suicidal ideas.    PHYSICAL EXAM: Blood pressure 121/75, pulse 72, temperature 98.4 F (36.9 C), temperature source Oral, height 5\' 8"  (1.727 m), weight (!) 335 lb (152 kg), SpO2 100 %. Body mass index is 50.94 kg/m. Physical Exam  Constitutional: She is oriented to person, place, and time. She appears well-developed and well-nourished.  Cardiovascular: Normal rate.  Pulmonary/Chest: Effort normal.  Musculoskeletal: Normal range of motion.  Neurological: She is oriented to  person, place, and time.  Skin: Skin is warm and dry.  Psychiatric: She has a normal mood and affect. Her behavior is normal.  Vitals reviewed.   RECENT LABS AND TESTS: BMET    Component Value Date/Time   NA 142 02/16/2018 1133   K 4.5 02/16/2018 1133   CL 104 02/16/2018 1133   CO2 23 02/16/2018 1133   GLUCOSE 87 02/16/2018 1133   GLUCOSE 89 05/25/2017  0843   BUN 14 02/16/2018 1133   CREATININE 0.62 02/16/2018 1133   CALCIUM 9.3 02/16/2018 1133   GFRNONAA 121 02/16/2018 1133   GFRAA 140 02/16/2018 1133   Lab Results  Component Value Date   HGBA1C 5.3 02/16/2018   HGBA1C 5.5 11/02/2017   HGBA1C 5.7 05/25/2017   HGBA1C 5.1 11/29/2016   HGBA1C 5.8 06/18/2016   Lab Results  Component Value Date   INSULIN 16.5 02/16/2018   INSULIN 35.6 (H) 11/02/2017   CBC    Component Value Date/Time   WBC 8.4 11/02/2017 0904   WBC 5.9 05/25/2017 0843   RBC 4.39 11/02/2017 0904   RBC 4.68 05/25/2017 0843   HGB 11.8 11/02/2017 0904   HCT 36.7 11/02/2017 0904   PLT 243.0 05/25/2017 0843   MCV 84 11/02/2017 0904   MCH 26.9 11/02/2017 0904   MCH 27.0 03/04/2017 1157   MCHC 32.2 11/02/2017 0904   MCHC 31.8 05/25/2017 0843   RDW 15.1 11/02/2017 0904   LYMPHSABS 2.4 11/02/2017 0904   MONOABS 0.6 05/25/2017 0843   EOSABS 0.3 11/02/2017 0904   BASOSABS 0.0 11/02/2017 0904   Iron/TIBC/Ferritin/ %Sat    Component Value Date/Time   IRON 30 11/02/2017 0904   TIBC 337 11/02/2017 0904   FERRITIN 28 11/02/2017 0904   IRONPCTSAT 9 (LL) 11/02/2017 0904   Lipid Panel     Component Value Date/Time   CHOL 191 02/16/2018 1133   TRIG 137 02/16/2018 1133   HDL 41 02/16/2018 1133   CHOLHDL 3 05/25/2017 0843   VLDL 20.6 05/25/2017 0843   LDLCALC 123 (H) 02/16/2018 1133   Hepatic Function Panel     Component Value Date/Time   PROT 6.6 02/16/2018 1133   ALBUMIN 4.1 02/16/2018 1133   AST 10 02/16/2018 1133   ALT 11 02/16/2018 1133   ALKPHOS 69 02/16/2018 1133   BILITOT 0.3 02/16/2018  1133      Component Value Date/Time   TSH 2.700 11/02/2017 0904   TSH 1.97 05/25/2017 0843   TSH 1.58 06/18/2016 0952   Results for MELENY, TREGONING (MRN [id number]) as of 03/13/2018 10:41  Ref. Range 02/16/2018 11:33  Vitamin D, 25-Hydroxy Latest Ref Range: 30.0 - 100.0 ng/mL 60.9   ASSESSMENT AND PLAN: Other depression - with emotional eating - Plan: buPROPion (WELLBUTRIN SR) 150 MG 12 hr tablet  Vitamin D deficiency  At risk for diabetes mellitus  Class 3 severe obesity with serious comorbidity and body mass index (BMI) of 50.0 to 59.9 in adult, unspecified obesity type ([facility])  Other depression - with emotional eating - Plan: buPROPion (WELLBUTRIN SR) 150 MG 12 hr tablet  PLAN:  Vitamin D Deficiency Jeweliana was informed that low vitamin D levels contributes to fatigue and are associated with obesity, breast, and colon cancer. She agrees to hold prescription Vit D @50 ,000 IU every week and continue OTC Vit D 5,000 IU daily. She will follow up for routine testing of vitamin D, at least 2-3 times per year. She was informed of the risk of over-replacement of vitamin D and agrees to not increase her dose unless she discusses this with [place] first. Gwendolyne agrees to follow up as directed.  Diabetes risk counseling Jaedin was given extended (15 minutes) diabetes prevention counseling today. She is 32 y.o. female and has risk factors for diabetes including obesity. We discussed intensive lifestyle modifications today with an emphasis on weight loss as well as increasing exercise and decreasing simple carbohydrates in her diet.  Depression with  Emotional Eating Behaviors We discussed behavior modification techniques today to help Lounell deal with her emotional eating and depression. She has agreed to continue Wellbutrin SR 150 mg qd #30 with no refills and follow up as directed.  Obesity Wave is currently in the action stage of change. As such, her goal is to continue with weight loss  efforts She has agreed to follow the Category 4 plan Tanyika has been instructed to work up to a goal of 150 minutes of combined cardio and strengthening exercise per week for weight loss and overall health benefits. We discussed the following Behavioral Modification Strategies today: increasing lean protein intake and work on meal planning and easy cooking plans  Dessiree has agreed to follow up with our clinic in 3 weeks. She was informed of the importance of frequent follow up visits to maximize her success with intensive lifestyle modifications for her multiple health conditions.   OBESITY BEHAVIORAL INTERVENTION VISIT  Today's visit was # 7   Starting weight: 363 lbs Starting date: 11/02/17 Today's weight : 335 lbs  Today's date: 03/09/2018 Total lbs lost to date: 23   ASK: We discussed the diagnosis of obesity with Johnsie Cancel today and Marnisha agreed to give [place] permission to discuss obesity behavioral modification therapy today.  ASSESS: Veronika has the diagnosis of obesity and her BMI today is 50.95 Precious is in the action stage of change   ADVISE: Seletha was educated on the multiple health risks of obesity as well as the benefit of weight loss to improve her health. She was advised of the need for long term treatment and the importance of lifestyle modifications to improve her current health and to decrease her risk of future health problems.  AGREE: Multiple dietary modification options and treatment options were discussed and  Bentlee agreed to follow the recommendations documented in the above note.  ARRANGE: [place] was educated on the importance of frequent visits to treat obesity as outlined per CMS and USPSTF guidelines and agreed to schedule her next follow up appointment today.  I, Doreene Nest, am acting as transcriptionist for Dennard Nip, MD  I have reviewed the above documentation for accuracy and completeness, and I agree with the above. -Dennard Nip, MD

## 2018-03-27 ENCOUNTER — Ambulatory Visit (INDEPENDENT_AMBULATORY_CARE_PROVIDER_SITE_OTHER): Payer: 59 | Admitting: Psychology

## 2018-03-27 DIAGNOSIS — F4322 Adjustment disorder with anxiety: Secondary | ICD-10-CM | POA: Diagnosis not present

## 2018-03-27 DIAGNOSIS — F411 Generalized anxiety disorder: Secondary | ICD-10-CM

## 2018-03-28 ENCOUNTER — Ambulatory Visit (INDEPENDENT_AMBULATORY_CARE_PROVIDER_SITE_OTHER): Payer: 59 | Admitting: Family Medicine

## 2018-03-28 VITALS — BP 138/80 | HR 63 | Temp 98.2°F | Ht 68.0 in | Wt 334.0 lb

## 2018-03-28 DIAGNOSIS — F3289 Other specified depressive episodes: Secondary | ICD-10-CM | POA: Diagnosis not present

## 2018-03-28 DIAGNOSIS — Z9189 Other specified personal risk factors, not elsewhere classified: Secondary | ICD-10-CM | POA: Diagnosis not present

## 2018-03-28 DIAGNOSIS — Z6841 Body Mass Index (BMI) 40.0 and over, adult: Secondary | ICD-10-CM

## 2018-03-28 DIAGNOSIS — L258 Unspecified contact dermatitis due to other agents: Secondary | ICD-10-CM | POA: Diagnosis not present

## 2018-03-28 MED ORDER — BUPROPION HCL ER (SR) 150 MG PO TB12: 150.0000 mg | ORAL_TABLET | Freq: Every day | ORAL | 0 refills | Status: DC

## 2018-03-29 NOTE — Progress Notes (Signed)
Office: [phone]  /  Fax: [phone]   HPI:   Chief Complaint: OBESITY [name] is here to discuss her progress with her obesity treatment plan. [name] is on the Category 4 plan with lunch options and is following her eating plan approximately 75 to 80 % of the time. [name] states [name] is exercising 0 minutes 0 times per week. [name] continues to lose weight slowly even with increased temptations with celebration eating. [name] was mindful about her food options and worked to increase lean protein.  Her weight is (!) 334 lb (151.5 kg) today and has had a weight loss of 1 pound over a period of 3 weeks since her last visit. [name] has lost 29 lbs since starting treatment with [place].  Depression with emotional eating behaviors [name] is struggling with emotional eating and using food for comfort to the extent that it is negatively impacting her health. [name] states her mood is stable on Wellbutrin and feels [name] is having reduced irritability and emotional eating.  Contact Dermatitis [name] wears an estrogen patch and notices a diffusely erythematous rash directly beneath her patch, which is pruritic. [name] wants to see if [name] can continue the patch instead of birth control pills.   At risk for cardiovascular disease [name] is at a higher than average risk for cardiovascular disease due to obesity.   ALLERGIES: No Known Allergies  MEDICATIONS: Current Outpatient Medications on File Prior to Visit  Medication Sig Dispense Refill  . Cholecalciferol 5000 units capsule Take 1 capsule (5,000 Units total) by mouth daily. With meal 30 capsule 11  . Cyanocobalamin 2500 MCG SUBL Place 2,500 mcg under the tongue daily.     [name] estradiol (CLIMARA - DOSED IN MG/24 HR) 0.1 mg/24hr patch Place 0.1 mg onto the skin. Apply and change patch twice weekly    . metFORMIN (GLUCOPHAGE-XR) 500 MG 24 hr tablet Take 3 tablets (1,500 mg total) by mouth daily. 270 tablet 1  . Multiple Vitamin (MULTIVITAMIN) capsule Take 1  capsule by mouth daily.     No current facility-administered medications on file prior to visit.     PAST MEDICAL HISTORY: Past Medical History:  Diagnosis Date  . Anemia   . Back pain   . BPES syndrome   . Enlarged thyroid   . Infertility, female   . Obesity   . Palpitations   . Palpitations   . PCOS (polycystic ovarian syndrome)   . PONV (postoperative nausea and vomiting)   . Prediabetes   . Premature ovarian failure   . Vitamin B 12 deficiency   . Vitamin D deficiency   . Wears eyeglasses     PAST SURGICAL HISTORY: Past Surgical History:  Procedure Laterality Date  . EYE SURGERY Bilateral   . NASAL SEPTOPLASTY W/ TURBINOPLASTY Bilateral 03/04/2017   Procedure: NASAL SEPTOPLASTY WITH BILATERAL INFERIOR TURBINATE REDUCTION;  Surgeon: Jerrell Belfast, MD;  Location: [facility];  Service: ENT;  Laterality: Bilateral;  . NASAL SEPTUM SURGERY  03/04/2017  . TONSILLECTOMY AND ADENOIDECTOMY Bilateral 03/04/2017   Procedure: TONSILLECTOMY;  Surgeon: Jerrell Belfast, MD;  Location: [facility];  Service: ENT;  Laterality: Bilateral;    SOCIAL HISTORY: Social History   Tobacco Use  . Smoking status: Never Smoker  . Smokeless tobacco: Never Used  Substance Use Topics  . Alcohol use: Yes    Alcohol/week: 0.0 standard drinks    Comment: socially  . Drug use: No    FAMILY HISTORY: Family History  Problem Relation Age of Onset  .  Hyperlipidemia Father   . Hypertension Father   . Heart disease Father   . Sudden death Father        Age 8  . Other Father        BPES  . Heart attack Father   . Other Other        BPES  . Other Other        BPES  . Hyperlipidemia Mother   . Hypertension Mother   . Diabetes Mother   . Anxiety disorder Mother   . Obesity Mother   . Other Brother        BPES  . Other Sister        BPES  . Other Paternal Uncle        BPES  . Other Cousin        BPES  . Other Cousin        BPES  . Heart attack Maternal Grandmother   . Heart disease  Maternal Grandfather   . Heart attack Maternal Grandfather   . Lung cancer Maternal Grandfather   . Heart disease Paternal Grandfather heartr attack  . Heart attack Paternal Grandfather 3       MI  . Sudden death Paternal Grandfather     ROS: Review of Systems  Constitutional: Positive for weight loss.  Skin: Positive for itching and rash.  Psychiatric/Behavioral: Positive for depression.    PHYSICAL EXAM: Blood pressure 138/80, pulse 63, temperature 98.2 F (36.8 C), temperature source Oral, height 5\' 8"  (1.727 m), weight (!) 334 lb (151.5 kg), SpO2 100 %. Body mass index is 50.78 kg/m. Physical Exam  Constitutional: [name] is oriented to person, place, and time. [name] appears well-developed and well-nourished.  Cardiovascular: Normal rate.  Pulmonary/Chest: Effort normal.  Musculoskeletal: Normal range of motion.  Neurological: [name] is oriented to person, place, and time.  Skin: Skin is warm and dry.  Psychiatric: [name] has a normal mood and affect. Her behavior is normal.  Vitals reviewed.   RECENT LABS AND TESTS: BMET    Component Value Date/Time   NA 142 02/16/2018 1133   K 4.5 02/16/2018 1133   CL 104 02/16/2018 1133   CO2 23 02/16/2018 1133   GLUCOSE 87 02/16/2018 1133   GLUCOSE 89 05/25/2017 0843   BUN 14 02/16/2018 1133   CREATININE 0.62 02/16/2018 1133   CALCIUM 9.3 02/16/2018 1133   GFRNONAA 121 02/16/2018 1133   GFRAA 140 02/16/2018 1133   Lab Results  Component Value Date   HGBA1C 5.3 02/16/2018   HGBA1C 5.5 11/02/2017   HGBA1C 5.7 05/25/2017   HGBA1C 5.1 11/29/2016   HGBA1C 5.8 06/18/2016   Lab Results  Component Value Date   INSULIN 16.5 02/16/2018   INSULIN 35.6 (H) 11/02/2017   CBC    Component Value Date/Time   WBC 8.4 11/02/2017 0904   WBC 5.9 05/25/2017 0843   RBC 4.39 11/02/2017 0904   RBC 4.68 05/25/2017 0843   HGB 11.8 11/02/2017 0904   HCT 36.7 11/02/2017 0904   PLT 243.0 05/25/2017 0843   MCV 84 11/02/2017 0904   MCH 26.9  11/02/2017 0904   MCH 27.0 03/04/2017 1157   MCHC 32.2 11/02/2017 0904   MCHC 31.8 05/25/2017 0843   RDW 15.1 11/02/2017 0904   LYMPHSABS 2.4 11/02/2017 0904   MONOABS 0.6 05/25/2017 0843   EOSABS 0.3 11/02/2017 0904   BASOSABS 0.0 11/02/2017 0904   Iron/TIBC/Ferritin/ %Sat    Component Value Date/Time   IRON 30 11/02/2017  0904   TIBC 337 11/02/2017 0904   FERRITIN 28 11/02/2017 0904   IRONPCTSAT 9 (LL) 11/02/2017 0904   Lipid Panel     Component Value Date/Time   CHOL 191 02/16/2018 1133   TRIG 137 02/16/2018 1133   HDL 41 02/16/2018 1133   CHOLHDL 3 05/25/2017 0843   VLDL 20.6 05/25/2017 0843   LDLCALC 123 (H) 02/16/2018 1133   Hepatic Function Panel     Component Value Date/Time   PROT 6.6 02/16/2018 1133   ALBUMIN 4.1 02/16/2018 1133   AST 10 02/16/2018 1133   ALT 11 02/16/2018 1133   ALKPHOS 69 02/16/2018 1133   BILITOT 0.3 02/16/2018 1133      Component Value Date/Time   TSH 2.700 11/02/2017 0904   TSH 1.97 05/25/2017 0843   TSH 1.58 06/18/2016 0952   Results for [name], [name] (MRN [id number]) as of 03/29/2018 10:36  Ref. Range 02/16/2018 11:33  Vitamin D, 25-Hydroxy Latest Ref Range: 30.0 - 100.0 ng/mL 60.9    ASSESSMENT AND PLAN: Contact dermatitis due to other agent, unspecified contact dermatitis type  Other depression - with emotional eating - Plan: buPROPion (WELLBUTRIN SR) 150 MG 12 hr tablet  At risk for heart disease  Class 3 severe obesity with serious comorbidity and body mass index (BMI) of 50.0 to 59.9 in adult, unspecified obesity type ([name])  PLAN:  Contact Dermatitis [name] was advised to take Zyrtec in the morning and 25mg  of Benadryl at night and apply OTC hydrocortisone to the affected are when the patch is removed. [name] was told that this may not help and that [name] may have to look at other options if there is no improvement.  Cardiovascular risk counseling [name] was given extended (15 minutes) coronary artery disease  prevention counseling today. [name] is 32 y.o. female and has risk factors for heart disease including obesity. We discussed intensive lifestyle modifications today with an emphasis on specific weight loss instructions and strategies. [name] was also informed of the importance of increasing exercise and decreasing saturated fats to help prevent heart disease.  Depression with Emotional Eating Behaviors We discussed behavior modification techniques today to help [name] deal with her emotional eating and depression. [name] has agreed to continue to take Wellbutrin SR 150 mg qd and agreed to follow up as directed in 3 weeks.  Obesity [name] is currently in the action stage of change. As such, her goal is to continue with weight loss efforts. [name] has agreed to follow the Category 4 plan with lunch options. [name] has been instructed to start cardio for 15 minutes 5 times a week and work up to a goal of 150 minutes of combined cardio and strengthening exercise per week for weight loss and overall health benefits. We discussed the following Behavioral Modification Strategies today: increasing lean protein intake and decreasing simple carbohydrates.   [name] has agreed to follow up with our clinic in 3 weeks. [name] was informed of the importance of frequent follow up visits to maximize her success with intensive lifestyle modifications for her multiple health conditions.   OBESITY BEHAVIORAL INTERVENTION VISIT  Today's visit was # 8   Starting weight: 363 lbs Starting date: 11/02/17 Today's weight : Weight: (!) 334 lb (151.5 kg)  Today's date: 03/28/2018 Total lbs lost to date: 20  ASK: We discussed the diagnosis of obesity with [name] today and [name] agreed to give [place] permission to discuss obesity behavioral modification therapy today.  ASSESS: [name] has the diagnosis of  obesity and her BMI today is 50.8. [name] is in the action stage of change.   ADVISE: [name] was educated on the  multiple health risks of obesity as well as the benefit of weight loss to improve her health. [name] was advised of the need for long term treatment and the importance of lifestyle modifications to improve her current health and to decrease her risk of future health problems.  AGREE: Multiple dietary modification options and treatment options were discussed and [name] agreed to follow the recommendations documented in the above note.  ARRANGE: [name] was educated on the importance of frequent visits to treat obesity as outlined per CMS and USPSTF guidelines and agreed to schedule her next follow up appointment today.  I, [name], am acting as transcriptionist for Starlyn Skeans, MD  I have reviewed the above documentation for accuracy and completeness, and I agree with the above. -Dennard Nip, MD

## 2018-04-19 ENCOUNTER — Ambulatory Visit (INDEPENDENT_AMBULATORY_CARE_PROVIDER_SITE_OTHER): Payer: 59 | Admitting: Family Medicine

## 2018-04-19 VITALS — BP 132/85 | HR 70 | Temp 97.9°F | Ht 68.0 in | Wt 337.0 lb

## 2018-04-19 DIAGNOSIS — R7303 Prediabetes: Secondary | ICD-10-CM | POA: Diagnosis not present

## 2018-04-19 DIAGNOSIS — Z9189 Other specified personal risk factors, not elsewhere classified: Secondary | ICD-10-CM | POA: Diagnosis not present

## 2018-04-19 DIAGNOSIS — Z6841 Body Mass Index (BMI) 40.0 and over, adult: Secondary | ICD-10-CM | POA: Diagnosis not present

## 2018-04-19 DIAGNOSIS — F3289 Other specified depressive episodes: Secondary | ICD-10-CM | POA: Diagnosis not present

## 2018-04-19 MED ORDER — BUPROPION HCL ER (SR) 150 MG PO TB12: 150.0000 mg | ORAL_TABLET | Freq: Every day | ORAL | 0 refills | Status: DC

## 2018-04-20 NOTE — Progress Notes (Signed)
Office: [phone]  /  Fax: [phone]   HPI:   Chief Complaint: OBESITY [name] is here to discuss her progress with her obesity treatment plan. She is on the Category 4 plan with lunch options and is following her eating plan approximately 75 % of the time. She states she is doing cardio 30 minutes 2 to 3 times per week. [name] is retaining fluid today and she has increased eating out and it is likely that her sodium has also increased. Her hunger is controlled and she is doing well with managing temptations.  Her weight is (!) 337 lb (152.9 kg) today and has had a weight gain of 3 pounds over a period of 3 weeks since her last visit. She has lost 26 lbs since starting treatment with [place].  Depression with emotional eating behaviors [name] is struggling with emotional eating and using food for comfort to the extent that it is negatively impacting her health. Her mood is stable on Wellbutrin and she is doing well with decreased emotional eating. She denies insomnia. Her blood pressure is stable, but has increased slightly since starting medication.  Pre-Diabetes [name] has a diagnosis of pre-diabetes based on her elevated Hgb A1c and was informed this puts her at greater risk of developing diabetes. She is taking metformin and working on her diet, but she still notes polyphagia if her simple carbs are increased. She denies nausea, vomiting, or hypoglycemia.  At risk for cardiovascular disease [name] is at a higher than average risk for cardiovascular disease due to pre-diabetes and obesity.   ALLERGIES: No Known Allergies  MEDICATIONS: Current Outpatient Medications on File Prior to Visit  Medication Sig Dispense Refill  . Cholecalciferol 5000 units capsule Take 1 capsule (5,000 Units total) by mouth daily. With meal 30 capsule 11  . Cyanocobalamin 2500 MCG SUBL Place 2,500 mcg under the tongue daily.     [name] estradiol (CLIMARA - DOSED IN MG/24 HR) 0.1 mg/24hr patch Place 0.1 mg  onto the skin. Apply and change patch twice weekly    . metFORMIN (GLUCOPHAGE-XR) 500 MG 24 hr tablet Take 3 tablets (1,500 mg total) by mouth daily. 270 tablet 1  . Multiple Vitamin (MULTIVITAMIN) capsule Take 1 capsule by mouth daily.     No current facility-administered medications on file prior to visit.     PAST MEDICAL HISTORY: Past Medical History:  Diagnosis Date  . Anemia   . Back pain   . BPES syndrome   . Enlarged thyroid   . Infertility, female   . Obesity   . Palpitations   . Palpitations   . PCOS (polycystic ovarian syndrome)   . PONV (postoperative nausea and vomiting)   . Prediabetes   . Premature ovarian failure   . Vitamin B 12 deficiency   . Vitamin D deficiency   . Wears eyeglasses     PAST SURGICAL HISTORY: Past Surgical History:  Procedure Laterality Date  . EYE SURGERY Bilateral   . NASAL SEPTOPLASTY W/ TURBINOPLASTY Bilateral 03/04/2017   Procedure: NASAL SEPTOPLASTY WITH BILATERAL INFERIOR TURBINATE REDUCTION;  Surgeon: Jerrell Belfast, MD;  Location: [facility];  Service: ENT;  Laterality: Bilateral;  . NASAL SEPTUM SURGERY  03/04/2017  . TONSILLECTOMY AND ADENOIDECTOMY Bilateral 03/04/2017   Procedure: TONSILLECTOMY;  Surgeon: Jerrell Belfast, MD;  Location: [facility];  Service: ENT;  Laterality: Bilateral;    SOCIAL HISTORY: Social History   Tobacco Use  . Smoking status: Never Smoker  . Smokeless tobacco: Never Used  Substance Use  Topics  . Alcohol use: Yes    Alcohol/week: 0.0 standard drinks    Comment: socially  . Drug use: No    FAMILY HISTORY: Family History  Problem Relation Age of Onset  . Hyperlipidemia Father   . Hypertension Father   . Heart disease Father   . Sudden death Father        Age 48  . Other Father        BPES  . Heart attack Father   . Other Other        BPES  . Other Other        BPES  . Hyperlipidemia Mother   . Hypertension Mother   . Diabetes Mother   . Anxiety disorder Mother   . Obesity Mother   .  Other Brother        BPES  . Other Sister        BPES  . Other Paternal Uncle        BPES  . Other Cousin        BPES  . Other Cousin        BPES  . Heart attack Maternal Grandmother   . Heart disease Maternal Grandfather   . Heart attack Maternal Grandfather   . Lung cancer Maternal Grandfather   . Heart disease Paternal Grandfather heartr attack  . Heart attack Paternal Grandfather 60       MI  . Sudden death Paternal Grandfather     ROS: Review of Systems  Constitutional: Negative for weight loss.  Gastrointestinal: Negative for nausea and vomiting.  Endo/Heme/Allergies:       Positive for polyphagia. Negative for hypoglycemia.  Psychiatric/Behavioral: Positive for depression. The patient does not have insomnia.     PHYSICAL EXAM: Blood pressure 132/85, pulse 70, temperature 97.9 F (36.6 C), temperature source Oral, height 5\' 8"  (1.727 m), weight (!) 337 lb (152.9 kg), SpO2 98 %. Body mass index is 51.24 kg/m. Physical Exam  Constitutional: She is oriented to person, place, and time. She appears well-developed and well-nourished.  Cardiovascular: Normal rate.  Pulmonary/Chest: Effort normal.  Musculoskeletal: Normal range of motion.  Neurological: She is oriented to person, place, and time.  Skin: Skin is warm and dry.  Psychiatric: She has a normal mood and affect. Her behavior is normal.  Vitals reviewed.   RECENT LABS AND TESTS: BMET    Component Value Date/Time   NA 142 02/16/2018 1133   K 4.5 02/16/2018 1133   CL 104 02/16/2018 1133   CO2 23 02/16/2018 1133   GLUCOSE 87 02/16/2018 1133   GLUCOSE 89 05/25/2017 0843   BUN 14 02/16/2018 1133   CREATININE 0.62 02/16/2018 1133   CALCIUM 9.3 02/16/2018 1133   GFRNONAA 121 02/16/2018 1133   GFRAA 140 02/16/2018 1133   Lab Results  Component Value Date   HGBA1C 5.3 02/16/2018   HGBA1C 5.5 11/02/2017   HGBA1C 5.7 05/25/2017   HGBA1C 5.1 11/29/2016   HGBA1C 5.8 06/18/2016   Lab Results  Component  Value Date   INSULIN 16.5 02/16/2018   INSULIN 35.6 (H) 11/02/2017   CBC    Component Value Date/Time   WBC 8.4 11/02/2017 0904   WBC 5.9 05/25/2017 0843   RBC 4.39 11/02/2017 0904   RBC 4.68 05/25/2017 0843   HGB 11.8 11/02/2017 0904   HCT 36.7 11/02/2017 0904   PLT 243.0 05/25/2017 0843   MCV 84 11/02/2017 0904   MCH 26.9 11/02/2017 0904   MCH 27.0  03/04/2017 1157   MCHC 32.2 11/02/2017 0904   MCHC 31.8 05/25/2017 0843   RDW 15.1 11/02/2017 0904   LYMPHSABS 2.4 11/02/2017 0904   MONOABS 0.6 05/25/2017 0843   EOSABS 0.3 11/02/2017 0904   BASOSABS 0.0 11/02/2017 0904   Iron/TIBC/Ferritin/ %Sat    Component Value Date/Time   IRON 30 11/02/2017 0904   TIBC 337 11/02/2017 0904   FERRITIN 28 11/02/2017 0904   IRONPCTSAT 9 (LL) 11/02/2017 0904   Lipid Panel     Component Value Date/Time   CHOL 191 02/16/2018 1133   TRIG 137 02/16/2018 1133   HDL 41 02/16/2018 1133   CHOLHDL 3 05/25/2017 0843   VLDL 20.6 05/25/2017 0843   LDLCALC 123 (H) 02/16/2018 1133   Hepatic Function Panel     Component Value Date/Time   PROT 6.6 02/16/2018 1133   ALBUMIN 4.1 02/16/2018 1133   AST 10 02/16/2018 1133   ALT 11 02/16/2018 1133   ALKPHOS 69 02/16/2018 1133   BILITOT 0.3 02/16/2018 1133      Component Value Date/Time   TSH 2.700 11/02/2017 0904   TSH 1.97 05/25/2017 0843   TSH 1.58 06/18/2016 0952   Results for [name], [name] (MRN [id number]) as of 04/20/2018 06:13  Ref. Range 02/16/2018 11:33  Vitamin D, 25-Hydroxy Latest Ref Range: 30.0 - 100.0 ng/mL 60.9   ASSESSMENT AND PLAN: Prediabetes  Other depression - with emotional eating - Plan: buPROPion (WELLBUTRIN SR) 150 MG 12 hr tablet  At risk for heart disease  Class 3 severe obesity with serious comorbidity and body mass index (BMI) of 50.0 to 59.9 in adult, unspecified obesity type ([place])  PLAN:  Pre-Diabetes Kadin will continue to work on weight loss, exercise, and decreasing simple carbohydrates in her diet  to help decrease the risk of diabetes.She was informed that eating too many simple carbohydrates or too many calories at one sitting increases the likelihood of GI side effects. Krupa agrees to continue with her diet and metformin and a prescription was not written today. We will recheck labs in 2 months. Ronnika agreed to follow up with [place] as directed to monitor her progress in 3 weeks.  Cardiovascular risk counseling [name] was given extended (15 minutes) coronary artery disease prevention counseling today. She is 32 y.o. female and has risk factors for heart disease including pre-diabetes and obesity. We discussed intensive lifestyle modifications today with an emphasis on specific weight loss instructions and strategies. Pt was also informed of the importance of increasing exercise and decreasing saturated fats to help prevent heart disease.  Depression with Emotional Eating Behaviors We discussed behavior modification techniques today to help [name] deal with her emotional eating and depression. She has agreed to continue to take Wellbutrin SR 150 mg qd and agreed to follow up as directed in 3 weeks.  Obesity [name] is currently in the action stage of change. As such, her goal is to continue with weight loss efforts. She has agreed to follow the Category 4 plan. [name] has been instructed to work up to a goal of 150 minutes of combined cardio and strengthening exercise per week for weight loss and overall health benefits. We discussed the following Behavioral Modification Strategies today: increasing lean protein intake, decreasing simple carbohydrates, and holiday eating strategies.   [name] has agreed to follow up with our clinic in 3 weeks. She was informed of the importance of frequent follow up visits to maximize her success with intensive lifestyle modifications for her multiple health conditions.  OBESITY BEHAVIORAL INTERVENTION VISIT  Today's visit was # 9   Starting  weight: 363 Starting date: 11/02/17 Today's weight : Weight: (!) 337 lb (152.9 kg)  Today's date: 04/19/2018 Total lbs lost to date: 39  ASK: We discussed the diagnosis of obesity with [name] Cancel today and [name] agreed to give [place] permission to discuss obesity behavioral modification therapy today.  ASSESS: [name] has the diagnosis of obesity and her BMI today is 51.25. [name] is in the action stage of change.   ADVISE: [name] was educated on the multiple health risks of obesity as well as the benefit of weight loss to improve her health. She was advised of the need for long term treatment and the importance of lifestyle modifications to improve her current health and to decrease her risk of future health problems.  AGREE: Multiple dietary modification options and treatment options were discussed and [name] agreed to follow the recommendations documented in the above note.  ARRANGE: [name] was educated on the importance of frequent visits to treat obesity as outlined per CMS and USPSTF guidelines and agreed to schedule her next follow up appointment today.  I, Marcille Blanco, am acting as transcriptionist for Starlyn Skeans, MD  I have reviewed the above documentation for accuracy and completeness, and I agree with the above. -Dennard Nip, MD

## 2018-04-24 ENCOUNTER — Ambulatory Visit: Payer: 59 | Admitting: Psychology

## 2018-04-24 DIAGNOSIS — F411 Generalized anxiety disorder: Secondary | ICD-10-CM

## 2018-04-24 DIAGNOSIS — F4322 Adjustment disorder with anxiety: Secondary | ICD-10-CM | POA: Diagnosis not present

## 2018-05-09 ENCOUNTER — Ambulatory Visit (INDEPENDENT_AMBULATORY_CARE_PROVIDER_SITE_OTHER): Payer: 59 | Admitting: Family Medicine

## 2018-05-09 VITALS — BP 124/81 | HR 72 | Temp 98.2°F | Ht 68.0 in | Wt 334.0 lb

## 2018-05-09 DIAGNOSIS — F3289 Other specified depressive episodes: Secondary | ICD-10-CM

## 2018-05-09 DIAGNOSIS — E559 Vitamin D deficiency, unspecified: Secondary | ICD-10-CM

## 2018-05-09 DIAGNOSIS — Z6841 Body Mass Index (BMI) 40.0 and over, adult: Secondary | ICD-10-CM

## 2018-05-09 DIAGNOSIS — Z9189 Other specified personal risk factors, not elsewhere classified: Secondary | ICD-10-CM

## 2018-05-09 MED ORDER — BUPROPION HCL ER (SR) 150 MG PO TB12: 150.0000 mg | ORAL_TABLET | Freq: Every day | ORAL | 0 refills | Status: DC

## 2018-05-10 NOTE — Progress Notes (Signed)
Office: [phone]  /  Fax: [phone]   HPI:   Chief Complaint: OBESITY [name] is here to discuss her progress with her obesity treatment plan. She is on the Category 4 plan and is following her eating plan approximately 75 % of the time. She states she is doing cardio 30 minutes 2 to 3 times per week. [name] continues to do well with weight loss on her Category 4 plan, but she is worried about the holidays and social eating pressures. Her weight is (!) 334 lb (151.5 kg) today and has had a weight loss of 3 pounds over a period of 3 weeks since her last visit. She has lost 29 lbs since starting treatment with [place].  Vitamin D deficiency [name] has a diagnosis of vitamin D deficiency. She is stable on vit D and denies nausea, vomiting or muscle weakness.  At risk for cardiovascular disease [name] is at a higher than average risk for cardiovascular disease due to obesity. She currently denies any chest pain.  Depression with emotional eating behaviors [name] is doing better with controlling her emotional eating. [name] struggles with emotional eating and using food for comfort to the extent that it is negatively impacting her health. She often snacks when she is not hungry. [name] sometimes feels she is out of control and then feels guilty that she made poor food choices. She has been working on behavior modification techniques to help reduce her emotional eating and has been somewhat successful. Her mood is stable on Wellbutrin and she denies any insomnia. Her blood pressure is stable and she shows no sign of suicidal or homicidal ideations.  Depression screen [facility] 2/9 11/02/2017 05/25/2017 06/18/2016 06/28/2014  Decreased Interest 3 0 0 0  Down, Depressed, Hopeless 2 0 0 0  PHQ - 2 Score 5 0 0 0  Altered sleeping 2 - - -  Tired, decreased energy 3 - - -  Change in appetite 3 - - -  Feeling bad or failure about yourself  3 - - -  Trouble concentrating 1 - - -  Moving slowly or  fidgety/restless 1 - - -  Suicidal thoughts 0 - - -  PHQ-9 Score 18 - - -  Difficult doing work/chores Somewhat difficult - - -      ALLERGIES: No Known Allergies  MEDICATIONS: Current Outpatient Medications on File Prior to Visit  Medication Sig Dispense Refill  . Cholecalciferol 5000 units capsule Take 1 capsule (5,000 Units total) by mouth daily. With meal 30 capsule 11  . Cyanocobalamin 2500 MCG SUBL Place 2,500 mcg under the tongue daily.     [name] estradiol (CLIMARA - DOSED IN MG/24 HR) 0.1 mg/24hr patch Place 0.1 mg onto the skin. Apply and change patch twice weekly    . metFORMIN (GLUCOPHAGE-XR) 500 MG 24 hr tablet Take 3 tablets (1,500 mg total) by mouth daily. 270 tablet 1  . Multiple Vitamin (MULTIVITAMIN) capsule Take 1 capsule by mouth daily.     No current facility-administered medications on file prior to visit.     PAST MEDICAL HISTORY: Past Medical History:  Diagnosis Date  . Anemia   . Back pain   . BPES syndrome   . Enlarged thyroid   . Infertility, female   . Obesity   . Palpitations   . Palpitations   . PCOS (polycystic ovarian syndrome)   . PONV (postoperative nausea and vomiting)   . Prediabetes   . Premature ovarian failure   . Vitamin B 12 deficiency   .  Vitamin D deficiency   . Wears eyeglasses     PAST SURGICAL HISTORY: Past Surgical History:  Procedure Laterality Date  . EYE SURGERY Bilateral   . NASAL SEPTOPLASTY W/ TURBINOPLASTY Bilateral 03/04/2017   Procedure: NASAL SEPTOPLASTY WITH BILATERAL INFERIOR TURBINATE REDUCTION;  Surgeon: Jerrell Belfast, MD;  Location: [place];  Service: ENT;  Laterality: Bilateral;  . NASAL SEPTUM SURGERY  03/04/2017  . TONSILLECTOMY AND ADENOIDECTOMY Bilateral 03/04/2017   Procedure: TONSILLECTOMY;  Surgeon: Jerrell Belfast, MD;  Location: [place];  Service: ENT;  Laterality: Bilateral;    SOCIAL HISTORY: Social History   Tobacco Use  . Smoking status: Never Smoker  . Smokeless tobacco: Never Used    Substance Use Topics  . Alcohol use: Yes    Alcohol/week: 0.0 standard drinks    Comment: socially  . Drug use: No    FAMILY HISTORY: Family History  Problem Relation Age of Onset  . Hyperlipidemia Father   . Hypertension Father   . Heart disease Father   . Sudden death Father        Age 34  . Other Father        BPES  . Heart attack Father   . Other Other        BPES  . Other Other        BPES  . Hyperlipidemia Mother   . Hypertension Mother   . Diabetes Mother   . Anxiety disorder Mother   . Obesity Mother   . Other Brother        BPES  . Other Sister        BPES  . Other Paternal Uncle        BPES  . Other Cousin        BPES  . Other Cousin        BPES  . Heart attack Maternal Grandmother   . Heart disease Maternal Grandfather   . Heart attack Maternal Grandfather   . Lung cancer Maternal Grandfather   . Heart disease Paternal Grandfather heartr attack  . Heart attack Paternal Grandfather 79       MI  . Sudden death Paternal Grandfather     ROS: Review of Systems  Constitutional: Positive for weight loss.  Cardiovascular: Negative for chest pain.  Gastrointestinal: Negative for nausea and vomiting.  Musculoskeletal:       Negative for muscle weakness  Psychiatric/Behavioral: Positive for depression. Negative for suicidal ideas. The patient does not have insomnia.     PHYSICAL EXAM: Blood pressure 124/81, pulse 72, temperature 98.2 F (36.8 C), temperature source Oral, height 5\' 8"  (1.727 m), weight (!) 334 lb (151.5 kg), last menstrual period 04/14/2018, SpO2 100 %. Body mass index is 50.78 kg/m. Physical Exam  Constitutional: She is oriented to person, place, and time. She appears well-developed and well-nourished.  Cardiovascular: Normal rate.  Pulmonary/Chest: Effort normal.  Musculoskeletal: Normal range of motion.  Neurological: She is oriented to person, place, and time.  Skin: Skin is warm and dry.  Psychiatric: She has a normal mood  and affect. Her behavior is normal. She expresses no homicidal and no suicidal ideation.  Vitals reviewed.   RECENT LABS AND TESTS: BMET    Component Value Date/Time   NA 142 02/16/2018 1133   K 4.5 02/16/2018 1133   CL 104 02/16/2018 1133   CO2 23 02/16/2018 1133   GLUCOSE 87 02/16/2018 1133   GLUCOSE 89 05/25/2017 0843   BUN 14 02/16/2018 1133  CREATININE 0.62 02/16/2018 1133   CALCIUM 9.3 02/16/2018 1133   GFRNONAA 121 02/16/2018 1133   GFRAA 140 02/16/2018 1133   Lab Results  Component Value Date   HGBA1C 5.3 02/16/2018   HGBA1C 5.5 11/02/2017   HGBA1C 5.7 05/25/2017   HGBA1C 5.1 11/29/2016   HGBA1C 5.8 06/18/2016   Lab Results  Component Value Date   INSULIN 16.5 02/16/2018   INSULIN 35.6 (H) 11/02/2017   CBC    Component Value Date/Time   WBC 8.4 11/02/2017 0904   WBC 5.9 05/25/2017 0843   RBC 4.39 11/02/2017 0904   RBC 4.68 05/25/2017 0843   HGB 11.8 11/02/2017 0904   HCT 36.7 11/02/2017 0904   PLT 243.0 05/25/2017 0843   MCV 84 11/02/2017 0904   MCH 26.9 11/02/2017 0904   MCH 27.0 03/04/2017 1157   MCHC 32.2 11/02/2017 0904   MCHC 31.8 05/25/2017 0843   RDW 15.1 11/02/2017 0904   LYMPHSABS 2.4 11/02/2017 0904   MONOABS 0.6 05/25/2017 0843   EOSABS 0.3 11/02/2017 0904   BASOSABS 0.0 11/02/2017 0904   Iron/TIBC/Ferritin/ %Sat    Component Value Date/Time   IRON 30 11/02/2017 0904   TIBC 337 11/02/2017 0904   FERRITIN 28 11/02/2017 0904   IRONPCTSAT 9 (LL) 11/02/2017 0904   Lipid Panel     Component Value Date/Time   CHOL 191 02/16/2018 1133   TRIG 137 02/16/2018 1133   HDL 41 02/16/2018 1133   CHOLHDL 3 05/25/2017 0843   VLDL 20.6 05/25/2017 0843   LDLCALC 123 (H) 02/16/2018 1133   Hepatic Function Panel     Component Value Date/Time   PROT 6.6 02/16/2018 1133   ALBUMIN 4.1 02/16/2018 1133   AST 10 02/16/2018 1133   ALT 11 02/16/2018 1133   ALKPHOS 69 02/16/2018 1133   BILITOT 0.3 02/16/2018 1133      Component Value Date/Time    TSH 2.700 11/02/2017 0904   TSH 1.97 05/25/2017 0843   TSH 1.58 06/18/2016 0952   Results for BREEONNA, MONE (MRN [id number]) as of 05/10/2018 14:25  Ref. Range 02/16/2018 11:33  Vitamin D, 25-Hydroxy Latest Ref Range: 30.0 - 100.0 ng/mL 60.9   ASSESSMENT AND PLAN: Vitamin D deficiency  Other depression - with emotional eating  - Plan: buPROPion (WELLBUTRIN SR) 150 MG 12 hr tablet  At risk for heart disease  Class 3 severe obesity with serious comorbidity and body mass index (BMI) of 50.0 to 59.9 in adult, unspecified obesity type ([place])  PLAN:  Vitamin D Deficiency Elorah was informed that low vitamin D levels contributes to fatigue and are associated with obesity, breast, and colon cancer. She agrees to continue to take prescription Vit D @50 ,000 IU every week and will follow up for routine testing of vitamin D, at least 2-3 times per year. She was informed of the risk of over-replacement of vitamin D and agrees to not increase her dose unless she discusses this with [place] first. We will recheck labs in 4 to 6 weeks and Misa will follow up at the agreed upon time.  Cardiovascular risk counseling Brycelynn was given extended (15 minutes) coronary artery disease prevention counseling today. She is 32 y.o. female and has risk factors for heart disease including obesity. We discussed intensive lifestyle modifications today with an emphasis on specific weight loss instructions and strategies. Pt was also informed of the importance of increasing exercise and decreasing saturated fats to help prevent heart disease.  Depression with Emotional Eating Behaviors We discussed  behavior modification techniques today to help Amra deal with her emotional eating and depression. She has agreed to continue Wellbutrin SR 150 mg qd #30 with no refills and follow up as directed.  Obesity Brietta is currently in the action stage of change. As such, her goal is to continue with weight loss efforts She  has agreed to follow the Category 4 plan Tammi has been instructed to work up to a goal of 150 minutes of combined cardio and strengthening exercise per week for weight loss and overall health benefits. We discussed the following Behavioral Modification Strategies today: increasing lean protein intake, decreasing simple carbohydrates, work on meal planning and easy cooking plans, holiday eating strategies  and emotional eating strategies  Chavon has agreed to follow up with our clinic in 3 weeks. She was informed of the importance of frequent follow up visits to maximize her success with intensive lifestyle modifications for her multiple health conditions.   OBESITY BEHAVIORAL INTERVENTION VISIT  Today's visit was # 10   Starting weight: 363 lbs Starting date: 11/02/17 Today's weight : 334 lbs  Today's date: 05/09/2018 Total lbs lost to date: 71   ASK: We discussed the diagnosis of obesity with Johnsie Cancel today and [place] agreed to give [place] permission to discuss obesity behavioral modification therapy today.  ASSESS: Naasia has the diagnosis of obesity and her BMI today is 50.8 Gary is in the action stage of change   ADVISE: Anwen was educated on the multiple health risks of obesity as well as the benefit of weight loss to improve her health. She was advised of the need for long term treatment and the importance of lifestyle modifications to improve her current health and to decrease her risk of future health problems.  AGREE: Multiple dietary modification options and treatment options were discussed and  Annabeth agreed to follow the recommendations documented in the above note.  ARRANGE: [name] was educated on the importance of frequent visits to treat obesity as outlined per CMS and USPSTF guidelines and agreed to schedule her next follow up appointment today.  I, Doreene Nest, am acting as transcriptionist for Dennard Nip, MD  I have reviewed the above  documentation for accuracy and completeness, and I agree with the above. -Dennard Nip, MD

## 2018-05-23 ENCOUNTER — Ambulatory Visit (INDEPENDENT_AMBULATORY_CARE_PROVIDER_SITE_OTHER): Payer: 59 | Admitting: Psychology

## 2018-05-23 DIAGNOSIS — F411 Generalized anxiety disorder: Secondary | ICD-10-CM

## 2018-05-26 ENCOUNTER — Encounter: Payer: Self-pay | Admitting: Family Medicine

## 2018-05-26 ENCOUNTER — Ambulatory Visit: Payer: Self-pay | Admitting: Family Medicine

## 2018-05-26 ENCOUNTER — Other Ambulatory Visit: Payer: Self-pay

## 2018-05-26 ENCOUNTER — Ambulatory Visit: Payer: 59 | Admitting: Family Medicine

## 2018-05-26 VITALS — BP 120/79 | HR 63 | Temp 97.4°F | Resp 14 | Ht 68.0 in | Wt 339.0 lb

## 2018-05-26 DIAGNOSIS — Q103 Other congenital malformations of eyelid: Secondary | ICD-10-CM | POA: Diagnosis not present

## 2018-05-26 DIAGNOSIS — R7303 Prediabetes: Secondary | ICD-10-CM | POA: Diagnosis not present

## 2018-05-26 DIAGNOSIS — E282 Polycystic ovarian syndrome: Secondary | ICD-10-CM

## 2018-05-26 MED ORDER — METFORMIN HCL ER 500 MG PO TB24: 1500.0000 mg | ORAL_TABLET | Freq: Every day | ORAL | 1 refills | Status: DC

## 2018-05-26 NOTE — Progress Notes (Signed)
[name] , 1985-09-18, 32 y.o., female MRN: [id number] Patient Care Team    Relationship Specialty Notifications Start End  Ma Hillock, DO PCP - General Family Medicine  04/25/15   Servando Salina, MD Consulting Physician Obstetrics and Gynecology  06/18/16   Wellington Hampshire, MD Consulting Physician Cardiology  06/18/16   Starlyn Skeans, MD Consulting Physician Family Medicine  05/26/18     Chief Complaint  Patient presents with  . prediabetes     Subjective:   BMI 50.0-59.9, adult (HCC)/prediabetes/morbid obesity/PCOS She is doing well. Reports compliance with  metformin 1500 mg QD. She is prescribed estrogen patches through gyn and using progesterone.  She has been dieting and exercising. She is following with Dr. Trixie Rude for her obesity.  She is doing great.   Depression screen [facility] 2/9 11/02/2017 05/25/2017 06/18/2016 06/28/2014  Decreased Interest 3 0 0 0  Down, Depressed, Hopeless 2 0 0 0  PHQ - 2 Score 5 0 0 0  Altered sleeping 2 - - -  Tired, decreased energy 3 - - -  Change in appetite 3 - - -  Feeling bad or failure about yourself  3 - - -  Trouble concentrating 1 - - -  Moving slowly or fidgety/restless 1 - - -  Suicidal thoughts 0 - - -  PHQ-9 Score 18 - - -  Difficult doing work/chores Somewhat difficult - - -    No Known Allergies Social History   Tobacco Use  . Smoking status: Never Smoker  . Smokeless tobacco: Never Used  Substance Use Topics  . Alcohol use: Yes    Alcohol/week: 0.0 standard drinks    Comment: socially   Past Medical History:  Diagnosis Date  . Anemia   . Back pain   . BPES syndrome   . Enlarged thyroid   . Infertility, female   . Obesity   . Palpitations   . Palpitations   . PCOS (polycystic ovarian syndrome)   . PONV (postoperative nausea and vomiting)   . Prediabetes   . Premature ovarian failure   . Vitamin B 12 deficiency   . Vitamin D deficiency   . Wears eyeglasses    Past Surgical History:    Procedure Laterality Date  . EYE SURGERY Bilateral   . NASAL SEPTOPLASTY W/ TURBINOPLASTY Bilateral 03/04/2017   Procedure: NASAL SEPTOPLASTY WITH BILATERAL INFERIOR TURBINATE REDUCTION;  Surgeon: Jerrell Belfast, MD;  Location: [place];  Service: ENT;  Laterality: Bilateral;  . NASAL SEPTUM SURGERY  03/04/2017  . TONSILLECTOMY AND ADENOIDECTOMY Bilateral 03/04/2017   Procedure: TONSILLECTOMY;  Surgeon: Jerrell Belfast, MD;  Location: [facility] OR;  Service: ENT;  Laterality: Bilateral;   Family History  Problem Relation Age of Onset  . Hyperlipidemia Father   . Hypertension Father   . Heart disease Father   . Sudden death Father        Age 62  . Other Father        BPES  . Heart attack Father   . Other Other        BPES  . Other Other        BPES  . Hyperlipidemia Mother   . Hypertension Mother   . Diabetes Mother   . Anxiety disorder Mother   . Obesity Mother   . Other Brother        BPES  . Other Sister        BPES  . Other Paternal Uncle  BPES  . Other Cousin        BPES  . Other Cousin        BPES  . Heart attack Maternal Grandmother   . Heart disease Maternal Grandfather   . Heart attack Maternal Grandfather   . Lung cancer Maternal Grandfather   . Heart disease Paternal Grandfather heartr attack  . Heart attack Paternal Grandfather 51       MI  . Sudden death Paternal Grandfather    Allergies as of 05/26/2018   No Known Allergies     Medication List        Accurate as of 05/26/18  1:41 PM. Always use your most recent med list.          buPROPion 150 MG 12 hr tablet Commonly known as:  WELLBUTRIN SR Take 1 tablet (150 mg total) by mouth daily.   Cholecalciferol 125 MCG (5000 UT) capsule Take 1 capsule (5,000 Units total) by mouth daily. With meal   Cyanocobalamin 2500 MCG Subl Place 2,500 mcg under the tongue daily.   estradiol 0.1 mg/24hr patch Commonly known as:  CLIMARA - Dosed in mg/24 hr Place 0.1 mg onto the skin. Apply and change patch  twice weekly   medroxyPROGESTERone 5 MG tablet Commonly known as:  PROVERA Take 1 tablet by mouth every 30 (thirty) days. Pt takes 12 tablets a month.   metFORMIN 500 MG 24 hr tablet Commonly known as:  GLUCOPHAGE-XR Take 3 tablets (1,500 mg total) by mouth daily.   multivitamin capsule Take 1 capsule by mouth daily.       All past medical history, surgical history, allergies, family history, immunizations andmedications were updated in the EMR today and reviewed under the history and medication portions of their EMR.     ROS: Negative, with the exception of above mentioned in HPI   Objective:  BP 120/79   Pulse 63   Temp (!) 97.4 F (36.3 C) (Oral)   Resp 14   Ht 5\' 8"  (1.727 m)   Wt (!) 339 lb (153.8 kg)   SpO2 100%   BMI 51.54 kg/m  Body mass index is 51.54 kg/m. Gen: Afebrile. No acute distress. Nontoxic. Obese, very pleasant female.  HENT: AT. [place].MMM.  Eyes:Pupils Equal Round Reactive to light, Extraocular movements intact,  Conjunctiva without redness, discharge or icterus. Neck/lymp/endocrine: Supple,no lymphadenopathy, no thyromegaly CV: RRR no murmur, no edema, +2/4 P posterior tibialis pulses Chest: CTAB, no wheeze or crackles Abd: Soft. NTND. BS present.  Skin: no rashes, purpura or petechiae.  Neuro:  Normal gait. PERLA. EOMi. Alert. Oriented.  Psych: Normal affect, dress and demeanor. Normal speech. Normal thought content and judgment.    No exam data present No results found. No results found for this or any previous visit (from the past 24 hour(s)).  Assessment/Plan: [name] is a 32 y.o. female present for OV for  Prediabetes Morbid obesity ([place]) PCBMI 50.0-59.9, adult ([place]) PCOS (polycystic ovarian syndrome) - 5.8--> 5.1--> 5.7--> 5.5--> 5.3 (completed 02/16/2018) - metformin use mostly for PCOS. - Continued metformin 1500 mg total QD --> refilled - She is seeing weight management clinic. She has made dietary changes.  - continue f/u  with GYN, tolerating estrogen patches and feels improved. Having mild itchy irritation.  - Routine exercise > 150 min a week encouraged  - F/U 6 mos.    Reviewed expectations re: course of current medical issues.  Discussed self-management of symptoms.  Outlined signs and symptoms indicating need  for more acute intervention.  Patient verbalized understanding and all questions were answered.  Patient received an After-Visit Summary.  Note is dictated utilizing voice recognition software. Although note has been proof read prior to signing, occasional typographical errors still can be missed. If any questions arise, please do not hesitate to call for verification.   electronically signed by:  Howard Pouch, DO  [place]

## 2018-05-26 NOTE — Patient Instructions (Signed)
Refilled metformin for you.  Follow up in 6 months.   Please help [place] help you:  We are honored you have chosen [facility] for your Primary Care home. Below you will find basic instructions that you may need to access in the future. Please help [place] help you by reading the instructions, which cover many of the frequent questions we experience.   Prescription refills and request:  -In order to allow more efficient response time, please call your pharmacy for all refills. They will forward the request electronically to [place]. This allows for the quickest possible response. Request left on a nurse line can take longer to refill, since these are checked as time allows between office patients and other phone calls.  - refill request can take up to 3-5 working days to complete.  - If request is sent electronically and request is appropiate, it is usually completed in 1-2 business days.  - all patients will need to be seen routinely for all chronic medical conditions requiring prescription medications (see follow-up below). If you are overdue for follow up on your condition, you will be asked to make an appointment and we will call in enough medication to cover you until your appointment (up to 30 days).  - all controlled substances will require a face to face visit to request/refill.  - if you desire your prescriptions to go through a new pharmacy, and have an active script at original pharmacy, you will need to call your pharmacy and have scripts transferred to new pharmacy. This is completed between the pharmacy locations and not by your provider.    Results: If any images or labs were ordered, it can take up to 1 week to get results depending on the test ordered and the lab/facility running and resulting the test. - Normal or stable results, which do not need further discussion, may be released to your mychart immediately with attached note to you. A call may not be generated for normal results. Please  make certain to sign up for mychart. If you have questions on how to activate your mychart you can call the front office.  - If your results need further discussion, our office will attempt to contact you via phone, and if unable to reach you after 2 attempts, we will release your abnormal result to your mychart with instructions.  - All results will be automatically released in mychart after 1 week.  - Your provider will provide you with explanation and instruction on all relevant material in your results. Please keep in mind, results and labs may appear confusing or abnormal to the untrained eye, but it does not mean they are actually abnormal for you personally. If you have any questions about your results that are not covered, or you desire more detailed explanation than what was provided, you should make an appointment with your provider to do so.   Our office handles many outgoing and incoming calls daily. If we have not contacted you within 1 week about your results, please check your mychart to see if there is a message first and if not, then contact our office.  In helping with this matter, you help decrease call volume, and therefore allow [place] to be able to respond to patients needs more efficiently.   Acute office visits (sick visit):  An acute visit is intended for a new problem and are scheduled in shorter time slots to allow schedule openings for patients with new problems. This is the appropriate  visit to discuss a new problem. Problems will not be addressed by phone call or Echart message. Appointment is needed if requesting treatment. In order to provide you with excellent quality medical care with proper time for you to explain your problem, have an exam and receive treatment with instructions, these appointments should be limited to one new problem per visit. If you experience a new problem, in which you desire to be addressed, please make an acute office visit, we save openings on the  schedule to accommodate you. Please do not save your new problem for any other type of visit, let us take care of it properly and quickly for you.   Follow up visits:  Depending on your condition(s) your provider will need to see you routinely in order to provide you with quality care and prescribe medication(s). Most chronic conditions (Example: hypertension, Diabetes, depression/anxiety... etc), require visits a couple times a year. Your provider will instruct you on proper follow up for your personal medical conditions and history. Please make certain to make follow up appointments for your condition as instructed. Failing to do so could result in lapse in your medication treatment/refills. If you request a refill, and are overdue to be seen on a condition, we will always provide you with a 30 day script (once) to allow you time to schedule.    Medicare wellness (well visit): - we have a wonderful Nurse Maudie Mercury), that will meet with you and provide you will yearly medicare wellness visits. These visits should occur yearly (can not be scheduled less than 1 calendar year apart) and cover preventive health, immunizations, advance directives and screenings you are entitled to yearly through your medicare benefits. Do not miss out on your entitled benefits, this is when medicare will pay for these benefits to be ordered for you.  These are strongly encouraged by your provider and is the appropriate type of visit to make certain you are up to date with all preventive health benefits. If you have not had your medicare wellness exam in the last 12 months, please make certain to schedule one by calling the office and schedule your medicare wellness with Maudie Mercury as soon as possible.   Yearly physical (well visit):  - Adults are recommended to be seen yearly for physicals. Check with your insurance and date of your last physical, most insurances require one calendar year between physicals. Physicals include all  preventive health topics, screenings, medical exam and labs that are appropriate for gender/age and history. You may have fasting labs needed at this visit. This is a well visit (not a sick visit), new problems should not be covered during this visit (see acute visit).  - Pediatric patients are seen more frequently when they are younger. Your provider will advise you on well child visit timing that is appropriate for your their age. - This is not a medicare wellness visit. Medicare wellness exams do not have an exam portion to the visit. Some medicare companies allow for a physical, some do not allow a yearly physical. If your medicare allows a yearly physical you can schedule the medicare wellness with our nurse Maudie Mercury and have your physical with your provider after, on the same day. Please check with insurance for your full benefits.   Late Policy/No Shows:  - all new patients should arrive 15-30 minutes earlier than appointment to allow [place] time  to  obtain all personal demographics,  insurance information and for you to complete office paperwork. - All established  patients should arrive 10-15 minutes earlier than appointment time to update all information and be checked in .  - In our best efforts to run on time, if you are late for your appointment you will be asked to either reschedule or if able, we will work you back into the schedule. There will be a wait time to work you back in the schedule,  depending on availability.  - If you are unable to make it to your appointment as scheduled, please call 24 hours ahead of time to allow [place] to fill the time slot with someone else who needs to be seen. If you do not cancel your appointment ahead of time, you may be charged a no show fee.

## 2018-05-31 ENCOUNTER — Encounter (INDEPENDENT_AMBULATORY_CARE_PROVIDER_SITE_OTHER): Payer: Self-pay | Admitting: Family Medicine

## 2018-05-31 ENCOUNTER — Ambulatory Visit (INDEPENDENT_AMBULATORY_CARE_PROVIDER_SITE_OTHER): Payer: 59 | Admitting: Family Medicine

## 2018-05-31 VITALS — BP 126/81 | HR 64 | Ht 68.0 in | Wt 335.0 lb

## 2018-05-31 DIAGNOSIS — F3289 Other specified depressive episodes: Secondary | ICD-10-CM

## 2018-05-31 DIAGNOSIS — Z6841 Body Mass Index (BMI) 40.0 and over, adult: Secondary | ICD-10-CM | POA: Diagnosis not present

## 2018-05-31 DIAGNOSIS — Z9189 Other specified personal risk factors, not elsewhere classified: Secondary | ICD-10-CM

## 2018-05-31 DIAGNOSIS — E559 Vitamin D deficiency, unspecified: Secondary | ICD-10-CM

## 2018-05-31 MED ORDER — BUPROPION HCL ER (SR) 150 MG PO TB12: 150.0000 mg | ORAL_TABLET | Freq: Every day | ORAL | 0 refills | Status: DC

## 2018-05-31 NOTE — Progress Notes (Signed)
Office: [phone]  /  Fax: [phone]   HPI:   Chief Complaint: OBESITY [name] is here to discuss her progress with her obesity treatment plan. She is on the Category 4 plan and is following her eating plan approximately 50 % of the time. She states she is doing cardio 30 minutes 3 times per week. [name] has done well with maintaining weight over Thanksgiving. She did well with portion control and with all the celebration eating.  Her weight is (!) 335 lb (152 kg) today and has had a weight gain of 1 pound over a period of 3 weeks since her last visit. She has lost 29 lbs since starting treatment with [place].  Depression with emotional eating behaviors [name] is struggling with emotional eating and using food for comfort to the extent that it is negatively impacting her health. She often snacks when she is not hungry. [name] sometimes feels she is out of control and then feels guilty that she made poor food choices. She has been working on behavior modification techniques to help reduce her emotional eating and has been somewhat successful. Her blood pressure is stable and she denies insomnia. She is doing well with emotional eating.  Vitamin D deficiency [name] has a diagnosis of vitamin D deficiency. She is currently stable on OTC vit D 5,000 units per day. She denies nausea, vomiting, or muscle weakness.  At risk for cardiovascular disease [name] is at a higher than average risk for cardiovascular disease due to vitamin  D deficiency and obesity. She currently denies any chest pain.  ALLERGIES: No Known Allergies  MEDICATIONS: Current Outpatient Medications on File Prior to Visit  Medication Sig Dispense Refill  . Cholecalciferol 5000 units capsule Take 1 capsule (5,000 Units total) by mouth daily. With meal 30 capsule 11  . Cyanocobalamin 2500 MCG SUBL Place 2,500 mcg under the tongue daily.     [name] estradiol (CLIMARA - DOSED IN MG/24 HR) 0.1 mg/24hr patch Place 0.1 mg onto  the skin. Apply and change patch twice weekly    . medroxyPROGESTERone (PROVERA) 5 MG tablet Take 1 tablet by mouth every 30 (thirty) days. Pt takes 12 tablets a month.  11  . metFORMIN (GLUCOPHAGE-XR) 500 MG 24 hr tablet Take 3 tablets (1,500 mg total) by mouth daily. 270 tablet 1  . Multiple Vitamin (MULTIVITAMIN) capsule Take 1 capsule by mouth daily.     No current facility-administered medications on file prior to visit.     PAST MEDICAL HISTORY: Past Medical History:  Diagnosis Date  . Anemia   . Back pain   . BPES syndrome   . Enlarged thyroid   . Infertility, female   . Obesity   . Palpitations   . Palpitations   . PCOS (polycystic ovarian syndrome)   . PONV (postoperative nausea and vomiting)   . Prediabetes   . Premature ovarian failure   . Vitamin B 12 deficiency   . Vitamin D deficiency   . Wears eyeglasses     PAST SURGICAL HISTORY: Past Surgical History:  Procedure Laterality Date  . EYE SURGERY Bilateral   . NASAL SEPTOPLASTY W/ TURBINOPLASTY Bilateral 03/04/2017   Procedure: NASAL SEPTOPLASTY WITH BILATERAL INFERIOR TURBINATE REDUCTION;  Surgeon: Jerrell Belfast, MD;  Location: [facility];  Service: ENT;  Laterality: Bilateral;  . NASAL SEPTUM SURGERY  03/04/2017  . TONSILLECTOMY AND ADENOIDECTOMY Bilateral 03/04/2017   Procedure: TONSILLECTOMY;  Surgeon: Jerrell Belfast, MD;  Location: [facility];  Service: ENT;  Laterality: Bilateral;  SOCIAL HISTORY: Social History   Tobacco Use  . Smoking status: Never Smoker  . Smokeless tobacco: Never Used  Substance Use Topics  . Alcohol use: Yes    Alcohol/week: 0.0 standard drinks    Comment: socially  . Drug use: No    FAMILY HISTORY: Family History  Problem Relation Age of Onset  . Hyperlipidemia Father   . Hypertension Father   . Heart disease Father   . Sudden death Father        Age 30  . Other Father        BPES  . Heart attack Father   . Other Other        BPES  . Other Other        BPES  .  Hyperlipidemia Mother   . Hypertension Mother   . Diabetes Mother   . Anxiety disorder Mother   . Obesity Mother   . Other Brother        BPES  . Other Sister        BPES  . Other Paternal Uncle        BPES  . Other Cousin        BPES  . Other Cousin        BPES  . Heart attack Maternal Grandmother   . Heart disease Maternal Grandfather   . Heart attack Maternal Grandfather   . Lung cancer Maternal Grandfather   . Heart disease Paternal Grandfather heartr attack  . Heart attack Paternal Grandfather 23       MI  . Sudden death Paternal Grandfather     ROS: Review of Systems  Constitutional: Negative for weight loss.  Cardiovascular: Negative for chest pain.  Gastrointestinal: Negative for nausea and vomiting.  Musculoskeletal:       Negative for muscle weakness.  Psychiatric/Behavioral: Positive for depression. The patient does not have insomnia.     PHYSICAL EXAM: Blood pressure 126/81, pulse 64, height 5\' 8"  (1.727 m), weight (!) 335 lb (152 kg), SpO2 99 %. Body mass index is 50.94 kg/m. Physical Exam  Constitutional: She is oriented to person, place, and time. She appears well-developed and well-nourished.  Cardiovascular: Normal rate.  Pulmonary/Chest: Effort normal.  Musculoskeletal: Normal range of motion.  Neurological: She is oriented to person, place, and time.  Skin: Skin is warm and dry.  Psychiatric: She has a normal mood and affect. Her behavior is normal.  Vitals reviewed.   RECENT LABS AND TESTS: BMET    Component Value Date/Time   NA 142 02/16/2018 1133   K 4.5 02/16/2018 1133   CL 104 02/16/2018 1133   CO2 23 02/16/2018 1133   GLUCOSE 87 02/16/2018 1133   GLUCOSE 89 05/25/2017 0843   BUN 14 02/16/2018 1133   CREATININE 0.62 02/16/2018 1133   CALCIUM 9.3 02/16/2018 1133   GFRNONAA 121 02/16/2018 1133   GFRAA 140 02/16/2018 1133   Lab Results  Component Value Date   HGBA1C 5.3 02/16/2018   HGBA1C 5.5 11/02/2017   HGBA1C 5.7  05/25/2017   HGBA1C 5.1 11/29/2016   HGBA1C 5.8 06/18/2016   Lab Results  Component Value Date   INSULIN 16.5 02/16/2018   INSULIN 35.6 (H) 11/02/2017   CBC    Component Value Date/Time   WBC 8.4 11/02/2017 0904   WBC 5.9 05/25/2017 0843   RBC 4.39 11/02/2017 0904   RBC 4.68 05/25/2017 0843   HGB 11.8 11/02/2017 0904   HCT 36.7 11/02/2017 0904   PLT  243.0 05/25/2017 0843   MCV 84 11/02/2017 0904   MCH 26.9 11/02/2017 0904   MCH 27.0 03/04/2017 1157   MCHC 32.2 11/02/2017 0904   MCHC 31.8 05/25/2017 0843   RDW 15.1 11/02/2017 0904   LYMPHSABS 2.4 11/02/2017 0904   MONOABS 0.6 05/25/2017 0843   EOSABS 0.3 11/02/2017 0904   BASOSABS 0.0 11/02/2017 0904   Iron/TIBC/Ferritin/ %Sat    Component Value Date/Time   IRON 30 11/02/2017 0904   TIBC 337 11/02/2017 0904   FERRITIN 28 11/02/2017 0904   IRONPCTSAT 9 (LL) 11/02/2017 0904   Lipid Panel     Component Value Date/Time   CHOL 191 02/16/2018 1133   TRIG 137 02/16/2018 1133   HDL 41 02/16/2018 1133   CHOLHDL 3 05/25/2017 0843   VLDL 20.6 05/25/2017 0843   LDLCALC 123 (H) 02/16/2018 1133   Hepatic Function Panel     Component Value Date/Time   PROT 6.6 02/16/2018 1133   ALBUMIN 4.1 02/16/2018 1133   AST 10 02/16/2018 1133   ALT 11 02/16/2018 1133   ALKPHOS 69 02/16/2018 1133   BILITOT 0.3 02/16/2018 1133      Component Value Date/Time   TSH 2.700 11/02/2017 0904   TSH 1.97 05/25/2017 0843   TSH 1.58 06/18/2016 0952   Results for NIALAH, SARAVIA (MRN [id number]) as of 05/31/2018 13:15  Ref. Range 02/16/2018 11:33  Vitamin D, 25-Hydroxy Latest Ref Range: 30.0 - 100.0 ng/mL 60.9   ASSESSMENT AND PLAN: Vitamin D deficiency  Other depression - with emotional eating  - Plan: buPROPion (WELLBUTRIN SR) 150 MG 12 hr tablet  At risk for heart disease  Class 3 severe obesity with serious comorbidity and body mass index (BMI) of 50.0 to 59.9 in adult, unspecified obesity type ([name])  PLAN:  Depression with  Emotional Eating Behaviors We discussed behavior modification techniques today to help [name] deal with her emotional eating and depression. She has agreed to take Wellbutrin SR 150 mg qd #30 with no refills and agreed to follow up as directed in 3 to 4 weeks.  Vitamin D Deficiency [name] was informed that low vitamin D levels contributes to fatigue and are associated with obesity, breast, and colon cancer. She agrees to continue to take OTC Vit D @5 ,000 IU every day and will follow up for routine testing of vitamin D, at least 2-3 times per year. She was informed of the risk of over-replacement of vitamin D and agrees to not increase her dose unless she discusses this with [place] first. We will recheck labs in 1 month and she agrees to follow up as directed.  Cardiovascular risk counseling [name] was given extended (15 minutes) coronary artery disease prevention counseling today. She is 32 y.o. female and has risk factors for heart disease including vitamin D deficiency and obesity. We discussed intensive lifestyle modifications today with an emphasis on specific weight loss instructions and strategies. Pt was also informed of the importance of increasing exercise and decreasing saturated fats to help prevent heart disease.  Obesity [name] is currently in the action stage of change. As such, her goal is to continue with weight loss efforts. She has agreed to follow the Category 4 plan. [name] has been instructed to work up to a goal of 150 minutes of combined cardio and strengthening exercise per week for weight loss and overall health benefits. We discussed the following Behavioral Modification Strategies today: increasing lean protein intake, decreasing simple carbohydrates, work on meal planning and easy cooking plans,  holiday eating strategies, celebration eating strategies, and emotional eating strategies.  [name] has agreed to follow up with our clinic in 3 to 4 weeks. She was informed of  the importance of frequent follow up visits to maximize her success with intensive lifestyle modifications for her multiple health conditions.   OBESITY BEHAVIORAL INTERVENTION VISIT  Today's visit was # 11   Starting weight: 363 lbs Starting date: 11/02/17 Today's weight : Weight: (!) 335 lb (152 kg)  Today's date: 05/31/2018 Total lbs lost to date: 61  ASK: We discussed the diagnosis of obesity with [name] today and [name] agreed to give [place] permission to discuss obesity behavioral modification therapy today.  ASSESS: [name] has the diagnosis of obesity and her BMI today is 50.9. [name] is in the action stage of change.   ADVISE: [name] was educated on the multiple health risks of obesity as well as the benefit of weight loss to improve her health. She was advised of the need for long term treatment and the importance of lifestyle modifications to improve her current health and to decrease her risk of future health problems.  AGREE: Multiple dietary modification options and treatment options were discussed and [name] agreed to follow the recommendations documented in the above note.  ARRANGE: [name] was educated on the importance of frequent visits to treat obesity as outlined per CMS and USPSTF guidelines and agreed to schedule her next follow up appointment today.  I, Marcille Blanco, am acting as transcriptionist for Starlyn Skeans, MD  I have reviewed the above documentation for accuracy and completeness, and I agree with the above. -Dennard Nip, MD

## 2018-06-19 ENCOUNTER — Ambulatory Visit: Payer: 59 | Admitting: Psychology

## 2018-06-19 DIAGNOSIS — F411 Generalized anxiety disorder: Secondary | ICD-10-CM

## 2018-06-29 ENCOUNTER — Encounter (INDEPENDENT_AMBULATORY_CARE_PROVIDER_SITE_OTHER): Payer: Self-pay | Admitting: Family Medicine

## 2018-06-29 ENCOUNTER — Ambulatory Visit (INDEPENDENT_AMBULATORY_CARE_PROVIDER_SITE_OTHER): Payer: 59 | Admitting: Family Medicine

## 2018-06-29 VITALS — BP 121/81 | HR 62 | Temp 97.8°F | Ht 68.0 in | Wt 336.0 lb

## 2018-06-29 DIAGNOSIS — F3289 Other specified depressive episodes: Secondary | ICD-10-CM

## 2018-06-29 DIAGNOSIS — E559 Vitamin D deficiency, unspecified: Secondary | ICD-10-CM

## 2018-06-29 DIAGNOSIS — Z9189 Other specified personal risk factors, not elsewhere classified: Secondary | ICD-10-CM

## 2018-06-29 DIAGNOSIS — Z6841 Body Mass Index (BMI) 40.0 and over, adult: Secondary | ICD-10-CM

## 2018-07-03 NOTE — Progress Notes (Signed)
Office: [phone]  /  Fax: [phone]   HPI:   Chief Complaint: OBESITY [name] is here to discuss her progress with her obesity treatment plan. She is on the Category 4 plan and is following her eating plan approximately 50 % of the time. She states she is walking 30 to 45 minutes 2 to 3 times per week. [name] did well minimizing weight gain over the holidays. She states that she is ready to get back on track.   Her weight is (!) 336 lb (152.4 kg) today and has had a weight gain of 1 pound over a period of 5 weeks since her last visit. She has lost 27 lbs since starting treatment with [place].  Depression with emotional eating behaviors [name] is stable on Wellbutrin, but feels that she no longer needs this medicine, since her hormones have leveled out. She has been working on behavior modification techniques to help reduce her emotional eating and has been somewhat successful.   Vitamin D deficiency [name] has a diagnosis of vitamin D deficiency. She is currently stable on vit D OTC 5,000 per day and denies nausea, vomiting, or muscle weakness.  Cardiovascular risk counseling [name] was given extended (15 minutes) coronary artery disease prevention counseling today. She is 33 y.o. female and has risk factors for heart disease including prediabetes and obesity. We discussed intensive lifestyle modifications today with an emphasis on specific weight loss instructions and strategies. Pt was also informed of the importance of increasing exercise and decreasing saturated fats to help prevent heart disease.  ASSESSMENT AND PLAN:  Vitamin D deficiency  Other depression - with emotional eating  At risk for heart disease  Class 3 severe obesity with serious comorbidity and body mass index (BMI) of 50.0 to 59.9 in adult, unspecified obesity type ([name])  PLAN:  Vitamin D Deficiency [name] was informed that low vitamin D levels contributes to fatigue and are associated with obesity,  breast, and colon cancer. She agrees to continue to take OTC Vit D @5 ,000 IU every day and will follow up for routine testing of vitamin D, at least 2-3 times per year. She was informed of the risk of over-replacement of vitamin D and agrees to not increase her dose unless she discusses this with [place] first. We will check labs in 2 to 3 weeks and she agrees to follow up at the agreed upon time.  At risk for cardiovascular disease [name] is at a higher than average risk for cardiovascular disease due to vitamin D deficiency and obesity. She currently denies any chest pain.  Depression with Emotional Eating Behaviors We discussed behavior modification techniques today to help [name] deal with her emotional eating and depression. She has agreed to hold on Wellbutrin SR and reassess in 2 to 3 weeks. She agreed with this plan.  Obesity [name] is currently in the action stage of change. As such, her goal is to continue with weight loss efforts. She has agreed to follow the Category 4 plan. [name] has been instructed to work up to a goal of 150 minutes of combined cardio and strengthening exercise per week for weight loss and overall health benefits. We discussed the following Behavioral Modification Strategies today: increasing lean protein intake, decreasing simple carbohydrates, work on meal planning and easy cooking plans, and emotional eating strategies.  [name] has agreed to follow up with our clinic in 2 to 3 weeks for a fasting appointment.  She was informed of the importance of frequent follow up visits to  maximize her success with intensive lifestyle modifications for her multiple health conditions.  ALLERGIES: No Known Allergies  MEDICATIONS: Current Outpatient Medications on File Prior to Visit  Medication Sig Dispense Refill  . Cholecalciferol 5000 units capsule Take 1 capsule (5,000 Units total) by mouth daily. With meal 30 capsule 11  . estradiol (CLIMARA - DOSED IN MG/24 HR) 0.1  mg/24hr patch Place 0.1 mg onto the skin. Apply and change patch twice weekly    . medroxyPROGESTERone (PROVERA) 5 MG tablet Take 1 tablet by mouth every 30 (thirty) days. Pt takes 12 tablets a month.  11  . metFORMIN (GLUCOPHAGE-XR) 500 MG 24 hr tablet Take 3 tablets (1,500 mg total) by mouth daily. 270 tablet 1  . Multiple Vitamin (MULTIVITAMIN) capsule Take 1 capsule by mouth daily.     No current facility-administered medications on file prior to visit.     PAST MEDICAL HISTORY: Past Medical History:  Diagnosis Date  . Anemia   . Back pain   . BPES syndrome   . Enlarged thyroid   . Infertility, female   . Obesity   . Palpitations   . Palpitations   . PCOS (polycystic ovarian syndrome)   . PONV (postoperative nausea and vomiting)   . Prediabetes   . Premature ovarian failure   . Vitamin B 12 deficiency   . Vitamin D deficiency   . Wears eyeglasses     PAST SURGICAL HISTORY: Past Surgical History:  Procedure Laterality Date  . EYE SURGERY Bilateral   . NASAL SEPTOPLASTY W/ TURBINOPLASTY Bilateral 03/04/2017   Procedure: NASAL SEPTOPLASTY WITH BILATERAL INFERIOR TURBINATE REDUCTION;  Surgeon: Jerrell Belfast, MD;  Location: [facility];  Service: ENT;  Laterality: Bilateral;  . NASAL SEPTUM SURGERY  03/04/2017  . TONSILLECTOMY AND ADENOIDECTOMY Bilateral 03/04/2017   Procedure: TONSILLECTOMY;  Surgeon: Jerrell Belfast, MD;  Location: [facility];  Service: ENT;  Laterality: Bilateral;    SOCIAL HISTORY: Social History   Tobacco Use  . Smoking status: Never Smoker  . Smokeless tobacco: Never Used  Substance Use Topics  . Alcohol use: Yes    Alcohol/week: 0.0 standard drinks    Comment: socially  . Drug use: No    FAMILY HISTORY: Family History  Problem Relation Age of Onset  . Hyperlipidemia Father   . Hypertension Father   . Heart disease Father   . Sudden death Father        Age 62  . Other Father        BPES  . Heart attack Father   . Other Other        BPES  .  Other Other        BPES  . Hyperlipidemia Mother   . Hypertension Mother   . Diabetes Mother   . Anxiety disorder Mother   . Obesity Mother   . Other Brother        BPES  . Other Sister        BPES  . Other Paternal Uncle        BPES  . Other Cousin        BPES  . Other Cousin        BPES  . Heart attack Maternal Grandmother   . Heart disease Maternal Grandfather   . Heart attack Maternal Grandfather   . Lung cancer Maternal Grandfather   . Heart disease Paternal Grandfather heartr attack  . Heart attack Paternal Grandfather 99       MI  .  Sudden death Paternal Grandfather     ROS: Review of Systems  Constitutional: Negative for weight loss.  Cardiovascular: Negative for chest pain.  Gastrointestinal: Negative for nausea and vomiting.  Musculoskeletal:       Negative for muscle weakness.  Psychiatric/Behavioral: Positive for depression.    PHYSICAL EXAM: Blood pressure 121/81, pulse 62, temperature 97.8 F (36.6 C), temperature source Oral, height 5\' 8"  (1.727 m), weight (!) 336 lb (152.4 kg), last menstrual period 06/18/2018, SpO2 100 %. Body mass index is 51.09 kg/m. Physical Exam Vitals signs reviewed.  Constitutional:      Appearance: Normal appearance. She is obese.  Cardiovascular:     Rate and Rhythm: Normal rate.  Pulmonary:     Effort: Pulmonary effort is normal.  Musculoskeletal: Normal range of motion.  Skin:    General: Skin is warm and dry.  Neurological:     Mental Status: She is alert and oriented to person, place, and time.  Psychiatric:        Mood and Affect: Mood normal.        Behavior: Behavior normal.     RECENT LABS AND TESTS: BMET    Component Value Date/Time   NA 142 02/16/2018 1133   K 4.5 02/16/2018 1133   CL 104 02/16/2018 1133   CO2 23 02/16/2018 1133   GLUCOSE 87 02/16/2018 1133   GLUCOSE 89 05/25/2017 0843   BUN 14 02/16/2018 1133   CREATININE 0.62 02/16/2018 1133   CALCIUM 9.3 02/16/2018 1133   GFRNONAA 121  02/16/2018 1133   GFRAA 140 02/16/2018 1133   Lab Results  Component Value Date   HGBA1C 5.3 02/16/2018   HGBA1C 5.5 11/02/2017   HGBA1C 5.7 05/25/2017   HGBA1C 5.1 11/29/2016   HGBA1C 5.8 06/18/2016   Lab Results  Component Value Date   INSULIN 16.5 02/16/2018   INSULIN 35.6 (H) 11/02/2017   CBC    Component Value Date/Time   WBC 8.4 11/02/2017 0904   WBC 5.9 05/25/2017 0843   RBC 4.39 11/02/2017 0904   RBC 4.68 05/25/2017 0843   HGB 11.8 11/02/2017 0904   HCT 36.7 11/02/2017 0904   PLT 243.0 05/25/2017 0843   MCV 84 11/02/2017 0904   MCH 26.9 11/02/2017 0904   MCH 27.0 03/04/2017 1157   MCHC 32.2 11/02/2017 0904   MCHC 31.8 05/25/2017 0843   RDW 15.1 11/02/2017 0904   LYMPHSABS 2.4 11/02/2017 0904   MONOABS 0.6 05/25/2017 0843   EOSABS 0.3 11/02/2017 0904   BASOSABS 0.0 11/02/2017 0904   Iron/TIBC/Ferritin/ %Sat    Component Value Date/Time   IRON 30 11/02/2017 0904   TIBC 337 11/02/2017 0904   FERRITIN 28 11/02/2017 0904   IRONPCTSAT 9 (LL) 11/02/2017 0904   Lipid Panel     Component Value Date/Time   CHOL 191 02/16/2018 1133   TRIG 137 02/16/2018 1133   HDL 41 02/16/2018 1133   CHOLHDL 3 05/25/2017 0843   VLDL 20.6 05/25/2017 0843   LDLCALC 123 (H) 02/16/2018 1133   Hepatic Function Panel     Component Value Date/Time   PROT 6.6 02/16/2018 1133   ALBUMIN 4.1 02/16/2018 1133   AST 10 02/16/2018 1133   ALT 11 02/16/2018 1133   ALKPHOS 69 02/16/2018 1133   BILITOT 0.3 02/16/2018 1133      Component Value Date/Time   TSH 2.700 11/02/2017 0904   TSH 1.97 05/25/2017 0843   TSH 1.58 06/18/2016 0952   Results for JAYMARIE, YEAKEL (MRN [id number])  as of 07/03/2018 12:48  Ref. Range 02/16/2018 11:33  Vitamin D, 25-Hydroxy Latest Ref Range: 30.0 - 100.0 ng/mL 60.9     OBESITY BEHAVIORAL INTERVENTION VISIT  Today's visit was # 12   Starting weight: 363 lbs Starting date: 11/02/17 Today's weight : Weight: (!) 336 lb (152.4 kg)  Today's date:  06/29/2018 Total lbs lost to date: 65  ASK: We discussed the diagnosis of obesity with [name] today and [name] agreed to give [place] permission to discuss obesity behavioral modification therapy today.  ASSESS: [name] has the diagnosis of obesity and her BMI today is 51.1. [name] is in the action stage of change.   ADVISE: [name] was educated on the multiple health risks of obesity as well as the benefit of weight loss to improve her health. She was advised of the need for long term treatment and the importance of lifestyle modifications to improve her current health and to decrease her risk of future health problems.  AGREE: Multiple dietary modification options and treatment options were discussed and [name] agreed to follow the recommendations documented in the above note.  ARRANGE: [name] was educated on the importance of frequent visits to treat obesity as outlined per CMS and USPSTF guidelines and agreed to schedule her next follow up appointment today.  I, Marcille Blanco, am acting as transcriptionist for Starlyn Skeans, MD  I have reviewed the above documentation for accuracy and completeness, and I agree with the above. -Dennard Nip, MD

## 2018-07-07 DIAGNOSIS — Z6841 Body Mass Index (BMI) 40.0 and over, adult: Secondary | ICD-10-CM | POA: Diagnosis not present

## 2018-07-07 DIAGNOSIS — Z1151 Encounter for screening for human papillomavirus (HPV): Secondary | ICD-10-CM | POA: Diagnosis not present

## 2018-07-07 DIAGNOSIS — Z01419 Encounter for gynecological examination (general) (routine) without abnormal findings: Secondary | ICD-10-CM | POA: Diagnosis not present

## 2018-07-17 ENCOUNTER — Ambulatory Visit: Payer: 59 | Admitting: Psychology

## 2018-07-17 ENCOUNTER — Encounter (INDEPENDENT_AMBULATORY_CARE_PROVIDER_SITE_OTHER): Payer: Self-pay | Admitting: Physician Assistant

## 2018-07-17 ENCOUNTER — Ambulatory Visit (INDEPENDENT_AMBULATORY_CARE_PROVIDER_SITE_OTHER): Payer: 59 | Admitting: Physician Assistant

## 2018-07-17 VITALS — BP 130/73 | HR 72 | Temp 97.9°F | Ht 68.0 in | Wt 335.0 lb

## 2018-07-17 DIAGNOSIS — E7849 Other hyperlipidemia: Secondary | ICD-10-CM

## 2018-07-17 DIAGNOSIS — E559 Vitamin D deficiency, unspecified: Secondary | ICD-10-CM

## 2018-07-17 DIAGNOSIS — F411 Generalized anxiety disorder: Secondary | ICD-10-CM | POA: Diagnosis not present

## 2018-07-17 DIAGNOSIS — Z9189 Other specified personal risk factors, not elsewhere classified: Secondary | ICD-10-CM | POA: Diagnosis not present

## 2018-07-17 DIAGNOSIS — Z6841 Body Mass Index (BMI) 40.0 and over, adult: Secondary | ICD-10-CM | POA: Diagnosis not present

## 2018-07-17 DIAGNOSIS — R7303 Prediabetes: Secondary | ICD-10-CM

## 2018-07-17 NOTE — Progress Notes (Signed)
Office: [phone]  /  Fax: [phone]   HPI:   Chief Complaint: OBESITY [name] is here to discuss her progress with her obesity treatment plan. She is on the Category 4 plan and is following her eating plan approximately 95 % of the time. She states she is doing cardio 30 minutes 3-4 times per week. [name] did well weight loss. She reports that she is supposed to be journaling for lunch however she has not been counting calories or protein. She is not getting enough protein for lunch at times. Her weight is (!) 335 lb (152 kg) today and has had a weight loss of 1 pounds over a period of 3 weeks since her last visit. She has lost 28 lbs since starting treatment with [place].  Pre-Diabetes [name] has a diagnosis of prediabetes based on her elevated Hgb A1c and was informed this puts her at greater risk of developing diabetes. She is taking metformin currently and continues to work on diet and exercise to decrease risk of diabetes. She denies nausea, vomiting or diarrhea. She denies polyphagia or hypoglycemia.  Vitamin D deficiency [name] has a diagnosis of vitamin D deficiency. She is currently taking OTC Vit D and denies nausea, vomiting or muscle weakness. [name]'s last vitamin D level was at goal.   Hyperlipidemia [name] has hyperlipidemia and has been trying to improve her cholesterol levels with intensive lifestyle modification including a low saturated fat diet, exercise and weight loss. She denies any chest pain.  At risk for diabetes [name] is at higher than average risk for developing diabetes due to her obesity. She currently denies polyuria or polydipsia.   ASSESSMENT AND PLAN:  Prediabetes - Plan: Comprehensive metabolic panel, Hemoglobin A1c, Insulin, random  Vitamin D deficiency - Plan: VITAMIN D 25 Hydroxy (Vit-D Deficiency, Fractures)  Other hyperlipidemia - Plan: Lipid Panel With LDL/HDL Ratio  At risk for diabetes mellitus  Class 3 severe obesity with  serious comorbidity and body mass index (BMI) of 50.0 to 59.9 in adult, unspecified obesity type ([place])  PLAN:  Pre-Diabetes [name] will continue to work on weight loss, exercise, and decreasing simple carbohydrates in her diet to help decrease the risk of diabetes. We dicussed metformin including benefits and risks. She was informed that eating too many simple carbohydrates or too many calories at one sitting increases the likelihood of GI side effects. [name] will continue taking metformin for now and a prescription was not written today. We will check labs. [name] agrees to follow up with our clinic in 2-3 weeks.  Vitamin D Deficiency [name] was informed that low vitamin D levels contributes to fatigue and are associated with obesity, breast, and colon cancer. She agrees to continue to take prescription Vit D 5,000 IU every day and will follow up for routine testing of vitamin D, at least 2-3 times per year. She was informed of the risk of over-replacement of vitamin D and agrees to not increase her dose unless she discusses this with [place] first. We will check labs. [name] agrees to follow up with our clinic in 2-3 weeks.  Hyperlipidemia [name] was informed of the American Heart Association Guidelines emphasizing intensive lifestyle modifications as the first line treatment for hyperlipidemia. We discussed many lifestyle modifications today in depth, and [name] will continue to work on decreasing saturated fats such as fatty red meat, butter and many fried foods. She will also increase vegetables and lean protein in her diet and continue to work on exercise and weight loss efforts. We  will check labs. [name] agrees to follow up with our clinic in 2-3 weeks.  Diabetes risk counseling [name] was given extended (15 minutes) diabetes prevention counseling today. She is 33 y.o. female and has risk factors for diabetes including obesity. We discussed intensive lifestyle modifications today with an  emphasis on weight loss as well as increasing exercise and decreasing simple carbohydrates in her diet.  Obesity [name] is currently in the action stage of change. As such, her goal is to continue with weight loss efforts She has agreed to follow the Category 4 plan and journaling 550-650 calories and 40 grams of protein at lunch [name] has been instructed to work up to a goal of 150 minutes of combined cardio and strengthening exercise per week for weight loss and overall health benefits. We discussed the following Behavioral Modification Strategies today: work on meal planning and easy cooking plans and planning for success  [name] has agreed to follow up with our clinic in 2-3 weeks. She was informed of the importance of frequent follow up visits to maximize her success with intensive lifestyle modifications for her multiple health conditions.  ALLERGIES: No Known Allergies  MEDICATIONS: Current Outpatient Medications on File Prior to Visit  Medication Sig Dispense Refill  . Cholecalciferol 5000 units capsule Take 1 capsule (5,000 Units total) by mouth daily. With meal 30 capsule 11  . estradiol (CLIMARA - DOSED IN MG/24 HR) 0.1 mg/24hr patch Place 0.1 mg onto the skin. Apply and change patch twice weekly    . medroxyPROGESTERone (PROVERA) 5 MG tablet Take 1 tablet by mouth every 30 (thirty) days. Pt takes 12 tablets a month.  11  . metFORMIN (GLUCOPHAGE-XR) 500 MG 24 hr tablet Take 3 tablets (1,500 mg total) by mouth daily. 270 tablet 1  . Multiple Vitamin (MULTIVITAMIN) capsule Take 1 capsule by mouth daily.     No current facility-administered medications on file prior to visit.     PAST MEDICAL HISTORY: Past Medical History:  Diagnosis Date  . Anemia   . Back pain   . BPES syndrome   . Enlarged thyroid   . Infertility, female   . Obesity   . Palpitations   . Palpitations   . PCOS (polycystic ovarian syndrome)   . PONV (postoperative nausea and vomiting)   .  Prediabetes   . Premature ovarian failure   . Vitamin B 12 deficiency   . Vitamin D deficiency   . Wears eyeglasses     PAST SURGICAL HISTORY: Past Surgical History:  Procedure Laterality Date  . EYE SURGERY Bilateral   . NASAL SEPTOPLASTY W/ TURBINOPLASTY Bilateral 03/04/2017   Procedure: NASAL SEPTOPLASTY WITH BILATERAL INFERIOR TURBINATE REDUCTION;  Surgeon: Jerrell Belfast, MD;  Location: [facility];  Service: ENT;  Laterality: Bilateral;  . NASAL SEPTUM SURGERY  03/04/2017  . TONSILLECTOMY AND ADENOIDECTOMY Bilateral 03/04/2017   Procedure: TONSILLECTOMY;  Surgeon: Jerrell Belfast, MD;  Location: [facility];  Service: ENT;  Laterality: Bilateral;    SOCIAL HISTORY: Social History   Tobacco Use  . Smoking status: Never Smoker  . Smokeless tobacco: Never Used  Substance Use Topics  . Alcohol use: Yes    Alcohol/week: 0.0 standard drinks    Comment: socially  . Drug use: No    FAMILY HISTORY: Family History  Problem Relation Age of Onset  . Hyperlipidemia Father   . Hypertension Father   . Heart disease Father   . Sudden death Father        Age 59  .  Other Father        BPES  . Heart attack Father   . Other Other        BPES  . Other Other        BPES  . Hyperlipidemia Mother   . Hypertension Mother   . Diabetes Mother   . Anxiety disorder Mother   . Obesity Mother   . Other Brother        BPES  . Other Sister        BPES  . Other Paternal Uncle        BPES  . Other Cousin        BPES  . Other Cousin        BPES  . Heart attack Maternal Grandmother   . Heart disease Maternal Grandfather   . Heart attack Maternal Grandfather   . Lung cancer Maternal Grandfather   . Heart disease Paternal Grandfather heartr attack  . Heart attack Paternal Grandfather 1       MI  . Sudden death Paternal Grandfather     ROS: Review of Systems  Constitutional: Positive for weight loss.  Cardiovascular: Negative for chest pain.  Gastrointestinal: Negative for nausea and  vomiting.  Genitourinary:       Negative for polyuria  Musculoskeletal:       Negative for muscle weakness  Endo/Heme/Allergies: Negative for polydipsia.       Negative for hypoglycemia Negative for polyphagia    PHYSICAL EXAM: Blood pressure 130/73, pulse 72, temperature 97.9 F (36.6 C), temperature source Oral, height 5\' 8"  (1.727 m), weight (!) 335 lb (152 kg), last menstrual period 06/18/2018, SpO2 100 %. Body mass index is 50.94 kg/m. Physical Exam Vitals signs reviewed.  Constitutional:      Appearance: Normal appearance. She is obese.  Cardiovascular:     Rate and Rhythm: Normal rate.     Pulses: Normal pulses.  Pulmonary:     Effort: Pulmonary effort is normal.  Musculoskeletal: Normal range of motion.  Skin:    General: Skin is warm and dry.  Neurological:     Mental Status: She is alert and oriented to person, place, and time.  Psychiatric:        Mood and Affect: Mood normal.        Behavior: Behavior normal.     RECENT LABS AND TESTS: BMET    Component Value Date/Time   NA 142 02/16/2018 1133   K 4.5 02/16/2018 1133   CL 104 02/16/2018 1133   CO2 23 02/16/2018 1133   GLUCOSE 87 02/16/2018 1133   GLUCOSE 89 05/25/2017 0843   BUN 14 02/16/2018 1133   CREATININE 0.62 02/16/2018 1133   CALCIUM 9.3 02/16/2018 1133   GFRNONAA 121 02/16/2018 1133   GFRAA 140 02/16/2018 1133   Lab Results  Component Value Date   HGBA1C 5.3 02/16/2018   HGBA1C 5.5 11/02/2017   HGBA1C 5.7 05/25/2017   HGBA1C 5.1 11/29/2016   HGBA1C 5.8 06/18/2016   Lab Results  Component Value Date   INSULIN 16.5 02/16/2018   INSULIN 35.6 (H) 11/02/2017   CBC    Component Value Date/Time   WBC 8.4 11/02/2017 0904   WBC 5.9 05/25/2017 0843   RBC 4.39 11/02/2017 0904   RBC 4.68 05/25/2017 0843   HGB 11.8 11/02/2017 0904   HCT 36.7 11/02/2017 0904   PLT 243.0 05/25/2017 0843   MCV 84 11/02/2017 0904   MCH 26.9 11/02/2017 0904   MCH 27.0 03/04/2017  1157   MCHC 32.2  11/02/2017 0904   MCHC 31.8 05/25/2017 0843   RDW 15.1 11/02/2017 0904   LYMPHSABS 2.4 11/02/2017 0904   MONOABS 0.6 05/25/2017 0843   EOSABS 0.3 11/02/2017 0904   BASOSABS 0.0 11/02/2017 0904   Iron/TIBC/Ferritin/ %Sat    Component Value Date/Time   IRON 30 11/02/2017 0904   TIBC 337 11/02/2017 0904   FERRITIN 28 11/02/2017 0904   IRONPCTSAT 9 (LL) 11/02/2017 0904   Lipid Panel     Component Value Date/Time   CHOL 191 02/16/2018 1133   TRIG 137 02/16/2018 1133   HDL 41 02/16/2018 1133   CHOLHDL 3 05/25/2017 0843   VLDL 20.6 05/25/2017 0843   LDLCALC 123 (H) 02/16/2018 1133   Hepatic Function Panel     Component Value Date/Time   PROT 6.6 02/16/2018 1133   ALBUMIN 4.1 02/16/2018 1133   AST 10 02/16/2018 1133   ALT 11 02/16/2018 1133   ALKPHOS 69 02/16/2018 1133   BILITOT 0.3 02/16/2018 1133      Component Value Date/Time   TSH 2.700 11/02/2017 0904   TSH 1.97 05/25/2017 0843   TSH 1.58 06/18/2016 0952    Ref. Range 02/16/2018 11:33  Vitamin D, 25-Hydroxy Latest Ref Range: 30.0 - 100.0 ng/mL 60.9      OBESITY BEHAVIORAL INTERVENTION VISIT  Today's visit was # 13   Starting weight: 363 lbs Starting date: 11/02/2017 Today's weight : 335 lbs  Today's date: 07/17/2018 Total lbs lost to date: 47  ASK: We discussed the diagnosis of obesity with [name] today and [name] agreed to give [place] permission to discuss obesity behavioral modification therapy today.  ASSESS: [place] has the diagnosis of obesity and her BMI today is 50.95 [name] is in the action stage of change   ADVISE: [name] was educated on the multiple health risks of obesity as well as the benefit of weight loss to improve her health. She was advised of the need for long term treatment and the importance of lifestyle modifications to improve her current health and to decrease her risk of future health problems.  AGREE: Multiple dietary modification options and treatment options were  discussed and  [name] agreed to follow the recommendations documented in the above note.  ARRANGE: [name] was educated on the importance of frequent visits to treat obesity as outlined per CMS and USPSTF guidelines and agreed to schedule her next follow up appointment today.  I, Tammy Wysor, am acting as Location manager for [employer] I, Abby Potash, PA-C have reviewed above note and agree with its content

## 2018-07-18 LAB — COMPREHENSIVE METABOLIC PANEL
ALBUMIN: 4.1 g/dL (ref 3.9–5.0)
ALK PHOS: 62 IU/L (ref 39–117)
ALT: 9 IU/L (ref 0–32)
AST: 9 IU/L (ref 0–40)
Albumin/Globulin Ratio: 1.8 (ref 1.2–2.2)
BUN / CREAT RATIO: 26 — AB (ref 9–23)
BUN: 17 mg/dL (ref 6–20)
Bilirubin Total: 0.2 mg/dL (ref 0.0–1.2)
CHLORIDE: 106 mmol/L (ref 96–106)
CO2: 21 mmol/L (ref 20–29)
Calcium: 9.3 mg/dL (ref 8.7–10.2)
Creatinine, Ser: 0.66 mg/dL (ref 0.57–1.00)
GFR calc Af Amer: 137 mL/min/{1.73_m2} (ref >59)
GFR calc non Af Amer: 119 mL/min/{1.73_m2} (ref >59)
GLUCOSE: 86 mg/dL (ref 65–99)
Globulin, Total: 2.3 g/dL (ref 1.5–4.5)
Potassium: 4.6 mmol/L (ref 3.5–5.2)
Sodium: 141 mmol/L (ref 134–144)
Total Protein: 6.4 g/dL (ref 6.0–8.5)

## 2018-07-18 LAB — LIPID PANEL WITH LDL/HDL RATIO
CHOLESTEROL TOTAL: 168 mg/dL (ref 100–199)
HDL: 45 mg/dL (ref >39)
LDL Calculated: 104 mg/dL — ABNORMAL HIGH (ref 0–99)
LDL/HDL RATIO: 2.3 ratio (ref 0.0–3.2)
TRIGLYCERIDES: 94 mg/dL (ref 0–149)
VLDL CHOLESTEROL CAL: 19 mg/dL (ref 5–40)

## 2018-07-18 LAB — HEMOGLOBIN A1C
ESTIMATED AVERAGE GLUCOSE: 100 mg/dL
HEMOGLOBIN A1C: 5.1 % (ref 4.8–5.6)

## 2018-07-18 LAB — VITAMIN D 25 HYDROXY (VIT D DEFICIENCY, FRACTURES): VIT D 25 HYDROXY: 47.6 ng/mL (ref 30.0–100.0)

## 2018-07-18 LAB — INSULIN, RANDOM: INSULIN: 15 u[IU]/mL (ref 2.6–24.9)

## 2018-08-01 ENCOUNTER — Encounter (INDEPENDENT_AMBULATORY_CARE_PROVIDER_SITE_OTHER): Payer: Self-pay | Admitting: Physician Assistant

## 2018-08-01 ENCOUNTER — Ambulatory Visit (INDEPENDENT_AMBULATORY_CARE_PROVIDER_SITE_OTHER): Payer: 59 | Admitting: Physician Assistant

## 2018-08-01 VITALS — BP 120/71 | HR 60 | Temp 97.7°F | Ht 68.0 in | Wt 333.0 lb

## 2018-08-01 DIAGNOSIS — E559 Vitamin D deficiency, unspecified: Secondary | ICD-10-CM | POA: Diagnosis not present

## 2018-08-01 DIAGNOSIS — Z6841 Body Mass Index (BMI) 40.0 and over, adult: Secondary | ICD-10-CM | POA: Diagnosis not present

## 2018-08-01 NOTE — Progress Notes (Signed)
Office: [phone]  /  Fax: [phone]   HPI:   Chief Complaint: OBESITY [name] is here to discuss her progress with her obesity treatment plan. She is on the Category 4 plan and journaling 550-650 calories and 40 grams of protein at lunch and is following her eating plan approximately 95% of the time. She states she is doing cardio 30 minutes 3 times per week. [name] did well with weight loss. She reports that she has been working hard to get all of her protein in. She wants to start resistance training.   Her weight is (!) 333 lb (151 kg) today and has had a weight loss of 2 pounds over a period of 2 weeks since her last visit. She has lost 30 lbs since starting treatment with [place].  Vitamin D deficiency [name] has a diagnosis of Vitamin D deficiency. She is currently taking over-the-counter Vit D and denies nausea, vomiting or muscle weakness.  ASSESSMENT AND PLAN:  Vitamin D deficiency  Class 3 severe obesity with serious comorbidity and body mass index (BMI) of 50.0 to 59.9 in adult, unspecified obesity type ([place])  PLAN:  Vitamin D Deficiency [name] was informed that low Vitamin D levels contributes to fatigue and are associated with obesity, breast, and colon cancer. She agrees to continue to taking over-the-counter Vit D and will follow-up for routine testing of Vitamin D, at least 2-3 times per year. She was informed of the risk of over-replacement of vitamin D and agrees to not increase her dose unless she discusses this with [place] first.  I spent > than 50% of the 15 minute visit on counseling as documented in the note.  Obesity [name] is currently in the action stage of change. As such, her goal is to continue with weight loss efforts. She has agreed to follow the Category 4 plan. [name] has been instructed to work up to a goal of 150 minutes of combined cardio and strengthening exercise per week for weight loss and overall health benefits. We discussed the  following Behavioral Modification Strategies today: increasing lean protein intake and work on meal planning and easy cooking plans.  [name] has agreed to follow up with our clinic in 2-3 weeks. She was informed of the importance of frequent follow up visits to maximize her success with intensive lifestyle modifications for her multiple health conditions.  ALLERGIES: No Known Allergies  MEDICATIONS: Current Outpatient Medications on File Prior to Visit  Medication Sig Dispense Refill  . Cholecalciferol 5000 units capsule Take 1 capsule (5,000 Units total) by mouth daily. With meal 30 capsule 11  . estradiol (CLIMARA - DOSED IN MG/24 HR) 0.1 mg/24hr patch Place 0.1 mg onto the skin. Apply and change patch twice weekly    . medroxyPROGESTERone (PROVERA) 5 MG tablet Take 1 tablet by mouth every 30 (thirty) days. Pt takes 12 tablets a month.  11  . metFORMIN (GLUCOPHAGE-XR) 500 MG 24 hr tablet Take 3 tablets (1,500 mg total) by mouth daily. 270 tablet 1  . Multiple Vitamin (MULTIVITAMIN) capsule Take 1 capsule by mouth daily.     No current facility-administered medications on file prior to visit.     PAST MEDICAL HISTORY: Past Medical History:  Diagnosis Date  . Anemia   . Back pain   . BPES syndrome   . Enlarged thyroid   . Infertility, female   . Obesity   . Palpitations   . Palpitations   . PCOS (polycystic ovarian syndrome)   . PONV (postoperative  nausea and vomiting)   . Prediabetes   . Premature ovarian failure   . Vitamin B 12 deficiency   . Vitamin D deficiency   . Wears eyeglasses     PAST SURGICAL HISTORY: Past Surgical History:  Procedure Laterality Date  . EYE SURGERY Bilateral   . NASAL SEPTOPLASTY W/ TURBINOPLASTY Bilateral 03/04/2017   Procedure: NASAL SEPTOPLASTY WITH BILATERAL INFERIOR TURBINATE REDUCTION;  Surgeon: Jerrell Belfast, MD;  Location: [place];  Service: ENT;  Laterality: Bilateral;  . NASAL SEPTUM SURGERY  03/04/2017  . TONSILLECTOMY AND  ADENOIDECTOMY Bilateral 03/04/2017   Procedure: TONSILLECTOMY;  Surgeon: Jerrell Belfast, MD;  Location: [place];  Service: ENT;  Laterality: Bilateral;    SOCIAL HISTORY: Social History   Tobacco Use  . Smoking status: Never Smoker  . Smokeless tobacco: Never Used  Substance Use Topics  . Alcohol use: Yes    Alcohol/week: 0.0 standard drinks    Comment: socially  . Drug use: No    FAMILY HISTORY: Family History  Problem Relation Age of Onset  . Hyperlipidemia Father   . Hypertension Father   . Heart disease Father   . Sudden death Father        Age 3  . Other Father        BPES  . Heart attack Father   . Other Other        BPES  . Other Other        BPES  . Hyperlipidemia Mother   . Hypertension Mother   . Diabetes Mother   . Anxiety disorder Mother   . Obesity Mother   . Other Brother        BPES  . Other Sister        BPES  . Other Paternal Uncle        BPES  . Other Cousin        BPES  . Other Cousin        BPES  . Heart attack Maternal Grandmother   . Heart disease Maternal Grandfather   . Heart attack Maternal Grandfather   . Lung cancer Maternal Grandfather   . Heart disease Paternal Grandfather heartr attack  . Heart attack Paternal Grandfather 31       MI  . Sudden death Paternal Grandfather    ROS: Review of Systems  Constitutional: Positive for weight loss.  Gastrointestinal: Negative for nausea and vomiting.  Musculoskeletal:       Negative for muscle weakness.  Endo/Heme/Allergies:       Negative for hypoglycemia.   PHYSICAL EXAM: Blood pressure 120/71, pulse 60, temperature 97.7 F (36.5 C), temperature source Oral, height 5\' 8"  (1.727 m), weight (!) 333 lb (151 kg), last menstrual period 07/17/2018, SpO2 99 %. Body mass index is 50.63 kg/m. Physical Exam Vitals signs reviewed.  Constitutional:      Appearance: Normal appearance. She is obese.  Cardiovascular:     Rate and Rhythm: Normal rate.     Pulses: Normal pulses.    Pulmonary:     Effort: Pulmonary effort is normal.     Breath sounds: Normal breath sounds.  Musculoskeletal: Normal range of motion.  Skin:    General: Skin is warm and dry.  Neurological:     Mental Status: She is alert and oriented to person, place, and time.  Psychiatric:        Behavior: Behavior normal.   RECENT LABS AND TESTS: BMET    Component Value Date/Time   NA  141 07/17/2018 0915   K 4.6 07/17/2018 0915   CL 106 07/17/2018 0915   CO2 21 07/17/2018 0915   GLUCOSE 86 07/17/2018 0915   GLUCOSE 89 05/25/2017 0843   BUN 17 07/17/2018 0915   CREATININE 0.66 07/17/2018 0915   CALCIUM 9.3 07/17/2018 0915   GFRNONAA 119 07/17/2018 0915   GFRAA 137 07/17/2018 0915   Lab Results  Component Value Date   HGBA1C 5.1 07/17/2018   HGBA1C 5.3 02/16/2018   HGBA1C 5.5 11/02/2017   HGBA1C 5.7 05/25/2017   HGBA1C 5.1 11/29/2016   Lab Results  Component Value Date   INSULIN 15.0 07/17/2018   INSULIN 16.5 02/16/2018   INSULIN 35.6 (H) 11/02/2017   CBC    Component Value Date/Time   WBC 8.4 11/02/2017 0904   WBC 5.9 05/25/2017 0843   RBC 4.39 11/02/2017 0904   RBC 4.68 05/25/2017 0843   HGB 11.8 11/02/2017 0904   HCT 36.7 11/02/2017 0904   PLT 243.0 05/25/2017 0843   MCV 84 11/02/2017 0904   MCH 26.9 11/02/2017 0904   MCH 27.0 03/04/2017 1157   MCHC 32.2 11/02/2017 0904   MCHC 31.8 05/25/2017 0843   RDW 15.1 11/02/2017 0904   LYMPHSABS 2.4 11/02/2017 0904   MONOABS 0.6 05/25/2017 0843   EOSABS 0.3 11/02/2017 0904   BASOSABS 0.0 11/02/2017 0904   Iron/TIBC/Ferritin/ %Sat    Component Value Date/Time   IRON 30 11/02/2017 0904   TIBC 337 11/02/2017 0904   FERRITIN 28 11/02/2017 0904   IRONPCTSAT 9 (LL) 11/02/2017 0904   Lipid Panel     Component Value Date/Time   CHOL 168 07/17/2018 0915   TRIG 94 07/17/2018 0915   HDL 45 07/17/2018 0915   CHOLHDL 3 05/25/2017 0843   VLDL 20.6 05/25/2017 0843   LDLCALC 104 (H) 07/17/2018 0915   Hepatic Function Panel      Component Value Date/Time   PROT 6.4 07/17/2018 0915   ALBUMIN 4.1 07/17/2018 0915   AST 9 07/17/2018 0915   ALT 9 07/17/2018 0915   ALKPHOS 62 07/17/2018 0915   BILITOT 0.2 07/17/2018 0915      Component Value Date/Time   TSH 2.700 11/02/2017 0904   TSH 1.97 05/25/2017 0843   TSH 1.58 06/18/2016 0952   Results for STEPHAIE, DARDIS (MRN [id number]) as of 08/01/2018 12:38  Ref. Range 07/17/2018 09:15  Vitamin D, 25-Hydroxy Latest Ref Range: 30.0 - 100.0 ng/mL 47.6   OBESITY BEHAVIORAL INTERVENTION VISIT  Today's visit was #14  Starting weight: 363 lbs Starting date: 11/02/2017 Today's weight: 333 lbs Today's date: 08/01/2018 Total lbs lost to date: 39  ASK: We discussed the diagnosis of obesity with Johnsie Cancel today and Aramis agreed to give [place] permission to discuss obesity behavioral modification therapy today.  ASSESS: Kamarie has the diagnosis of obesity and her BMI today is @ 50.63. Tilda is in the action stage of change.  ADVISE: Xana was educated on the multiple health risks of obesity as well as the benefit of weight loss to improve her health. She was advised of the need for long term treatment and the importance of lifestyle modifications to improve her current health and to decrease her risk of future health problems.  AGREE: Multiple dietary modification options and treatment options were discussed and  Iyauna agreed to follow the recommendations documented in the above note.  ARRANGE: Myrl was educated on the importance of frequent visits to treat obesity as outlined per CMS and USPSTF guidelines  and agreed to schedule her next follow up appointment today.  Migdalia Dk, am acting as transcriptionist for Abby Potash, PA-C I, Abby Potash, PA-C have reviewed above note and agree with its content

## 2018-08-14 ENCOUNTER — Ambulatory Visit: Payer: 59 | Admitting: Psychology

## 2018-08-14 DIAGNOSIS — F411 Generalized anxiety disorder: Secondary | ICD-10-CM | POA: Diagnosis not present

## 2018-08-24 ENCOUNTER — Ambulatory Visit (INDEPENDENT_AMBULATORY_CARE_PROVIDER_SITE_OTHER): Payer: 59 | Admitting: Physician Assistant

## 2018-08-24 VITALS — BP 126/77 | HR 68 | Temp 97.8°F | Ht 68.0 in | Wt 333.0 lb

## 2018-08-24 DIAGNOSIS — E559 Vitamin D deficiency, unspecified: Secondary | ICD-10-CM | POA: Diagnosis not present

## 2018-08-24 DIAGNOSIS — Z6841 Body Mass Index (BMI) 40.0 and over, adult: Secondary | ICD-10-CM

## 2018-08-24 NOTE — Progress Notes (Signed)
Office: [phone]  /  Fax: [phone]   HPI:   Chief Complaint: OBESITY Cadee is here to discuss her progress with her obesity treatment plan. She is on the Category 4 plan and is following her eating plan approximately 85% of the time. She states she is doing cardio and walking 1 mile 30 minutes 2-3 times per week. Saquoia reports that she has been sick with a URI and has been unable to eat all of the food on her plan. She states she is not hydrating well enough. Her weight is (!) 333 lb (151 kg) today and has not lost weight since her last visit. She has lost 30 lbs since starting treatment with [place].  Vitamin D deficiency Lashawn has a diagnosis of Vitamin D deficiency. She is currently taking OTC Vit D and denies nausea, vomiting or muscle weakness.  ASSESSMENT AND PLAN:  Vitamin D deficiency  Class 3 severe obesity with serious comorbidity and body mass index (BMI) of 50.0 to 59.9 in adult, unspecified obesity type ([place])  PLAN:  Vitamin D Deficiency Dayanira was informed that low Vitamin D levels contributes to fatigue and are associated with obesity, breast, and colon cancer. She agrees to continue to take OTC Vit D and will follow-up for routine testing of Vitamin D, at least 2-3 times per year. She was informed of the risk of over-replacement of Vitamin D and agrees to not increase her dose unless she discusses this with [place] first. Bayyinah agrees to follow-up with our clinic in 3 weeks.  Obesity Bettyanne is currently in the action stage of change. As such, her goal is to continue with weight loss efforts She has agreed to follow the Category 4 plan. Lucina has been instructed to work up to a goal of 150 minutes of combined cardio and strengthening exercise per week for weight loss and overall health benefits. We discussed the following Behavioral Modification Strategies today: no skipping meals and work on meal planning and easy cooking plans.  Charisma has agreed to  follow-up with our clinic in 3 weeks. She was informed of the importance of frequent follow up visits to maximize her success with intensive lifestyle modifications for her multiple health conditions.  ALLERGIES: No Known Allergies  MEDICATIONS: Current Outpatient Medications on File Prior to Visit  Medication Sig Dispense Refill  . Cholecalciferol 5000 units capsule Take 1 capsule (5,000 Units total) by mouth daily. With meal 30 capsule 11  . estradiol (CLIMARA - DOSED IN MG/24 HR) 0.1 mg/24hr patch Place 0.1 mg onto the skin. Apply and change patch twice weekly    . medroxyPROGESTERone (PROVERA) 5 MG tablet Take 1 tablet by mouth every 30 (thirty) days. Pt takes 12 tablets a month.  11  . metFORMIN (GLUCOPHAGE-XR) 500 MG 24 hr tablet Take 3 tablets (1,500 mg total) by mouth daily. 270 tablet 1  . Multiple Vitamin (MULTIVITAMIN) capsule Take 1 capsule by mouth daily.     No current facility-administered medications on file prior to visit.     PAST MEDICAL HISTORY: Past Medical History:  Diagnosis Date  . Anemia   . Back pain   . BPES syndrome   . Enlarged thyroid   . Infertility, female   . Obesity   . Palpitations   . Palpitations   . PCOS (polycystic ovarian syndrome)   . PONV (postoperative nausea and vomiting)   . Prediabetes   . Premature ovarian failure   . Vitamin B 12 deficiency   . Vitamin D  deficiency   . Wears eyeglasses     PAST SURGICAL HISTORY: Past Surgical History:  Procedure Laterality Date  . EYE SURGERY Bilateral   . NASAL SEPTOPLASTY W/ TURBINOPLASTY Bilateral 03/04/2017   Procedure: NASAL SEPTOPLASTY WITH BILATERAL INFERIOR TURBINATE REDUCTION;  Surgeon: Jerrell Belfast, MD;  Location: [place];  Service: ENT;  Laterality: Bilateral;  . NASAL SEPTUM SURGERY  03/04/2017  . TONSILLECTOMY AND ADENOIDECTOMY Bilateral 03/04/2017   Procedure: TONSILLECTOMY;  Surgeon: Jerrell Belfast, MD;  Location: [place];  Service: ENT;  Laterality: Bilateral;    SOCIAL  HISTORY: Social History   Tobacco Use  . Smoking status: Never Smoker  . Smokeless tobacco: Never Used  Substance Use Topics  . Alcohol use: Yes    Alcohol/week: 0.0 standard drinks    Comment: socially  . Drug use: No    FAMILY HISTORY: Family History  Problem Relation Age of Onset  . Hyperlipidemia Father   . Hypertension Father   . Heart disease Father   . Sudden death Father        Age 3  . Other Father        BPES  . Heart attack Father   . Other Other        BPES  . Other Other        BPES  . Hyperlipidemia Mother   . Hypertension Mother   . Diabetes Mother   . Anxiety disorder Mother   . Obesity Mother   . Other Brother        BPES  . Other Sister        BPES  . Other Paternal Uncle        BPES  . Other Cousin        BPES  . Other Cousin        BPES  . Heart attack Maternal Grandmother   . Heart disease Maternal Grandfather   . Heart attack Maternal Grandfather   . Lung cancer Maternal Grandfather   . Heart disease Paternal Grandfather heartr attack  . Heart attack Paternal Grandfather 84       MI  . Sudden death Paternal Grandfather    ROS: Review of Systems  Constitutional: Negative for weight loss.  Gastrointestinal: Negative for nausea and vomiting.  Musculoskeletal:       Negative for muscle weakness.  Endo/Heme/Allergies:       Negative for hypoglycemia.   PHYSICAL EXAM: Blood pressure 126/77, pulse 68, temperature 97.8 F (36.6 C), temperature source Oral, height 5\' 8"  (1.727 m), weight (!) 333 lb (151 kg), last menstrual period 08/17/2018, SpO2 97 %. Body mass index is 50.63 kg/m. Physical Exam Vitals signs reviewed.  Constitutional:      Appearance: Normal appearance. She is obese.  Cardiovascular:     Rate and Rhythm: Normal rate.     Pulses: Normal pulses.  Pulmonary:     Effort: Pulmonary effort is normal.     Breath sounds: Normal breath sounds.  Musculoskeletal: Normal range of motion.  Skin:    General: Skin is warm  and dry.  Neurological:     Mental Status: She is alert and oriented to person, place, and time.  Psychiatric:        Behavior: Behavior normal.   RECENT LABS AND TESTS: BMET    Component Value Date/Time   NA 141 07/17/2018 0915   K 4.6 07/17/2018 0915   CL 106 07/17/2018 0915   CO2 21 07/17/2018 0915   GLUCOSE 86 07/17/2018  0915   GLUCOSE 89 05/25/2017 0843   BUN 17 07/17/2018 0915   CREATININE 0.66 07/17/2018 0915   CALCIUM 9.3 07/17/2018 0915   GFRNONAA 119 07/17/2018 0915   GFRAA 137 07/17/2018 0915   Lab Results  Component Value Date   HGBA1C 5.1 07/17/2018   HGBA1C 5.3 02/16/2018   HGBA1C 5.5 11/02/2017   HGBA1C 5.7 05/25/2017   HGBA1C 5.1 11/29/2016   Lab Results  Component Value Date   INSULIN 15.0 07/17/2018   INSULIN 16.5 02/16/2018   INSULIN 35.6 (H) 11/02/2017   CBC    Component Value Date/Time   WBC 8.4 11/02/2017 0904   WBC 5.9 05/25/2017 0843   RBC 4.39 11/02/2017 0904   RBC 4.68 05/25/2017 0843   HGB 11.8 11/02/2017 0904   HCT 36.7 11/02/2017 0904   PLT 243.0 05/25/2017 0843   MCV 84 11/02/2017 0904   MCH 26.9 11/02/2017 0904   MCH 27.0 03/04/2017 1157   MCHC 32.2 11/02/2017 0904   MCHC 31.8 05/25/2017 0843   RDW 15.1 11/02/2017 0904   LYMPHSABS 2.4 11/02/2017 0904   MONOABS 0.6 05/25/2017 0843   EOSABS 0.3 11/02/2017 0904   BASOSABS 0.0 11/02/2017 0904   Iron/TIBC/Ferritin/ %Sat    Component Value Date/Time   IRON 30 11/02/2017 0904   TIBC 337 11/02/2017 0904   FERRITIN 28 11/02/2017 0904   IRONPCTSAT 9 (LL) 11/02/2017 0904   Lipid Panel     Component Value Date/Time   CHOL 168 07/17/2018 0915   TRIG 94 07/17/2018 0915   HDL 45 07/17/2018 0915   CHOLHDL 3 05/25/2017 0843   VLDL 20.6 05/25/2017 0843   LDLCALC 104 (H) 07/17/2018 0915   Hepatic Function Panel     Component Value Date/Time   PROT 6.4 07/17/2018 0915   ALBUMIN 4.1 07/17/2018 0915   AST 9 07/17/2018 0915   ALT 9 07/17/2018 0915   ALKPHOS 62 07/17/2018 0915     BILITOT 0.2 07/17/2018 0915      Component Value Date/Time   TSH 2.700 11/02/2017 0904   TSH 1.97 05/25/2017 0843   TSH 1.58 06/18/2016 0952    Ref. Range 07/17/2018 09:15  Vitamin D, 25-Hydroxy Latest Ref Range: 30.0 - 100.0 ng/mL 47.6   OBESITY BEHAVIORAL INTERVENTION VISIT  Today's visit was #15  Starting weight: 363 lbs Starting date: 11/02/2017 Today's weight: 333 lbs Today's date: 08/24/2018 Total lbs lost to date: 30    08/24/2018  Height 5\' 8"  (1.727 m)  Weight 333 lb (151 kg) (A)  BMI (Calculated) 50.64  BLOOD PRESSURE - SYSTOLIC 474  BLOOD PRESSURE - DIASTOLIC 77   Body Fat % 25.9 %  Total Body Water (lbs) 116.6 lbs   ASK: We discussed the diagnosis of obesity with Johnsie Cancel today and Caylin agreed to give [place] permission to discuss obesity behavioral modification therapy today.  ASSESS: Shandrika has the diagnosis of obesity and her BMI today is 50.64. Aaralynn is in the action stage of change.   ADVISE: Kymberli was educated on the multiple health risks of obesity as well as the benefit of weight loss to improve her health. She was advised of the need for long term treatment and the importance of lifestyle modifications to improve her current health and to decrease her risk of future health problems.  AGREE: Multiple dietary modification options and treatment options were discussed and  Gittel agreed to follow the recommendations documented in the above note.  ARRANGE: Takenya was educated on the importance of  frequent visits to treat obesity as outlined per CMS and USPSTF guidelines and agreed to schedule her next follow up appointment today.  Migdalia Dk, am acting as transcriptionist for Abby Potash, PA-C I, Abby Potash, PA-C have reviewed above note and agree with its content

## 2018-09-11 ENCOUNTER — Ambulatory Visit (INDEPENDENT_AMBULATORY_CARE_PROVIDER_SITE_OTHER): Payer: 59 | Admitting: Psychology

## 2018-09-11 DIAGNOSIS — F411 Generalized anxiety disorder: Secondary | ICD-10-CM | POA: Diagnosis not present

## 2018-09-12 ENCOUNTER — Ambulatory Visit (INDEPENDENT_AMBULATORY_CARE_PROVIDER_SITE_OTHER): Payer: 59 | Admitting: Physician Assistant

## 2018-09-12 ENCOUNTER — Other Ambulatory Visit: Payer: Self-pay

## 2018-09-12 ENCOUNTER — Encounter (INDEPENDENT_AMBULATORY_CARE_PROVIDER_SITE_OTHER): Payer: Self-pay

## 2018-09-12 ENCOUNTER — Encounter (INDEPENDENT_AMBULATORY_CARE_PROVIDER_SITE_OTHER): Payer: Self-pay | Admitting: Physician Assistant

## 2018-09-12 DIAGNOSIS — E559 Vitamin D deficiency, unspecified: Secondary | ICD-10-CM

## 2018-09-12 DIAGNOSIS — Z6841 Body Mass Index (BMI) 40.0 and over, adult: Secondary | ICD-10-CM

## 2018-09-13 NOTE — Progress Notes (Signed)
Office: [phone]  /  Fax: [phone] TeleHealth Visit:  [name] has consented to this TeleHealth visit today via telephone call. The patient is located at home, the provider is located at the [facility] office. The participants in this visit include the listed provider and patient and provider's assistant. Time spent on visit was 15 minutes  HPI:   Chief Complaint: OBESITY [name] is here to discuss her progress with her obesity treatment plan. She is on the Category 4 plan and is following her eating plan approximately 80 % of the time. She states she is doing cardio for 30 minutes 2-3 times per week. [name] reports that she weighs herself at home and has lost around 3 lbs according to her scale. She is having trouble finding the bread she needs for the plan.  We were unable to weight the patient today for this TeleHealth visit. She feels as if she has 3 lbs since her last visit. She has lost 30 to 33 lbs since starting treatment with [place].  Vitamin D Deficiency [name] has a diagnosis of vitamin D deficiency. She is on OTC Vit D 5,000 IU daily and denies nausea, vomiting or muscle weakness.  ASSESSMENT AND PLAN:  Vitamin D deficiency  Class 3 severe obesity with serious comorbidity and body mass index (BMI) of 50.0 to 59.9 in adult, unspecified obesity type ([place])  PLAN:  Vitamin D Deficiency [name] was informed that low vitamin D levels contributes to fatigue and are associated with obesity, breast, and colon cancer. [name] agrees to continue taking OTC Vit D and will follow up for routine testing of vitamin D, at least 2-3 times per year. She was informed of the risk of over-replacement of vitamin D and agrees to not increase her dose unless she discusses this with [place] first. [name] agrees to follow up with our clinic in 2 weeks.   I spent > than 50% of the 15 minute visit on counseling as documented in the note.  Obesity [name] is currently in the  action stage of change. As such, her goal is to continue with weight loss efforts She has agreed to follow the Category 4 plan [name] has been instructed to work up to a goal of 150 minutes of combined cardio and strengthening exercise per week for weight loss and overall health benefits. We discussed the following Behavioral Modification Strategies today: work on meal planning and easy cooking plans and keeping healthy foods in the home   [name] has agreed to follow up with our clinic in 2 weeks. She was informed of the importance of frequent follow up visits to maximize her success with intensive lifestyle modifications for her multiple health conditions.  ALLERGIES: No Known Allergies  MEDICATIONS: Current Outpatient Medications on File Prior to Visit  Medication Sig Dispense Refill  . Cholecalciferol 5000 units capsule Take 1 capsule (5,000 Units total) by mouth daily. With meal 30 capsule 11  . estradiol (CLIMARA - DOSED IN MG/24 HR) 0.1 mg/24hr patch Place 0.1 mg onto the skin. Apply and change patch twice weekly    . medroxyPROGESTERone (PROVERA) 5 MG tablet Take 1 tablet by mouth every 30 (thirty) days. Pt takes 12 tablets a month.  11  . metFORMIN (GLUCOPHAGE-XR) 500 MG 24 hr tablet Take 3 tablets (1,500 mg total) by mouth daily. 270 tablet 1  . Multiple Vitamin (MULTIVITAMIN) capsule Take 1 capsule by mouth daily.     No current facility-administered medications on file prior to  visit.     PAST MEDICAL HISTORY: Past Medical History:  Diagnosis Date  . Anemia   . Back pain   . BPES syndrome   . Enlarged thyroid   . Infertility, female   . Obesity   . Palpitations   . Palpitations   . PCOS (polycystic ovarian syndrome)   . PONV (postoperative nausea and vomiting)   . Prediabetes   . Premature ovarian failure   . Vitamin B 12 deficiency   . Vitamin D deficiency   . Wears eyeglasses     PAST SURGICAL HISTORY: Past Surgical History:  Procedure Laterality Date  .  EYE SURGERY Bilateral   . NASAL SEPTOPLASTY W/ TURBINOPLASTY Bilateral 03/04/2017   Procedure: NASAL SEPTOPLASTY WITH BILATERAL INFERIOR TURBINATE REDUCTION;  Surgeon: Jerrell Belfast, MD;  Location: [place];  Service: ENT;  Laterality: Bilateral;  . NASAL SEPTUM SURGERY  03/04/2017  . TONSILLECTOMY AND ADENOIDECTOMY Bilateral 03/04/2017   Procedure: TONSILLECTOMY;  Surgeon: Jerrell Belfast, MD;  Location: [place];  Service: ENT;  Laterality: Bilateral;    SOCIAL HISTORY: Social History   Tobacco Use  . Smoking status: Never Smoker  . Smokeless tobacco: Never Used  Substance Use Topics  . Alcohol use: Yes    Alcohol/week: 0.0 standard drinks    Comment: socially  . Drug use: No    FAMILY HISTORY: Family History  Problem Relation Age of Onset  . Hyperlipidemia Father   . Hypertension Father   . Heart disease Father   . Sudden death Father        Age 52  . Other Father        BPES  . Heart attack Father   . Other Other        BPES  . Other Other        BPES  . Hyperlipidemia Mother   . Hypertension Mother   . Diabetes Mother   . Anxiety disorder Mother   . Obesity Mother   . Other Brother        BPES  . Other Sister        BPES  . Other Paternal Uncle        BPES  . Other Cousin        BPES  . Other Cousin        BPES  . Heart attack Maternal Grandmother   . Heart disease Maternal Grandfather   . Heart attack Maternal Grandfather   . Lung cancer Maternal Grandfather   . Heart disease Paternal Grandfather heartr attack  . Heart attack Paternal Grandfather 80       MI  . Sudden death Paternal Grandfather     ROS: Review of Systems  Constitutional: Positive for weight loss.  Gastrointestinal: Negative for nausea and vomiting.  Musculoskeletal:       Negative muscle weakness    PHYSICAL EXAM: Pt in no acute distress  RECENT LABS AND TESTS: BMET    Component Value Date/Time   NA 141 07/17/2018 0915   K 4.6 07/17/2018 0915   CL 106 07/17/2018 0915    CO2 21 07/17/2018 0915   GLUCOSE 86 07/17/2018 0915   GLUCOSE 89 05/25/2017 0843   BUN 17 07/17/2018 0915   CREATININE 0.66 07/17/2018 0915   CALCIUM 9.3 07/17/2018 0915   GFRNONAA 119 07/17/2018 0915   GFRAA 137 07/17/2018 0915   Lab Results  Component Value Date   HGBA1C 5.1 07/17/2018   HGBA1C 5.3 02/16/2018   HGBA1C 5.5 11/02/2017  HGBA1C 5.7 05/25/2017   HGBA1C 5.1 11/29/2016   Lab Results  Component Value Date   INSULIN 15.0 07/17/2018   INSULIN 16.5 02/16/2018   INSULIN 35.6 (H) 11/02/2017   CBC    Component Value Date/Time   WBC 8.4 11/02/2017 0904   WBC 5.9 05/25/2017 0843   RBC 4.39 11/02/2017 0904   RBC 4.68 05/25/2017 0843   HGB 11.8 11/02/2017 0904   HCT 36.7 11/02/2017 0904   PLT 243.0 05/25/2017 0843   MCV 84 11/02/2017 0904   MCH 26.9 11/02/2017 0904   MCH 27.0 03/04/2017 1157   MCHC 32.2 11/02/2017 0904   MCHC 31.8 05/25/2017 0843   RDW 15.1 11/02/2017 0904   LYMPHSABS 2.4 11/02/2017 0904   MONOABS 0.6 05/25/2017 0843   EOSABS 0.3 11/02/2017 0904   BASOSABS 0.0 11/02/2017 0904   Iron/TIBC/Ferritin/ %Sat    Component Value Date/Time   IRON 30 11/02/2017 0904   TIBC 337 11/02/2017 0904   FERRITIN 28 11/02/2017 0904   IRONPCTSAT 9 (LL) 11/02/2017 0904   Lipid Panel     Component Value Date/Time   CHOL 168 07/17/2018 0915   TRIG 94 07/17/2018 0915   HDL 45 07/17/2018 0915   CHOLHDL 3 05/25/2017 0843   VLDL 20.6 05/25/2017 0843   LDLCALC 104 (H) 07/17/2018 0915   Hepatic Function Panel     Component Value Date/Time   PROT 6.4 07/17/2018 0915   ALBUMIN 4.1 07/17/2018 0915   AST 9 07/17/2018 0915   ALT 9 07/17/2018 0915   ALKPHOS 62 07/17/2018 0915   BILITOT 0.2 07/17/2018 0915      Component Value Date/Time   TSH 2.700 11/02/2017 0904   TSH 1.97 05/25/2017 0843   TSH 1.58 06/18/2016 0952      I, Trixie Dredge, am acting as transcriptionist for Abby Potash, PA-C I, Abby Potash, PA-C have reviewed above note and agree  with its content

## 2018-09-26 ENCOUNTER — Ambulatory Visit (INDEPENDENT_AMBULATORY_CARE_PROVIDER_SITE_OTHER): Payer: 59 | Admitting: Physician Assistant

## 2018-09-26 ENCOUNTER — Encounter (INDEPENDENT_AMBULATORY_CARE_PROVIDER_SITE_OTHER): Payer: Self-pay | Admitting: Physician Assistant

## 2018-09-26 ENCOUNTER — Other Ambulatory Visit: Payer: Self-pay

## 2018-09-26 DIAGNOSIS — Z6841 Body Mass Index (BMI) 40.0 and over, adult: Secondary | ICD-10-CM

## 2018-09-26 DIAGNOSIS — E7849 Other hyperlipidemia: Secondary | ICD-10-CM | POA: Diagnosis not present

## 2018-09-26 NOTE — Progress Notes (Signed)
Office: [phone]  /  Fax: [phone] TeleHealth Visit:  [name] has verbally consented to this TeleHealth visit today. The patient is located at home, the provider is located at the [employer] [facility] office. The participants in this visit include the listed provider and patient. The visit was conducted today via FaceTime.  HPI:   Chief Complaint: OBESITY [name] is here to discuss her progress with her obesity treatment plan. She is on the Category 4 plan and is following her eating plan approximately 50% of the time. She states she is doing cardio 30 minutes 2 times per week. [name] reports that she has been stress and boredom eating. 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 We were unable to weigh the patient today for this TeleHealth visit. She feels as if she has gained 1 lb since her last visit. She has lost 30 lbs since starting treatment with [place].  Hyperlipidemia [name] has hyperlipidemia and has been trying to improve her cholesterol levels with intensive lifestyle modification including a low saturated fat diet, exercise and weight loss. She is on no medication and denies any chest pain.  ASSESSMENT AND PLAN:  Other hyperlipidemia  Class 3 severe obesity with serious comorbidity and body mass index (BMI) of 50.0 to 59.9 in adult, unspecified obesity type ([place])  PLAN:  Hyperlipidemia [name] was informed of the American Heart Association Guidelines emphasizing intensive lifestyle modifications as the first line treatment for hyperlipidemia. We discussed many lifestyle modifications today in depth, and [name] will continue to work on decreasing saturated fats such as fatty red meat, butter and many fried foods. She will also increase vegetables and lean protein in her diet and continue to work on exercise and weight loss efforts.  Obesity [name] is currently in the action stage of change. As such,  her goal is to continue with weight loss efforts. She has agreed to follow the Category 4 plan. [name] has been instructed to work up to a goal of 150 minutes of combined cardio and strengthening exercise per week for weight loss and overall health benefits. We discussed the following Behavioral Modification Strategies today: work on meal planning, easy cooking plans, and keeping healthy foods in the home.  [name] has agreed to follow-up with our clinic in 2 weeks. She was informed of the importance of frequent follow-up visits to maximize her success with intensive lifestyle modifications for her multiple health conditions.  ALLERGIES: No Known Allergies  MEDICATIONS: Current Outpatient Medications on File Prior to Visit  Medication Sig Dispense Refill  . Cholecalciferol 5000 units capsule Take 1 capsule (5,000 Units total) by mouth daily. With meal 30 capsule 11  . estradiol (CLIMARA - DOSED IN MG/24 HR) 0.1 mg/24hr patch Place 0.1 mg onto the skin. Apply and change patch twice weekly    . medroxyPROGESTERone (PROVERA) 5 MG tablet Take 1 tablet by mouth every 30 (thirty) days. Pt takes 12 tablets a month.  11  . metFORMIN (GLUCOPHAGE-XR) 500 MG 24 hr tablet Take 3 tablets (1,500 mg total) by mouth daily. 270 tablet 1  . Multiple Vitamin (MULTIVITAMIN) capsule Take 1 capsule by mouth daily.     No current facility-administered medications on file prior to visit.     PAST MEDICAL HISTORY: Past Medical History:  Diagnosis Date  . Anemia   . Back pain   . BPES syndrome   . Enlarged thyroid   . Infertility, female   .  Obesity   . Palpitations   . Palpitations   . PCOS (polycystic ovarian syndrome)   . PONV (postoperative nausea and vomiting)   . Prediabetes   . Premature ovarian failure   . Vitamin B 12 deficiency   . Vitamin D deficiency   . Wears eyeglasses     PAST SURGICAL HISTORY: Past Surgical History:  Procedure Laterality Date  . EYE SURGERY Bilateral   . NASAL  SEPTOPLASTY W/ TURBINOPLASTY Bilateral 03/04/2017   Procedure: NASAL SEPTOPLASTY WITH BILATERAL INFERIOR TURBINATE REDUCTION;  Surgeon: Jerrell Belfast, MD;  Location: [facility];  Service: ENT;  Laterality: Bilateral;  . NASAL SEPTUM SURGERY  03/04/2017  . TONSILLECTOMY AND ADENOIDECTOMY Bilateral 03/04/2017   Procedure: TONSILLECTOMY;  Surgeon: Jerrell Belfast, MD;  Location: [facility];  Service: ENT;  Laterality: Bilateral;    SOCIAL HISTORY: Social History   Tobacco Use  . Smoking status: Never Smoker  . Smokeless tobacco: Never Used  Substance Use Topics  . Alcohol use: Yes    Alcohol/week: 0.0 standard drinks    Comment: socially  . Drug use: No    FAMILY HISTORY: Family History  Problem Relation Age of Onset  . Hyperlipidemia Father   . Hypertension Father   . Heart disease Father   . Sudden death Father        Age 57  . Other Father        BPES  . Heart attack Father   . Other Other        BPES  . Other Other        BPES  . Hyperlipidemia Mother   . Hypertension Mother   . Diabetes Mother   . Anxiety disorder Mother   . Obesity Mother   . Other Brother        BPES  . Other Sister        BPES  . Other Paternal Uncle        BPES  . Other Cousin        BPES  . Other Cousin        BPES  . Heart attack Maternal Grandmother   . Heart disease Maternal Grandfather   . Heart attack Maternal Grandfather   . Lung cancer Maternal Grandfather   . Heart disease Paternal Grandfather heartr attack  . Heart attack Paternal Grandfather 77       MI  . Sudden death Paternal Grandfather    ROS: Review of Systems  Cardiovascular: Negative for chest pain.   PHYSICAL EXAM: Pt in no acute distress  RECENT LABS AND TESTS: BMET    Component Value Date/Time   NA 141 07/17/2018 0915   K 4.6 07/17/2018 0915   CL 106 07/17/2018 0915   CO2 21 07/17/2018 0915   GLUCOSE 86 07/17/2018 0915   GLUCOSE 89 05/25/2017 0843   BUN 17 07/17/2018 0915   CREATININE 0.66 07/17/2018 0915    CALCIUM 9.3 07/17/2018 0915   GFRNONAA 119 07/17/2018 0915   GFRAA 137 07/17/2018 0915   Lab Results  Component Value Date   HGBA1C 5.1 07/17/2018   HGBA1C 5.3 02/16/2018   HGBA1C 5.5 11/02/2017   HGBA1C 5.7 05/25/2017   HGBA1C 5.1 11/29/2016   Lab Results  Component Value Date   INSULIN 15.0 07/17/2018   INSULIN 16.5 02/16/2018   INSULIN 35.6 (H) 11/02/2017   CBC    Component Value Date/Time   WBC 8.4 11/02/2017 0904   WBC 5.9 05/25/2017 0843   RBC 4.39 11/02/2017  0904   RBC 4.68 05/25/2017 0843   HGB 11.8 11/02/2017 0904   HCT 36.7 11/02/2017 0904   PLT 243.0 05/25/2017 0843   MCV 84 11/02/2017 0904   MCH 26.9 11/02/2017 0904   MCH 27.0 03/04/2017 1157   MCHC 32.2 11/02/2017 0904   MCHC 31.8 05/25/2017 0843   RDW 15.1 11/02/2017 0904   LYMPHSABS 2.4 11/02/2017 0904   MONOABS 0.6 05/25/2017 0843   EOSABS 0.3 11/02/2017 0904   BASOSABS 0.0 11/02/2017 0904   Iron/TIBC/Ferritin/ %Sat    Component Value Date/Time   IRON 30 11/02/2017 0904   TIBC 337 11/02/2017 0904   FERRITIN 28 11/02/2017 0904   IRONPCTSAT 9 (LL) 11/02/2017 0904   Lipid Panel     Component Value Date/Time   CHOL 168 07/17/2018 0915   TRIG 94 07/17/2018 0915   HDL 45 07/17/2018 0915   CHOLHDL 3 05/25/2017 0843   VLDL 20.6 05/25/2017 0843   LDLCALC 104 (H) 07/17/2018 0915   Hepatic Function Panel     Component Value Date/Time   PROT 6.4 07/17/2018 0915   ALBUMIN 4.1 07/17/2018 0915   AST 9 07/17/2018 0915   ALT 9 07/17/2018 0915   ALKPHOS 62 07/17/2018 0915   BILITOT 0.2 07/17/2018 0915      Component Value Date/Time   TSH 2.700 11/02/2017 0904   TSH 1.97 05/25/2017 0843   TSH 1.58 06/18/2016 0952   Results for YAMILETT, ANASTOS (MRN [id number]) as of 09/26/2018 16:27  Ref. Range 07/17/2018 09:15  Vitamin D, 25-Hydroxy Latest Ref Range: 30.0 - 100.0 ng/mL 47.6   I, Michaelene Song, am acting as Location manager for [employer], PA-C I, Abby Potash, PA-C have reviewed above  note and agree with its content

## 2018-10-09 ENCOUNTER — Encounter (INDEPENDENT_AMBULATORY_CARE_PROVIDER_SITE_OTHER): Payer: Self-pay | Admitting: Physician Assistant

## 2018-10-09 ENCOUNTER — Ambulatory Visit (INDEPENDENT_AMBULATORY_CARE_PROVIDER_SITE_OTHER): Payer: 59 | Admitting: Physician Assistant

## 2018-10-09 ENCOUNTER — Other Ambulatory Visit: Payer: Self-pay

## 2018-10-09 ENCOUNTER — Ambulatory Visit (INDEPENDENT_AMBULATORY_CARE_PROVIDER_SITE_OTHER): Payer: 59 | Admitting: Psychology

## 2018-10-09 DIAGNOSIS — Z6841 Body Mass Index (BMI) 40.0 and over, adult: Secondary | ICD-10-CM

## 2018-10-09 DIAGNOSIS — E559 Vitamin D deficiency, unspecified: Secondary | ICD-10-CM | POA: Diagnosis not present

## 2018-10-09 DIAGNOSIS — F411 Generalized anxiety disorder: Secondary | ICD-10-CM | POA: Diagnosis not present

## 2018-10-10 NOTE — Progress Notes (Signed)
Office: [phone]  /  Fax: [phone] TeleHealth Visit:  [name] has verbally consented to this TeleHealth visit today. The patient is located at home, the provider is located at the [employer] [facility] office. The participants in this visit include the listed provider and patient. The visit was conducted today via FaceTime.  HPI:   Chief Complaint: OBESITY [name] is here to discuss her progress with her obesity treatment plan. She is on the Category 4 plan and is following her eating plan approximately 60% of the time. She states she is doing cardio 30 minutes 1-2 times per week. [name] reports that she has lost some motivation to exercise and stay on her plan recently. She states she is stressed with work. We were unable to weigh the patient today for this TeleHealth visit. She feels as if she has gained 1 lb since her last visit. She has lost 30 lbs since starting treatment with [place].  Vitamin D deficiency [name] has a diagnosis of Vitamin D deficiency. She is currently taking OTC Vit D and denies nausea, vomiting or muscle weakness.  ASSESSMENT AND PLAN:  Vitamin D deficiency  Class 3 severe obesity with serious comorbidity and body mass index (BMI) of 50.0 to 59.9 in adult, unspecified obesity type ([name])  PLAN:  Vitamin D Deficiency [name] was informed that low Vitamin D levels contributes to fatigue and are associated with obesity, breast, and colon cancer. She agrees to continue taking OTC Vit D and will follow-up for routine testing of Vitamin D, at least 2-3 times per year. She was informed of the risk of over-replacement of Vitamin D and agrees to not increase her dose unless she discusses this with [place] first. [name] agrees to follow-up with our clinic in 3 weeks.  Obesity [name] is currently in the action stage of change. As such, her goal is to continue with weight loss efforts. She has agreed to follow the Category 4 plan. [name] has been  instructed to work up to a goal of 150 minutes of combined cardio and strengthening exercise per week for weight loss and overall health benefits. We discussed the following Behavioral Modification Strategies today: work on meal planning, easy cooking plans, and keeping healthy foods in the home.  [name] has agreed to follow-up with our clinic in 3 weeks. She was informed of the importance of frequent follow-up visits to maximize her success with intensive lifestyle modifications for her multiple health conditions.  ALLERGIES: No Known Allergies  MEDICATIONS: Current Outpatient Medications on File Prior to Visit  Medication Sig Dispense Refill   Cholecalciferol 5000 units capsule Take 1 capsule (5,000 Units total) by mouth daily. With meal 30 capsule 11   estradiol (CLIMARA - DOSED IN MG/24 HR) 0.1 mg/24hr patch Place 0.1 mg onto the skin. Apply and change patch twice weekly     medroxyPROGESTERone (PROVERA) 5 MG tablet Take 1 tablet by mouth every 30 (thirty) days. Pt takes 12 tablets a month.  11   metFORMIN (GLUCOPHAGE-XR) 500 MG 24 hr tablet Take 3 tablets (1,500 mg total) by mouth daily. 270 tablet 1   Multiple Vitamin (MULTIVITAMIN) capsule Take 1 capsule by mouth daily.     No current facility-administered medications on file prior to visit.     PAST MEDICAL HISTORY: Past Medical History:  Diagnosis Date   Anemia    Back pain    BPES syndrome    Enlarged thyroid    Infertility, female    Obesity  Palpitations    Palpitations    PCOS (polycystic ovarian syndrome)    PONV (postoperative nausea and vomiting)    Prediabetes    Premature ovarian failure    Vitamin B 12 deficiency    Vitamin D deficiency    Wears eyeglasses     PAST SURGICAL HISTORY: Past Surgical History:  Procedure Laterality Date   EYE SURGERY Bilateral    NASAL SEPTOPLASTY W/ TURBINOPLASTY Bilateral 03/04/2017   Procedure: NASAL SEPTOPLASTY WITH BILATERAL INFERIOR TURBINATE  REDUCTION;  Surgeon: Jerrell Belfast, MD;  Location: [facility];  Service: ENT;  Laterality: Bilateral;   NASAL SEPTUM SURGERY  03/04/2017   TONSILLECTOMY AND ADENOIDECTOMY Bilateral 03/04/2017   Procedure: TONSILLECTOMY;  Surgeon: Jerrell Belfast, MD;  Location: [facility];  Service: ENT;  Laterality: Bilateral;    SOCIAL HISTORY: Social History   Tobacco Use   Smoking status: Never Smoker   Smokeless tobacco: Never Used  Substance Use Topics   Alcohol use: Yes    Alcohol/week: 0.0 standard drinks    Comment: socially   Drug use: No    FAMILY HISTORY: Family History  Problem Relation Age of Onset   Hyperlipidemia Father    Hypertension Father    Heart disease Father    Sudden death Father        Age 20   Other Father        BPES   Heart attack Father    Other Other        BPES   Other Other        BPES   Hyperlipidemia Mother    Hypertension Mother    Diabetes Mother    Anxiety disorder Mother    Obesity Mother    Other Brother        BPES   Other Sister        BPES   Other Paternal Uncle        BPES   Other Cousin        BPES   Other Cousin        BPES   Heart attack Maternal Grandmother    Heart disease Maternal Grandfather    Heart attack Maternal Grandfather    Lung cancer Maternal Grandfather    Heart disease Paternal Grandfather heartr attack   Heart attack Paternal Grandfather 71       MI   Sudden death Paternal Grandfather    ROS: Review of Systems  Gastrointestinal: Negative for nausea and vomiting.  Musculoskeletal:       Negative for muscle weakness.   PHYSICAL EXAM: Pt in no acute distress  RECENT LABS AND TESTS: BMET    Component Value Date/Time   NA 141 07/17/2018 0915   K 4.6 07/17/2018 0915   CL 106 07/17/2018 0915   CO2 21 07/17/2018 0915   GLUCOSE 86 07/17/2018 0915   GLUCOSE 89 05/25/2017 0843   BUN 17 07/17/2018 0915   CREATININE 0.66 07/17/2018 0915   CALCIUM 9.3 07/17/2018 0915   GFRNONAA 119  07/17/2018 0915   GFRAA 137 07/17/2018 0915   Lab Results  Component Value Date   HGBA1C 5.1 07/17/2018   HGBA1C 5.3 02/16/2018   HGBA1C 5.5 11/02/2017   HGBA1C 5.7 05/25/2017   HGBA1C 5.1 11/29/2016   Lab Results  Component Value Date   INSULIN 15.0 07/17/2018   INSULIN 16.5 02/16/2018   INSULIN 35.6 (H) 11/02/2017   CBC    Component Value Date/Time   WBC 8.4 11/02/2017 0904  WBC 5.9 05/25/2017 0843   RBC 4.39 11/02/2017 0904   RBC 4.68 05/25/2017 0843   HGB 11.8 11/02/2017 0904   HCT 36.7 11/02/2017 0904   PLT 243.0 05/25/2017 0843   MCV 84 11/02/2017 0904   MCH 26.9 11/02/2017 0904   MCH 27.0 03/04/2017 1157   MCHC 32.2 11/02/2017 0904   MCHC 31.8 05/25/2017 0843   RDW 15.1 11/02/2017 0904   LYMPHSABS 2.4 11/02/2017 0904   MONOABS 0.6 05/25/2017 0843   EOSABS 0.3 11/02/2017 0904   BASOSABS 0.0 11/02/2017 0904   Iron/TIBC/Ferritin/ %Sat    Component Value Date/Time   IRON 30 11/02/2017 0904   TIBC 337 11/02/2017 0904   FERRITIN 28 11/02/2017 0904   IRONPCTSAT 9 (LL) 11/02/2017 0904   Lipid Panel     Component Value Date/Time   CHOL 168 07/17/2018 0915   TRIG 94 07/17/2018 0915   HDL 45 07/17/2018 0915   CHOLHDL 3 05/25/2017 0843   VLDL 20.6 05/25/2017 0843   LDLCALC 104 (H) 07/17/2018 0915   Hepatic Function Panel     Component Value Date/Time   PROT 6.4 07/17/2018 0915   ALBUMIN 4.1 07/17/2018 0915   AST 9 07/17/2018 0915   ALT 9 07/17/2018 0915   ALKPHOS 62 07/17/2018 0915   BILITOT 0.2 07/17/2018 0915      Component Value Date/Time   TSH 2.700 11/02/2017 0904   TSH 1.97 05/25/2017 0843   TSH 1.58 06/18/2016 0952   Results for YARISBEL, MIRANDA (MRN [id number]) as of 10/10/2018 09:18  Ref. Range 07/17/2018 09:15  Vitamin D, 25-Hydroxy Latest Ref Range: 30.0 - 100.0 ng/mL 47.6   I, Michaelene Song, am acting as Location manager for [employer], PA-C I, Abby Potash, PA-C have reviewed above note and agree with its content

## 2018-11-01 ENCOUNTER — Other Ambulatory Visit: Payer: Self-pay

## 2018-11-01 ENCOUNTER — Ambulatory Visit (INDEPENDENT_AMBULATORY_CARE_PROVIDER_SITE_OTHER): Payer: 59 | Admitting: Physician Assistant

## 2018-11-01 DIAGNOSIS — Z6841 Body Mass Index (BMI) 40.0 and over, adult: Secondary | ICD-10-CM | POA: Diagnosis not present

## 2018-11-01 DIAGNOSIS — E559 Vitamin D deficiency, unspecified: Secondary | ICD-10-CM | POA: Diagnosis not present

## 2018-11-01 NOTE — Progress Notes (Signed)
Office: [phone]  /  Fax: [phone] TeleHealth Visit:  [name] has verbally consented to this TeleHealth visit today. The patient is located at home, the provider is located at the [facility] office. The participants in this visit include the listed provider and patient and any and all parties involved. The visit was conducted today via WebEx.  HPI:   Chief Complaint: OBESITY [name] is here to discuss her progress with her obesity treatment plan. She is on the Category 4 plan and is following her eating plan approximately 80 % of the time. She states she is doing cardio exercise for 30 minutes 2 to 3 times per week. [name] reports that her motivation has improved over the last two weeks. She is drinking more water. [name] is concerned that she uses food as a reward when she loses weight. We were unable to weigh the patient today for this TeleHealth visit. She feels as if she has maintained weight since her last visit. She has lost 30 lbs since starting treatment with [place].  Vitamin D deficiency [name] has a diagnosis of vitamin D deficiency. [name] is currently taking vit D and she denies nausea, vomiting or muscle weakness.   ASSESSMENT AND PLAN:  Vitamin D deficiency  Class 3 severe obesity with serious comorbidity and body mass index (BMI) of 50.0 to 59.9 in adult, unspecified obesity type ([place])  PLAN:  Vitamin D Deficiency [name] was informed that low vitamin D levels contributes to fatigue and are associated with obesity, breast, and colon cancer. She will continue to take vitamin D and will follow up for routine testing of vitamin D, at least 2-3 times per year. She was informed of the risk of over-replacement of vitamin D and agrees to not increase her dose unless she discusses this with [place] first.  Obesity [name] is currently in the action stage of change. As such, her goal is to continue with weight loss efforts She has agreed to follow the  Category 4 plan [name] has been instructed to work up to a goal of 150 minutes of combined cardio and strengthening exercise per week for weight loss and overall health benefits. We discussed the following Behavioral Modification Strategies today: planning for success and work on meal planning and easy cooking plans  [name] has agreed to follow up with our clinic in 2 weeks. She was informed of the importance of frequent follow up visits to maximize her success with intensive lifestyle modifications for her multiple health conditions.  ALLERGIES: No Known Allergies  MEDICATIONS: Current Outpatient Medications on File Prior to Visit  Medication Sig Dispense Refill  . Cholecalciferol 5000 units capsule Take 1 capsule (5,000 Units total) by mouth daily. With meal 30 capsule 11  . estradiol (CLIMARA - DOSED IN MG/24 HR) 0.1 mg/24hr patch Place 0.1 mg onto the skin. Apply and change patch twice weekly    . medroxyPROGESTERone (PROVERA) 5 MG tablet Take 1 tablet by mouth every 30 (thirty) days. Pt takes 12 tablets a month.  11  . metFORMIN (GLUCOPHAGE-XR) 500 MG 24 hr tablet Take 3 tablets (1,500 mg total) by mouth daily. 270 tablet 1  . Multiple Vitamin (MULTIVITAMIN) capsule Take 1 capsule by mouth daily.     No current facility-administered medications on file prior to visit.     PAST MEDICAL HISTORY: Past Medical History:  Diagnosis Date  . Anemia   . Back pain   . BPES syndrome   . Enlarged thyroid   .  Infertility, female   . Obesity   . Palpitations   . Palpitations   . PCOS (polycystic ovarian syndrome)   . PONV (postoperative nausea and vomiting)   . Prediabetes   . Premature ovarian failure   . Vitamin B 12 deficiency   . Vitamin D deficiency   . Wears eyeglasses     PAST SURGICAL HISTORY: Past Surgical History:  Procedure Laterality Date  . EYE SURGERY Bilateral   . NASAL SEPTOPLASTY W/ TURBINOPLASTY Bilateral 03/04/2017   Procedure: NASAL SEPTOPLASTY WITH BILATERAL  INFERIOR TURBINATE REDUCTION;  Surgeon: Jerrell Belfast, MD;  Location: [facility];  Service: ENT;  Laterality: Bilateral;  . NASAL SEPTUM SURGERY  03/04/2017  . TONSILLECTOMY AND ADENOIDECTOMY Bilateral 03/04/2017   Procedure: TONSILLECTOMY;  Surgeon: Jerrell Belfast, MD;  Location: [facility];  Service: ENT;  Laterality: Bilateral;    SOCIAL HISTORY: Social History   Tobacco Use  . Smoking status: Never Smoker  . Smokeless tobacco: Never Used  Substance Use Topics  . Alcohol use: Yes    Alcohol/week: 0.0 standard drinks    Comment: socially  . Drug use: No    FAMILY HISTORY: Family History  Problem Relation Age of Onset  . Hyperlipidemia Father   . Hypertension Father   . Heart disease Father   . Sudden death Father        Age 66  . Other Father        BPES  . Heart attack Father   . Other Other        BPES  . Other Other        BPES  . Hyperlipidemia Mother   . Hypertension Mother   . Diabetes Mother   . Anxiety disorder Mother   . Obesity Mother   . Other Brother        BPES  . Other Sister        BPES  . Other Paternal Uncle        BPES  . Other Cousin        BPES  . Other Cousin        BPES  . Heart attack Maternal Grandmother   . Heart disease Maternal Grandfather   . Heart attack Maternal Grandfather   . Lung cancer Maternal Grandfather   . Heart disease Paternal Grandfather heartr attack  . Heart attack Paternal Grandfather 45       MI  . Sudden death Paternal Grandfather     ROS: Review of Systems  Constitutional: Negative for weight loss.  Gastrointestinal: Negative for nausea and vomiting.  Musculoskeletal:       Negative for muscle weakness    PHYSICAL EXAM: Pt in no acute distress  RECENT LABS AND TESTS: BMET    Component Value Date/Time   NA 141 07/17/2018 0915   K 4.6 07/17/2018 0915   CL 106 07/17/2018 0915   CO2 21 07/17/2018 0915   GLUCOSE 86 07/17/2018 0915   GLUCOSE 89 05/25/2017 0843   BUN 17 07/17/2018 0915   CREATININE  0.66 07/17/2018 0915   CALCIUM 9.3 07/17/2018 0915   GFRNONAA 119 07/17/2018 0915   GFRAA 137 07/17/2018 0915   Lab Results  Component Value Date   HGBA1C 5.1 07/17/2018   HGBA1C 5.3 02/16/2018   HGBA1C 5.5 11/02/2017   HGBA1C 5.7 05/25/2017   HGBA1C 5.1 11/29/2016   Lab Results  Component Value Date   INSULIN 15.0 07/17/2018   INSULIN 16.5 02/16/2018   INSULIN 35.6 (H) 11/02/2017  CBC    Component Value Date/Time   WBC 8.4 11/02/2017 0904   WBC 5.9 05/25/2017 0843   RBC 4.39 11/02/2017 0904   RBC 4.68 05/25/2017 0843   HGB 11.8 11/02/2017 0904   HCT 36.7 11/02/2017 0904   PLT 243.0 05/25/2017 0843   MCV 84 11/02/2017 0904   MCH 26.9 11/02/2017 0904   MCH 27.0 03/04/2017 1157   MCHC 32.2 11/02/2017 0904   MCHC 31.8 05/25/2017 0843   RDW 15.1 11/02/2017 0904   LYMPHSABS 2.4 11/02/2017 0904   MONOABS 0.6 05/25/2017 0843   EOSABS 0.3 11/02/2017 0904   BASOSABS 0.0 11/02/2017 0904   Iron/TIBC/Ferritin/ %Sat    Component Value Date/Time   IRON 30 11/02/2017 0904   TIBC 337 11/02/2017 0904   FERRITIN 28 11/02/2017 0904   IRONPCTSAT 9 (LL) 11/02/2017 0904   Lipid Panel     Component Value Date/Time   CHOL 168 07/17/2018 0915   TRIG 94 07/17/2018 0915   HDL 45 07/17/2018 0915   CHOLHDL 3 05/25/2017 0843   VLDL 20.6 05/25/2017 0843   LDLCALC 104 (H) 07/17/2018 0915   Hepatic Function Panel     Component Value Date/Time   PROT 6.4 07/17/2018 0915   ALBUMIN 4.1 07/17/2018 0915   AST 9 07/17/2018 0915   ALT 9 07/17/2018 0915   ALKPHOS 62 07/17/2018 0915   BILITOT 0.2 07/17/2018 0915      Component Value Date/Time   TSH 2.700 11/02/2017 0904   TSH 1.97 05/25/2017 0843   TSH 1.58 06/18/2016 0952    Results for KIMBLERY, DIOP (MRN [id number]) as of 11/01/2018 14:55  Ref. Range 07/17/2018 09:15  Vitamin D, 25-Hydroxy Latest Ref Range: 30.0 - 100.0 ng/mL 47.6    I, Doreene Nest, am acting as Location manager for Abby Potash, PA-C I, Abby Potash,  PA-C have reviewed above note and agree with its content

## 2018-11-07 ENCOUNTER — Ambulatory Visit (INDEPENDENT_AMBULATORY_CARE_PROVIDER_SITE_OTHER): Payer: 59 | Admitting: Psychology

## 2018-11-07 DIAGNOSIS — F411 Generalized anxiety disorder: Secondary | ICD-10-CM | POA: Diagnosis not present

## 2018-11-16 ENCOUNTER — Encounter (INDEPENDENT_AMBULATORY_CARE_PROVIDER_SITE_OTHER): Payer: Self-pay | Admitting: Physician Assistant

## 2018-11-16 ENCOUNTER — Ambulatory Visit (INDEPENDENT_AMBULATORY_CARE_PROVIDER_SITE_OTHER): Payer: 59 | Admitting: Physician Assistant

## 2018-11-16 ENCOUNTER — Other Ambulatory Visit: Payer: Self-pay

## 2018-11-16 DIAGNOSIS — E559 Vitamin D deficiency, unspecified: Secondary | ICD-10-CM | POA: Diagnosis not present

## 2018-11-16 DIAGNOSIS — Z6841 Body Mass Index (BMI) 40.0 and over, adult: Secondary | ICD-10-CM

## 2018-11-16 NOTE — Progress Notes (Signed)
Office: [phone]  /  Fax: [phone] TeleHealth Visit:  [name] has verbally consented to this TeleHealth visit today. The patient is located at home, the provider is located at the [facility] office. The participants in this visit include the listed provider and patient. The visit was conducted today via Webex (30 minutes).  HPI:   Chief Complaint: OBESITY [name] is here to discuss her progress with her obesity treatment plan. She is on the Category 4 plan and is following her eating plan approximately 85-90% of the time. She states she is doing leg exercises 15-20 minutes 2 times per week and cardio 30-35 minutes 1-2 times per week. [name] reports her weight today is 337 lbs. She has done well with the plan the last 2 weeks but is sometimes undereating her protein at lunch. She states she is working out more. We were unable to weigh the patient today for this TeleHealth visit. She reports her weight today is 337 lbs. She has lost 30 lbs since starting treatment with [place].  Vitamin D deficiency [name] has a diagnosis of Vitamin D deficiency. She is currently taking OTC Vit D and denies nausea, vomiting or muscle weakness.  ASSESSMENT AND PLAN:  Vitamin D deficiency  Class 3 severe obesity with serious comorbidity and body mass index (BMI) of 50.0 to 59.9 in adult, unspecified obesity type ([place])  PLAN:  Vitamin D Deficiency [name] was informed that low Vitamin D levels contributes to fatigue and are associated with obesity, breast, and colon cancer. She agrees to continue taking OTC Vit D and will follow-up for routine testing of Vitamin D, at least 2-3 times per year. She was informed of the risk of over-replacement of Vitamin D and agrees to not increase her dose unless she discusses this with [place] first. [name] agrees to follow-up with our clinic in 2 weeks.  Obesity [name] is currently in the action stage of change. As such, her goal is to continue  with weight loss efforts. She has agreed to follow the Category 4 plan. [name] has been instructed to work up to a goal of 150 minutes of combined cardio and strengthening exercise per week for weight loss and overall health benefits. We discussed the following Behavioral Modification Strategies today: increasing lean protein intake, work on meal planning and easy cooking plans.  [name] has agreed to follow-up with our clinic in 2 weeks. She was informed of the importance of frequent follow-up visits to maximize her success with intensive lifestyle modifications for her multiple health conditions.  ALLERGIES: No Known Allergies  MEDICATIONS: Current Outpatient Medications on File Prior to Visit  Medication Sig Dispense Refill  . Cholecalciferol 5000 units capsule Take 1 capsule (5,000 Units total) by mouth daily. With meal 30 capsule 11  . estradiol (CLIMARA - DOSED IN MG/24 HR) 0.1 mg/24hr patch Place 0.1 mg onto the skin. Apply and change patch twice weekly    . medroxyPROGESTERone (PROVERA) 5 MG tablet Take 1 tablet by mouth every 30 (thirty) days. Pt takes 12 tablets a month.  11  . metFORMIN (GLUCOPHAGE-XR) 500 MG 24 hr tablet Take 3 tablets (1,500 mg total) by mouth daily. 270 tablet 1  . Multiple Vitamin (MULTIVITAMIN) capsule Take 1 capsule by mouth daily.     No current facility-administered medications on file prior to visit.     PAST MEDICAL HISTORY: Past Medical History:  Diagnosis Date  . Anemia   . Back pain   . BPES syndrome   .  Enlarged thyroid   . Infertility, female   . Obesity   . Palpitations   . Palpitations   . PCOS (polycystic ovarian syndrome)   . PONV (postoperative nausea and vomiting)   . Prediabetes   . Premature ovarian failure   . Vitamin B 12 deficiency   . Vitamin D deficiency   . Wears eyeglasses     PAST SURGICAL HISTORY: Past Surgical History:  Procedure Laterality Date  . EYE SURGERY Bilateral   . NASAL SEPTOPLASTY W/ TURBINOPLASTY  Bilateral 03/04/2017   Procedure: NASAL SEPTOPLASTY WITH BILATERAL INFERIOR TURBINATE REDUCTION;  Surgeon: Jerrell Belfast, MD;  Location: [facility];  Service: ENT;  Laterality: Bilateral;  . NASAL SEPTUM SURGERY  03/04/2017  . TONSILLECTOMY AND ADENOIDECTOMY Bilateral 03/04/2017   Procedure: TONSILLECTOMY;  Surgeon: Jerrell Belfast, MD;  Location: [facility];  Service: ENT;  Laterality: Bilateral;    SOCIAL HISTORY: Social History   Tobacco Use  . Smoking status: Never Smoker  . Smokeless tobacco: Never Used  Substance Use Topics  . Alcohol use: Yes    Alcohol/week: 0.0 standard drinks    Comment: socially  . Drug use: No    FAMILY HISTORY: Family History  Problem Relation Age of Onset  . Hyperlipidemia Father   . Hypertension Father   . Heart disease Father   . Sudden death Father        Age 17  . Other Father        BPES  . Heart attack Father   . Other Other        BPES  . Other Other        BPES  . Hyperlipidemia Mother   . Hypertension Mother   . Diabetes Mother   . Anxiety disorder Mother   . Obesity Mother   . Other Brother        BPES  . Other Sister        BPES  . Other Paternal Uncle        BPES  . Other Cousin        BPES  . Other Cousin        BPES  . Heart attack Maternal Grandmother   . Heart disease Maternal Grandfather   . Heart attack Maternal Grandfather   . Lung cancer Maternal Grandfather   . Heart disease Paternal Grandfather heartr attack  . Heart attack Paternal Grandfather 28       MI  . Sudden death Paternal Grandfather    ROS: Review of Systems  Gastrointestinal: Negative for nausea and vomiting.  Musculoskeletal:       Negative for muscle weakness.   PHYSICAL EXAM: Pt in no acute distress  RECENT LABS AND TESTS: BMET    Component Value Date/Time   NA 141 07/17/2018 0915   K 4.6 07/17/2018 0915   CL 106 07/17/2018 0915   CO2 21 07/17/2018 0915   GLUCOSE 86 07/17/2018 0915   GLUCOSE 89 05/25/2017 0843   BUN 17 07/17/2018  0915   CREATININE 0.66 07/17/2018 0915   CALCIUM 9.3 07/17/2018 0915   GFRNONAA 119 07/17/2018 0915   GFRAA 137 07/17/2018 0915   Lab Results  Component Value Date   HGBA1C 5.1 07/17/2018   HGBA1C 5.3 02/16/2018   HGBA1C 5.5 11/02/2017   HGBA1C 5.7 05/25/2017   HGBA1C 5.1 11/29/2016   Lab Results  Component Value Date   INSULIN 15.0 07/17/2018   INSULIN 16.5 02/16/2018   INSULIN 35.6 (H) 11/02/2017   CBC  Component Value Date/Time   WBC 8.4 11/02/2017 0904   WBC 5.9 05/25/2017 0843   RBC 4.39 11/02/2017 0904   RBC 4.68 05/25/2017 0843   HGB 11.8 11/02/2017 0904   HCT 36.7 11/02/2017 0904   PLT 243.0 05/25/2017 0843   MCV 84 11/02/2017 0904   MCH 26.9 11/02/2017 0904   MCH 27.0 03/04/2017 1157   MCHC 32.2 11/02/2017 0904   MCHC 31.8 05/25/2017 0843   RDW 15.1 11/02/2017 0904   LYMPHSABS 2.4 11/02/2017 0904   MONOABS 0.6 05/25/2017 0843   EOSABS 0.3 11/02/2017 0904   BASOSABS 0.0 11/02/2017 0904   Iron/TIBC/Ferritin/ %Sat    Component Value Date/Time   IRON 30 11/02/2017 0904   TIBC 337 11/02/2017 0904   FERRITIN 28 11/02/2017 0904   IRONPCTSAT 9 (LL) 11/02/2017 0904   Lipid Panel     Component Value Date/Time   CHOL 168 07/17/2018 0915   TRIG 94 07/17/2018 0915   HDL 45 07/17/2018 0915   CHOLHDL 3 05/25/2017 0843   VLDL 20.6 05/25/2017 0843   LDLCALC 104 (H) 07/17/2018 0915   Hepatic Function Panel     Component Value Date/Time   PROT 6.4 07/17/2018 0915   ALBUMIN 4.1 07/17/2018 0915   AST 9 07/17/2018 0915   ALT 9 07/17/2018 0915   ALKPHOS 62 07/17/2018 0915   BILITOT 0.2 07/17/2018 0915      Component Value Date/Time   TSH 2.700 11/02/2017 0904   TSH 1.97 05/25/2017 0843   TSH 1.58 06/18/2016 0952   Results for BOSTON, CATARINO (MRN [id number]) as of 11/16/2018 11:19  Ref. Range 07/17/2018 09:15  Vitamin D, 25-Hydroxy Latest Ref Range: 30.0 - 100.0 ng/mL 47.6    I, Michaelene Song, am acting as Location manager for [employer], PA-C I,  Abby Potash, PA-C have reviewed above note and agree with its content

## 2018-11-29 ENCOUNTER — Ambulatory Visit (INDEPENDENT_AMBULATORY_CARE_PROVIDER_SITE_OTHER): Payer: 59 | Admitting: Physician Assistant

## 2018-11-29 ENCOUNTER — Other Ambulatory Visit: Payer: Self-pay

## 2018-11-29 ENCOUNTER — Encounter (INDEPENDENT_AMBULATORY_CARE_PROVIDER_SITE_OTHER): Payer: Self-pay | Admitting: Physician Assistant

## 2018-11-29 DIAGNOSIS — Z6841 Body Mass Index (BMI) 40.0 and over, adult: Secondary | ICD-10-CM | POA: Diagnosis not present

## 2018-11-29 DIAGNOSIS — R6889 Other general symptoms and signs: Secondary | ICD-10-CM

## 2018-11-29 DIAGNOSIS — E66813 Obesity, class 3: Secondary | ICD-10-CM

## 2018-11-29 DIAGNOSIS — E559 Vitamin D deficiency, unspecified: Secondary | ICD-10-CM

## 2018-11-30 ENCOUNTER — Other Ambulatory Visit: Payer: Self-pay

## 2018-11-30 ENCOUNTER — Ambulatory Visit: Payer: 59 | Admitting: Family Medicine

## 2018-11-30 ENCOUNTER — Encounter: Payer: Self-pay | Admitting: Family Medicine

## 2018-11-30 VITALS — BP 112/69 | HR 72 | Temp 97.9°F | Resp 18 | Ht 68.0 in | Wt 337.2 lb

## 2018-11-30 DIAGNOSIS — E282 Polycystic ovarian syndrome: Secondary | ICD-10-CM | POA: Diagnosis not present

## 2018-11-30 DIAGNOSIS — E010 Iodine-deficiency related diffuse (endemic) goiter: Secondary | ICD-10-CM | POA: Diagnosis not present

## 2018-11-30 DIAGNOSIS — R5383 Other fatigue: Secondary | ICD-10-CM | POA: Diagnosis not present

## 2018-11-30 DIAGNOSIS — Q103 Other congenital malformations of eyelid: Secondary | ICD-10-CM | POA: Diagnosis not present

## 2018-11-30 DIAGNOSIS — R7303 Prediabetes: Secondary | ICD-10-CM | POA: Diagnosis not present

## 2018-11-30 LAB — TSH: TSH: 1.41 u[IU]/mL (ref 0.35–4.50)

## 2018-11-30 LAB — T4, FREE: Free T4: 1.02 ng/dL (ref 0.60–1.60)

## 2018-11-30 LAB — HEMOGLOBIN A1C: Hgb A1c MFr Bld: 5.5 % (ref 4.6–6.5)

## 2018-11-30 LAB — T3, FREE: T3, Free: 3.2 pg/mL (ref 2.3–4.2)

## 2018-11-30 MED ORDER — METFORMIN HCL ER 500 MG PO TB24: 1000.0000 mg | ORAL_TABLET | Freq: Every day | ORAL | 1 refills | Status: DC

## 2018-11-30 NOTE — Patient Instructions (Addendum)
It was great to see you today! Reduce your metformin to 1000 mg a day. I have refilled this for you. If your a1c comes back < 6 again, we can reduce more.   We will call you with lab results once we receive them.   Remember to restart your B12.  They will call you to schedule your thyroid US.   Follow up in 6 months>> make appt as CPE.

## 2018-11-30 NOTE — Progress Notes (Signed)
[name] , [birth year], 33 y.o., female MRN: [id number] Patient Care Team    Relationship Specialty Notifications Start End  Ma Hillock, DO PCP - General Family Medicine  04/25/15   Servando Salina, MD Consulting Physician Obstetrics and Gynecology  06/18/16   Wellington Hampshire, MD Consulting Physician Cardiology  06/18/16   Starlyn Skeans, MD Consulting Physician Family Medicine  05/26/18     Chief Complaint  Patient presents with  . Thyroid Problem    Pt feels her thyroid may be larger and would like a repeat ultra sound.      Subjective:  prediabetes/morbid obesity/PCOS/BPES She is doing well. Reports compliance with  metformin 1500 mg QD. She is prescribed estrogen patches through gyn and using progesterone.  She has been dieting and exercising. She is following with Dr. Trixie Rude for her obesity.  He is looking forward to her wedding in September and hoping that coronavirus does not cause her to cancel. Fatigue/enlarged thyroid: Patient reports she has noticed that she has had increased fatigue and cold intolerance.  She also feels her thyroid is more enlarged than the past.  She has had chronic thyromegaly on ultrasound in 2016.  She has started to notice some compression symptoms.  She reports while wearing a mask at work and it touches her neck she feels more sensitive.  She has noticed when looking down and her neck is flexed she has compression symptoms.  She denies any swallowing difficulties.  She also feels that her thyroid visually looks larger than it has in the past. Thyroid ultrasound 2016 CLINICAL DATA:  Thyromegaly. IMPRESSION: Nonspecific mild heterogeneity and diffuse enlargement of the thyroid gland without discrete nodule or mass.  Depression screen [facility] 2/9 11/30/2018 11/02/2017 05/25/2017 06/18/2016 06/28/2014  Decreased Interest 0 3 0 0 0  Down, Depressed, Hopeless 0 2 0 0 0  PHQ - 2 Score 0 5 0 0 0  Altered sleeping - 2 - - -  Tired, decreased  energy - 3 - - -  Change in appetite - 3 - - -  Feeling bad or failure about yourself  - 3 - - -  Trouble concentrating - 1 - - -  Moving slowly or fidgety/restless - 1 - - -  Suicidal thoughts - 0 - - -  PHQ-9 Score - 18 - - -  Difficult doing work/chores - Somewhat difficult - - -    No Known Allergies Social History   Tobacco Use  . Smoking status: Never Smoker  . Smokeless tobacco: Never Used  Substance Use Topics  . Alcohol use: Yes    Alcohol/week: 0.0 standard drinks    Comment: socially   Past Medical History:  Diagnosis Date  . Anemia   . Back pain   . BPES syndrome   . Enlarged thyroid   . Infertility, female   . Obesity   . Palpitations   . Palpitations   . PCOS (polycystic ovarian syndrome)   . PONV (postoperative nausea and vomiting)   . Prediabetes   . Premature ovarian failure   . Vitamin B 12 deficiency   . Vitamin D deficiency   . Wears eyeglasses    Past Surgical History:  Procedure Laterality Date  . EYE SURGERY Bilateral   . NASAL SEPTOPLASTY W/ TURBINOPLASTY Bilateral 03/04/2017   Procedure: NASAL SEPTOPLASTY WITH BILATERAL INFERIOR TURBINATE REDUCTION;  Surgeon: Jerrell Belfast, MD;  Location: [facility];  Service: ENT;  Laterality: Bilateral;  . NASAL SEPTUM SURGERY  03/04/2017  . TONSILLECTOMY AND ADENOIDECTOMY Bilateral 03/04/2017   Procedure: TONSILLECTOMY;  Surgeon: Jerrell Belfast, MD;  Location: [facility] OR;  Service: ENT;  Laterality: Bilateral;   Family History  Problem Relation Age of Onset  . Hyperlipidemia Father   . Hypertension Father   . Heart disease Father   . Sudden death Father        Age 60  . Other Father        BPES  . Heart attack Father   . Other Other        BPES  . Other Other        BPES  . Hyperlipidemia Mother   . Hypertension Mother   . Diabetes Mother   . Anxiety disorder Mother   . Obesity Mother   . Other Brother        BPES  . Other Sister        BPES  . Other Paternal Uncle        BPES  . Other  Cousin        BPES  . Other Cousin        BPES  . Heart attack Maternal Grandmother   . Heart disease Maternal Grandfather   . Heart attack Maternal Grandfather   . Lung cancer Maternal Grandfather   . Heart disease Paternal Grandfather heartr attack  . Heart attack Paternal Grandfather 61       MI  . Sudden death Paternal Grandfather    Allergies as of 11/30/2018   No Known Allergies     Medication List       Accurate as of November 30, 2018 11:39 AM. If you have any questions, ask your nurse or doctor.        Cholecalciferol 125 MCG (5000 UT) capsule Take 1 capsule (5,000 Units total) by mouth daily. With meal   estradiol 0.1 mg/24hr patch Commonly known as: CLIMARA - Dosed in mg/24 hr Place 0.1 mg onto the skin. Apply and change patch twice weekly   medroxyPROGESTERone 5 MG tablet Commonly known as: PROVERA Take 1 tablet by mouth every 30 (thirty) days. Pt takes 12 tablets a month.   metFORMIN 500 MG 24 hr tablet Commonly known as: GLUCOPHAGE-XR Take 3 tablets (1,500 mg total) by mouth daily.   multivitamin capsule Take 1 capsule by mouth daily.   Premarin 0.625 MG tablet Generic drug: estrogens (conjugated)       All past medical history, surgical history, allergies, family history, immunizations andmedications were updated in the EMR today and reviewed under the history and medication portions of their EMR.     ROS: Negative, with the exception of above mentioned in HPI   Objective:  BP 112/69 (BP Location: Left Arm, Patient Position: Sitting, Cuff Size: Large)   Pulse 72   Temp 97.9 F (36.6 C) (Oral)   Resp 18   Ht 5\' 8"  (1.727 m)   Wt (!) 337 lb 4 oz (153 kg)   LMP 11/14/2018 (Exact Date)   SpO2 100%   BMI 51.28 kg/m  Body mass index is 51.28 kg/m. Gen: Afebrile. No acute distress.  HENT: AT. [place].  MMM. Eyes:Pupils Equal Round Reactive to light, Extraocular movements intact,  Conjunctiva without redness, discharge or icterus.  Neck/lymp/endocrine: Supple, no lymphadenopathy, diffusely large thyroid right greater than left  CV: RRR, no edema, +2/4 P posterior tibialis pulses Chest: CTAB, no wheeze or crackles Skin: No rashes, purpura or petechiae.  Neuro: No normal gait. PERLA. EOMi. Alert.  Oriented.  Psych: Normal affect, dress and demeanor. Normal speech. Normal thought content and judgment.  No exam data present No results found. No results found for this or any previous visit (from the past 24 hour(s)).  Assessment/Plan: ANADALAY MACDONELL is a 33 y.o. female present for OV for  Prediabetes/Morbid obesity (HCC)/PCOS (polycystic ovarian syndrome)/BPES - 5.8--> 5.1--> 5.7--> 5.5--> 5.3>>5.1 (completed 07/17/2018)>>a1c ordered today - metformin use mostly for PCOS. - Continued metformin >> reduce dose to 1000 mg and if a1c collected today is still < 6 will decrease again to 500 mg QD.  - She is seeing weight management clinic. She has made dietary changes.  - continue f/u with GYN, tolerating estrogen patches and feels improved. - Routine exercise > 150 min a week encouraged  - F/U 6 mos.   Thyromegaly/fatigue -She has had chronic thyroid enlargement, however it does appear to be larger on exam today.  She is having some mild compression symptoms which are new.  Will retest her thyroid panel today and obtain a new thyroid ultrasound. -Restart B12.   Vitamin D was recently checked and normal-continue supplementation. - Thyroid antibodies - Thyroid Peroxidase Antibodies (TPO) (REFL) - TSH - T3, free - T4, free - US THYROID; Future  Follow-up around 6 months CPE.   Reviewed expectations re: course of current medical issues.  Discussed self-management of symptoms.  Outlined signs and symptoms indicating need for more acute intervention.  Patient verbalized understanding and all questions were answered.  Patient received an After-Visit Summary.   Orders Placed This Encounter  Procedures  . US  THYROID  . Thyroid antibodies  . Thyroid Peroxidase Antibodies (TPO) (REFL)  . TSH  . T3, free  . T4, free  . Hemoglobin A1c   > 25 minutes spent with patient, >50% of time spent face to face    Note is dictated utilizing voice recognition software. Although note has been proof read prior to signing, occasional typographical errors still can be missed. If any questions arise, please do not hesitate to call for verification.   electronically signed by:  Howard Pouch, DO  [place]

## 2018-11-30 NOTE — Progress Notes (Signed)
Office: [phone]  /  Fax: [phone] TeleHealth Visit:  [name] has verbally consented to this TeleHealth visit today. The patient is located at home, the provider is located at the [employer] [facility] office. The participants in this visit include the listed provider and patient. The visit was conducted today via Webex.  HPI:   Chief Complaint: OBESITY [name] is here to discuss her progress with her obesity treatment plan. She is on the Category 4 plan and journaling and is following her eating plan approximately 95 % of the time. She states she is doing cardio 20 minutes 5 times per week. [name]'s recent weight [place] 340 pounds. She is frustrated with the weight gain since she does follow the plan closely and has increased exercise.  We were unable to weigh the patient today for this TeleHealth visit. She feels as if she has gained weight since her last visit. She has lost 30 lbs since starting treatment with [place].  Cold Intolerance [name]'s last thyroid panel was normal. She is not as cold intolerant and feels like her thyroid may be enlarged.  Vitamin D deficiency [name] has a diagnosis of vitamin D deficiency. She is currently taking OTC vit D and denies nausea, vomiting, or muscle weakness.  ASSESSMENT AND PLAN:  Cold intolerance  Vitamin D deficiency  Class 3 severe obesity with serious comorbidity and body mass index (BMI) of 50.0 to 59.9 in adult, unspecified obesity type ([name])  PLAN:  Cold Intolerance [name] will follow up with her PCP and follow up with [place] in 2 weeks as directed.  Vitamin D Deficiency [name] was informed that low vitamin D levels contribute to fatigue and are associated with obesity, breast, and colon cancer. [name] agrees to continue to take OTC Vit D and will follow up for routine testing of vitamin D, at least 2-3 times per year. She was informed of the risk of over-replacement of vitamin D and agrees to not increase her dose  unless she discusses this with [place] first. [name] agrees to follow up in 2 weeks as directed.  Obesity [name] is currently in the action stage of change. As such, her goal is to continue with weight loss efforts. She has agreed to keep a food journal with 1800 calories and 115 grams of protein.  [name] has been instructed to work up to a goal of 150 minutes of combined cardio and strengthening exercise per week for weight loss and overall health benefits. We discussed the following Behavioral Modification Strategies today: planning for success and keeping a strict food journal.  [name] has agreed to follow up with our clinic in 2 weeks. She was informed of the importance of frequent follow up visits to maximize her success with intensive lifestyle modifications for her multiple health conditions.  ALLERGIES: No Known Allergies  MEDICATIONS: Current Outpatient Medications on File Prior to Visit  Medication Sig Dispense Refill  . Cholecalciferol 5000 units capsule Take 1 capsule (5,000 Units total) by mouth daily. With meal 30 capsule 11  . estradiol (CLIMARA - DOSED IN MG/24 HR) 0.1 mg/24hr patch Place 0.1 mg onto the skin. Apply and change patch twice weekly    . medroxyPROGESTERone (PROVERA) 5 MG tablet Take 1 tablet by mouth every 30 (thirty) days. Pt takes 12 tablets a month.  11  . Multiple Vitamin (MULTIVITAMIN) capsule Take 1 capsule by mouth daily.     No current facility-administered medications on file prior to visit.     PAST MEDICAL  HISTORY: Past Medical History:  Diagnosis Date  . Anemia   . Back pain   . BPES syndrome   . Enlarged thyroid   . Infertility, female   . Obesity   . Palpitations   . Palpitations   . PCOS (polycystic ovarian syndrome)   . PONV (postoperative nausea and vomiting)   . Prediabetes   . Premature ovarian failure   . Vitamin B 12 deficiency   . Vitamin D deficiency   . Wears eyeglasses     PAST SURGICAL HISTORY: Past Surgical History:   Procedure Laterality Date  . EYE SURGERY Bilateral   . NASAL SEPTOPLASTY W/ TURBINOPLASTY Bilateral 03/04/2017   Procedure: NASAL SEPTOPLASTY WITH BILATERAL INFERIOR TURBINATE REDUCTION;  Surgeon: Jerrell Belfast, MD;  Location: [facility];  Service: ENT;  Laterality: Bilateral;  . NASAL SEPTUM SURGERY  03/04/2017  . TONSILLECTOMY AND ADENOIDECTOMY Bilateral 03/04/2017   Procedure: TONSILLECTOMY;  Surgeon: Jerrell Belfast, MD;  Location: [facility];  Service: ENT;  Laterality: Bilateral;    SOCIAL HISTORY: Social History   Tobacco Use  . Smoking status: Never Smoker  . Smokeless tobacco: Never Used  Substance Use Topics  . Alcohol use: Yes    Alcohol/week: 0.0 standard drinks    Comment: socially  . Drug use: No    FAMILY HISTORY: Family History  Problem Relation Age of Onset  . Hyperlipidemia Father   . Hypertension Father   . Heart disease Father   . Sudden death Father        Age 12  . Other Father        BPES  . Heart attack Father   . Other Other        BPES  . Other Other        BPES  . Hyperlipidemia Mother   . Hypertension Mother   . Diabetes Mother   . Anxiety disorder Mother   . Obesity Mother   . Other Brother        BPES  . Other Sister        BPES  . Other Paternal Uncle        BPES  . Other Cousin        BPES  . Other Cousin        BPES  . Heart attack Maternal Grandmother   . Heart disease Maternal Grandfather   . Heart attack Maternal Grandfather   . Lung cancer Maternal Grandfather   . Heart disease Paternal Grandfather heartr attack  . Heart attack Paternal Grandfather 2       MI  . Sudden death Paternal Grandfather     ROS: Review of Systems  Gastrointestinal: Negative for nausea and vomiting.  Musculoskeletal:       Negative for muscle weakness.  Endo/Heme/Allergies:       Positive for cold intolerance.    PHYSICAL EXAM: Pt in no acute distress  RECENT LABS AND TESTS: BMET    Component Value Date/Time   NA 141 07/17/2018 0915    K 4.6 07/17/2018 0915   CL 106 07/17/2018 0915   CO2 21 07/17/2018 0915   GLUCOSE 86 07/17/2018 0915   GLUCOSE 89 05/25/2017 0843   BUN 17 07/17/2018 0915   CREATININE 0.66 07/17/2018 0915   CALCIUM 9.3 07/17/2018 0915   GFRNONAA 119 07/17/2018 0915   GFRAA 137 07/17/2018 0915   Lab Results  Component Value Date   HGBA1C 5.1 07/17/2018   HGBA1C 5.3 02/16/2018   HGBA1C 5.5 11/02/2017  HGBA1C 5.7 05/25/2017   HGBA1C 5.1 11/29/2016   Lab Results  Component Value Date   INSULIN 15.0 07/17/2018   INSULIN 16.5 02/16/2018   INSULIN 35.6 (H) 11/02/2017   CBC    Component Value Date/Time   WBC 8.4 11/02/2017 0904   WBC 5.9 05/25/2017 0843   RBC 4.39 11/02/2017 0904   RBC 4.68 05/25/2017 0843   HGB 11.8 11/02/2017 0904   HCT 36.7 11/02/2017 0904   PLT 243.0 05/25/2017 0843   MCV 84 11/02/2017 0904   MCH 26.9 11/02/2017 0904   MCH 27.0 03/04/2017 1157   MCHC 32.2 11/02/2017 0904   MCHC 31.8 05/25/2017 0843   RDW 15.1 11/02/2017 0904   LYMPHSABS 2.4 11/02/2017 0904   MONOABS 0.6 05/25/2017 0843   EOSABS 0.3 11/02/2017 0904   BASOSABS 0.0 11/02/2017 0904   Iron/TIBC/Ferritin/ %Sat    Component Value Date/Time   IRON 30 11/02/2017 0904   TIBC 337 11/02/2017 0904   FERRITIN 28 11/02/2017 0904   IRONPCTSAT 9 (LL) 11/02/2017 0904   Lipid Panel     Component Value Date/Time   CHOL 168 07/17/2018 0915   TRIG 94 07/17/2018 0915   HDL 45 07/17/2018 0915   CHOLHDL 3 05/25/2017 0843   VLDL 20.6 05/25/2017 0843   LDLCALC 104 (H) 07/17/2018 0915   Hepatic Function Panel     Component Value Date/Time   PROT 6.4 07/17/2018 0915   ALBUMIN 4.1 07/17/2018 0915   AST 9 07/17/2018 0915   ALT 9 07/17/2018 0915   ALKPHOS 62 07/17/2018 0915   BILITOT 0.2 07/17/2018 0915      Component Value Date/Time   TSH 2.700 11/02/2017 0904   TSH 1.97 05/25/2017 0843   TSH 1.58 06/18/2016 0952   Results for YANNI, RUBERG (MRN [id number]) as of 11/30/2018 13:23  Ref. Range  07/17/2018 09:15  Vitamin D, 25-Hydroxy Latest Ref Range: 30.0 - 100.0 ng/mL 47.6    I, Marcille Blanco, CMA, am acting as transcriptionist for Abby Potash, PA-C I, Abby Potash, PA-C have reviewed above note and agree with its content

## 2018-12-01 LAB — THYROID ANTIBODIES
Thyroglobulin Ab: <1 IU/mL (ref <=1)
Thyroperoxidase Ab SerPl-aCnc: <1 IU/mL (ref <9)

## 2018-12-01 LAB — THYROID PEROXIDASE ANTIBODIES (TPO) (REFL): Thyroperoxidase Ab SerPl-aCnc: <1 IU/mL (ref <9)

## 2018-12-04 ENCOUNTER — Ambulatory Visit (INDEPENDENT_AMBULATORY_CARE_PROVIDER_SITE_OTHER): Payer: 59 | Admitting: Psychology

## 2018-12-04 DIAGNOSIS — F411 Generalized anxiety disorder: Secondary | ICD-10-CM

## 2018-12-13 ENCOUNTER — Ambulatory Visit
Admission: RE | Admit: 2018-12-13 | Discharge: 2018-12-13 | Disposition: A | Discharge status: Home / Self Care | Payer: 59 | Source: Ambulatory Visit | Attending: Family Medicine | Admitting: Family Medicine

## 2018-12-13 ENCOUNTER — Ambulatory Visit (INDEPENDENT_AMBULATORY_CARE_PROVIDER_SITE_OTHER): Payer: 59 | Admitting: Physician Assistant

## 2018-12-13 ENCOUNTER — Telehealth: Payer: Self-pay | Admitting: Family Medicine

## 2018-12-13 ENCOUNTER — Encounter (INDEPENDENT_AMBULATORY_CARE_PROVIDER_SITE_OTHER): Payer: Self-pay | Admitting: Physician Assistant

## 2018-12-13 ENCOUNTER — Other Ambulatory Visit: Payer: Self-pay

## 2018-12-13 VITALS — BP 133/78 | HR 73 | Temp 97.8°F | Ht 68.0 in | Wt 330.0 lb

## 2018-12-13 DIAGNOSIS — E010 Iodine-deficiency related diffuse (endemic) goiter: Secondary | ICD-10-CM

## 2018-12-13 DIAGNOSIS — E049 Nontoxic goiter, unspecified: Secondary | ICD-10-CM | POA: Diagnosis not present

## 2018-12-13 DIAGNOSIS — E559 Vitamin D deficiency, unspecified: Secondary | ICD-10-CM

## 2018-12-13 DIAGNOSIS — Z6841 Body Mass Index (BMI) 40.0 and over, adult: Secondary | ICD-10-CM

## 2018-12-13 NOTE — Telephone Encounter (Signed)
Referral placed for her today. Compressive symptoms d/t thyromegaly.

## 2018-12-13 NOTE — Telephone Encounter (Signed)
Pt was called and given results. She would like a referral to a Psychologist, sport and exercise. She does prefer to stay within [facility].

## 2018-12-13 NOTE — Progress Notes (Signed)
Office: [phone]  /  Fax: [phone]   HPI:   Chief Complaint: OBESITY [name] is here to discuss her progress with her obesity treatment plan. She is on the Category 4 plan with journaling and is following her eating plan approximately 100 % of the time. She states she is doing cardio and strength 20 to 30 minutes 5 times per week. [name] did very well with journaling. She enjoys it and would like to continue. She is reaching her protein goals daily.  Her weight is (!) 330 lb (149.7 kg) today and has had a weight loss of 3 pounds over a period of 3 months since her last visit. She has lost 33 lbs since starting treatment with [place].  Vitamin D Deficiency [name] has a diagnosis of vitamin D deficiency. She is currently on OTC vit D. Orpah denies nausea, vomiting, or muscle weakness.  ASSESSMENT AND PLAN:  Vitamin D deficiency  Class 3 severe obesity with serious comorbidity and body mass index (BMI) of 50.0 to 59.9 in adult, unspecified obesity type ([place])  PLAN:  Vitamin D Deficiency [name] was informed that low vitamin D levels contribute to fatigue and are associated with obesity, breast, and colon cancer. [name] agrees to continue to take OTC Vit D and will follow up for routine testing of vitamin D, at least 2-3 times per year. She was informed of the risk of over-replacement of vitamin D and agrees to not increase her dose unless she discusses this with [place] first. We will check labs at her next visit. [name] agrees to follow up in 3 weeks as directed.  The time spent on this visit ws 25 minutes.  Obesity [name] is currently in the action stage of change. As such, her goal is to continue with weight loss efforts. She has agreed to keep a food journal with 1800 calories and 115 grams of protein.  [name] has been instructed to work up to a goal of 150 minutes of combined cardio and strengthening exercise per week for weight loss and overall health benefits. We  discussed the following Behavioral Modification Strategies today: work on meal planning and easy cooking plans and ways to avoid boredom eating.  [name] has agreed to follow up with our clinic in 3 weeks. She was informed of the importance of frequent follow up visits to maximize her success with intensive lifestyle modifications for her multiple health conditions.  I spent > than 50% of the 25 minute visit on counseling as documented in the note.   ALLERGIES: No Known Allergies  MEDICATIONS: Current Outpatient Medications on File Prior to Visit  Medication Sig Dispense Refill   Cholecalciferol 5000 units capsule Take 1 capsule (5,000 Units total) by mouth daily. With meal 30 capsule 11   estradiol (CLIMARA - DOSED IN MG/24 HR) 0.1 mg/24hr patch Place 0.1 mg onto the skin. Apply and change patch twice weekly     medroxyPROGESTERone (PROVERA) 5 MG tablet Take 1 tablet by mouth every 30 (thirty) days. Pt takes 12 tablets a month.  11   metFORMIN (GLUCOPHAGE-XR) 500 MG 24 hr tablet Take 2 tablets (1,000 mg total) by mouth daily. 180 tablet 1   Multiple Vitamin (MULTIVITAMIN) capsule Take 1 capsule by mouth daily.     PREMARIN 0.625 MG tablet      No current facility-administered medications on file prior to visit.     PAST MEDICAL HISTORY: Past Medical History:  Diagnosis Date   Anemia    Back pain  BPES syndrome    Enlarged thyroid    Infertility, female    Obesity    Palpitations    Palpitations    PCOS (polycystic ovarian syndrome)    PONV (postoperative nausea and vomiting)    Prediabetes    Premature ovarian failure    Vitamin B 12 deficiency    Vitamin D deficiency    Wears eyeglasses     PAST SURGICAL HISTORY: Past Surgical History:  Procedure Laterality Date   EYE SURGERY Bilateral    NASAL SEPTOPLASTY W/ TURBINOPLASTY Bilateral 03/04/2017   Procedure: NASAL SEPTOPLASTY WITH BILATERAL INFERIOR TURBINATE REDUCTION;  Surgeon: Jerrell Belfast, MD;  Location: [facility];  Service: ENT;  Laterality: Bilateral;   NASAL SEPTUM SURGERY  03/04/2017   TONSILLECTOMY AND ADENOIDECTOMY Bilateral 03/04/2017   Procedure: TONSILLECTOMY;  Surgeon: Jerrell Belfast, MD;  Location: [facility];  Service: ENT;  Laterality: Bilateral;    SOCIAL HISTORY: Social History   Tobacco Use   Smoking status: Never Smoker   Smokeless tobacco: Never Used  Substance Use Topics   Alcohol use: Yes    Alcohol/week: 0.0 standard drinks    Comment: socially   Drug use: No    FAMILY HISTORY: Family History  Problem Relation Age of Onset   Hyperlipidemia Father    Hypertension Father    Heart disease Father    Sudden death Father        Age 27   Other Father        BPES   Heart attack Father    Other Other        BPES   Other Other        BPES   Hyperlipidemia Mother    Hypertension Mother    Diabetes Mother    Anxiety disorder Mother    Obesity Mother    Other Brother        BPES   Other Sister        BPES   Other Paternal Uncle        BPES   Other Cousin        BPES   Other Cousin        BPES   Heart attack Maternal Grandmother    Heart disease Maternal Grandfather    Heart attack Maternal Grandfather    Lung cancer Maternal Grandfather    Heart disease Paternal Grandfather heartr attack   Heart attack Paternal Grandfather 90       MI   Sudden death Paternal Grandfather     ROS: Review of Systems  Gastrointestinal: Negative for nausea and vomiting.  Musculoskeletal:       Negative for muscle weakness.    PHYSICAL EXAM: Blood pressure 133/78, pulse 73, temperature 97.8 F (36.6 C), temperature source Oral, height 5\' 8"  (1.727 m), weight (!) 330 lb (149.7 kg), last menstrual period 11/14/2018, SpO2 97 %. Body mass index is 50.18 kg/m. Physical Exam Vitals signs reviewed.  Constitutional:      Appearance: Normal appearance. She is obese.  Cardiovascular:     Rate and Rhythm: Normal rate.    Pulmonary:     Effort: Pulmonary effort is normal.  Musculoskeletal: Normal range of motion.  Skin:    General: Skin is warm and dry.  Neurological:     Mental Status: She is alert and oriented to person, place, and time.  Psychiatric:        Mood and Affect: Mood normal.        Behavior: Behavior  normal.     RECENT LABS AND TESTS: BMET    Component Value Date/Time   NA 141 07/17/2018 0915   K 4.6 07/17/2018 0915   CL 106 07/17/2018 0915   CO2 21 07/17/2018 0915   GLUCOSE 86 07/17/2018 0915   GLUCOSE 89 05/25/2017 0843   BUN 17 07/17/2018 0915   CREATININE 0.66 07/17/2018 0915   CALCIUM 9.3 07/17/2018 0915   GFRNONAA 119 07/17/2018 0915   GFRAA 137 07/17/2018 0915   Lab Results  Component Value Date   HGBA1C 5.5 11/30/2018   HGBA1C 5.1 07/17/2018   HGBA1C 5.3 02/16/2018   HGBA1C 5.5 11/02/2017   HGBA1C 5.7 05/25/2017   Lab Results  Component Value Date   INSULIN 15.0 07/17/2018   INSULIN 16.5 02/16/2018   INSULIN 35.6 (H) 11/02/2017   CBC    Component Value Date/Time   WBC 8.4 11/02/2017 0904   WBC 5.9 05/25/2017 0843   RBC 4.39 11/02/2017 0904   RBC 4.68 05/25/2017 0843   HGB 11.8 11/02/2017 0904   HCT 36.7 11/02/2017 0904   PLT 243.0 05/25/2017 0843   MCV 84 11/02/2017 0904   MCH 26.9 11/02/2017 0904   MCH 27.0 03/04/2017 1157   MCHC 32.2 11/02/2017 0904   MCHC 31.8 05/25/2017 0843   RDW 15.1 11/02/2017 0904   LYMPHSABS 2.4 11/02/2017 0904   MONOABS 0.6 05/25/2017 0843   EOSABS 0.3 11/02/2017 0904   BASOSABS 0.0 11/02/2017 0904   Iron/TIBC/Ferritin/ %Sat    Component Value Date/Time   IRON 30 11/02/2017 0904   TIBC 337 11/02/2017 0904   FERRITIN 28 11/02/2017 0904   IRONPCTSAT 9 (LL) 11/02/2017 0904   Lipid Panel     Component Value Date/Time   CHOL 168 07/17/2018 0915   TRIG 94 07/17/2018 0915   HDL 45 07/17/2018 0915   CHOLHDL 3 05/25/2017 0843   VLDL 20.6 05/25/2017 0843   LDLCALC 104 (H) 07/17/2018 0915   Hepatic Function Panel      Component Value Date/Time   PROT 6.4 07/17/2018 0915   ALBUMIN 4.1 07/17/2018 0915   AST 9 07/17/2018 0915   ALT 9 07/17/2018 0915   ALKPHOS 62 07/17/2018 0915   BILITOT 0.2 07/17/2018 0915      Component Value Date/Time   TSH 1.41 11/30/2018 1210   TSH 2.700 11/02/2017 0904   TSH 1.97 05/25/2017 0843   Results for DANEISHA, SURGES (MRN [id number]) as of 12/13/2018 16:50  Ref. Range 07/17/2018 09:15  Vitamin D, 25-Hydroxy Latest Ref Range: 30.0 - 100.0 ng/mL 47.6   OBESITY BEHAVIORAL INTERVENTION VISIT  Today's visit was # 22   Starting weight: 363 lbs Starting date: 11/02/17 Today's weight : Weight: (!) 330 lb (149.7 kg)  Today's date: 12/13/2018 Total lbs lost to date: 33    12/13/2018  Height 5\' 8"  (1.727 m)  Weight 330 lb (149.7 kg) (A)  BMI (Calculated) 50.19  BLOOD PRESSURE - SYSTOLIC 621  BLOOD PRESSURE - DIASTOLIC 78   Body Fat % 30.8 %    ASK: We discussed the diagnosis of obesity with Johnsie Cancel today and Sanyla agreed to give [place] permission to discuss obesity behavioral modification therapy today.  ASSESS: Natalyn has the diagnosis of obesity and her BMI today is 50.19. Arminda is in the action stage of change.   ADVISE: Ronell was educated on the multiple health risks of obesity as well as the benefit of weight loss to improve her health. She was advised of the need  for long term treatment and the importance of lifestyle modifications to improve her current health and to decrease her risk of future health problems.  AGREE: Multiple dietary modification options and treatment options were discussed and Susy agreed to follow the recommendations documented in the above note.  ARRANGE: Adaliah was educated on the importance of frequent visits to treat obesity as outlined per CMS and USPSTF guidelines and agreed to schedule her next follow up appointment today.  IMarcille Blanco, CMA, am acting as transcriptionist for Abby Potash, PA-C

## 2018-12-13 NOTE — Addendum Note (Signed)
Addended by: Howard Pouch A on: 12/13/2018 02:16 PM   Modules accepted: Orders

## 2018-12-13 NOTE — Telephone Encounter (Signed)
Please inform patient the following information: Her thyroid ultrasound again shows a diffusely large thyroid with any nodules. It is slightly larger in dimensions than prior reading.  She has reported compressive symptoms worsening, therefore if she would like to speak to a surgeon about removal we can refer her to one to discuss options and get their opinion. Please advise of her decision.

## 2019-01-01 ENCOUNTER — Ambulatory Visit (INDEPENDENT_AMBULATORY_CARE_PROVIDER_SITE_OTHER): Payer: 59 | Admitting: Psychology

## 2019-01-01 DIAGNOSIS — F411 Generalized anxiety disorder: Secondary | ICD-10-CM | POA: Diagnosis not present

## 2019-01-02 ENCOUNTER — Encounter (INDEPENDENT_AMBULATORY_CARE_PROVIDER_SITE_OTHER): Payer: Self-pay | Admitting: Physician Assistant

## 2019-01-02 ENCOUNTER — Ambulatory Visit (INDEPENDENT_AMBULATORY_CARE_PROVIDER_SITE_OTHER): Payer: 59 | Admitting: Physician Assistant

## 2019-01-02 ENCOUNTER — Other Ambulatory Visit: Payer: Self-pay

## 2019-01-02 VITALS — BP 124/80 | HR 72 | Temp 98.0°F | Ht 68.0 in | Wt 327.0 lb

## 2019-01-02 DIAGNOSIS — E7849 Other hyperlipidemia: Secondary | ICD-10-CM

## 2019-01-02 DIAGNOSIS — E559 Vitamin D deficiency, unspecified: Secondary | ICD-10-CM

## 2019-01-02 DIAGNOSIS — R7303 Prediabetes: Secondary | ICD-10-CM | POA: Diagnosis not present

## 2019-01-02 DIAGNOSIS — Z9189 Other specified personal risk factors, not elsewhere classified: Secondary | ICD-10-CM | POA: Diagnosis not present

## 2019-01-02 DIAGNOSIS — Z6841 Body Mass Index (BMI) 40.0 and over, adult: Secondary | ICD-10-CM | POA: Diagnosis not present

## 2019-01-03 LAB — INSULIN, RANDOM: INSULIN: 9 u[IU]/mL (ref 2.6–24.9)

## 2019-01-03 LAB — LIPID PANEL WITH LDL/HDL RATIO
Cholesterol, Total: 171 mg/dL (ref 100–199)
HDL: 50 mg/dL (ref >39)
LDL Calculated: 95 mg/dL (ref 0–99)
LDl/HDL Ratio: 1.9 ratio (ref 0.0–3.2)
Triglycerides: 132 mg/dL (ref 0–149)
VLDL Cholesterol Cal: 26 mg/dL (ref 5–40)

## 2019-01-03 LAB — COMPREHENSIVE METABOLIC PANEL
ALT: 33 IU/L — ABNORMAL HIGH (ref 0–32)
AST: 17 IU/L (ref 0–40)
Albumin/Globulin Ratio: 1.8 (ref 1.2–2.2)
Albumin: 4 g/dL (ref 3.8–4.8)
Alkaline Phosphatase: 72 IU/L (ref 39–117)
BUN/Creatinine Ratio: 23 (ref 9–23)
BUN: 14 mg/dL (ref 6–20)
Bilirubin Total: 0.3 mg/dL (ref 0.0–1.2)
CO2: 23 mmol/L (ref 20–29)
Calcium: 9.2 mg/dL (ref 8.7–10.2)
Chloride: 104 mmol/L (ref 96–106)
Creatinine, Ser: 0.6 mg/dL (ref 0.57–1.00)
GFR calc Af Amer: 140 mL/min/{1.73_m2} (ref >59)
GFR calc non Af Amer: 122 mL/min/{1.73_m2} (ref >59)
Globulin, Total: 2.2 g/dL (ref 1.5–4.5)
Glucose: 86 mg/dL (ref 65–99)
Potassium: 4.3 mmol/L (ref 3.5–5.2)
Sodium: 143 mmol/L (ref 134–144)
Total Protein: 6.2 g/dL (ref 6.0–8.5)

## 2019-01-03 LAB — VITAMIN D 25 HYDROXY (VIT D DEFICIENCY, FRACTURES): Vit D, 25-Hydroxy: 45.2 ng/mL (ref 30.0–100.0)

## 2019-01-03 NOTE — Progress Notes (Signed)
Office: [phone]  /  Fax: [phone]   HPI:   Chief Complaint: OBESITY Marriah is here to discuss her progress with her obesity treatment plan. She is on the keep a food journal with 1800 calories and 115 grams of protein and the Category 4 plan and she is following her eating plan approximately 100 % of the time. She states she is doing cardio exercise 30 minutes 5 times per week. Kelci is doing very well with the plan. She is meeting her calorie and protein goals daily. She denies excessive hunger. Her weight is (!) 327 lb (148.3 kg) today and has had a weight loss of 3 pounds over a period of 3 weeks since her last visit. She has lost 36 lbs since starting treatment with [place].  Vitamin D deficiency Lizzete has a diagnosis of vitamin D deficiency. She is currently taking OTC vit D and denies nausea, vomiting or muscle weakness.  Pre-Diabetes Milissa has a diagnosis of prediabetes based on her elevated Hgb A1c and was informed this puts her at greater risk of developing diabetes. She is taking metformin currently and continues to work on diet and exercise to decrease risk of diabetes. She denies nausea, vomiting, diarrhea or polyphagia.  Hyperlipidemia Avenell has hyperlipidemia and she is not on medications. She has been trying to improve her cholesterol levels with intensive lifestyle modification including a low saturated fat diet, exercise and weight loss. She denies any chest pain.  ASSESSMENT AND PLAN:  Vitamin D deficiency - Plan: VITAMIN D 25 Hydroxy (Vit-D Deficiency, Fractures)  Prediabetes - Plan: Insulin, random, Comprehensive metabolic panel  Other hyperlipidemia - Plan: Lipid Panel With LDL/HDL Ratio  At risk for diabetes mellitus  Class 3 severe obesity with serious comorbidity and body mass index (BMI) of 45.0 to 49.9 in adult, unspecified obesity type ([place])  PLAN:  Vitamin D Deficiency Meridian was informed that low vitamin D levels contributes to fatigue  and are associated with obesity, breast, and colon cancer. She will continue to take OTC Vit D and will follow up for routine testing of vitamin D, at least 2-3 times per year. She was informed of the risk of over-replacement of vitamin D and agrees to not increase her dose unless she discusses this with [place] first. We will check labs and Naveya will follow up as directed.  Pre-Diabetes Drucilla will continue to work on weight loss, exercise, and decreasing simple carbohydrates in her diet to help decrease the risk of diabetes. We dicussed metformin including benefits and risks. She was informed that eating too many simple carbohydrates or too many calories at one sitting increases the likelihood of GI side effects. Shakila will continue metformin for now and a prescription was not written today. We will check labs and Ashira agreed to follow up with [place] as directed to monitor her progress.  Hyperlipidemia Melanni was informed of the American Heart Association Guidelines emphasizing intensive lifestyle modifications as the first line treatment for hyperlipidemia. We discussed many lifestyle modifications today in depth, and Janiyla will continue to work on decreasing saturated fats such as fatty red meat, butter and many fried foods. She will also increase vegetables and lean protein in her diet and continue to work on exercise and weight loss efforts.  Obesity Braidyn is currently in the action stage of change. As such, her goal is to continue with weight loss efforts She has agreed to keep a food journal with 1800 calories and 115 grams of protein daily Lynsey has  been instructed to work up to a goal of 150 minutes of combined cardio and strengthening exercise per week for weight loss and overall health benefits. We discussed the following Behavioral Modification Strategies today: keeping healthy foods in the home and work on meal planning and easy cooking plans  Raizel has agreed to follow up with  our clinic in 3 weeks. She was informed of the importance of frequent follow up visits to maximize her success with intensive lifestyle modifications for her multiple health conditions.  ALLERGIES: No Known Allergies  MEDICATIONS: Current Outpatient Medications on File Prior to Visit  Medication Sig Dispense Refill   Cholecalciferol 5000 units capsule Take 1 capsule (5,000 Units total) by mouth daily. With meal 30 capsule 11   estradiol (CLIMARA - DOSED IN MG/24 HR) 0.1 mg/24hr patch Place 0.1 mg onto the skin. Apply and change patch twice weekly     medroxyPROGESTERone (PROVERA) 5 MG tablet Take 1 tablet by mouth every 30 (thirty) days. Pt takes 12 tablets a month.  11   metFORMIN (GLUCOPHAGE-XR) 500 MG 24 hr tablet Take 2 tablets (1,000 mg total) by mouth daily. 180 tablet 1   Multiple Vitamin (MULTIVITAMIN) capsule Take 1 capsule by mouth daily.     PREMARIN 0.625 MG tablet      No current facility-administered medications on file prior to visit.     PAST MEDICAL HISTORY: Past Medical History:  Diagnosis Date   Anemia    Back pain    BPES syndrome    Enlarged thyroid    Infertility, female    Obesity    Palpitations    Palpitations    PCOS (polycystic ovarian syndrome)    PONV (postoperative nausea and vomiting)    Prediabetes    Premature ovarian failure    Vitamin B 12 deficiency    Vitamin D deficiency    Wears eyeglasses     PAST SURGICAL HISTORY: Past Surgical History:  Procedure Laterality Date   EYE SURGERY Bilateral    NASAL SEPTOPLASTY W/ TURBINOPLASTY Bilateral 03/04/2017   Procedure: NASAL SEPTOPLASTY WITH BILATERAL INFERIOR TURBINATE REDUCTION;  Surgeon: Jerrell Belfast, MD;  Location: [facility];  Service: ENT;  Laterality: Bilateral;   NASAL SEPTUM SURGERY  03/04/2017   TONSILLECTOMY AND ADENOIDECTOMY Bilateral 03/04/2017   Procedure: TONSILLECTOMY;  Surgeon: Jerrell Belfast, MD;  Location: [facility];  Service: ENT;  Laterality:  Bilateral;    SOCIAL HISTORY: Social History   Tobacco Use   Smoking status: Never Smoker   Smokeless tobacco: Never Used  Substance Use Topics   Alcohol use: Yes    Alcohol/week: 0.0 standard drinks    Comment: socially   Drug use: No    FAMILY HISTORY: Family History  Problem Relation Age of Onset   Hyperlipidemia Father    Hypertension Father    Heart disease Father    Sudden death Father        Age 28   Other Father        BPES   Heart attack Father    Other Other        BPES   Other Other        BPES   Hyperlipidemia Mother    Hypertension Mother    Diabetes Mother    Anxiety disorder Mother    Obesity Mother    Other Brother        BPES   Other Sister        BPES   Other Paternal Uncle  BPES   Other Cousin        BPES   Other Cousin        BPES   Heart attack Maternal Grandmother    Heart disease Maternal Grandfather    Heart attack Maternal Grandfather    Lung cancer Maternal Grandfather    Heart disease Paternal Grandfather heartr attack   Heart attack Paternal Grandfather 12       MI   Sudden death Paternal Grandfather     ROS: Review of Systems  Constitutional: Positive for weight loss.  Cardiovascular: Negative for chest pain.  Gastrointestinal: Negative for diarrhea, nausea and vomiting.  Musculoskeletal:       Negative for muscle weakness  Endo/Heme/Allergies:       Negative for polyphagia    PHYSICAL EXAM: Blood pressure 124/80, pulse 72, temperature 98 F (36.7 C), height 5\' 8"  (1.727 m), weight (!) 327 lb (148.3 kg), SpO2 98 %. Body mass index is 49.72 kg/m. Physical Exam Vitals signs reviewed.  Constitutional:      Appearance: Normal appearance. She is well-developed. She is obese.  Cardiovascular:     Rate and Rhythm: Normal rate.  Pulmonary:     Effort: Pulmonary effort is normal.  Musculoskeletal: Normal range of motion.  Skin:    General: Skin is warm and dry.  Neurological:      Mental Status: She is alert and oriented to person, place, and time.  Psychiatric:        Mood and Affect: Mood normal.        Behavior: Behavior normal.     RECENT LABS AND TESTS: BMET    Component Value Date/Time   NA 141 07/17/2018 0915   K 4.6 07/17/2018 0915   CL 106 07/17/2018 0915   CO2 21 07/17/2018 0915   GLUCOSE 86 07/17/2018 0915   GLUCOSE 89 05/25/2017 0843   BUN 17 07/17/2018 0915   CREATININE 0.66 07/17/2018 0915   CALCIUM 9.3 07/17/2018 0915   GFRNONAA 119 07/17/2018 0915   GFRAA 137 07/17/2018 0915   Lab Results  Component Value Date   HGBA1C 5.5 11/30/2018   HGBA1C 5.1 07/17/2018   HGBA1C 5.3 02/16/2018   HGBA1C 5.5 11/02/2017   HGBA1C 5.7 05/25/2017   Lab Results  Component Value Date   INSULIN 15.0 07/17/2018   INSULIN 16.5 02/16/2018   INSULIN 35.6 (H) 11/02/2017   CBC    Component Value Date/Time   WBC 8.4 11/02/2017 0904   WBC 5.9 05/25/2017 0843   RBC 4.39 11/02/2017 0904   RBC 4.68 05/25/2017 0843   HGB 11.8 11/02/2017 0904   HCT 36.7 11/02/2017 0904   PLT 243.0 05/25/2017 0843   MCV 84 11/02/2017 0904   MCH 26.9 11/02/2017 0904   MCH 27.0 03/04/2017 1157   MCHC 32.2 11/02/2017 0904   MCHC 31.8 05/25/2017 0843   RDW 15.1 11/02/2017 0904   LYMPHSABS 2.4 11/02/2017 0904   MONOABS 0.6 05/25/2017 0843   EOSABS 0.3 11/02/2017 0904   BASOSABS 0.0 11/02/2017 0904   Iron/TIBC/Ferritin/ %Sat    Component Value Date/Time   IRON 30 11/02/2017 0904   TIBC 337 11/02/2017 0904   FERRITIN 28 11/02/2017 0904   IRONPCTSAT 9 (LL) 11/02/2017 0904   Lipid Panel     Component Value Date/Time   CHOL 168 07/17/2018 0915   TRIG 94 07/17/2018 0915   HDL 45 07/17/2018 0915   CHOLHDL 3 05/25/2017 0843   VLDL 20.6 05/25/2017 0843   LDLCALC 104 (H)  07/17/2018 0915   Hepatic Function Panel     Component Value Date/Time   PROT 6.4 07/17/2018 0915   ALBUMIN 4.1 07/17/2018 0915   AST 9 07/17/2018 0915   ALT 9 07/17/2018 0915   ALKPHOS 62  07/17/2018 0915   BILITOT 0.2 07/17/2018 0915      Component Value Date/Time   TSH 1.41 11/30/2018 1210   TSH 2.700 11/02/2017 0904   TSH 1.97 05/25/2017 0843     Ref. Range 07/17/2018 09:15  Vitamin D, 25-Hydroxy Latest Ref Range: 30.0 - 100.0 ng/mL 47.6    OBESITY BEHAVIORAL INTERVENTION VISIT  Today's visit was # 23  Starting weight: 363 lbs Starting date: 11/02/2017 Today's weight : 327 lbs Today's date: 01/02/2019 Total lbs lost to date: 36    01/02/2019  Height 5\' 8"  (1.727 m)  Weight 327 lb (148.3 kg) (A)  BMI (Calculated) 49.73  BLOOD PRESSURE - SYSTOLIC 672  BLOOD PRESSURE - DIASTOLIC 80   Body Fat % 09.4 %    ASK: We discussed the diagnosis of obesity with Johnsie Cancel today and Maryem agreed to give [place] permission to discuss obesity behavioral modification therapy today.  ASSESS: Genola has the diagnosis of obesity and her BMI today is 49.73 Arianis is in the action stage of change   ADVISE: Fayrene was educated on the multiple health risks of obesity as well as the benefit of weight loss to improve her health. She was advised of the need for long term treatment and the importance of lifestyle modifications to improve her current health and to decrease her risk of future health problems.  AGREE: Multiple dietary modification options and treatment options were discussed and  Deeanne agreed to follow the recommendations documented in the above note.  ARRANGE: Laelynn was educated on the importance of frequent visits to treat obesity as outlined per CMS and USPSTF guidelines and agreed to schedule her next follow up appointment today.  Corey Skains, am acting as transcriptionist for Abby Potash, PA-C I, Abby Potash, PA-C have reviewed above note and agree with its content

## 2019-01-09 ENCOUNTER — Encounter: Payer: Self-pay | Admitting: Family Medicine

## 2019-01-09 DIAGNOSIS — E010 Iodine-deficiency related diffuse (endemic) goiter: Secondary | ICD-10-CM

## 2019-01-09 NOTE — Telephone Encounter (Signed)
Pt sent MyChart message stating:   Hello Dr. Raoul Pitch, I am reaching out to ask for a referral to  Endocrinology (I'm open to suggestions of practices, [facility] has been mentioned to me before). The sensitivity to any amount of pressure or touch is still pretty present. Last we spoke you stated a referral to a general surgeon would be an option I could pursue given the enlarged thyroid and I was in agreement but I never received a call. I've been thinking about it and thought a referral to an endocrinologist May be a easier step next as opposed to surgery.     Her last appointment was 11/30/18 and a referral was placed to general surgery on 12/13/18. Please advise regarding the referral request.

## 2019-01-09 NOTE — Telephone Encounter (Signed)
Telephone message created and sent to PCP for review.

## 2019-01-09 NOTE — Telephone Encounter (Signed)
FYI

## 2019-01-24 ENCOUNTER — Encounter (INDEPENDENT_AMBULATORY_CARE_PROVIDER_SITE_OTHER): Payer: Self-pay | Admitting: Physician Assistant

## 2019-01-24 ENCOUNTER — Other Ambulatory Visit: Payer: Self-pay

## 2019-01-24 ENCOUNTER — Ambulatory Visit (INDEPENDENT_AMBULATORY_CARE_PROVIDER_SITE_OTHER): Payer: 59 | Admitting: Physician Assistant

## 2019-01-24 VITALS — BP 126/81 | HR 65 | Temp 97.9°F | Ht 68.0 in | Wt 324.0 lb

## 2019-01-24 DIAGNOSIS — Z6841 Body Mass Index (BMI) 40.0 and over, adult: Secondary | ICD-10-CM

## 2019-01-24 DIAGNOSIS — E559 Vitamin D deficiency, unspecified: Secondary | ICD-10-CM

## 2019-01-24 NOTE — Progress Notes (Signed)
Office: [phone]  /  Fax: [phone]   HPI:   Chief Complaint: OBESITY [name] is here to discuss her progress with her obesity treatment plan. She is on the Category 4 plan with journaling and is following her eating plan approximately 100% of the time. She states she is doing cardio/strengthening 30 minutes 4-5 times per week. [name] reports that she is following the plan closely and is exercising regularly. Her weight is (!) 324 lb (147 kg) today and has had a weight loss of 3 pounds over a period of 3 weeks since her last visit. She has lost 39 lbs since starting treatment with [place].  Vitamin D deficiency [name] has a diagnosis of Vitamin D deficiency. She is currently taking OTC Vit D and denies nausea, vomiting or muscle weakness.  ASSESSMENT AND PLAN:  Vitamin D deficiency  Class 3 severe obesity with serious comorbidity and body mass index (BMI) of 45.0 to 49.9 in adult, unspecified obesity type ([place])  PLAN:  Vitamin D Deficiency [name] was informed that low Vitamin D levels contributes to fatigue and are associated with obesity, breast, and colon cancer. She agrees to continue taking OTC Vit D and will follow-up for routine testing of Vitamin D, at least 2-3 times per year. She was informed of the risk of over-replacement of Vitamin D and agrees to not increase her dose unless she discusses this with [place] first. [name] agrees to follow-up with our clinic in 3 weeks.  I spent > than 50% of the 25 minute visit on counseling as documented in the note.  Obesity [name] is currently in the action stage of change. As such, her goal is to continue with weight loss efforts. She has agreed to follow the Category 4 plan and journal 1800 calories + 115 grams of protein. [name] has been instructed to work up to a goal of 150 minutes of combined cardio and strengthening exercise per week for weight loss and overall health benefits. We discussed the following Behavioral  Modification Strategies today: work on meal planning and easy cooking plans, keeping healthy foods in the home, planning for success, and keep a strict food journal.  [name] has agreed to follow-up with our clinic in 3 weeks. She was informed of the importance of frequent follow-up visits to maximize her success with intensive lifestyle modifications for her multiple health conditions.  I spent > than 50% of the 25 minute visit on counseling as documented in the note.   ALLERGIES: No Known Allergies  MEDICATIONS: Current Outpatient Medications on File Prior to Visit  Medication Sig Dispense Refill   Cholecalciferol 5000 units capsule Take 1 capsule (5,000 Units total) by mouth daily. With meal 30 capsule 11   estradiol (CLIMARA - DOSED IN MG/24 HR) 0.1 mg/24hr patch Place 0.1 mg onto the skin. Apply and change patch twice weekly     medroxyPROGESTERone (PROVERA) 5 MG tablet Take 1 tablet by mouth every 30 (thirty) days. Pt takes 12 tablets a month.  11   metFORMIN (GLUCOPHAGE-XR) 500 MG 24 hr tablet Take 2 tablets (1,000 mg total) by mouth daily. 180 tablet 1   Multiple Vitamin (MULTIVITAMIN) capsule Take 1 capsule by mouth daily.     PREMARIN 0.625 MG tablet      No current facility-administered medications on file prior to visit.     PAST MEDICAL HISTORY: Past Medical History:  Diagnosis Date   Anemia    Back pain    BPES syndrome    Enlarged thyroid  Infertility, female    Obesity    Palpitations    Palpitations    PCOS (polycystic ovarian syndrome)    PONV (postoperative nausea and vomiting)    Prediabetes    Premature ovarian failure    Vitamin B 12 deficiency    Vitamin D deficiency    Wears eyeglasses     PAST SURGICAL HISTORY: Past Surgical History:  Procedure Laterality Date   EYE SURGERY Bilateral    NASAL SEPTOPLASTY W/ TURBINOPLASTY Bilateral 03/04/2017   Procedure: NASAL SEPTOPLASTY WITH BILATERAL INFERIOR TURBINATE REDUCTION;   Surgeon: Jerrell Belfast, MD;  Location: [facility];  Service: ENT;  Laterality: Bilateral;   NASAL SEPTUM SURGERY  03/04/2017   TONSILLECTOMY AND ADENOIDECTOMY Bilateral 03/04/2017   Procedure: TONSILLECTOMY;  Surgeon: Jerrell Belfast, MD;  Location: [place];  Service: ENT;  Laterality: Bilateral;    SOCIAL HISTORY: Social History   Tobacco Use   Smoking status: Never Smoker   Smokeless tobacco: Never Used  Substance Use Topics   Alcohol use: Yes    Alcohol/week: 0.0 standard drinks    Comment: socially   Drug use: No    FAMILY HISTORY: Family History  Problem Relation Age of Onset   Hyperlipidemia Father    Hypertension Father    Heart disease Father    Sudden death Father        Age 52   Other Father        BPES   Heart attack Father    Other Other        BPES   Other Other        BPES   Hyperlipidemia Mother    Hypertension Mother    Diabetes Mother    Anxiety disorder Mother    Obesity Mother    Other Brother        BPES   Other Sister        BPES   Other Paternal Uncle        BPES   Other Cousin        BPES   Other Cousin        BPES   Heart attack Maternal Grandmother    Heart disease Maternal Grandfather    Heart attack Maternal Grandfather    Lung cancer Maternal Grandfather    Heart disease Paternal Grandfather heartr attack   Heart attack Paternal Grandfather 8       MI   Sudden death Paternal Grandfather    ROS: Review of Systems  Gastrointestinal: Negative for nausea and vomiting.  Musculoskeletal:       Negative for muscle weakness.   PHYSICAL EXAM: Blood pressure 126/81, pulse 65, temperature 97.9 F (36.6 C), temperature source Oral, height 5\' 8"  (1.727 m), weight (!) 324 lb (147 kg), SpO2 99 %. Body mass index is 49.26 kg/m. Physical Exam Vitals signs reviewed.  Constitutional:      Appearance: Normal appearance. She is obese.  Cardiovascular:     Rate and Rhythm: Normal rate.     Pulses: Normal  pulses.  Pulmonary:     Effort: Pulmonary effort is normal.     Breath sounds: Normal breath sounds.  Musculoskeletal: Normal range of motion.  Skin:    General: Skin is warm and dry.  Neurological:     Mental Status: She is alert and oriented to person, place, and time.  Psychiatric:        Behavior: Behavior normal.   RECENT LABS AND TESTS: BMET    Component Value  Date/Time   NA 143 01/02/2019 1223   K 4.3 01/02/2019 1223   CL 104 01/02/2019 1223   CO2 23 01/02/2019 1223   GLUCOSE 86 01/02/2019 1223   GLUCOSE 89 05/25/2017 0843   BUN 14 01/02/2019 1223   CREATININE 0.60 01/02/2019 1223   CALCIUM 9.2 01/02/2019 1223   GFRNONAA 122 01/02/2019 1223   GFRAA 140 01/02/2019 1223   Lab Results  Component Value Date   HGBA1C 5.5 11/30/2018   HGBA1C 5.1 07/17/2018   HGBA1C 5.3 02/16/2018   HGBA1C 5.5 11/02/2017   HGBA1C 5.7 05/25/2017   Lab Results  Component Value Date   INSULIN 9.0 01/02/2019   INSULIN 15.0 07/17/2018   INSULIN 16.5 02/16/2018   INSULIN 35.6 (H) 11/02/2017   CBC    Component Value Date/Time   WBC 8.4 11/02/2017 0904   WBC 5.9 05/25/2017 0843   RBC 4.39 11/02/2017 0904   RBC 4.68 05/25/2017 0843   HGB 11.8 11/02/2017 0904   HCT 36.7 11/02/2017 0904   PLT 243.0 05/25/2017 0843   MCV 84 11/02/2017 0904   MCH 26.9 11/02/2017 0904   MCH 27.0 03/04/2017 1157   MCHC 32.2 11/02/2017 0904   MCHC 31.8 05/25/2017 0843   RDW 15.1 11/02/2017 0904   LYMPHSABS 2.4 11/02/2017 0904   MONOABS 0.6 05/25/2017 0843   EOSABS 0.3 11/02/2017 0904   BASOSABS 0.0 11/02/2017 0904   Iron/TIBC/Ferritin/ %Sat    Component Value Date/Time   IRON 30 11/02/2017 0904   TIBC 337 11/02/2017 0904   FERRITIN 28 11/02/2017 0904   IRONPCTSAT 9 (LL) 11/02/2017 0904   Lipid Panel     Component Value Date/Time   CHOL 171 01/02/2019 1223   TRIG 132 01/02/2019 1223   HDL 50 01/02/2019 1223   CHOLHDL 3 05/25/2017 0843   VLDL 20.6 05/25/2017 0843   LDLCALC 95 01/02/2019  1223   Hepatic Function Panel     Component Value Date/Time   PROT 6.2 01/02/2019 1223   ALBUMIN 4.0 01/02/2019 1223   AST 17 01/02/2019 1223   ALT 33 (H) 01/02/2019 1223   ALKPHOS 72 01/02/2019 1223   BILITOT 0.3 01/02/2019 1223      Component Value Date/Time   TSH 1.41 11/30/2018 1210   TSH 2.700 11/02/2017 0904   TSH 1.97 05/25/2017 0843   Results for AZIAH, KAISER (MRN [id number]) as of 01/24/2019 12:06  Ref. Range 01/02/2019 12:23  Vitamin D, 25-Hydroxy Latest Ref Range: 30.0 - 100.0 ng/mL 45.2   OBESITY BEHAVIORAL INTERVENTION VISIT  Today's visit was #24  Starting weight: 363 lbs Starting date: 11/02/2017 Today's weight: 324 lbs  Today's date: 01/24/2019 Total lbs lost to date: 39    01/24/2019  Height 5\' 8"  (1.727 m)  Weight 324 lb (147 kg) (A)  BMI (Calculated) 49.28  BLOOD PRESSURE - SYSTOLIC 242  BLOOD PRESSURE - DIASTOLIC 81   Body Fat % 68.3 %  Total Body Water (lbs) 116.8 lbs   ASK: We discussed the diagnosis of obesity with [name] today and [name] agreed to give [place] permission to discuss obesity behavioral modification therapy today.  ASSESS: [name] has the diagnosis of obesity and her BMI today is 49.3. Victorian is in the action stage of change.   ADVISE: [name] was educated on the multiple health risks of obesity as well as the benefit of weight loss to improve her health. She was advised of the need for long term treatment and the importance of lifestyle modifications to  improve her current health and to decrease her risk of future health problems.  AGREE: Multiple dietary modification options and treatment options were discussed and  Mirtha agreed to follow the recommendations documented in the above note.  ARRANGE: [name] was educated on the importance of frequent visits to treat obesity as outlined per CMS and USPSTF guidelines and agreed to schedule her next follow up appointment today.  Migdalia Dk, am acting as  transcriptionist for Abby Potash, PA-C I, Abby Potash, PA-C have reviewed above note and agree with its content

## 2019-01-27 ENCOUNTER — Encounter: Payer: Self-pay | Admitting: Family Medicine

## 2019-01-29 ENCOUNTER — Ambulatory Visit (INDEPENDENT_AMBULATORY_CARE_PROVIDER_SITE_OTHER): Payer: 59 | Admitting: Psychology

## 2019-01-29 DIAGNOSIS — F411 Generalized anxiety disorder: Secondary | ICD-10-CM

## 2019-01-30 DIAGNOSIS — E049 Nontoxic goiter, unspecified: Secondary | ICD-10-CM | POA: Diagnosis not present

## 2019-02-04 ENCOUNTER — Encounter: Payer: Self-pay | Admitting: Family Medicine

## 2019-02-06 ENCOUNTER — Other Ambulatory Visit: Payer: Self-pay

## 2019-02-06 ENCOUNTER — Encounter (INDEPENDENT_AMBULATORY_CARE_PROVIDER_SITE_OTHER): Payer: Self-pay | Admitting: Physician Assistant

## 2019-02-06 ENCOUNTER — Ambulatory Visit (INDEPENDENT_AMBULATORY_CARE_PROVIDER_SITE_OTHER): Payer: 59 | Admitting: Family Medicine

## 2019-02-06 ENCOUNTER — Encounter: Payer: Self-pay | Admitting: Family Medicine

## 2019-02-06 DIAGNOSIS — L237 Allergic contact dermatitis due to plants, except food: Secondary | ICD-10-CM

## 2019-02-06 MED ORDER — PREDNISONE 10 MG PO TABS: ORAL_TABLET | ORAL | 0 refills | Status: DC

## 2019-02-06 NOTE — Progress Notes (Signed)
Virtual Visit via Video Note  I connected with [name] on 02/06/19 at  4:00 PM EDT by a video enabled telemedicine application and verified that I am speaking with the correct person using two identifiers.  Location: Patient: work ---  [employer]: office    I discussed the limitations of evaluation and management by telemedicine and the availability of in person appointments. The patient expressed understanding and agreed to proceed.  History of Present Illness: Pt is at work c/o vesicular rash on legs, abd and arms x 1 week.  She has been treating with otc creams, aveeno baths , benadryl with no relief.  It is very itchy and keeping her awake  She had been working in her yard just prior to breaking out in the rash and suspected poison ivy   Observations/Objective: There were no vitals filed for this visit. No vitals obtained Pt is in NAD errythematous rash --- legs b/l and abd No signs infections--- no d/c   Assessment and Plan: 1. Allergic contact dermatitis due to plants, except food Can con't benadryl prn F/u pcp if symptoms do not improve with pred taper  - predniSONE (DELTASONE) 10 MG tablet; TAKE 3 TABLETS PO QD FOR 3 DAYS THEN TAKE 2 TABLETS PO QD FOR 3 DAYS THEN TAKE 1 TABLET PO QD FOR 3 DAYS THEN TAKE 1/2 TAB PO QD FOR 3 DAYS  Dispense: 20 tablet; Refill: 0   Follow Up Instructions:    I discussed the assessment and treatment plan with the patient. The patient was provided an opportunity to ask questions and all were answered. The patient agreed with the plan and demonstrated an understanding of the instructions.   The patient was advised to call back or seek an in-person evaluation if the symptoms worsen or if the condition fails to improve as anticipated.  I provided 15 minutes of non-face-to-face time during this encounter.   Ann Held, DO

## 2019-02-07 NOTE — Telephone Encounter (Signed)
Please advise 

## 2019-02-08 DIAGNOSIS — H5213 Myopia, bilateral: Secondary | ICD-10-CM | POA: Diagnosis not present

## 2019-02-13 ENCOUNTER — Ambulatory Visit (INDEPENDENT_AMBULATORY_CARE_PROVIDER_SITE_OTHER): Payer: 59 | Admitting: Physician Assistant

## 2019-02-13 ENCOUNTER — Other Ambulatory Visit: Payer: Self-pay

## 2019-02-13 ENCOUNTER — Encounter (INDEPENDENT_AMBULATORY_CARE_PROVIDER_SITE_OTHER): Payer: Self-pay | Admitting: Physician Assistant

## 2019-02-13 VITALS — BP 130/82 | HR 60 | Temp 98.3°F | Ht 68.0 in | Wt 319.0 lb

## 2019-02-13 DIAGNOSIS — Z6841 Body Mass Index (BMI) 40.0 and over, adult: Secondary | ICD-10-CM

## 2019-02-13 DIAGNOSIS — E559 Vitamin D deficiency, unspecified: Secondary | ICD-10-CM

## 2019-02-13 NOTE — Progress Notes (Signed)
Office: [phone]  /  Fax: [phone]   HPI:   Chief Complaint: OBESITY [name] is here to discuss her progress with her obesity treatment plan. She is on the Category 4 plan/journaling 1800 calories and 115 grams of protein and is following her eating plan approximately 100% of the time. She states she is doing cardio/strengthening 30 minutes 3-5 times per week. Hayes reports she is doing well with the plan. She continues to be consistent and is exercising regularly as well.  Her weight is (!) 319 lb (144.7 kg) today and has had a weight loss of 5 pounds over a period of 3 weeks since her last visit. She has lost 44 lbs since starting treatment with [place].  Vitamin D deficiency [name] has a diagnosis of Vitamin D deficiency. She is currently taking OTC Vit D and denies nausea, vomiting or muscle weakness.  ASSESSMENT AND PLAN:  Vitamin D deficiency  Class 3 severe obesity with serious comorbidity and body mass index (BMI) of 45.0 to 49.9 in adult, unspecified obesity type ([place])  PLAN:  Vitamin D Deficiency [name] was informed that low Vitamin D levels contributes to fatigue and are associated with obesity, breast, and colon cancer. She agrees to continue taking OTC Vit D and will follow-up for routine testing of Vitamin D, at least 2-3 times per year. She was informed of the risk of over-replacement of Vitamin D and agrees to not increase her dose unless she discusses this with [place] first. Lastar agrees to follow-up with our clinic in 3 weeks.  I spent > than 50% of the 25 minute visit on counseling as documented in the note.  Obesity [name] is currently in the action stage of change. As such, her goal is to continue with weight loss efforts. She has agreed to keep a food journal with 1800 calories and 115 grams of protein daily. Martell has been instructed to work up to a goal of 150 minutes of combined cardio and strengthening exercise per week for weight loss and overall  health benefits. We discussed the following Behavioral Modification Strategies today: planning for success and keep a strict food journal.  [name] has agreed to follow up with our clinic in 3 weeks. She was informed of the importance of frequent follow up visits to maximize her success with intensive lifestyle modifications for her multiple health conditions.  I spent > than 50% of the 25 minute visit on counseling as documented in the note.    ALLERGIES: No Known Allergies  MEDICATIONS: Current Outpatient Medications on File Prior to Visit  Medication Sig Dispense Refill   Cholecalciferol 5000 units capsule Take 1 capsule (5,000 Units total) by mouth daily. With meal 30 capsule 11   estradiol (CLIMARA - DOSED IN MG/24 HR) 0.1 mg/24hr patch Place 0.1 mg onto the skin. Apply and change patch twice weekly     medroxyPROGESTERone (PROVERA) 5 MG tablet Take 1 tablet by mouth every 30 (thirty) days. Pt takes 12 tablets a month.  11   metFORMIN (GLUCOPHAGE-XR) 500 MG 24 hr tablet Take 2 tablets (1,000 mg total) by mouth daily. 180 tablet 1   Multiple Vitamin (MULTIVITAMIN) capsule Take 1 capsule by mouth daily.     predniSONE (DELTASONE) 10 MG tablet TAKE 3 TABLETS PO QD FOR 3 DAYS THEN TAKE 2 TABLETS PO QD FOR 3 DAYS THEN TAKE 1 TABLET PO QD FOR 3 DAYS THEN TAKE 1/2 TAB PO QD FOR 3 DAYS 20 tablet 0   PREMARIN 0.625 MG  tablet      No current facility-administered medications on file prior to visit.     PAST MEDICAL HISTORY: Past Medical History:  Diagnosis Date   Anemia    Back pain    BPES syndrome    Enlarged thyroid    Infertility, female    Obesity    Palpitations    Palpitations    PCOS (polycystic ovarian syndrome)    PONV (postoperative nausea and vomiting)    Prediabetes    Premature ovarian failure    Vitamin B 12 deficiency    Vitamin D deficiency    Wears eyeglasses     PAST SURGICAL HISTORY: Past Surgical History:  Procedure Laterality Date    EYE SURGERY Bilateral    NASAL SEPTOPLASTY W/ TURBINOPLASTY Bilateral 03/04/2017   Procedure: NASAL SEPTOPLASTY WITH BILATERAL INFERIOR TURBINATE REDUCTION;  Surgeon: Jerrell Belfast, MD;  Location: [facility];  Service: ENT;  Laterality: Bilateral;   NASAL SEPTUM SURGERY  03/04/2017   TONSILLECTOMY AND ADENOIDECTOMY Bilateral 03/04/2017   Procedure: TONSILLECTOMY;  Surgeon: Jerrell Belfast, MD;  Location: [facility];  Service: ENT;  Laterality: Bilateral;    SOCIAL HISTORY: Social History   Tobacco Use   Smoking status: Never Smoker   Smokeless tobacco: Never Used  Substance Use Topics   Alcohol use: Yes    Alcohol/week: 0.0 standard drinks    Comment: socially   Drug use: No    FAMILY HISTORY: Family History  Problem Relation Age of Onset   Hyperlipidemia Father    Hypertension Father    Heart disease Father    Sudden death Father        Age 108   Other Father        BPES   Heart attack Father    Other Other        BPES   Other Other        BPES   Hyperlipidemia Mother    Hypertension Mother    Diabetes Mother    Anxiety disorder Mother    Obesity Mother    Other Brother        BPES   Other Sister        BPES   Other Paternal Uncle        BPES   Other Cousin        BPES   Other Cousin        BPES   Heart attack Maternal Grandmother    Heart disease Maternal Grandfather    Heart attack Maternal Grandfather    Lung cancer Maternal Grandfather    Heart disease Paternal Grandfather heartr attack   Heart attack Paternal Grandfather 69       MI   Sudden death Paternal Grandfather    ROS: Review of Systems  Gastrointestinal: Negative for nausea and vomiting.  Musculoskeletal:       Negative for muscle weakness.   PHYSICAL EXAM: Blood pressure 130/82, pulse 60, temperature 98.3 F (36.8 C), temperature source Oral, height 5\' 8"  (1.727 m), weight (!) 319 lb (144.7 kg), SpO2 96 %. Body mass index is 48.5 kg/m. Physical  Exam Vitals signs reviewed.  Constitutional:      Appearance: Normal appearance. She is obese.  Cardiovascular:     Rate and Rhythm: Normal rate.     Pulses: Normal pulses.  Pulmonary:     Effort: Pulmonary effort is normal.     Breath sounds: Normal breath sounds.  Musculoskeletal: Normal range of motion.  Skin:  General: Skin is warm and dry.  Neurological:     Mental Status: She is alert and oriented to person, place, and time.  Psychiatric:        Behavior: Behavior normal.   RECENT LABS AND TESTS: BMET    Component Value Date/Time   NA 143 01/02/2019 1223   K 4.3 01/02/2019 1223   CL 104 01/02/2019 1223   CO2 23 01/02/2019 1223   GLUCOSE 86 01/02/2019 1223   GLUCOSE 89 05/25/2017 0843   BUN 14 01/02/2019 1223   CREATININE 0.60 01/02/2019 1223   CALCIUM 9.2 01/02/2019 1223   GFRNONAA 122 01/02/2019 1223   GFRAA 140 01/02/2019 1223   Lab Results  Component Value Date   HGBA1C 5.5 11/30/2018   HGBA1C 5.1 07/17/2018   HGBA1C 5.3 02/16/2018   HGBA1C 5.5 11/02/2017   HGBA1C 5.7 05/25/2017   Lab Results  Component Value Date   INSULIN 9.0 01/02/2019   INSULIN 15.0 07/17/2018   INSULIN 16.5 02/16/2018   INSULIN 35.6 (H) 11/02/2017   CBC    Component Value Date/Time   WBC 8.4 11/02/2017 0904   WBC 5.9 05/25/2017 0843   RBC 4.39 11/02/2017 0904   RBC 4.68 05/25/2017 0843   HGB 11.8 11/02/2017 0904   HCT 36.7 11/02/2017 0904   PLT 243.0 05/25/2017 0843   MCV 84 11/02/2017 0904   MCH 26.9 11/02/2017 0904   MCH 27.0 03/04/2017 1157   MCHC 32.2 11/02/2017 0904   MCHC 31.8 05/25/2017 0843   RDW 15.1 11/02/2017 0904   LYMPHSABS 2.4 11/02/2017 0904   MONOABS 0.6 05/25/2017 0843   EOSABS 0.3 11/02/2017 0904   BASOSABS 0.0 11/02/2017 0904   Iron/TIBC/Ferritin/ %Sat    Component Value Date/Time   IRON 30 11/02/2017 0904   TIBC 337 11/02/2017 0904   FERRITIN 28 11/02/2017 0904   IRONPCTSAT 9 (LL) 11/02/2017 0904   Lipid Panel     Component Value  Date/Time   CHOL 171 01/02/2019 1223   TRIG 132 01/02/2019 1223   HDL 50 01/02/2019 1223   CHOLHDL 3 05/25/2017 0843   VLDL 20.6 05/25/2017 0843   LDLCALC 95 01/02/2019 1223   Hepatic Function Panel     Component Value Date/Time   PROT 6.2 01/02/2019 1223   ALBUMIN 4.0 01/02/2019 1223   AST 17 01/02/2019 1223   ALT 33 (H) 01/02/2019 1223   ALKPHOS 72 01/02/2019 1223   BILITOT 0.3 01/02/2019 1223      Component Value Date/Time   TSH 1.41 11/30/2018 1210   TSH 2.700 11/02/2017 0904   TSH 1.97 05/25/2017 0843   Results for JAMEKIA, CISSE (MRN [id number]) as of 02/13/2019 16:34  Ref. Range 01/02/2019 12:23  Vitamin D, 25-Hydroxy Latest Ref Range: 30.0 - 100.0 ng/mL 45.2   OBESITY BEHAVIORAL INTERVENTION VISIT  Today's visit was #25  Starting weight: 363 lbs Starting date: 11/02/2017 Today's weight: 319 lbs Today's date: 02/13/2019 Total lbs lost to date: 44    02/13/2019  Height 5\' 8"  (1.727 m)  Weight 319 lb (144.7 kg) (A)  BMI (Calculated) 48.51  BLOOD PRESSURE - SYSTOLIC [id number]  BLOOD PRESSURE - DIASTOLIC 82   Body Fat % [id number] %  Total Body Water (lbs) 112.6 lbs   ASK: We discussed the diagnosis of obesity with Johnsie Cancel today and Tyara agreed to give [place] permission to discuss obesity behavioral modification therapy today.  ASSESS: Zanobia has the diagnosis of obesity and her BMI today is 48.6. Emryn is in  the action stage of change.  ADVISE: Jazzman was educated on the multiple health risks of obesity as well as the benefit of weight loss to improve her health. She was advised of the need for long term treatment and the importance of lifestyle modifications to improve her current health and to decrease her risk of future health problems.  AGREE: Multiple dietary modification options and treatment options were discussed and  Keatyn agreed to follow the recommendations documented in the above note.  ARRANGE: Romani was educated on the importance of  frequent visits to treat obesity as outlined per CMS and USPSTF guidelines and agreed to schedule her next follow up appointment today.  Migdalia Dk, am acting as transcriptionist for Abby Potash, PA-C I, Abby Potash, PA-C have reviewed above note and agree with its content

## 2019-02-20 ENCOUNTER — Encounter: Payer: Self-pay | Admitting: Family Medicine

## 2019-03-02 ENCOUNTER — Ambulatory Visit: Payer: 59 | Admitting: Psychology

## 2019-03-06 ENCOUNTER — Ambulatory Visit (INDEPENDENT_AMBULATORY_CARE_PROVIDER_SITE_OTHER): Payer: 59 | Admitting: Physician Assistant

## 2019-03-12 ENCOUNTER — Encounter (INDEPENDENT_AMBULATORY_CARE_PROVIDER_SITE_OTHER): Payer: Self-pay | Admitting: Physician Assistant

## 2019-03-14 ENCOUNTER — Ambulatory Visit (INDEPENDENT_AMBULATORY_CARE_PROVIDER_SITE_OTHER): Payer: 59 | Admitting: Physician Assistant

## 2019-03-14 ENCOUNTER — Other Ambulatory Visit: Payer: Self-pay

## 2019-03-14 ENCOUNTER — Encounter (INDEPENDENT_AMBULATORY_CARE_PROVIDER_SITE_OTHER): Payer: Self-pay | Admitting: Physician Assistant

## 2019-03-14 VITALS — BP 138/78 | HR 80 | Temp 98.6°F | Ht 68.0 in | Wt 324.0 lb

## 2019-03-14 DIAGNOSIS — Z6841 Body Mass Index (BMI) 40.0 and over, adult: Secondary | ICD-10-CM | POA: Diagnosis not present

## 2019-03-14 DIAGNOSIS — E559 Vitamin D deficiency, unspecified: Secondary | ICD-10-CM | POA: Diagnosis not present

## 2019-03-19 NOTE — Progress Notes (Signed)
Office: [phone]  /  Fax: [phone]   HPI:   Chief Complaint: OBESITY [name] is here to discuss her progress with her obesity treatment plan. She is on the keep a food journal with 1800 calories and 115 grams of protein daily plan and she is following her eating plan approximately 85 % of the time. She states she is doing cardio exercise and strength training 30 minutes 3 to 5 times per week. [name] reports that since starting her new job, her routine has been off and she has been snacking and eating out for lunch more often. Her weight is (!) 324 lb (147 kg) today and has had a weight gain of 5 pounds over a period of 4 weeks since her last visit. She has lost 39 lbs since starting treatment with [place].  Vitamin D deficiency [name] has a diagnosis of vitamin D deficiency. [name] is currently taking vit D and denies nausea, vomiting or muscle weakness.  ASSESSMENT AND PLAN:  Vitamin D deficiency  Class 3 severe obesity with serious comorbidity and body mass index (BMI) of 45.0 to 49.9 in adult, unspecified obesity type ([place])  PLAN:  Vitamin D Deficiency [name] was informed that low vitamin D levels contributes to fatigue and are associated with obesity, breast, and colon cancer. [name] will continue to take OTC Vit D @5 ,000 IU daily and she will follow up for routine testing of vitamin D, at least 2-3 times per year. She was informed of the risk of over-replacement of vitamin D and agrees to not increase her dose unless she discusses this with [place] first.  I spent > than 50% of the 25 minute visit on counseling as documented in the note.  Obesity [name] is currently in the action stage of change. As such, her goal is to continue with weight loss efforts She has agreed to keep a food journal with 1800 calories and 115 grams of protein daily [name] has been instructed to work up to a goal of 150 minutes of combined cardio and strengthening exercise per week for weight loss  and overall health benefits. We discussed the following Behavioral Modification Strategies today: keeping healthy foods in the home and work on meal planning and easy cooking plans  [name] has agreed to follow up with our clinic in 2 weeks. She was informed of the importance of frequent follow up visits to maximize her success with intensive lifestyle modifications for her multiple health conditions.  I spent > than 50% of the 25 minute visit on counseling as documented in the note.    ALLERGIES: No Known Allergies  MEDICATIONS: Current Outpatient Medications on File Prior to Visit  Medication Sig Dispense Refill   Cholecalciferol 5000 units capsule Take 1 capsule (5,000 Units total) by mouth daily. With meal 30 capsule 11   estradiol (CLIMARA - DOSED IN MG/24 HR) 0.1 mg/24hr patch Place 0.1 mg onto the skin. Apply and change patch twice weekly     medroxyPROGESTERone (PROVERA) 5 MG tablet Take 1 tablet by mouth every 30 (thirty) days. Pt takes 12 tablets a month.  11   metFORMIN (GLUCOPHAGE-XR) 500 MG 24 hr tablet Take 2 tablets (1,000 mg total) by mouth daily. 180 tablet 1   Multiple Vitamin (MULTIVITAMIN) capsule Take 1 capsule by mouth daily.     predniSONE (DELTASONE) 10 MG tablet TAKE 3 TABLETS PO QD FOR 3 DAYS THEN TAKE 2 TABLETS PO QD FOR 3 DAYS THEN TAKE 1 TABLET PO QD FOR 3 DAYS THEN  TAKE 1/2 TAB PO QD FOR 3 DAYS 20 tablet 0   PREMARIN 0.625 MG tablet      No current facility-administered medications on file prior to visit.     PAST MEDICAL HISTORY: Past Medical History:  Diagnosis Date   Anemia    Back pain    BPES syndrome    Enlarged thyroid    Infertility, female    Obesity    Palpitations    Palpitations    PCOS (polycystic ovarian syndrome)    PONV (postoperative nausea and vomiting)    Prediabetes    Premature ovarian failure    Vitamin B 12 deficiency    Vitamin D deficiency    Wears eyeglasses     PAST SURGICAL HISTORY: Past  Surgical History:  Procedure Laterality Date   EYE SURGERY Bilateral    NASAL SEPTOPLASTY W/ TURBINOPLASTY Bilateral 03/04/2017   Procedure: NASAL SEPTOPLASTY WITH BILATERAL INFERIOR TURBINATE REDUCTION;  Surgeon: Jerrell Belfast, MD;  Location: [facility];  Service: ENT;  Laterality: Bilateral;   NASAL SEPTUM SURGERY  03/04/2017   TONSILLECTOMY AND ADENOIDECTOMY Bilateral 03/04/2017   Procedure: TONSILLECTOMY;  Surgeon: Jerrell Belfast, MD;  Location: [facility];  Service: ENT;  Laterality: Bilateral;    SOCIAL HISTORY: Social History   Tobacco Use   Smoking status: Never Smoker   Smokeless tobacco: Never Used  Substance Use Topics   Alcohol use: Yes    Alcohol/week: 0.0 standard drinks    Comment: socially   Drug use: No    FAMILY HISTORY: Family History  Problem Relation Age of Onset   Hyperlipidemia Father    Hypertension Father    Heart disease Father    Sudden death Father        Age 61   Other Father        BPES   Heart attack Father    Other Other        BPES   Other Other        BPES   Hyperlipidemia Mother    Hypertension Mother    Diabetes Mother    Anxiety disorder Mother    Obesity Mother    Other Brother        BPES   Other Sister        BPES   Other Paternal Uncle        BPES   Other Cousin        BPES   Other Cousin        BPES   Heart attack Maternal Grandmother    Heart disease Maternal Grandfather    Heart attack Maternal Grandfather    Lung cancer Maternal Grandfather    Heart disease Paternal Grandfather heartr attack   Heart attack Paternal Grandfather 54       MI   Sudden death Paternal Grandfather     ROS: Review of Systems  Constitutional: Negative for weight loss.  Gastrointestinal: Negative for nausea and vomiting.  Musculoskeletal:       Negative for muscle weakness    PHYSICAL EXAM: Blood pressure 138/78, pulse 80, temperature 98.6 F (37 C), temperature source Oral, height 5\' 8"  (1.727 m),  weight (!) 324 lb (147 kg), SpO2 98 %. Body mass index is 49.26 kg/m. Physical Exam Vitals signs reviewed.  Constitutional:      Appearance: Normal appearance. She is well-developed. She is obese.  Cardiovascular:     Rate and Rhythm: Normal rate.  Pulmonary:     Effort: Pulmonary effort is  normal.  Musculoskeletal: Normal range of motion.  Skin:    General: Skin is warm and dry.  Neurological:     Mental Status: She is alert and oriented to person, place, and time.  Psychiatric:        Mood and Affect: Mood normal.        Behavior: Behavior normal.     RECENT LABS AND TESTS: BMET    Component Value Date/Time   NA 143 01/02/2019 1223   K 4.3 01/02/2019 1223   CL 104 01/02/2019 1223   CO2 23 01/02/2019 1223   GLUCOSE 86 01/02/2019 1223   GLUCOSE 89 05/25/2017 0843   BUN 14 01/02/2019 1223   CREATININE 0.60 01/02/2019 1223   CALCIUM 9.2 01/02/2019 1223   GFRNONAA 122 01/02/2019 1223   GFRAA 140 01/02/2019 1223   Lab Results  Component Value Date   HGBA1C 5.5 11/30/2018   HGBA1C 5.1 07/17/2018   HGBA1C 5.3 02/16/2018   HGBA1C 5.5 11/02/2017   HGBA1C 5.7 05/25/2017   Lab Results  Component Value Date   INSULIN 9.0 01/02/2019   INSULIN 15.0 07/17/2018   INSULIN 16.5 02/16/2018   INSULIN 35.6 (H) 11/02/2017   CBC    Component Value Date/Time   WBC 8.4 11/02/2017 0904   WBC 5.9 05/25/2017 0843   RBC 4.39 11/02/2017 0904   RBC 4.68 05/25/2017 0843   HGB 11.8 11/02/2017 0904   HCT 36.7 11/02/2017 0904   PLT 243.0 05/25/2017 0843   MCV 84 11/02/2017 0904   MCH 26.9 11/02/2017 0904   MCH 27.0 03/04/2017 1157   MCHC 32.2 11/02/2017 0904   MCHC 31.8 05/25/2017 0843   RDW 15.1 11/02/2017 0904   LYMPHSABS 2.4 11/02/2017 0904   MONOABS 0.6 05/25/2017 0843   EOSABS 0.3 11/02/2017 0904   BASOSABS 0.0 11/02/2017 0904   Iron/TIBC/Ferritin/ %Sat    Component Value Date/Time   IRON 30 11/02/2017 0904   TIBC 337 11/02/2017 0904   FERRITIN 28 11/02/2017 0904    IRONPCTSAT 9 (LL) 11/02/2017 0904   Lipid Panel     Component Value Date/Time   CHOL 171 01/02/2019 1223   TRIG 132 01/02/2019 1223   HDL 50 01/02/2019 1223   CHOLHDL 3 05/25/2017 0843   VLDL 20.6 05/25/2017 0843   LDLCALC 95 01/02/2019 1223   Hepatic Function Panel     Component Value Date/Time   PROT 6.2 01/02/2019 1223   ALBUMIN 4.0 01/02/2019 1223   AST 17 01/02/2019 1223   ALT 33 (H) 01/02/2019 1223   ALKPHOS 72 01/02/2019 1223   BILITOT 0.3 01/02/2019 1223      Component Value Date/Time   TSH 1.41 11/30/2018 1210   TSH 2.700 11/02/2017 0904   TSH 1.97 05/25/2017 0843     Ref. Range 01/02/2019 12:23  Vitamin D, 25-Hydroxy Latest Ref Range: 30.0 - 100.0 ng/mL 45.2    OBESITY BEHAVIORAL INTERVENTION VISIT  Today's visit was # 26  Starting weight: 363 lbs Starting date: 11/02/2017 Today's weight : 324 lbs Today's date: 03/14/2019 Total lbs lost to date: 39    03/14/2019  Height 5\' 8"  (1.727 m)  Weight 324 lb (147 kg) (A)  BMI (Calculated) 49.28  BLOOD PRESSURE - SYSTOLIC 0000000  BLOOD PRESSURE - DIASTOLIC 78   Body Fat % 123XX123 %    ASK: We discussed the diagnosis of obesity with [name] today and [name] agreed to give [place] permission to discuss obesity behavioral modification therapy today.  ASSESS: [name] has  the diagnosis of obesity and her BMI today is 49.28 [name] is in the action stage of change   ADVISE: [name] was educated on the multiple health risks of obesity as well as the benefit of weight loss to improve her health. She was advised of the need for long term treatment and the importance of lifestyle modifications to improve her current health and to decrease her risk of future health problems.  AGREE: Multiple dietary modification options and treatment options were discussed and  [name] agreed to follow the recommendations documented in the above note.  ARRANGE: [name] was educated on the importance of frequent visits to treat  obesity as outlined per CMS and USPSTF guidelines and agreed to schedule her next follow up appointment today.  Corey Skains, am acting as transcriptionist for Abby Potash, PA-C I, Abby Potash, PA-C have reviewed above note and agree with its content

## 2019-03-21 ENCOUNTER — Ambulatory Visit (INDEPENDENT_AMBULATORY_CARE_PROVIDER_SITE_OTHER): Payer: 59 | Admitting: Psychology

## 2019-03-21 DIAGNOSIS — F411 Generalized anxiety disorder: Secondary | ICD-10-CM

## 2019-03-27 ENCOUNTER — Other Ambulatory Visit: Payer: Self-pay

## 2019-03-27 ENCOUNTER — Encounter (INDEPENDENT_AMBULATORY_CARE_PROVIDER_SITE_OTHER): Payer: Self-pay | Admitting: Physician Assistant

## 2019-03-27 ENCOUNTER — Ambulatory Visit (INDEPENDENT_AMBULATORY_CARE_PROVIDER_SITE_OTHER): Payer: 59 | Admitting: Physician Assistant

## 2019-03-27 VITALS — BP 113/76 | HR 74 | Temp 98.4°F | Ht 68.0 in | Wt 320.0 lb

## 2019-03-27 DIAGNOSIS — R7303 Prediabetes: Secondary | ICD-10-CM

## 2019-03-27 DIAGNOSIS — Z6841 Body Mass Index (BMI) 40.0 and over, adult: Secondary | ICD-10-CM

## 2019-03-27 DIAGNOSIS — E559 Vitamin D deficiency, unspecified: Secondary | ICD-10-CM

## 2019-03-28 NOTE — Progress Notes (Signed)
Office: [phone]  /  Fax: [phone]   HPI:   Chief Complaint: OBESITY [name] is here to discuss her progress with her obesity treatment plan. She is keeping a food journal with 1800 calories and 115 grams of protein and is following her eating plan approximately 100% of the time. She states she is doing cardio/strengthening 30 minutes 5 times per week. [name] reports getting back on plan without any issues. 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 Her weight is (!) 320 lb (145.2 kg) today and has had a weight loss of 4 pounds over a period of 2 weeks since her last visit. She has lost 43 lbs since starting treatment with [place].  Vitamin D deficiency [name] has a diagnosis of Vitamin D deficiency. She is currently taking Vit D and denies nausea, vomiting or muscle weakness.  Pre-Diabetes [name] has a diagnosis of prediabetes based on her elevated Hgb A1c and was informed this puts her at greater risk of developing diabetes. She is taking metformin currently and continues to work on diet and exercise to decrease risk of diabetes. She denies nausea, vomiting, or diarrhea. No polyphagia.  ASSESSMENT AND PLAN:  Vitamin D deficiency  Prediabetes  Class 3 severe obesity with serious comorbidity and body mass index (BMI) of 45.0 to 49.9 in adult, unspecified obesity type ([place])  PLAN:  Vitamin D Deficiency [name] was informed that low Vitamin D levels contributes to fatigue and are associated with obesity, breast, and colon cancer. She agrees to continue taking Vit D and will follow-up for routine testing of Vitamin D, at least 2-3 times per year. She was informed of the risk of over-replacement of Vitamin D and agrees to not increase her dose unless she discusses this with [place] first. [name] agrees to follow-up with our clinic in 3 weeks.  Pre-Diabetes [name] will continue to work on weight loss, exercise, and decreasing simple carbohydrates in her diet  to help decrease the risk of diabetes. We dicussed metformin including benefits and risks. She was informed that eating too many simple carbohydrates or too many calories at one sitting increases the likelihood of GI side effects. [name] will continue metformin and weight loss. She will follow-up as directed to monitor her progress.  I spent > than 50% of the 25 minute visit on counseling as documented in the note.  Obesity [name] is currently in the action stage of change. As such, her goal is to continue with weight loss efforts. She has agreed to keep a food journal with 1800 calories and 115 grams of protein daily. [name] has been instructed to work up to a goal of 150 minutes of combined cardio and strengthening exercise per week for weight loss and overall health benefits. We discussed the following Behavioral Modification Strategies today: work on meal planning and easy cooking plans, and keeping healthy foods in the home.  [name] has agreed to follow-up with our clinic in 3 weeks. She was informed of the importance of frequent follow-up visits to maximize her success with intensive lifestyle modifications for her multiple health conditions.  I spent > than 50% of the 25 minute visit on counseling as documented in the note.    ALLERGIES: No Known Allergies  MEDICATIONS: Current Outpatient Medications on File Prior to Visit  Medication Sig Dispense Refill   Cholecalciferol 5000 units capsule Take 1 capsule (5,000 Units total) by mouth daily. With meal 30 capsule 11   estradiol (CLIMARA - DOSED IN MG/24  HR) 0.1 mg/24hr patch Place 0.1 mg onto the skin. Apply and change patch twice weekly     medroxyPROGESTERone (PROVERA) 5 MG tablet Take 1 tablet by mouth every 30 (thirty) days. Pt takes 12 tablets a month.  11   metFORMIN (GLUCOPHAGE-XR) 500 MG 24 hr tablet Take 2 tablets (1,000 mg total) by mouth daily. 180 tablet 1   Multiple Vitamin (MULTIVITAMIN) capsule Take 1 capsule  by mouth daily.     predniSONE (DELTASONE) 10 MG tablet TAKE 3 TABLETS PO QD FOR 3 DAYS THEN TAKE 2 TABLETS PO QD FOR 3 DAYS THEN TAKE 1 TABLET PO QD FOR 3 DAYS THEN TAKE 1/2 TAB PO QD FOR 3 DAYS 20 tablet 0   PREMARIN 0.625 MG tablet      No current facility-administered medications on file prior to visit.     PAST MEDICAL HISTORY: Past Medical History:  Diagnosis Date   Anemia    Back pain    BPES syndrome    Enlarged thyroid    Infertility, female    Obesity    Palpitations    Palpitations    PCOS (polycystic ovarian syndrome)    PONV (postoperative nausea and vomiting)    Prediabetes    Premature ovarian failure    Vitamin B 12 deficiency    Vitamin D deficiency    Wears eyeglasses     PAST SURGICAL HISTORY: Past Surgical History:  Procedure Laterality Date   EYE SURGERY Bilateral    NASAL SEPTOPLASTY W/ TURBINOPLASTY Bilateral 03/04/2017   Procedure: NASAL SEPTOPLASTY WITH BILATERAL INFERIOR TURBINATE REDUCTION;  Surgeon: Jerrell Belfast, MD;  Location: [facility];  Service: ENT;  Laterality: Bilateral;   NASAL SEPTUM SURGERY  03/04/2017   TONSILLECTOMY AND ADENOIDECTOMY Bilateral 03/04/2017   Procedure: TONSILLECTOMY;  Surgeon: Jerrell Belfast, MD;  Location: [facility];  Service: ENT;  Laterality: Bilateral;    SOCIAL HISTORY: Social History   Tobacco Use   Smoking status: Never Smoker   Smokeless tobacco: Never Used  Substance Use Topics   Alcohol use: Yes    Alcohol/week: 0.0 standard drinks    Comment: socially   Drug use: No    FAMILY HISTORY: Family History  Problem Relation Age of Onset   Hyperlipidemia Father    Hypertension Father    Heart disease Father    Sudden death Father        Age 64   Other Father        BPES   Heart attack Father    Other Other        BPES   Other Other        BPES   Hyperlipidemia Mother    Hypertension Mother    Diabetes Mother    Anxiety disorder Mother    Obesity Mother     Other Brother        BPES   Other Sister        BPES   Other Paternal Uncle        BPES   Other Cousin        BPES   Other Cousin        BPES   Heart attack Maternal Grandmother    Heart disease Maternal Grandfather    Heart attack Maternal Grandfather    Lung cancer Maternal Grandfather    Heart disease Paternal Grandfather heartr attack   Heart attack Paternal Grandfather 72       MI   Sudden death Paternal Grandfather  ROS: Review of Systems  Gastrointestinal: Negative for diarrhea, nausea and vomiting.  Musculoskeletal:       Negative for muscle weakness.  Endo/Heme/Allergies:       Negative for polyphagia.   PHYSICAL EXAM: Blood pressure 113/76, pulse 74, temperature 98.4 F (36.9 C), temperature source Oral, height 5\' 8"  (1.727 m), weight (!) 320 lb (145.2 kg), SpO2 97 %. Body mass index is 48.66 kg/m. Physical Exam Vitals signs reviewed.  Constitutional:      Appearance: Normal appearance. She is obese.  Cardiovascular:     Rate and Rhythm: Normal rate.     Pulses: Normal pulses.  Pulmonary:     Effort: Pulmonary effort is normal.     Breath sounds: Normal breath sounds.  Musculoskeletal: Normal range of motion.  Skin:    General: Skin is warm and dry.  Neurological:     Mental Status: She is alert and oriented to person, place, and time.  Psychiatric:        Behavior: Behavior normal.   RECENT LABS AND TESTS: BMET    Component Value Date/Time   NA 143 01/02/2019 1223   K 4.3 01/02/2019 1223   CL 104 01/02/2019 1223   CO2 23 01/02/2019 1223   GLUCOSE 86 01/02/2019 1223   GLUCOSE 89 05/25/2017 0843   BUN 14 01/02/2019 1223   CREATININE 0.60 01/02/2019 1223   CALCIUM 9.2 01/02/2019 1223   GFRNONAA 122 01/02/2019 1223   GFRAA 140 01/02/2019 1223   Lab Results  Component Value Date   HGBA1C 5.5 11/30/2018   HGBA1C 5.1 07/17/2018   HGBA1C 5.3 02/16/2018   HGBA1C 5.5 11/02/2017   HGBA1C 5.7 05/25/2017   Lab Results  Component  Value Date   INSULIN 9.0 01/02/2019   INSULIN 15.0 07/17/2018   INSULIN 16.5 02/16/2018   INSULIN 35.6 (H) 11/02/2017   CBC    Component Value Date/Time   WBC 8.4 11/02/2017 0904   WBC 5.9 05/25/2017 0843   RBC 4.39 11/02/2017 0904   RBC 4.68 05/25/2017 0843   HGB 11.8 11/02/2017 0904   HCT 36.7 11/02/2017 0904   PLT 243.0 05/25/2017 0843   MCV 84 11/02/2017 0904   MCH 26.9 11/02/2017 0904   MCH 27.0 03/04/2017 1157   MCHC 32.2 11/02/2017 0904   MCHC 31.8 05/25/2017 0843   RDW 15.1 11/02/2017 0904   LYMPHSABS 2.4 11/02/2017 0904   MONOABS 0.6 05/25/2017 0843   EOSABS 0.3 11/02/2017 0904   BASOSABS 0.0 11/02/2017 0904   Iron/TIBC/Ferritin/ %Sat    Component Value Date/Time   IRON 30 11/02/2017 0904   TIBC 337 11/02/2017 0904   FERRITIN 28 11/02/2017 0904   IRONPCTSAT 9 (LL) 11/02/2017 0904   Lipid Panel     Component Value Date/Time   CHOL 171 01/02/2019 1223   TRIG 132 01/02/2019 1223   HDL 50 01/02/2019 1223   CHOLHDL 3 05/25/2017 0843   VLDL 20.6 05/25/2017 0843   LDLCALC 95 01/02/2019 1223   Hepatic Function Panel     Component Value Date/Time   PROT 6.2 01/02/2019 1223   ALBUMIN 4.0 01/02/2019 1223   AST 17 01/02/2019 1223   ALT 33 (H) 01/02/2019 1223   ALKPHOS 72 01/02/2019 1223   BILITOT 0.3 01/02/2019 1223      Component Value Date/Time   TSH 1.41 11/30/2018 1210   TSH 2.700 11/02/2017 0904   TSH 1.97 05/25/2017 0843   Results for CARLE, VANDAELE (MRN [id number]) as of 03/28/2019 07:38  Ref.  Range 01/02/2019 12:23  Vitamin D, 25-Hydroxy Latest Ref Range: 30.0 - 100.0 ng/mL 45.2   OBESITY BEHAVIORAL INTERVENTION VISIT  Today's visit was #27  Starting weight: 363 lbs Starting date: 11/02/2017 Today's weight: 320 lbs  Today's date: 03/27/2019 Total lbs lost to date: 43    03/27/2019  Height 5\' 8"  (1.727 m)  Weight 320 lb (145.2 kg) (A)  BMI (Calculated) 48.67  BLOOD PRESSURE - SYSTOLIC 123456  BLOOD PRESSURE - DIASTOLIC 76   Body Fat % 52  %  Total Body Water (lbs) 114.4 lbs   ASK: We discussed the diagnosis of obesity with Johnsie Cancel today and Anetha agreed to give [place] permission to discuss obesity behavioral modification therapy today.  ASSESS: Sidney has the diagnosis of obesity and her BMI today is 48.7. Adraya is in the action stage of change.   ADVISE: [name] was educated on the multiple health risks of obesity as well as the benefit of weight loss to improve her health. She was advised of the need for long term treatment and the importance of lifestyle modifications to improve her current health and to decrease her risk of future health problems.  AGREE: Multiple dietary modification options and treatment options were discussed and  Kaylani agreed to follow the recommendations documented in the above note.  ARRANGE: [name] was educated on the importance of frequent visits to treat obesity as outlined per CMS and USPSTF guidelines and agreed to schedule her next follow up appointment today.  Migdalia Dk, am acting as transcriptionist for Abby Potash, PA-C I, Abby Potash, PA-C have reviewed above note and agree with its content

## 2019-04-10 ENCOUNTER — Encounter: Payer: Self-pay | Admitting: Family Medicine

## 2019-04-18 ENCOUNTER — Encounter (INDEPENDENT_AMBULATORY_CARE_PROVIDER_SITE_OTHER): Payer: Self-pay

## 2019-04-20 ENCOUNTER — Ambulatory Visit (INDEPENDENT_AMBULATORY_CARE_PROVIDER_SITE_OTHER): Payer: 59 | Admitting: Psychology

## 2019-04-20 DIAGNOSIS — F411 Generalized anxiety disorder: Secondary | ICD-10-CM

## 2019-04-23 ENCOUNTER — Other Ambulatory Visit: Payer: Self-pay

## 2019-04-23 ENCOUNTER — Ambulatory Visit (INDEPENDENT_AMBULATORY_CARE_PROVIDER_SITE_OTHER): Payer: 59 | Admitting: Physician Assistant

## 2019-04-23 VITALS — BP 122/78 | HR 77 | Temp 97.9°F | Ht 68.0 in | Wt 316.0 lb

## 2019-04-23 DIAGNOSIS — Z9189 Other specified personal risk factors, not elsewhere classified: Secondary | ICD-10-CM

## 2019-04-23 DIAGNOSIS — R7303 Prediabetes: Secondary | ICD-10-CM

## 2019-04-23 DIAGNOSIS — E7849 Other hyperlipidemia: Secondary | ICD-10-CM

## 2019-04-23 DIAGNOSIS — E559 Vitamin D deficiency, unspecified: Secondary | ICD-10-CM | POA: Diagnosis not present

## 2019-04-23 DIAGNOSIS — Z6841 Body Mass Index (BMI) 40.0 and over, adult: Secondary | ICD-10-CM

## 2019-04-23 NOTE — Progress Notes (Signed)
Office: [phone]  /  Fax: [phone]   HPI:   Chief Complaint: OBESITY [name] is here to discuss her progress with her obesity treatment plan. She is on the Category 4 plan with journaling and she is following her eating plan approximately 85 % of the time. She states she is walking and doing strength exercise 20 to 30 minutes 2 to 3 times per week. [name] did very well with weight loss. She reports that she is not having any struggles with the plan. Her weight is (!) 316 lb (143.3 kg) today and has had a weight loss of 4 pounds over a period of 4 weeks since her last visit. She has lost 47 lbs since starting treatment with [place].  Hyperlipidemia [name] has hyperlipidemia and she is not on medications. She has been trying to improve her cholesterol levels with intensive lifestyle modification including a low saturated fat diet, exercise and weight loss. She denies any chest pain.  Pre-Diabetes [name] has a diagnosis of prediabetes based on her elevated Hgb A1c and was informed this puts her at greater risk of developing diabetes. [name] is on metformin and she denies nausea, vomiting or diarrhea. She continues to work on diet and exercise to decrease risk of diabetes.   Vitamin D deficiency [name] has a diagnosis of vitamin D deficiency. [name] is currently taking OTC vit D 5,000 units daily and she denies nausea, vomiting or muscle weakness.  At risk for osteopenia and osteoporosis [name] is at higher risk of osteopenia and osteoporosis due to vitamin D deficiency.   ASSESSMENT AND PLAN:  Other hyperlipidemia - Plan: Lipid Panel With LDL/HDL Ratio  Prediabetes - Plan: Comprehensive metabolic panel, Hemoglobin A1c, Insulin, random  Vitamin D deficiency - Plan: VITAMIN D 25 Hydroxy (Vit-D Deficiency, Fractures)  At risk for osteoporosis  Class 3 severe obesity with serious comorbidity and body mass index (BMI) of 45.0 to 49.9 in adult, unspecified obesity type  ([name])  PLAN:  Hyperlipidemia [name] was informed of the American Heart Association Guidelines emphasizing intensive lifestyle modifications as the first line treatment for hyperlipidemia. We discussed many lifestyle modifications today in depth, and [name] will continue to work on decreasing saturated fats such as fatty red meat, butter and many fried foods. She will also increase vegetables and lean protein in her diet and continue to work on exercise and weight loss efforts. We will check labs and Coretha will follow up as directed.  Pre-Diabetes [name] will continue to work on weight loss, exercise, and decreasing simple carbohydrates in her diet to help decrease the risk of diabetes. We dicussed metformin including benefits and risks. She was informed that eating too many simple carbohydrates or too many calories at one sitting increases the likelihood of GI side effects. We will check labs and Dori will continue metformin and follow up with [place] as directed to monitor her progress.  Vitamin D Deficiency [name] was informed that low vitamin D levels contributes to fatigue and are associated with obesity, breast, and colon cancer. [name] will continue to take OTC Vit D @5 ,000 IU daily and she will follow up for routine testing of vitamin D, at least 2-3 times per year. She was informed of the risk of over-replacement of vitamin D and agrees to not increase her dose unless she discusses this with [place] first. We will check labs and [name] will follow up as directed.  At risk for osteopenia and osteoporosis [name] was given extended  (15 minutes) osteoporosis prevention counseling today.  [name] is at risk for osteopenia and osteoporosis due to her vitamin D deficiency. She was encouraged to take her vitamin D and follow her higher calcium diet and increase strengthening exercise to help strengthen her bones and decrease her risk of osteopenia and osteoporosis.  Obesity [name] is currently  in the action stage of change. As such, her goal is to continue with weight loss efforts She has agreed to keep a food journal with 1800 calories and 120 grams of protein daily [name] has been instructed to work up to a goal of 150 minutes of combined cardio and strengthening exercise per week for weight loss and overall health benefits. We discussed the following Behavioral Modification Strategies today: keeping healthy foods in the home and work on meal planning and easy cooking plans  [name] has agreed to follow up with our clinic in 2 to 3 weeks. She was informed of the importance of frequent follow up visits to maximize her success with intensive lifestyle modifications for her multiple health conditions.  ALLERGIES: No Known Allergies  MEDICATIONS: Current Outpatient Medications on File Prior to Visit  Medication Sig Dispense Refill   Cholecalciferol 5000 units capsule Take 1 capsule (5,000 Units total) by mouth daily. With meal 30 capsule 11   estradiol (CLIMARA - DOSED IN MG/24 HR) 0.1 mg/24hr patch Place 0.1 mg onto the skin. Apply and change patch twice weekly     medroxyPROGESTERone (PROVERA) 5 MG tablet Take 1 tablet by mouth every 30 (thirty) days. Pt takes 12 tablets a month.  11   metFORMIN (GLUCOPHAGE-XR) 500 MG 24 hr tablet Take 2 tablets (1,000 mg total) by mouth daily. 180 tablet 1   Multiple Vitamin (MULTIVITAMIN) capsule Take 1 capsule by mouth daily.     predniSONE (DELTASONE) 10 MG tablet TAKE 3 TABLETS PO QD FOR 3 DAYS THEN TAKE 2 TABLETS PO QD FOR 3 DAYS THEN TAKE 1 TABLET PO QD FOR 3 DAYS THEN TAKE 1/2 TAB PO QD FOR 3 DAYS 20 tablet 0   PREMARIN 0.625 MG tablet      No current facility-administered medications on file prior to visit.     PAST MEDICAL HISTORY: Past Medical History:  Diagnosis Date   Anemia    Back pain    BPES syndrome    Enlarged thyroid    Infertility, female    Obesity    Palpitations    Palpitations    PCOS  (polycystic ovarian syndrome)    PONV (postoperative nausea and vomiting)    Prediabetes    Premature ovarian failure    Vitamin B 12 deficiency    Vitamin D deficiency    Wears eyeglasses     PAST SURGICAL HISTORY: Past Surgical History:  Procedure Laterality Date   EYE SURGERY Bilateral    NASAL SEPTOPLASTY W/ TURBINOPLASTY Bilateral 03/04/2017   Procedure: NASAL SEPTOPLASTY WITH BILATERAL INFERIOR TURBINATE REDUCTION;  Surgeon: Jerrell Belfast, MD;  Location: [place] [facility];  Service: ENT;  Laterality: Bilateral;   NASAL SEPTUM SURGERY  03/04/2017   TONSILLECTOMY AND ADENOIDECTOMY Bilateral 03/04/2017   Procedure: TONSILLECTOMY;  Surgeon: Jerrell Belfast, MD;  Location: [place];  Service: ENT;  Laterality: Bilateral;    SOCIAL HISTORY: Social History   Tobacco Use   Smoking status: Never Smoker   Smokeless tobacco: Never Used  Substance Use Topics   Alcohol use: Yes    Alcohol/week: 0.0 standard drinks    Comment: socially   Drug use: No    FAMILY HISTORY: Family History  Problem Relation Age of Onset   Hyperlipidemia Father    Hypertension Father    Heart disease Father    Sudden death Father        Age 34   Other Father        BPES   Heart attack Father    Other Other        BPES   Other Other        BPES   Hyperlipidemia Mother    Hypertension Mother    Diabetes Mother    Anxiety disorder Mother    Obesity Mother    Other Brother        BPES   Other Sister        BPES   Other Paternal Uncle        BPES   Other Cousin        BPES   Other Cousin        BPES   Heart attack Maternal Grandmother    Heart disease Maternal Grandfather    Heart attack Maternal Grandfather    Lung cancer Maternal Grandfather    Heart disease Paternal Grandfather heartr attack   Heart attack Paternal Grandfather 69       MI   Sudden death Paternal Grandfather     ROS: Review of Systems  Constitutional: Positive for weight loss.   Cardiovascular: Negative for chest pain.  Gastrointestinal: Negative for nausea and vomiting.  Musculoskeletal:       Negative for muscle weakness    PHYSICAL EXAM: Blood pressure 122/78, pulse 77, temperature 97.9 F (36.6 C), temperature source Oral, height 5\' 8"  (1.727 m), weight (!) 316 lb (143.3 kg), SpO2 99 %. Body mass index is 48.05 kg/m. Physical Exam Vitals signs reviewed.  Constitutional:      Appearance: Normal appearance. She is well-developed. She is obese.  Cardiovascular:     Rate and Rhythm: Normal rate.  Pulmonary:     Effort: Pulmonary effort is normal.  Musculoskeletal: Normal range of motion.  Skin:    General: Skin is warm and dry.  Neurological:     Mental Status: She is alert and oriented to person, place, and time.  Psychiatric:        Mood and Affect: Mood normal.        Behavior: Behavior normal.     RECENT LABS AND TESTS: BMET    Component Value Date/Time   NA 143 01/02/2019 1223   K 4.3 01/02/2019 1223   CL 104 01/02/2019 1223   CO2 23 01/02/2019 1223   GLUCOSE 86 01/02/2019 1223   GLUCOSE 89 05/25/2017 0843   BUN 14 01/02/2019 1223   CREATININE 0.60 01/02/2019 1223   CALCIUM 9.2 01/02/2019 1223   GFRNONAA 122 01/02/2019 1223   GFRAA 140 01/02/2019 1223   Lab Results  Component Value Date   HGBA1C 5.5 11/30/2018   HGBA1C 5.1 07/17/2018   HGBA1C 5.3 02/16/2018   HGBA1C 5.5 11/02/2017   HGBA1C 5.7 05/25/2017   Lab Results  Component Value Date   INSULIN 9.0 01/02/2019   INSULIN 15.0 07/17/2018   INSULIN 16.5 02/16/2018   INSULIN 35.6 (H) 11/02/2017   CBC    Component Value Date/Time   WBC 8.4 11/02/2017 0904   WBC 5.9 05/25/2017 0843   RBC 4.39 11/02/2017 0904   RBC 4.68 05/25/2017 0843   HGB 11.8 11/02/2017 0904   HCT 36.7 11/02/2017 0904   PLT 243.0 05/25/2017 0843   MCV 84 11/02/2017 0904  MCH 26.9 11/02/2017 0904   MCH 27.0 03/04/2017 1157   MCHC 32.2 11/02/2017 0904   MCHC 31.8 05/25/2017 0843   RDW 15.1  11/02/2017 0904   LYMPHSABS 2.4 11/02/2017 0904   MONOABS 0.6 05/25/2017 0843   EOSABS 0.3 11/02/2017 0904   BASOSABS 0.0 11/02/2017 0904   Iron/TIBC/Ferritin/ %Sat    Component Value Date/Time   IRON 30 11/02/2017 0904   TIBC 337 11/02/2017 0904   FERRITIN 28 11/02/2017 0904   IRONPCTSAT 9 (LL) 11/02/2017 0904   Lipid Panel     Component Value Date/Time   CHOL 171 01/02/2019 1223   TRIG 132 01/02/2019 1223   HDL 50 01/02/2019 1223   CHOLHDL 3 05/25/2017 0843   VLDL 20.6 05/25/2017 0843   LDLCALC 95 01/02/2019 1223   Hepatic Function Panel     Component Value Date/Time   PROT 6.2 01/02/2019 1223   ALBUMIN 4.0 01/02/2019 1223   AST 17 01/02/2019 1223   ALT 33 (H) 01/02/2019 1223   ALKPHOS 72 01/02/2019 1223   BILITOT 0.3 01/02/2019 1223      Component Value Date/Time   TSH 1.41 11/30/2018 1210   TSH 2.700 11/02/2017 0904   TSH 1.97 05/25/2017 0843     Ref. Range 01/02/2019 12:23  Vitamin D, 25-Hydroxy Latest Ref Range: 30.0 - 100.0 ng/mL 45.2    OBESITY BEHAVIORAL INTERVENTION VISIT  Today's visit was # 28  Starting weight: 363 lbs Starting date: 11/02/2017 Today's weight : 316 lbs Today's date: 04/23/2019 Total lbs lost to date: 47    04/23/2019  Height 5\' 8"  (1.727 m)  Weight 316 lb (143.3 kg) (A)  BMI (Calculated) 48.06  BLOOD PRESSURE - SYSTOLIC 123XX123  BLOOD PRESSURE - DIASTOLIC 78   Body Fat % 99991111 %    ASK: We discussed the diagnosis of obesity with [name] today and [name] agreed to give [place] permission to discuss obesity behavioral modification therapy today.  ASSESS: [name] has the diagnosis of obesity and her BMI today is 48.06 [name] is in the action stage of change   ADVISE: [name] was educated on the multiple health risks of obesity as well as the benefit of weight loss to improve her health. She was advised of the need for long term treatment and the importance of lifestyle modifications to improve her current health and to  decrease her risk of future health problems.  AGREE: Multiple dietary modification options and treatment options were discussed and  [name] agreed to follow the recommendations documented in the above note.  ARRANGE: [name] was educated on the importance of frequent visits to treat obesity as outlined per CMS and USPSTF guidelines and agreed to schedule her next follow up appointment today.  Corey Skains, am acting as transcriptionist for Abby Potash, PA-C I, Abby Potash, PA-C have reviewed above note and agree with its content

## 2019-04-24 LAB — COMPREHENSIVE METABOLIC PANEL
ALT: 18 IU/L (ref 0–32)
AST: 11 IU/L (ref 0–40)
Albumin/Globulin Ratio: 1.9 (ref 1.2–2.2)
Albumin: 4.4 g/dL (ref 3.8–4.8)
Alkaline Phosphatase: 65 IU/L (ref 39–117)
BUN/Creatinine Ratio: 28 — ABNORMAL HIGH (ref 9–23)
BUN: 19 mg/dL (ref 6–20)
Bilirubin Total: 0.3 mg/dL (ref 0.0–1.2)
CO2: 25 mmol/L (ref 20–29)
Calcium: 9.4 mg/dL (ref 8.7–10.2)
Chloride: 104 mmol/L (ref 96–106)
Creatinine, Ser: 0.69 mg/dL (ref 0.57–1.00)
GFR calc Af Amer: 134 mL/min/{1.73_m2} (ref >59)
GFR calc non Af Amer: 116 mL/min/{1.73_m2} (ref >59)
Globulin, Total: 2.3 g/dL (ref 1.5–4.5)
Glucose: 90 mg/dL (ref 65–99)
Potassium: 4.4 mmol/L (ref 3.5–5.2)
Sodium: 141 mmol/L (ref 134–144)
Total Protein: 6.7 g/dL (ref 6.0–8.5)

## 2019-04-24 LAB — LIPID PANEL WITH LDL/HDL RATIO
Cholesterol, Total: 178 mg/dL (ref 100–199)
HDL: 50 mg/dL (ref >39)
LDL Chol Calc (NIH): 112 mg/dL — ABNORMAL HIGH (ref 0–99)
LDL/HDL Ratio: 2.2 ratio (ref 0.0–3.2)
Triglycerides: 89 mg/dL (ref 0–149)
VLDL Cholesterol Cal: 16 mg/dL (ref 5–40)

## 2019-04-24 LAB — VITAMIN D 25 HYDROXY (VIT D DEFICIENCY, FRACTURES): Vit D, 25-Hydroxy: 49.2 ng/mL (ref 30.0–100.0)

## 2019-04-24 LAB — HEMOGLOBIN A1C
Est. average glucose Bld gHb Est-mCnc: 103 mg/dL
Hgb A1c MFr Bld: 5.2 % (ref 4.8–5.6)

## 2019-04-24 LAB — INSULIN, RANDOM: INSULIN: 14.6 u[IU]/mL (ref 2.6–24.9)

## 2019-05-07 ENCOUNTER — Encounter: Payer: 59 | Admitting: Family Medicine

## 2019-05-16 ENCOUNTER — Ambulatory Visit (INDEPENDENT_AMBULATORY_CARE_PROVIDER_SITE_OTHER): Payer: 59 | Admitting: Psychology

## 2019-05-16 DIAGNOSIS — F411 Generalized anxiety disorder: Secondary | ICD-10-CM | POA: Diagnosis not present

## 2019-05-21 ENCOUNTER — Other Ambulatory Visit: Payer: Self-pay

## 2019-05-21 ENCOUNTER — Ambulatory Visit (INDEPENDENT_AMBULATORY_CARE_PROVIDER_SITE_OTHER): Payer: 59 | Admitting: Physician Assistant

## 2019-05-21 ENCOUNTER — Encounter (INDEPENDENT_AMBULATORY_CARE_PROVIDER_SITE_OTHER): Payer: Self-pay | Admitting: Physician Assistant

## 2019-05-21 VITALS — BP 121/76 | HR 63 | Temp 97.9°F | Ht 68.0 in | Wt 318.0 lb

## 2019-05-21 DIAGNOSIS — R7303 Prediabetes: Secondary | ICD-10-CM | POA: Diagnosis not present

## 2019-05-21 DIAGNOSIS — Z6841 Body Mass Index (BMI) 40.0 and over, adult: Secondary | ICD-10-CM

## 2019-05-21 NOTE — Progress Notes (Signed)
Office: [phone]  /  Fax: [phone]   HPI:   Chief Complaint: OBESITY [name] is here to discuss her progress with her obesity treatment plan. She is on the Category 4 plan and journaling and is following her eating plan approximately 80-85% of the time. She states she is doing cardio 30 minutes 1-2 times per week. [name] reports that she has not been journaling as much the past few weeks. She notes increased pain in her left knee and is going to see an orthopedist.  Her weight is (!) 318 lb (144.2 kg) today and has had a weight gain of 2 lbs since her last visit. She has lost 45 lbs since starting treatment with [place].  Pre-Diabetes [name] has a diagnosis of prediabetes based on her elevated Hgb A1c and was informed this puts her at greater risk of developing diabetes. She is taking metformin currently and continues to work on diet and exercise to decrease risk of diabetes. She denies nausea, vomiting, or diarrhea. No polyphagia.  ASSESSMENT AND PLAN:  Prediabetes  Class 3 severe obesity with serious comorbidity and body mass index (BMI) of 45.0 to 49.9 in adult, unspecified obesity type ([place])  PLAN:  Pre-Diabetes [name] will continue to work on weight loss, exercise, and decreasing simple carbohydrates in her diet to help decrease the risk of diabetes. We dicussed metformin including benefits and risks. She was informed that eating too many simple carbohydrates or too many calories at one sitting increases the likelihood of GI side effects. [name] will continue metformin and follow-up as directed to monitor her progress.  I spent > than 50% of the 30 minute visit (25 minutes) on counseling as documented in the note.  Obesity [name] is currently in the action stage of change. As such, her goal is to continue with weight loss efforts. She has agreed to keep a food journal with 1800 calories and 115 grams of protein daily. [name] has been instructed to work up to a goal of  150 minutes of combined cardio and strengthening exercise per week for weight loss and overall health benefits. We discussed the following Behavioral Modification Strategies today: work on meal planning and easy cooking plans, and keep a strict food journal.  [name] has agreed to follow-up with our clinic in 3 weeks. She was informed of the importance of frequent follow-up visits to maximize her success with intensive lifestyle modifications for her multiple health conditions.  I spent > than 50% of the 25 minute visit on counseling as documented in the note.   ALLERGIES: No Known Allergies  MEDICATIONS: Current Outpatient Medications on File Prior to Visit  Medication Sig Dispense Refill   Cholecalciferol 5000 units capsule Take 1 capsule (5,000 Units total) by mouth daily. With meal 30 capsule 11   estradiol (CLIMARA - DOSED IN MG/24 HR) 0.1 mg/24hr patch Place 0.1 mg onto the skin. Apply and change patch twice weekly     medroxyPROGESTERone (PROVERA) 5 MG tablet Take 1 tablet by mouth every 30 (thirty) days. Pt takes 12 tablets a month.  11   metFORMIN (GLUCOPHAGE-XR) 500 MG 24 hr tablet Take 2 tablets (1,000 mg total) by mouth daily. 180 tablet 1   Multiple Vitamin (MULTIVITAMIN) capsule Take 1 capsule by mouth daily.     predniSONE (DELTASONE) 10 MG tablet TAKE 3 TABLETS PO QD FOR 3 DAYS THEN TAKE 2 TABLETS PO QD FOR 3 DAYS THEN TAKE 1 TABLET PO QD FOR 3 DAYS THEN TAKE 1/2 TAB PO QD  FOR 3 DAYS 20 tablet 0   PREMARIN 0.625 MG tablet      No current facility-administered medications on file prior to visit.     PAST MEDICAL HISTORY: Past Medical History:  Diagnosis Date   Anemia    Back pain    BPES syndrome    Enlarged thyroid    Infertility, female    Obesity    Palpitations    Palpitations    PCOS (polycystic ovarian syndrome)    PONV (postoperative nausea and vomiting)    Prediabetes    Premature ovarian failure    Vitamin B 12 deficiency     Vitamin D deficiency    Wears eyeglasses     PAST SURGICAL HISTORY: Past Surgical History:  Procedure Laterality Date   EYE SURGERY Bilateral    NASAL SEPTOPLASTY W/ TURBINOPLASTY Bilateral 03/04/2017   Procedure: NASAL SEPTOPLASTY WITH BILATERAL INFERIOR TURBINATE REDUCTION;  Surgeon: Jerrell Belfast, MD;  Location: [place];  Service: ENT;  Laterality: Bilateral;   NASAL SEPTUM SURGERY  03/04/2017   TONSILLECTOMY AND ADENOIDECTOMY Bilateral 03/04/2017   Procedure: TONSILLECTOMY;  Surgeon: Jerrell Belfast, MD;  Location: [place];  Service: ENT;  Laterality: Bilateral;    SOCIAL HISTORY: Social History   Tobacco Use   Smoking status: Never Smoker   Smokeless tobacco: Never Used  Substance Use Topics   Alcohol use: Yes    Alcohol/week: 0.0 standard drinks    Comment: socially   Drug use: No    FAMILY HISTORY: Family History  Problem Relation Age of Onset   Hyperlipidemia Father    Hypertension Father    Heart disease Father    Sudden death Father        Age 51   Other Father        BPES   Heart attack Father    Other Other        BPES   Other Other        BPES   Hyperlipidemia Mother    Hypertension Mother    Diabetes Mother    Anxiety disorder Mother    Obesity Mother    Other Brother        BPES   Other Sister        BPES   Other Paternal Uncle        BPES   Other Cousin        BPES   Other Cousin        BPES   Heart attack Maternal Grandmother    Heart disease Maternal Grandfather    Heart attack Maternal Grandfather    Lung cancer Maternal Grandfather    Heart disease Paternal Grandfather heartr attack   Heart attack Paternal Grandfather 30       MI   Sudden death Paternal Grandfather    ROS: Review of Systems  Gastrointestinal: Negative for diarrhea, nausea and vomiting.  Endo/Heme/Allergies:       Negative for polyphagia.   PHYSICAL EXAM: Blood pressure 121/76, pulse 63, temperature 97.9 F (36.6 C),  temperature source Oral, height 5\' 8"  (1.727 m), weight (!) 318 lb (144.2 kg), SpO2 99 %. Body mass index is 48.35 kg/m. Physical Exam Vitals signs reviewed.  Constitutional:      Appearance: Normal appearance. She is obese.  Cardiovascular:     Rate and Rhythm: Normal rate.     Pulses: Normal pulses.  Pulmonary:     Effort: Pulmonary effort is normal.     Breath sounds: Normal breath  sounds.  Musculoskeletal: Normal range of motion.  Skin:    General: Skin is warm and dry.  Neurological:     Mental Status: She is alert and oriented to person, place, and time.  Psychiatric:        Behavior: Behavior normal.   RECENT LABS AND TESTS: BMET    Component Value Date/Time   NA 141 04/23/2019 0920   K 4.4 04/23/2019 0920   CL 104 04/23/2019 0920   CO2 25 04/23/2019 0920   GLUCOSE 90 04/23/2019 0920   GLUCOSE 89 05/25/2017 0843   BUN 19 04/23/2019 0920   CREATININE 0.69 04/23/2019 0920   CALCIUM 9.4 04/23/2019 0920   GFRNONAA 116 04/23/2019 0920   GFRAA 134 04/23/2019 0920   Lab Results  Component Value Date   HGBA1C 5.2 04/23/2019   HGBA1C 5.5 11/30/2018   HGBA1C 5.1 07/17/2018   HGBA1C 5.3 02/16/2018   HGBA1C 5.5 11/02/2017   Lab Results  Component Value Date   INSULIN 14.6 04/23/2019   INSULIN 9.0 01/02/2019   INSULIN 15.0 07/17/2018   INSULIN 16.5 02/16/2018   INSULIN 35.6 (H) 11/02/2017   CBC    Component Value Date/Time   WBC 8.4 11/02/2017 0904   WBC 5.9 05/25/2017 0843   RBC 4.39 11/02/2017 0904   RBC 4.68 05/25/2017 0843   HGB 11.8 11/02/2017 0904   HCT 36.7 11/02/2017 0904   PLT 243.0 05/25/2017 0843   MCV 84 11/02/2017 0904   MCH 26.9 11/02/2017 0904   MCH 27.0 03/04/2017 1157   MCHC 32.2 11/02/2017 0904   MCHC 31.8 05/25/2017 0843   RDW 15.1 11/02/2017 0904   LYMPHSABS 2.4 11/02/2017 0904   MONOABS 0.6 05/25/2017 0843   EOSABS 0.3 11/02/2017 0904   BASOSABS 0.0 11/02/2017 0904   Iron/TIBC/Ferritin/ %Sat    Component Value Date/Time    IRON 30 11/02/2017 0904   TIBC 337 11/02/2017 0904   FERRITIN 28 11/02/2017 0904   IRONPCTSAT 9 (LL) 11/02/2017 0904   Lipid Panel     Component Value Date/Time   CHOL 178 04/23/2019 0920   TRIG 89 04/23/2019 0920   HDL 50 04/23/2019 0920   CHOLHDL 3 05/25/2017 0843   VLDL 20.6 05/25/2017 0843   LDLCALC 112 (H) 04/23/2019 0920   Hepatic Function Panel     Component Value Date/Time   PROT 6.7 04/23/2019 0920   ALBUMIN 4.4 04/23/2019 0920   AST 11 04/23/2019 0920   ALT 18 04/23/2019 0920   ALKPHOS 65 04/23/2019 0920   BILITOT 0.3 04/23/2019 0920      Component Value Date/Time   TSH 1.41 11/30/2018 1210   TSH 2.700 11/02/2017 0904   TSH 1.97 05/25/2017 0843   Results for ANGLIA, [id number] (MRN [id number]) as of 05/21/2019 10:35  Ref. Range 04/23/2019 09:20  Vitamin D, 25-Hydroxy Latest Ref Range: 30.0 - 100.0 ng/mL 49.2   OBESITY BEHAVIORAL INTERVENTION VISIT  Today's visit was #29  Starting weight: 363 lbs Starting date: 11/02/2017 Today's weight: 318 lbs  Today's date: 05/21/2019 Total lbs lost to date: 45     05/21/2019  Height 5\' 8"  (1.727 m)  Weight 318 lb (144.2 kg) (A)  BMI (Calculated) 48.36  BLOOD PRESSURE - SYSTOLIC 123XX123  BLOOD PRESSURE - DIASTOLIC 76   Body Fat % 0000000 %  Total Body Water (lbs) 113.4 lbs   ASK: We discussed the diagnosis of obesity with Johnsie Cancel today and Jariah agreed to give [place] permission to discuss obesity behavioral modification  therapy today.  ASSESS: Marnette has the diagnosis of obesity and her BMI today is 48.4. Alisyn is in the action stage of change.   ADVISE: Andria was educated on the multiple health risks of obesity as well as the benefit of weight loss to improve her health. She was advised of the need for long term treatment and the importance of lifestyle modifications to improve her current health and to decrease her risk of future health problems.  AGREE: Multiple dietary modification options and treatment  options were discussed and  Seira agreed to follow the recommendations documented in the above note.  ARRANGE: Joeleen was educated on the importance of frequent visits to treat obesity as outlined per CMS and USPSTF guidelines and agreed to schedule her next follow up appointment today.  Migdalia Dk, am acting as transcriptionist for Abby Potash, PA-C I, Abby Potash, PA-C have reviewed above note and agree with its content

## 2019-05-22 ENCOUNTER — Encounter: Payer: Self-pay | Admitting: Family Medicine

## 2019-05-22 ENCOUNTER — Ambulatory Visit (INDEPENDENT_AMBULATORY_CARE_PROVIDER_SITE_OTHER): Payer: 59 | Admitting: Family Medicine

## 2019-05-22 VITALS — BP 112/75 | HR 67 | Temp 98.0°F | Resp 18 | Ht 69.25 in | Wt 322.5 lb

## 2019-05-22 DIAGNOSIS — E282 Polycystic ovarian syndrome: Secondary | ICD-10-CM

## 2019-05-22 DIAGNOSIS — R7303 Prediabetes: Secondary | ICD-10-CM

## 2019-05-22 DIAGNOSIS — Q103 Other congenital malformations of eyelid: Secondary | ICD-10-CM | POA: Diagnosis not present

## 2019-05-22 DIAGNOSIS — E559 Vitamin D deficiency, unspecified: Secondary | ICD-10-CM | POA: Diagnosis not present

## 2019-05-22 DIAGNOSIS — Z0000 Encounter for general adult medical examination without abnormal findings: Secondary | ICD-10-CM

## 2019-05-22 DIAGNOSIS — M25562 Pain in left knee: Secondary | ICD-10-CM | POA: Diagnosis not present

## 2019-05-22 DIAGNOSIS — G8929 Other chronic pain: Secondary | ICD-10-CM | POA: Diagnosis not present

## 2019-05-22 DIAGNOSIS — Z130 Encounter for screening for diseases of the blood and blood-forming organs and certain disorders involving the immune mechanism: Secondary | ICD-10-CM

## 2019-05-22 LAB — CBC
HCT: 39.8 % (ref 36.0–46.0)
Hemoglobin: 13 g/dL (ref 12.0–15.0)
MCHC: 32.7 g/dL (ref 30.0–36.0)
MCV: 86.4 fl (ref 78.0–100.0)
Platelets: 268 10*3/uL (ref 150.0–400.0)
RBC: 4.61 Mil/uL (ref 3.87–5.11)
RDW: 14.5 % (ref 11.5–15.5)
WBC: 6.7 10*3/uL (ref 4.0–10.5)

## 2019-05-22 MED ORDER — METFORMIN HCL ER 500 MG PO TB24: 1000.0000 mg | ORAL_TABLET | Freq: Every day | ORAL | 1 refills | Status: DC

## 2019-05-22 NOTE — Progress Notes (Signed)
Patient ID: [name], female  DOB: [birth year], 33 y.o.   MRN: [id number] Patient Care Team    Relationship Specialty Notifications Start End  Ma Hillock, DO PCP - General Family Medicine  04/25/15   Servando Salina, MD Consulting Physician Obstetrics and Gynecology  06/18/16   Wellington Hampshire, MD Consulting Physician Cardiology  06/18/16   Starlyn Skeans, MD Consulting Physician Family Medicine  05/26/18    Comment: Weight loss clinic    Chief Complaint  Patient presents with  . Annual Exam    Fasting. 06/2018 Pap Smear.     Subjective:  [name] is a 33 y.o.  Female  present for CPE. All past medical history, surgical history, allergies, family history, immunizations, medications and social history were updated in the electronic medical record today. All recent labs, ED visits and hospitalizations within the last year were reviewed.  Left knee pain: Patient complains today of left knee pain.  Patient reports her left knee was dislocated approximately 10 years ago.  She reports working through physical therapy at that time with fair improvement.  She states it is never been the same since that injury however over the last few months she has noticed an increase in discomfort.  She reports having more pain in her left lateral knee with activity, and even laying in bed.  She would like further evaluation on her knee pain.  prediabetes/morbid obesity/PCOS/BPES She is doing well. Reports compliance with  metformin 1500 mg QD. She is prescribed estrogen patches through gyn and using progesterone.  She has been dieting and exercising. She is following with Dr. Trixie Rude for her obesity.   Health maintenance: updated 05/22/19 Colonoscopy: No Fhx. Screen 50 Mammogram: no fhx screen 42 Cervical cancer screening: last pap: 06/2018,by: Dr. Garwin Brothers Immunizations: tdap 05/2015 UTD, Influenza 2020 UTD(encouraged yearly),  Infectious disease screening: HIV UTD DEXA:   Consider early screen with BPES, low vit D.  Assistive device: none Oxygen [id number] Patient has a Dental home. Hospitalizations/ED visits: reviewed  Depression screen [facility] 2/9 05/22/2019 11/30/2018 11/02/2017 05/25/2017 06/18/2016  Decreased Interest 0 0 3 0 0  Down, Depressed, Hopeless 0 0 2 0 0  PHQ - 2 Score 0 0 5 0 0  Altered sleeping - - 2 - -  Tired, decreased energy - - 3 - -  Change in appetite - - 3 - -  Feeling bad or failure about yourself  - - 3 - -  Trouble concentrating - - 1 - -  Moving slowly or fidgety/restless - - 1 - -  Suicidal thoughts - - 0 - -  PHQ-9 Score - - 18 - -  Difficult doing work/chores - - Somewhat difficult - -   No flowsheet data found.   Immunization History  Administered Date(s) Administered  . HPV 9-valent 08/01/2014, 09/10/2014, 01/28/2015  . Influenza,inj,Quad PF,6+ Mos 03/21/2014  . Influenza-Unspecified 03/22/2015, 03/15/2017, 04/06/2018  . Tdap 08/01/2005, 06/09/2015    Past Medical History:  Diagnosis Date  . Anemia   . Back pain   . BPES syndrome   . Enlarged thyroid   . Infertility, female   . Obesity   . Palpitations   . Palpitations   . PCOS (polycystic ovarian syndrome)   . PONV (postoperative nausea and vomiting)   . Prediabetes   . Premature ovarian failure   . Vitamin B 12 deficiency   . Vitamin D deficiency   . Wears eyeglasses    No Known  Allergies Past Surgical History:  Procedure Laterality Date  . EYE SURGERY Bilateral   . NASAL SEPTOPLASTY W/ TURBINOPLASTY Bilateral 03/04/2017   Procedure: NASAL SEPTOPLASTY WITH BILATERAL INFERIOR TURBINATE REDUCTION;  Surgeon: Jerrell Belfast, MD;  Location: [place];  Service: ENT;  Laterality: Bilateral;  . NASAL SEPTUM SURGERY  03/04/2017  . TONSILLECTOMY AND ADENOIDECTOMY Bilateral 03/04/2017   Procedure: TONSILLECTOMY;  Surgeon: Jerrell Belfast, MD;  Location: [facility] OR;  Service: ENT;  Laterality: Bilateral;   Family History  Problem Relation Age of Onset  .  Hyperlipidemia Father   . Hypertension Father   . Heart disease Father   . Sudden death Father        Age 69  . Other Father        BPES  . Heart attack Father   . Other Other        BPES  . Other Other        BPES  . Hyperlipidemia Mother   . Hypertension Mother   . Diabetes Mother   . Anxiety disorder Mother   . Obesity Mother   . Other Brother        BPES  . Other Sister        BPES  . Other Paternal Uncle        BPES  . Other Cousin        BPES  . Other Cousin        BPES  . Heart attack Maternal Grandmother   . Heart disease Maternal Grandfather   . Heart attack Maternal Grandfather   . Lung cancer Maternal Grandfather   . Heart disease Paternal Grandfather heartr attack  . Heart attack Paternal Grandfather 21       MI  . Sudden death Paternal Grandfather    Social History   Social History Narrative   [name] lives alone. 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    Allergies as of 05/22/2019   No Known Allergies     Medication List       Accurate as of May 22, 2019  9:19 AM. If you have any questions, ask your nurse or doctor.        STOP taking these medications   estradiol 0.1 mg/24hr patch Commonly known as: CLIMARA - Dosed in mg/24 hr Stopped by: Howard Pouch, DO   predniSONE 10 MG tablet Commonly known as: DELTASONE Stopped by: Howard Pouch, DO     TAKE these medications   Cholecalciferol 125 MCG (5000 UT) capsule Take 1 capsule (5,000 Units total) by mouth daily. With meal   medroxyPROGESTERone 5 MG tablet Commonly known as: PROVERA Take 1 tablet by mouth every 30 (thirty) days. Pt takes 12 tablets a month.   metFORMIN 500 MG 24 hr tablet Commonly known as: GLUCOPHAGE-XR Take 2 tablets (1,000 mg total) by mouth daily.   multivitamin capsule Take 1 capsule by mouth daily.   Premarin 0.625 MG tablet Generic drug: estrogens (conjugated)       All past medical history, surgical history, allergies,  family history, immunizations andmedications were updated in the EMR today and reviewed under the history and medication portions of their EMR.      ROS: 14 pt review of systems performed and negative (unless mentioned in an HPI)  Objective: BP 112/75 (BP Location: Left Arm, Patient Position: Sitting, Cuff Size: Large)   Pulse 67   Temp 98 F (36.7 C) (Temporal)   Resp  18   Ht 5' 9.25" (1.759 m)   Wt (!) 322 lb 8 oz (146.3 kg)   LMP 05/17/2019 (Exact Date)   SpO2 99%   BMI 47.28 kg/m  Gen: Afebrile. No acute distress. Nontoxic in appearance, well-developed, well-nourished, very pleasant, obese Caucasian female. HENT: AT. [place]. Bilateral TM visualized and normal in appearance, normal external auditory canal. MMM, no oral lesions, adequate dentition. Bilateral nares within normal limits. Throat without erythema, ulcerations or exudates.  No cough on exam, no hoarseness on exam. Eyes:Pupils Equal Round Reactive to light, Extraocular movements intact,  Conjunctiva without redness, discharge or icterus. Neck/lymp/endocrine: Supple, no lymphadenopathy, chronic thyromegaly CV: RRR no murmur, no edema, +2/4 P posterior tibialis pulses.  Chest: CTAB, no wheeze, rhonchi or crackles.  Normal respiratory effort.  Good air movement. Abd: Soft.  Obese. NTND. BS present.  No masses palpated. No hepatosplenomegaly. No rebound tenderness or guarding. Skin: No rashes, purpura or petechiae. Warm and well-perfused. Skin intact. Neuro/Msk:  Normal gait. PERLA. EOMi. Alert. Oriented x3.  Cranial nerves II through XII intact. Muscle strength 5/5 upper/lower extremity. DTRs equal bilaterally.   -Left knee: No erythema, no soft tissue swelling.  Bony prominence present tibial tuberosity.  Crepitus present on extension.  No ligament laxity.  Joint line tenderness medial and lateral, with lateral joint line more exquisitely tender.  Full range of motion present with discomfort on extension.  Neurovascularly intact  distally. Psych: Normal affect, dress and demeanor. Normal speech. Normal thought content and judgment.   No exam data present  Assessment/plan: [name] is a 33 y.o. female present for CPE Prediabetes Morbid obesity ([place]) PCBMI 50.0-59.9, adult ([place]) PCOS (polycystic ovarian syndrome) - 5.8--> 5.1--> 5.7--> 5.5 (completed 2 weeks ago) - metformin use mostly for PCOS. - Continued/refilled metformin 1500 mg total QD  - She is seeing weight management clinic. She has made dietary changes. She has lost some weight.  - continue f/u with GYN, tolerating estrogen patches - Routine exercise > 150 min a week encouraged  - reviewed all labs collected 04/2019 - F/U 6 mos.   BPES syndrome/Vitamin D deficiency DEXA ordered for h/o of BPES and Vit. D def.   Left knee pain: Discussed options with her.  Has been worsening over the last few months.  Original injury about 10 years ago.  Patient decided on orthopedic referral which was placed today. She was also encouraged to wear a neoprene sleeve on her knee during exercise and at work daily.  Encounter for preventive health examination Patient was encouraged to exercise greater than 150 minutes a week. Patient was encouraged to choose a diet filled with fresh fruits and vegetables, and lean meats. AVS provided to patient today for education/recommendation on gender specific health and safety maintenance. Colonoscopy: No Fhx. Screen 50 Mammogram: no fhx screen 42 Cervical cancer screening: last pap: 06/2018,by: Dr. Garwin Brothers Immunizations: tdap 05/2015 UTD, Influenza 2020 UTD(encouraged yearly),  Infectious disease screening: HIV UTD DEXA:  Consider early screen with BPES, low vit D. >> will attempt to order for baseline. Pt encouraged to ask if fully covered or copay.   Return in about 1 year (around 05/21/2020) for CPE (30 min).  Orders Placed This Encounter  Procedures  . DG Bone Density  . CBC  . Ambulatory referral to Orthopedic  Surgery     Annual exam completed today, as well as additional > 10 minutes spent with patient, > 50% of that time face to face discussing acute on chronic knee pain.  Electronically signed by: Howard Pouch, DO [place]

## 2019-05-22 NOTE — Patient Instructions (Signed)
Health Maintenance, Female Adopting a healthy lifestyle and getting preventive care are important in promoting health and wellness. Ask your health care provider about:  The right schedule for you to have regular tests and exams.  Things you can do on your own to prevent diseases and keep yourself healthy. What should I know about diet, weight, and exercise? Eat a healthy diet   Eat a diet that includes plenty of vegetables, fruits, low-fat dairy products, and lean protein.  Do not eat a lot of foods that are high in solid fats, added sugars, or sodium. Maintain a healthy weight Body mass index (BMI) is used to identify weight problems. It estimates body fat based on height and weight. Your health care provider can help determine your BMI and help you achieve or maintain a healthy weight. Get regular exercise Get regular exercise. This is one of the most important things you can do for your health. Most adults should:  Exercise for at least 150 minutes each week. The exercise should increase your heart rate and make you sweat (moderate-intensity exercise).  Do strengthening exercises at least twice a week. This is in addition to the moderate-intensity exercise.  Spend less time sitting. Even light physical activity can be beneficial. Watch cholesterol and blood lipids Have your blood tested for lipids and cholesterol at 31 years of age, then have this test every 5 years. Have your cholesterol levels checked more often if:  Your lipid or cholesterol levels are high.  You are older than 31 years of age.  You are at high risk for heart disease. What should I know about cancer screening? Depending on your health history and family history, you may need to have cancer screening at various ages. This may include screening for:  Breast cancer.  Cervical cancer.  Colorectal cancer.  Skin cancer.  Lung cancer. What should I know about heart disease, diabetes, and high blood  pressure? Blood pressure and heart disease  High blood pressure causes heart disease and increases the risk of stroke. This is more likely to develop in people who have high blood pressure readings, are of African descent, or are overweight.  Have your blood pressure checked: ? Every 3-5 years if you are 18-39 years of age. ? Every year if you are 40 years old or older. Diabetes Have regular diabetes screenings. This checks your fasting blood sugar level. Have the screening done:  Once every three years after age 40 if you are at a normal weight and have a low risk for diabetes.  More often and at a younger age if you are overweight or have a high risk for diabetes. What should I know about preventing infection? Hepatitis B If you have a higher risk for hepatitis B, you should be screened for this virus. Talk with your health care provider to find out if you are at risk for hepatitis B infection. Hepatitis C Testing is recommended for:  Everyone born from 1945 through 1965.  Anyone with known risk factors for hepatitis C. Sexually transmitted infections (STIs)  Get screened for STIs, including gonorrhea and chlamydia, if: ? You are sexually active and are younger than 31 years of age. ? You are older than 31 years of age and your health care provider tells you that you are at risk for this type of infection. ? Your sexual activity has changed since you were last screened, and you are at increased risk for chlamydia or gonorrhea. Ask your health care provider if   you are at risk.  Ask your health care provider about whether you are at high risk for HIV. Your health care provider may recommend a prescription medicine to help prevent HIV infection. If you choose to take medicine to prevent HIV, you should first get tested for HIV. You should then be tested every 3 months for as long as you are taking the medicine. Pregnancy  If you are about to stop having your period (premenopausal) and  you may become pregnant, seek counseling before you get pregnant.  Take 400 to 800 micrograms (mcg) of folic acid every day if you become pregnant.  Ask for birth control (contraception) if you want to prevent pregnancy. Osteoporosis and menopause Osteoporosis is a disease in which the bones lose minerals and strength with aging. This can result in bone fractures. If you are 65 years old or older, or if you are at risk for osteoporosis and fractures, ask your health care provider if you should:  Be screened for bone loss.  Take a calcium or vitamin D supplement to lower your risk of fractures.  Be given hormone replacement therapy (HRT) to treat symptoms of menopause. Follow these instructions at home: Lifestyle  Do not use any products that contain nicotine or tobacco, such as cigarettes, e-cigarettes, and chewing tobacco. If you need help quitting, ask your health care provider.  Do not use street drugs.  Do not share needles.  Ask your health care provider for help if you need support or information about quitting drugs. Alcohol use  Do not drink alcohol if: ? Your health care provider tells you not to drink. ? You are pregnant, may be pregnant, or are planning to become pregnant.  If you drink alcohol: ? Limit how much you use to 0-1 drink a day. ? Limit intake if you are breastfeeding.  Be aware of how much alcohol is in your drink. In the [place] one drink equals one 12 oz bottle of beer (355 mL), one 5 oz glass of wine (148 mL), or one 1 oz glass of hard liquor (44 mL). General instructions  Schedule regular health, dental, and eye exams.  Stay current with your vaccines.  Tell your health care provider if: ? You often feel depressed. ? You have ever been abused or do not feel safe at home. Summary  Adopting a healthy lifestyle and getting preventive care are important in promoting health and wellness.  Follow your health care provider's instructions about healthy  diet, exercising, and getting tested or screened for diseases.  Follow your health care provider's instructions on monitoring your cholesterol and blood pressure. This information is not intended to replace advice given to you by your health care provider. Make sure you discuss any questions you have with your health care provider. Document Released: 12/21/2010 Document Revised: 05/31/2018 Document Reviewed: 05/31/2018 Elsevier Patient Education  2020 [employer]  

## 2019-06-04 ENCOUNTER — Other Ambulatory Visit: Payer: Self-pay

## 2019-06-04 ENCOUNTER — Ambulatory Visit (INDEPENDENT_AMBULATORY_CARE_PROVIDER_SITE_OTHER): Payer: 59 | Admitting: Physician Assistant

## 2019-06-04 ENCOUNTER — Encounter (INDEPENDENT_AMBULATORY_CARE_PROVIDER_SITE_OTHER): Payer: Self-pay | Admitting: Physician Assistant

## 2019-06-04 VITALS — BP 136/80 | HR 68 | Temp 97.9°F | Ht 68.0 in | Wt 317.0 lb

## 2019-06-04 DIAGNOSIS — Z6841 Body Mass Index (BMI) 40.0 and over, adult: Secondary | ICD-10-CM

## 2019-06-04 DIAGNOSIS — E559 Vitamin D deficiency, unspecified: Secondary | ICD-10-CM

## 2019-06-05 ENCOUNTER — Ambulatory Visit: Payer: 59 | Admitting: Sports Medicine

## 2019-06-05 ENCOUNTER — Ambulatory Visit
Admission: RE | Admit: 2019-06-05 | Discharge: 2019-06-05 | Disposition: A | Discharge status: Home / Self Care | Payer: 59 | Source: Ambulatory Visit | Attending: Sports Medicine | Admitting: Sports Medicine

## 2019-06-05 ENCOUNTER — Encounter: Payer: Self-pay | Admitting: Sports Medicine

## 2019-06-05 VITALS — BP 124/74 | Ht 68.0 in | Wt 317.0 lb

## 2019-06-05 DIAGNOSIS — M25562 Pain in left knee: Secondary | ICD-10-CM | POA: Diagnosis not present

## 2019-06-05 DIAGNOSIS — M25462 Effusion, left knee: Secondary | ICD-10-CM | POA: Diagnosis not present

## 2019-06-05 DIAGNOSIS — M2141 Flat foot [pes planus] (acquired), right foot: Secondary | ICD-10-CM

## 2019-06-05 DIAGNOSIS — M2142 Flat foot [pes planus] (acquired), left foot: Secondary | ICD-10-CM | POA: Diagnosis not present

## 2019-06-05 DIAGNOSIS — G8929 Other chronic pain: Secondary | ICD-10-CM | POA: Diagnosis not present

## 2019-06-05 DIAGNOSIS — M216X1 Other acquired deformities of right foot: Secondary | ICD-10-CM

## 2019-06-05 DIAGNOSIS — M216X2 Other acquired deformities of left foot: Secondary | ICD-10-CM | POA: Diagnosis not present

## 2019-06-05 DIAGNOSIS — M1712 Unilateral primary osteoarthritis, left knee: Secondary | ICD-10-CM | POA: Diagnosis not present

## 2019-06-05 NOTE — Patient Instructions (Signed)
Your knee pain is likely multifactorial caused by an injury to the lateral meniscus as well as increased stress on the lateral portion of your knee from your gait where your toes are turned in your feet are in pronation. -I have ordered an x-ray of your knee.  Get this done at your convenience and we can discuss it at your next visit -Having custom orthotics made to correct your gait abnormalities will likely improve your knee pain is as well decrease the amount of stress put on the lateral portion of your knee -Schedule your next visit your earliest convenience and we can make custom orthotics for you as well as go over the x-ray of your knee -Work on the exercises shown to you at today's visit.  Continue working on the exercises you are doing at home however let pain be your guide as far as what exercises to do.  Avoid activities that aggravate your pain such as deep squats, lunges, kneeling. -If you have any questions or concerns prior to her next visit please do not hesitate to call us

## 2019-06-05 NOTE — Progress Notes (Addendum)
PCP: Ma Hillock, DO  Subjective:   HPI: Patient is a 33 y.o. female here for evaluation of left knee pain.  Patient notes the pain is been present for the last several months.  She denies any specific injury or trauma recently however does have a history of traumatic dislocation the patella back in 2009.  Patient notes she has been working on losing weight and has recently started an exercise program which she believes has aggravated her knee pain.  Deep squats, lunges, kneeling aggravate her knee pain.  The pain is located on the lateral aspect of her knee.  The pain does not radiate.  She denies any mechanical symptoms.  She denies any bruising or swelling.  She has no numbness or tingling.  Patient notes the pain has a sharp stabbing quality to it.  Currently her pain is a 2 out of 10 however activity will worsen the pain.  She has iced her knee as well as taking over-the-counter anti-inflammatories which has minimally helped improve her pain.  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  This seems to aggravate her pain as well.   Review of Systems: See HPI above.  Past Medical History:  Diagnosis Date  . Anemia   . Back pain   . BPES syndrome   . Enlarged thyroid   . Infertility, female   . Knee dislocation 2010   Left  . Obesity   . Palpitations   . PCOS (polycystic ovarian syndrome)   . PONV (postoperative nausea and vomiting)   . Prediabetes   . Premature ovarian failure   . Vitamin B 12 deficiency   . Vitamin D deficiency   . Wears eyeglasses     Current Outpatient Medications on File Prior to Visit  Medication Sig Dispense Refill  . Cholecalciferol 5000 units capsule Take 1 capsule (5,000 Units total) by mouth daily. With meal 30 capsule 11  . medroxyPROGESTERone (PROVERA) 5 MG tablet Take 1 tablet by mouth every 30 (thirty) days. Pt takes 12 tablets a month.  11  . metFORMIN (GLUCOPHAGE-XR) 500  MG 24 hr tablet Take 2 tablets (1,000 mg total) by mouth daily. 180 tablet 1  . Multiple Vitamin (MULTIVITAMIN) capsule Take 1 capsule by mouth daily.    [name] PREMARIN 0.625 MG tablet      No current facility-administered medications on file prior to visit.    Past Surgical History:  Procedure Laterality Date  . EYE SURGERY Bilateral   . NASAL SEPTOPLASTY W/ TURBINOPLASTY Bilateral 03/04/2017   Procedure: NASAL SEPTOPLASTY WITH BILATERAL INFERIOR TURBINATE REDUCTION;  Surgeon: Jerrell Belfast, MD;  Location: [place];  Service: ENT;  Laterality: Bilateral;  . NASAL SEPTUM SURGERY  03/04/2017  . TONSILLECTOMY AND ADENOIDECTOMY Bilateral 03/04/2017   Procedure: TONSILLECTOMY;  Surgeon: Jerrell Belfast, MD;  Location: [facility];  Service: ENT;  Laterality: Bilateral;    No Known Allergies  Social History   Socioeconomic History  . Marital status: Significant Other    Spouse name: Coralee North  . Number of children: 0  . Years of education: Not on file  . Highest education level: Not on file  Occupational History  . Occupation: Counselling psychologist: [employer]    Comment:  step-down, med surg  Tobacco Use  . Smoking status: Never Smoker  . Smokeless tobacco: Never Used  Substance and Sexual Activity  . Alcohol use: Yes  Alcohol/week: 0.0 standard drinks    Comment: socially  . Drug use: No  . Sexual activity: Never  Other Topics Concern  . Not on file  Social History Narrative   [name] lives alone. 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   Social Determinants of Health   Financial Resource Strain:   . Difficulty of Paying Living Expenses: Not on file  Food Insecurity:   . Worried About Charity fundraiser in the Last Year: Not on file  . Ran Out of Food in the Last Year: Not on file  Transportation Needs:   . Lack of Transportation (Medical): Not on file  . Lack of Transportation (Non-Medical): Not on file  Physical Activity:   . Days  of Exercise per Week: Not on file  . Minutes of Exercise per Session: Not on file  Stress:   . Feeling of Stress : Not on file  Social Connections:   . Frequency of Communication with Friends and Family: Not on file  . Frequency of Social Gatherings with Friends and Family: Not on file  . Attends Religious Services: Not on file  . Active Member of Clubs or Organizations: Not on file  . Attends Archivist Meetings: Not on file  . Marital Status: Not on file  Intimate Partner Violence:   . Fear of Current or Ex-Partner: Not on file  . Emotionally Abused: Not on file  . Physically Abused: Not on file  . Sexually Abused: Not on file    Family History  Problem Relation Age of Onset  . Hyperlipidemia Father   . Hypertension Father   . Heart disease Father   . Sudden death Father        Age 18  . Other Father        BPES  . Heart attack Father   . Other Other        BPES  . Other Other        BPES  . Hyperlipidemia Mother   . Hypertension Mother   . Diabetes Mother   . Anxiety disorder Mother   . Obesity Mother   . Other Brother        BPES  . Other Sister        BPES  . Other Paternal Uncle        BPES  . Other Cousin        BPES  . Other Cousin        BPES  . Heart attack Maternal Grandmother   . Heart disease Maternal Grandfather   . Heart attack Maternal Grandfather   . Lung cancer Maternal Grandfather   . Heart disease Paternal Grandfather heartr attack  . Heart attack Paternal Grandfather 2       MI  . Sudden death Paternal Grandfather         Objective:  Physical Exam: BP 124/74   Ht 5\' 8"  (1.727 m)   Wt (!) 317 lb (143.8 kg)   LMP 05/17/2019 (Exact Date)   BMI 48.20 kg/m  Gen: NAD, comfortable in exam room Lungs: Breathing comfortably on room air Knee Exam Left -Inspection: no deformity, no discoloration -Palpation: Lateral joint line tenderness -ROM: Extension: -10 degrees; Flexion: 130 degrees -Strength: Extension: 5/5; Flexion:  5/5 -Special Tests: Varus Stress: Negative; Valgus Stress: Negative; Lachman: Negative; Posterior drawer: Negative; McMurray: Positive; Thessaly: Positive -Limb neurovascularly intact, no instability noted  Contralateral Knee -Inspection: no deformity,  no discoloration -Palpation: medial joint line: No tenderness palpation -ROM: Extension: -10 degrees; Flexion: 130 degrees -Strength: Extension: 5/5; Flexion: 5/5 -Limb neurovascularly intact, no instability noted  Ankle/Foot Exam Bilateral -Inspection: Flattening both the longitudinal and transverse arches.  Forefoot and pronation.  Significant calcaneal valgus.  Patient standing in inversion -ROM: Normal ROM with dorsiflexion, plantarflexion, inversion, eversion  -Gait: Significant pronation gait with inversion of the foot   Limited diagnostic ultrasound of the left knee Findings: -Normal appearance of the quadriceps and patellar tendons -Minimal fluid noted within the suprapatellar pouch -Normal appearance of the medial meniscus. -No degenerative changes noted within the medial compartment -Fraying of the lateral meniscus noted along with bone spurs and degenerative changes within the lateral compartment -Perimeniscal cyst noted by the lateral meniscus along with edema surrounding the lateral meniscus Impression: -Ultrasound findings consistent with degenerative changes of the lateral compartment and possible injury to the lateral meniscus   Assessment & Plan:  Patient is a 33 y.o. female here for evaluation of left knee pain  1.  Left knee pain -Patient with possible injury to the lateral meniscus as well as foot gait abnormalities leading to increased stress in the lateral aspect of the knee -X-rays of the knee were ordered.  These will be discussed with patient at her next visit -Patient will likely benefit from custom orthotics to help decrease the stress put on lateral portion of the knee.  Patient will follow-up after the  new year to have custom orthotics made.  Patient will likely benefit from metatarsal pad as well due to flattening of the transverse arch and help decrease pressure on the lateral aspect of her shoe as this is the portion that seems to wear out the fastest -Patient was instructed to avoid activities that aggravate her pain such as deep squats, kneeling, lunges -Patient was given home exercise program to work on quad strengthening -Patient may benefit from MRI in the future if conservative management does not improve her pain  2.  Pes planus deformity with pronation gait abnormality -Orthotics will be made for patient as described above -Patient will bring her work shoes at her appointment to make orthotics with these can be custom fitted for her  Patient will follow up after the new year to have custom orthotics made to go over the results of the x-ray  Patient seen and evaluated with the sports medicine fellow.  I agree with the above plan of care.  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  She has a history of left knee patellar dislocation and is now experiencing pain along the lateral aspect of her left knee.  She has significant pronation with walking.  I agree with treatment as above.  We will start with getting some x-rays and the patient will return to the office in a few weeks for custom orthotics.  Addendum: X-rays reviewed.  Patient has mild arthritic change in both the patellofemoral joint and lateral compartment.  These changes are advanced for her age and are likely due to her previous trauma.  Patient will return to the office the first week of January for custom orthotics.

## 2019-06-06 NOTE — Progress Notes (Signed)
Office: [phone]  /  Fax: [phone]   HPI:  Chief Complaint: OBESITY [name] is here to discuss her progress with her obesity treatment plan. She is on the keep a food journal with 1800 calories and 115 grams of protein daily and the Category 4 plan and states she is following her eating plan approximately 80 % of the time. She states she is doing cardio exercise 30 minutes 2 to 3 times per week.  [name] reports that she traveled recently and did well with her plan most of the time. She indulged in pizza one night. She is asking about strategies for upcoming Christmas gatherings.  Vitamin D deficiency [name] has a diagnosis of vitamin D deficiency. She is on OTC vitamin D. She has no nausea, vomiting or muscle weakness. [name] has no excessive fatigue.   Today's visit was # 30 Starting weight: 363 lbs Starting date: 11/02/2017 Today's weight : 317 lbs Today's date: 06/04/2019 Total lbs lost to date: 54 Total lbs lost since last in-office visit: 1  ASSESSMENT AND PLAN:  Vitamin D deficiency  Class 3 severe obesity with serious comorbidity and body mass index (BMI) of 45.0 to 49.9 in adult, unspecified obesity type (HCC)  PLAN:  Vitamin D Deficiency Low vitamin D level contributes to fatigue and are associated with obesity, breast, and colon cancer. She agrees to continue to take prescription Vit D @50 ,000 IU every week and will follow up for routine testing of vitamin D, at least 2-3 times per year to avoid over-replacement.  Obesity [name] is currently in the action stage of change. As such, her goal is to continue with weight loss efforts She has agreed to keep a food journal with 1800 calories and 115 grams of protein daily [name] has been instructed to work up to a goal of 150 minutes of combined cardio and strengthening exercise per week for weight loss and overall health benefits. We discussed the following Behavioral Modification Strategies today: planning  for success and holiday eating strategies   [name] has agreed to follow up with our clinic in 3 weeks. She was informed of the importance of frequent follow up visits to maximize her success with intensive lifestyle modifications for her multiple health conditions.  I spent > than 50% of the 28 minute visit on counseling as documented in the note.    ALLERGIES: No Known Allergies  MEDICATIONS: Current Outpatient Medications on File Prior to Visit  Medication Sig Dispense Refill  . Cholecalciferol 5000 units capsule Take 1 capsule (5,000 Units total) by mouth daily. With meal 30 capsule 11  . medroxyPROGESTERone (PROVERA) 5 MG tablet Take 1 tablet by mouth every 30 (thirty) days. Pt takes 12 tablets a month.  11  . metFORMIN (GLUCOPHAGE-XR) 500 MG 24 hr tablet Take 2 tablets (1,000 mg total) by mouth daily. 180 tablet 1  . Multiple Vitamin (MULTIVITAMIN) capsule Take 1 capsule by mouth daily.    [name] PREMARIN 0.625 MG tablet      No current facility-administered medications on file prior to visit.    PAST MEDICAL HISTORY: Past Medical History:  Diagnosis Date  . Anemia   . Back pain   . BPES syndrome   . Enlarged thyroid   . Infertility, female   . Knee dislocation 2010   Left  . Obesity   . Palpitations   . PCOS (polycystic ovarian syndrome)   . PONV (postoperative nausea and vomiting)   . Prediabetes   . Premature ovarian failure   .  Vitamin B 12 deficiency   . Vitamin D deficiency   . Wears eyeglasses     PAST SURGICAL HISTORY: Past Surgical History:  Procedure Laterality Date  . EYE SURGERY Bilateral   . NASAL SEPTOPLASTY W/ TURBINOPLASTY Bilateral 03/04/2017   Procedure: NASAL SEPTOPLASTY WITH BILATERAL INFERIOR TURBINATE REDUCTION;  Surgeon: Jerrell Belfast, MD;  Location: [facility];  Service: ENT;  Laterality: Bilateral;  . NASAL SEPTUM SURGERY  03/04/2017  . TONSILLECTOMY AND ADENOIDECTOMY Bilateral 03/04/2017   Procedure: TONSILLECTOMY;  Surgeon: Jerrell Belfast,  MD;  Location: [facility];  Service: ENT;  Laterality: Bilateral;    SOCIAL HISTORY: Social History   Tobacco Use  . Smoking status: Never Smoker  . Smokeless tobacco: Never Used  Substance Use Topics  . Alcohol use: Yes    Alcohol/week: 0.0 standard drinks    Comment: socially  . Drug use: No    FAMILY HISTORY: Family History  Problem Relation Age of Onset  . Hyperlipidemia Father   . Hypertension Father   . Heart disease Father   . Sudden death Father        Age 30  . Other Father        BPES  . Heart attack Father   . Other Other        BPES  . Other Other        BPES  . Hyperlipidemia Mother   . Hypertension Mother   . Diabetes Mother   . Anxiety disorder Mother   . Obesity Mother   . Other Brother        BPES  . Other Sister        BPES  . Other Paternal Uncle        BPES  . Other Cousin        BPES  . Other Cousin        BPES  . Heart attack Maternal Grandmother   . Heart disease Maternal Grandfather   . Heart attack Maternal Grandfather   . Lung cancer Maternal Grandfather   . Heart disease Paternal Grandfather heartr attack  . Heart attack Paternal Grandfather 14       MI  . Sudden death Paternal Grandfather     ROS: Review of Systems  Constitutional: Positive for weight loss. Negative for malaise/fatigue.  Gastrointestinal: Negative for nausea and vomiting.  Musculoskeletal:       Negative for muscle weakness    PHYSICAL EXAM: Blood pressure 136/80, pulse 68, temperature 97.9 F (36.6 C), temperature source Oral, height 5\' 8"  (1.727 m), weight (!) 317 lb (143.8 kg), last menstrual period 05/17/2019, SpO2 98 %. Body mass index is 48.2 kg/m. Physical Exam Vitals reviewed.  Constitutional:      General: She is not in acute distress.    Appearance: Normal appearance. She is well-developed. She is obese.  Cardiovascular:     Rate and Rhythm: Normal rate.  Pulmonary:     Effort: Pulmonary effort is normal.  Musculoskeletal:        General:  Normal range of motion.  Skin:    General: Skin is warm and dry.  Neurological:     Mental Status: She is alert and oriented to person, place, and time.  Psychiatric:        Mood and Affect: Mood normal.        Behavior: Behavior normal.     RECENT LABS AND TESTS: BMET    Component Value Date/Time   NA 141 04/23/2019 0920  K 4.4 04/23/2019 0920   CL 104 04/23/2019 0920   CO2 25 04/23/2019 0920   GLUCOSE 90 04/23/2019 0920   GLUCOSE 89 05/25/2017 0843   BUN 19 04/23/2019 0920   CREATININE 0.69 04/23/2019 0920   CALCIUM 9.4 04/23/2019 0920   GFRNONAA 116 04/23/2019 0920   GFRAA 134 04/23/2019 0920   Lab Results  Component Value Date   HGBA1C 5.2 04/23/2019   HGBA1C 5.5 11/30/2018   HGBA1C 5.1 07/17/2018   HGBA1C 5.3 02/16/2018   HGBA1C 5.5 11/02/2017   Lab Results  Component Value Date   INSULIN 14.6 04/23/2019   INSULIN 9.0 01/02/2019   INSULIN 15.0 07/17/2018   INSULIN 16.5 02/16/2018   INSULIN 35.6 (H) 11/02/2017   CBC    Component Value Date/Time   WBC 6.7 05/22/2019 0936   RBC 4.61 05/22/2019 0936   HGB 13.0 05/22/2019 0936   HGB 11.8 11/02/2017 0904   HCT 39.8 05/22/2019 0936   HCT 36.7 11/02/2017 0904   PLT 268.0 05/22/2019 0936   MCV 86.4 05/22/2019 0936   MCV 84 11/02/2017 0904   MCH 26.9 11/02/2017 0904   MCH 27.0 03/04/2017 1157   MCHC 32.7 05/22/2019 0936   RDW 14.5 05/22/2019 0936   RDW 15.1 11/02/2017 0904   LYMPHSABS 2.4 11/02/2017 0904   MONOABS 0.6 05/25/2017 0843   EOSABS 0.3 11/02/2017 0904   BASOSABS 0.0 11/02/2017 0904   Iron/TIBC/Ferritin/ %Sat    Component Value Date/Time   IRON 30 11/02/2017 0904   TIBC 337 11/02/2017 0904   FERRITIN 28 11/02/2017 0904   IRONPCTSAT 9 (LL) 11/02/2017 0904   Lipid Panel     Component Value Date/Time   CHOL 178 04/23/2019 0920   TRIG 89 04/23/2019 0920   HDL 50 04/23/2019 0920   CHOLHDL 3 05/25/2017 0843   VLDL 20.6 05/25/2017 0843   LDLCALC 112 (H) 04/23/2019 0920   Hepatic  Function Panel     Component Value Date/Time   PROT 6.7 04/23/2019 0920   ALBUMIN 4.4 04/23/2019 0920   AST 11 04/23/2019 0920   ALT 18 04/23/2019 0920   ALKPHOS 65 04/23/2019 0920   BILITOT 0.3 04/23/2019 0920      Component Value Date/Time   TSH 1.41 11/30/2018 1210   TSH 2.700 11/02/2017 0904   TSH 1.97 05/25/2017 0843     Ref. Range 04/23/2019 09:20  Vitamin D, 25-Hydroxy Latest Ref Range: 30.0 - 100.0 ng/mL 49.2    I, Doreene Nest, am acting as Location manager for Abby Potash, PA-C I, Abby Potash, PA-C have reviewed above note and agree with its content

## 2019-06-14 ENCOUNTER — Ambulatory Visit: Payer: 59 | Admitting: Psychology

## 2019-06-26 ENCOUNTER — Encounter (INDEPENDENT_AMBULATORY_CARE_PROVIDER_SITE_OTHER): Payer: Self-pay | Admitting: Physician Assistant

## 2019-06-26 ENCOUNTER — Ambulatory Visit (INDEPENDENT_AMBULATORY_CARE_PROVIDER_SITE_OTHER): Payer: No Typology Code available for payment source | Admitting: Physician Assistant

## 2019-06-26 ENCOUNTER — Ambulatory Visit: Payer: No Typology Code available for payment source | Admitting: Sports Medicine

## 2019-06-26 ENCOUNTER — Other Ambulatory Visit: Payer: Self-pay

## 2019-06-26 VITALS — BP 132/86 | HR 71 | Temp 98.1°F | Ht 68.0 in | Wt 319.0 lb

## 2019-06-26 VITALS — BP 120/76 | Ht 69.0 in | Wt 317.0 lb

## 2019-06-26 DIAGNOSIS — M216X2 Other acquired deformities of left foot: Secondary | ICD-10-CM | POA: Diagnosis not present

## 2019-06-26 DIAGNOSIS — Z6841 Body Mass Index (BMI) 40.0 and over, adult: Secondary | ICD-10-CM | POA: Diagnosis not present

## 2019-06-26 DIAGNOSIS — M1990 Unspecified osteoarthritis, unspecified site: Secondary | ICD-10-CM

## 2019-06-26 DIAGNOSIS — R7303 Prediabetes: Secondary | ICD-10-CM

## 2019-06-26 DIAGNOSIS — M216X1 Other acquired deformities of right foot: Secondary | ICD-10-CM

## 2019-06-26 NOTE — Progress Notes (Signed)
Patient ID: [name], female   DOB: 01-20-1986, 34 y.o.   MRN: [id number]  [name] comes in today for custom orthotics.  Please see the office note from June 05, 2019 for details regarding history and physical exam findings.  In short, this patient has early left knee DJD and significant bilateral foot pronation.  Plan today is for custom orthotics.  Custom orthotics were created for her today.  She found them comfortable prior to leaving the office.  Evaluation of her gait after orthotic construction still shows some slight pronation bilaterally so I will likely need to add an additional scaphoid pad in about 4 weeks.  In the meantime, [name] will purchase some new tennis shoes as her current shoes are quite worn.  Patient was fitted for a : standard, cushioned, semi-rigid orthotic. The orthotic was heated and afterward the patient stood on the orthotic blank positioned on the orthotic stand. The patient was positioned in subtalar neutral position and 10 degrees of ankle dorsiflexion in a weight bearing stance. After completion of molding, a stable base was applied to the orthotic blank. The blank was ground to a stable position for weight bearing. Size: 9 Base: Blue EVA Posting: none Additional orthotic padding: none

## 2019-06-28 NOTE — Progress Notes (Signed)
Chief Complaint: OBESITY [name] is here to discuss her progress with her obesity treatment plan along with follow-up of her obesity related diagnoses. [name] is on the keeping a food journal of 1800 calories and 115 grams of protein daily plan and states she is following her eating plan approximately 60 to 70% of the time. [name] states she is doing cardio exercise 30 minutes 2 times per week.  Today's visit was #: 70 Starting weight: 363 lbs Starting date: 11/02/2017 Today's weight: 319 lbs Today's date: 06/26/2019 Total lbs lost to date: 44 Total lbs lost since last in-office visit: 0  Interim History: [name] states that due to a busy work schedule, she is sometimes missing a meal, or coming up about 300 calories short of her plan requirement. She also notes that she plans to increase exercise.  Subjective:   Prediabetes [name] is on metformin. She has no nausea, vomiting or diarrhea. She notes a decrease in appetite over the last week, after missing meals, due to her busy work schedule.  Osteoarthritis [name] had a recent left knee x-ray showing severe osteoarthritis, for which she got orthotics.  Assessment/Plan:   Prediabetes [name] will continue with medications and weight loss. She will continue to work on weight loss, exercise, and decreasing simple carbohydrates to help decrease the risk of diabetes.   Osteoarthritis [name] will continue with weight loss and exercise.  [name] is currently in the action stage of change. As such, her goal is to continue with weight loss efforts. She has agreed to keeping a food journal and adhering to recommended goals of 1800 calories and 115 grams of protein daily.   We discussed the following exercise goals today: For substantial health benefits, adults should do at least 150 minutes (2 hours and 30 minutes) a week of moderate-intensity, or 75 minutes (1 hour and 15 minutes) a week of vigorous-intensity aerobic physical  activity, or an equivalent combination of moderate- and vigorous-intensity aerobic activity. Aerobic activity should be performed in episodes of at least 10 minutes, and preferably, it should be spread throughout the week. Adults should also include muscle-strengthening activities that involve all major muscle groups on 2 or more days a week.  We discussed the following behavioral modification strategies today: no skipping meals and keeping a strict food journal.  [name] has agreed to follow-up with our clinic in 4 weeks. She was informed of the importance of frequent follow-up visits to maximize her success with intensive lifestyle modifications for her multiple health conditions.  Objective:   Blood pressure 132/86, pulse 71, temperature 98.1 F (36.7 C), temperature source Oral, height 5\' 8"  (1.727 m), weight (!) 319 lb (144.7 kg), SpO2 97 %. Body mass index is 48.5 kg/m.  General: Cooperative, alert, well developed, in no acute distress. HEENT: Conjunctivae and lids unremarkable. Neck: No thyromegaly.  Cardiovascular: Regular rhythm.  Lungs: Normal work of breathing. Extremities: No edema.  Neurologic: No focal deficits.   Lab Results  Component Value Date   CREATININE 0.69 04/23/2019   BUN 19 04/23/2019   NA 141 04/23/2019   K 4.4 04/23/2019   CL 104 04/23/2019   CO2 25 04/23/2019   Lab Results  Component Value Date   ALT 18 04/23/2019   AST 11 04/23/2019   ALKPHOS 65 04/23/2019   BILITOT 0.3 04/23/2019   Lab Results  Component Value Date   HGBA1C 5.2 04/23/2019   HGBA1C 5.5 11/30/2018   HGBA1C 5.1 07/17/2018   HGBA1C  5.3 02/16/2018   HGBA1C 5.5 11/02/2017   Lab Results  Component Value Date   INSULIN 14.6 04/23/2019   INSULIN 9.0 01/02/2019   INSULIN 15.0 07/17/2018   INSULIN 16.5 02/16/2018   INSULIN 35.6 (H) 11/02/2017   Lab Results  Component Value Date   TSH 1.41 11/30/2018   Lab Results  Component Value Date   CHOL 178 04/23/2019   HDL  50 04/23/2019   LDLCALC 112 (H) 04/23/2019   TRIG 89 04/23/2019   CHOLHDL 3 05/25/2017   Lab Results  Component Value Date   WBC 6.7 05/22/2019   HGB 13.0 05/22/2019   HCT 39.8 05/22/2019   MCV 86.4 05/22/2019   PLT 268.0 05/22/2019   Lab Results  Component Value Date   IRON 30 11/02/2017   TIBC 337 11/02/2017   FERRITIN 28 11/02/2017    Attestation Statements:   Reviewed by clinician on day of visit: allergies, medications, problem list, medical history, surgical history, family history, social history and previous encounter notes.  This visit occurred during the SARS-CoV-2 public health emergency. Safety protocols were in place, including screening questions prior to the visit, additional usage of staff PPE, and extensive cleaning of exam room while observing appropriate contact time as indicated for disinfecting solutions. (CPT [id number])  Time spent on visit including pre-visit chart review and post-visit care was 32 minutes.   Corey Skains, am acting as Location manager for [employer], PA-C.  I have reviewed the above documentation for accuracy and completeness, and I agree with the above. Abby Potash, PA-C

## 2019-07-06 ENCOUNTER — Ambulatory Visit (INDEPENDENT_AMBULATORY_CARE_PROVIDER_SITE_OTHER): Payer: No Typology Code available for payment source | Admitting: Psychology

## 2019-07-06 DIAGNOSIS — F411 Generalized anxiety disorder: Secondary | ICD-10-CM | POA: Diagnosis not present

## 2019-07-24 ENCOUNTER — Ambulatory Visit (INDEPENDENT_AMBULATORY_CARE_PROVIDER_SITE_OTHER): Payer: No Typology Code available for payment source | Admitting: Physician Assistant

## 2019-07-24 ENCOUNTER — Ambulatory Visit: Payer: No Typology Code available for payment source | Admitting: Sports Medicine

## 2019-07-24 ENCOUNTER — Other Ambulatory Visit: Payer: Self-pay

## 2019-07-24 ENCOUNTER — Encounter (INDEPENDENT_AMBULATORY_CARE_PROVIDER_SITE_OTHER): Payer: Self-pay | Admitting: Physician Assistant

## 2019-07-24 VITALS — BP 126/78 | Ht 69.0 in | Wt 317.0 lb

## 2019-07-24 VITALS — BP 123/78 | HR 73 | Temp 98.4°F | Ht 68.0 in | Wt 317.0 lb

## 2019-07-24 DIAGNOSIS — M216X2 Other acquired deformities of left foot: Secondary | ICD-10-CM

## 2019-07-24 DIAGNOSIS — M25562 Pain in left knee: Secondary | ICD-10-CM

## 2019-07-24 DIAGNOSIS — M216X1 Other acquired deformities of right foot: Secondary | ICD-10-CM

## 2019-07-24 DIAGNOSIS — Z6841 Body Mass Index (BMI) 40.0 and over, adult: Secondary | ICD-10-CM

## 2019-07-24 DIAGNOSIS — E559 Vitamin D deficiency, unspecified: Secondary | ICD-10-CM | POA: Diagnosis not present

## 2019-07-24 DIAGNOSIS — G8929 Other chronic pain: Secondary | ICD-10-CM

## 2019-07-24 NOTE — Progress Notes (Addendum)
Chief Complaint:   OBESITY [name] is here to discuss her progress with her obesity treatment plan along with follow-up of her obesity related diagnoses. [name] is on the Category 4 Plan and journaling and states she is following her eating plan approximately 80-85% of the time. [name] states she is doing cardio 30 minutes 2-3 times per week.  Today's visit was #: 54 Starting weight: 363 lbs Starting date: 11/02/2017 Today's weight: 317 lbs Today's date: 07/24/2019 Total lbs lost to date: 46 Total lbs lost since last in-office visit: 2  Interim History: [name] states that she is getting more calories and protein in than she was at her previous visit, but is looking for a way to improve her snacks.  Subjective:   Vitamin D deficiency. [name]'s most recent Vitamin D level was 49.2 on 04/23/2019. She is on OTC Vitamin D. No nausea, vomiting, or muscle weakness. She is not walking consistently.  Assessment/Plan:   Vitamin D deficiency. Low Vitamin D level contributes to fatigue and are associated with obesity, breast, and colon cancer. She agrees to continue to take OTC Vitamin D and will follow-up for routine testing of Vitamin D, at least 2-3 times per year to avoid over-replacement.  Class 3 severe obesity with serious comorbidity and body mass index (BMI) of 45.0 to 49.9 in adult, unspecified obesity type ([name]).  [name] is currently in the action stage of change. As such, her goal is to continue with weight loss efforts. She has agreed to keeping a food journal and adhering to recommended goals of 1800 calories and 115 grams of protein. She can add a protein shake as a snack.  Exercise goals: [name] was instructed to add resistance training 3 times per week.  Behavioral modification strategies: meal planning and cooking strategies and keeping healthy foods in the home.  [name] has agreed to follow-up with our clinic in 2 weeks. She was informed of the importance of  frequent follow-up visits to maximize her success with intensive lifestyle modifications for her multiple health conditions.   Objective:   Blood pressure 123/78, pulse 73, temperature 98.4 F (36.9 C), temperature source Oral, height 5\' 8"  (1.727 m), weight (!) 317 lb (143.8 kg), SpO2 99 %. Body mass index is 48.2 kg/m.  General: Cooperative, alert, well developed, in no acute distress. HEENT: Conjunctivae and lids unremarkable. Cardiovascular: Regular rhythm.  Lungs: Normal work of breathing. Neurologic: No focal deficits.   Lab Results  Component Value Date   CREATININE 0.69 04/23/2019   BUN 19 04/23/2019   NA 141 04/23/2019   K 4.4 04/23/2019   CL 104 04/23/2019   CO2 25 04/23/2019   Lab Results  Component Value Date   ALT 18 04/23/2019   AST 11 04/23/2019   ALKPHOS 65 04/23/2019   BILITOT 0.3 04/23/2019   Lab Results  Component Value Date   HGBA1C 5.2 04/23/2019   HGBA1C 5.5 11/30/2018   HGBA1C 5.1 07/17/2018   HGBA1C 5.3 02/16/2018   HGBA1C 5.5 11/02/2017   Lab Results  Component Value Date   INSULIN 14.6 04/23/2019   INSULIN 9.0 01/02/2019   INSULIN 15.0 07/17/2018   INSULIN 16.5 02/16/2018   INSULIN 35.6 (H) 11/02/2017   Lab Results  Component Value Date   TSH 1.41 11/30/2018   Lab Results  Component Value Date   CHOL 178 04/23/2019   HDL 50 04/23/2019   LDLCALC 112 (H) 04/23/2019   TRIG 89 04/23/2019   CHOLHDL 3 05/25/2017  Lab Results  Component Value Date   WBC 6.7 05/22/2019   HGB 13.0 05/22/2019   HCT 39.8 05/22/2019   MCV 86.4 05/22/2019   PLT 268.0 05/22/2019   Lab Results  Component Value Date   IRON 30 11/02/2017   TIBC 337 11/02/2017   FERRITIN 28 11/02/2017   Attestation Statements:   Reviewed by clinician on day of visit: allergies, medications, problem list, medical history, surgical history, family history, social history, and previous encounter notes.  Time spent on visit including pre-visit chart review and  post-visit care was 32 minutes.   IMichaelene Song, am acting as transcriptionist for Abby Potash, PA-C   I have reviewed the above documentation for accuracy and completeness, and I agree with the above. Abby Potash, PA-C

## 2019-07-24 NOTE — Progress Notes (Signed)
   Subjective:    Patient ID: [name], female    DOB: [birth year], 34 y.o.   MRN: [id number]  HPI   [name] comes in today for follow-up.  Her orthotics are comfortable.  She had to break them in over the course of a week or two but otherwise has done well.  She is still having left knee pain.  Status post remote left patellar dislocation.  She is in the process of losing weight with the help of a weight loss physician.  She is making good progress in regards to this.  She is also trying to become more active which is unfortunately aggravating her knee.    Review of Systems    As above Objective:   Physical Exam  Well-developed, well-nourished.  No acute distress  Left knee: Valgus thrust with walking.  Gait analysis: With her current orthotics in place, her right foot is neutral but her left foot still pronates starting at the heel.      Assessment & Plan:   Pronation Status post left knee patellar dislocation, remote injury  I added a medial heel wedge to her left shoe.  This did correct her pronation somewhat but was not a full correction.  I want to be careful and not add too much to her orthotic.  We briefly discussed patellar stabilizer braces but she has had difficulty in the past getting them to fit correctly.  With her current weight loss I think that that will continue to be an issue.  I congratulated her on her recent weight loss and increased activity.  I encouraged her to continue with both of these.  She will follow-up with me as needed.

## 2019-07-29 ENCOUNTER — Ambulatory Visit (INDEPENDENT_AMBULATORY_CARE_PROVIDER_SITE_OTHER): Payer: No Typology Code available for payment source | Admitting: Psychology

## 2019-07-29 DIAGNOSIS — F411 Generalized anxiety disorder: Secondary | ICD-10-CM | POA: Diagnosis not present

## 2019-08-07 ENCOUNTER — Ambulatory Visit (INDEPENDENT_AMBULATORY_CARE_PROVIDER_SITE_OTHER): Payer: No Typology Code available for payment source | Admitting: Psychology

## 2019-08-07 DIAGNOSIS — F411 Generalized anxiety disorder: Secondary | ICD-10-CM

## 2019-08-10 ENCOUNTER — Other Ambulatory Visit: Payer: 59

## 2019-08-14 ENCOUNTER — Encounter (INDEPENDENT_AMBULATORY_CARE_PROVIDER_SITE_OTHER): Payer: Self-pay | Admitting: Physician Assistant

## 2019-08-14 ENCOUNTER — Other Ambulatory Visit: Payer: Self-pay

## 2019-08-14 ENCOUNTER — Ambulatory Visit (INDEPENDENT_AMBULATORY_CARE_PROVIDER_SITE_OTHER): Payer: No Typology Code available for payment source | Admitting: Physician Assistant

## 2019-08-14 VITALS — BP 114/68 | HR 76 | Temp 98.4°F | Ht 68.0 in | Wt 320.0 lb

## 2019-08-14 DIAGNOSIS — Z6841 Body Mass Index (BMI) 40.0 and over, adult: Secondary | ICD-10-CM

## 2019-08-14 DIAGNOSIS — R7303 Prediabetes: Secondary | ICD-10-CM | POA: Diagnosis not present

## 2019-08-14 NOTE — Progress Notes (Signed)
Chief Complaint:   OBESITY [name] is here to discuss her progress with her obesity treatment plan along with follow-up of her obesity related diagnoses. [name] is on keeping a food journal and adhering to recommended goals of 1800 calories and 115 grams of protein daily and states she is following her eating plan approximately 60-70% of the time. [name] states she is doing cardio and strengthening for 30 minutes 6 times per week.  Today's visit was #: 4 Starting weight: 363 lbs Starting date: 11/02/17 Today's weight: 320 lbs Today's date: 08/14/2019 Total lbs lost to date: 43 Total lbs lost since last in-office visit: 0  Interim History: [name] reports that she struggled with the plan over Valentine's Day. She was able to get back on track the last week. She is working our regularly.  Subjective:   1. Pre-diabetes [name] is on metformin, and she denies nausea, vomiting, diarrhea, or polyphagia.  Assessment/Plan:   1. Pre-diabetes [name] agreed to continue taking metformin, and she will continue to work on weight loss, exercise, and decreasing simple carbohydrates to help decrease the risk of diabetes.   2. Class 3 severe obesity with serious comorbidity and body mass index (BMI) of 45.0 to 49.9 in adult, unspecified obesity type ([place]) [name] is currently in the action stage of change. As such, her goal is to continue with weight loss efforts. She has agreed to keeping a food journal and adhering to recommended goals of 1800 calories and 115 grams of protein daily.   Exercise goals: [name] is to continue her current exercise regimen as is.  Behavioral modification strategies: meal planning and cooking strategies and keeping healthy foods in the home.  [name] has agreed to follow-up with our clinic in 3 weeks. She was informed of the importance of frequent follow-up visits to maximize her success with intensive lifestyle modifications for her multiple health conditions.    Objective:   Blood pressure 114/68, pulse 76, temperature 98.4 F (36.9 C), temperature source Oral, height 5\' 8"  (1.727 m), weight (!) 320 lb (145.2 kg), SpO2 98 %. Body mass index is 48.66 kg/m.  General: Cooperative, alert, well developed, in no acute distress. HEENT: Conjunctivae and lids unremarkable. Cardiovascular: Regular rhythm.  Lungs: Normal work of breathing. Neurologic: No focal deficits.   Lab Results  Component Value Date   CREATININE 0.69 04/23/2019   BUN 19 04/23/2019   NA 141 04/23/2019   K 4.4 04/23/2019   CL 104 04/23/2019   CO2 25 04/23/2019   Lab Results  Component Value Date   ALT 18 04/23/2019   AST 11 04/23/2019   ALKPHOS 65 04/23/2019   BILITOT 0.3 04/23/2019   Lab Results  Component Value Date   HGBA1C 5.2 04/23/2019   HGBA1C 5.5 11/30/2018   HGBA1C 5.1 07/17/2018   HGBA1C 5.3 02/16/2018   HGBA1C 5.5 11/02/2017   Lab Results  Component Value Date   INSULIN 14.6 04/23/2019   INSULIN 9.0 01/02/2019   INSULIN 15.0 07/17/2018   INSULIN 16.5 02/16/2018   INSULIN 35.6 (H) 11/02/2017   Lab Results  Component Value Date   TSH 1.41 11/30/2018   Lab Results  Component Value Date   CHOL 178 04/23/2019   HDL 50 04/23/2019   LDLCALC 112 (H) 04/23/2019   TRIG 89 04/23/2019   CHOLHDL 3 05/25/2017   Lab Results  Component Value Date   WBC 6.7 05/22/2019   HGB 13.0 05/22/2019   HCT 39.8 05/22/2019   MCV 86.4 05/22/2019  PLT 268.0 05/22/2019   Lab Results  Component Value Date   IRON 30 11/02/2017   TIBC 337 11/02/2017   FERRITIN 28 11/02/2017   Attestation Statements:   Reviewed by clinician on day of visit: allergies, medications, problem list, medical history, surgical history, family history, social history, and previous encounter notes.  Time spent on visit including pre-visit chart review and post-visit care was 31 minutes.    Wilhemena Durie, am acting as transcriptionist for [employer], PA-C.  I have reviewed  the above documentation for accuracy and completeness, and I agree with the above. Abby Potash, PA-C

## 2019-08-29 ENCOUNTER — Ambulatory Visit (INDEPENDENT_AMBULATORY_CARE_PROVIDER_SITE_OTHER): Payer: No Typology Code available for payment source | Admitting: Family Medicine

## 2019-08-29 ENCOUNTER — Other Ambulatory Visit: Payer: Self-pay

## 2019-08-29 ENCOUNTER — Encounter: Payer: Self-pay | Admitting: Family Medicine

## 2019-08-29 VITALS — BP 124/77 | HR 76 | Temp 98.7°F | Resp 18 | Ht 68.0 in | Wt 325.5 lb

## 2019-08-29 DIAGNOSIS — L259 Unspecified contact dermatitis, unspecified cause: Secondary | ICD-10-CM | POA: Diagnosis not present

## 2019-08-29 MED ORDER — METHYLPREDNISOLONE ACETATE 80 MG/ML IJ SUSP
80.0000 mg | Freq: Once | INTRAMUSCULAR | Status: AC
  Administered 2019-08-29: 80 mg via INTRAMUSCULAR

## 2019-08-29 MED ORDER — PREDNISONE 20 MG PO TABS: ORAL_TABLET | ORAL | 0 refills | Status: DC

## 2019-08-29 NOTE — Progress Notes (Signed)
This visit occurred during the SARS-CoV-2 public health emergency.  Safety protocols were in place, including screening questions prior to the visit, additional usage of staff PPE, and extensive cleaning of exam room while observing appropriate contact time as indicated for disinfecting solutions.    [name] , 08/22/1985, 34 y.o., female MRN: [id number] Patient Care Team    Relationship Specialty Notifications Start End  Ma Hillock, DO PCP - General Family Medicine  04/25/15   Servando Salina, MD Consulting Physician Obstetrics and Gynecology  06/18/16   Wellington Hampshire, MD Consulting Physician Cardiology  06/18/16   Starlyn Skeans, MD Consulting Physician Family Medicine  05/26/18    Comment: Weight loss clinic    Chief Complaint  Patient presents with  . Rash    Pt has rash on nose since yesterday. Fluid filled bumps that are draining. Not itching.      Subjective: Pt presents for an OV with complaints of rash over the bridge of her nose. She denies pain or itching. She had been out in her backyard a few days ago. She has a rather significant case of plant dermatitis last year that spread rather quickly.   Depression screen [facility] 2/9 05/22/2019 11/30/2018 11/02/2017 05/25/2017 06/18/2016  Decreased Interest 0 0 3 0 0  Down, Depressed, Hopeless 0 0 2 0 0  PHQ - 2 Score 0 0 5 0 0  Altered sleeping - - 2 - -  Tired, decreased energy - - 3 - -  Change in appetite - - 3 - -  Feeling bad or failure about yourself  - - 3 - -  Trouble concentrating - - 1 - -  Moving slowly or fidgety/restless - - 1 - -  Suicidal thoughts - - 0 - -  PHQ-9 Score - - 18 - -  Difficult doing work/chores - - Somewhat difficult - -    No Known Allergies Social History   Social History Narrative   Ms [name] lives alone. 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   Past Medical History:  Diagnosis Date  . Anemia   . Back pain   . BPES syndrome   . Enlarged  thyroid   . Infertility, female   . Knee dislocation 2010   Left  . Obesity   . Palpitations   . PCOS (polycystic ovarian syndrome)   . PONV (postoperative nausea and vomiting)   . Prediabetes   . Premature ovarian failure   . Vitamin B 12 deficiency   . Vitamin D deficiency   . Wears eyeglasses    Past Surgical History:  Procedure Laterality Date  . EYE SURGERY Bilateral   . NASAL SEPTOPLASTY W/ TURBINOPLASTY Bilateral 03/04/2017   Procedure: NASAL SEPTOPLASTY WITH BILATERAL INFERIOR TURBINATE REDUCTION;  Surgeon: Jerrell Belfast, MD;  Location: [facility];  Service: ENT;  Laterality: Bilateral;  . NASAL SEPTUM SURGERY  03/04/2017  . TONSILLECTOMY AND ADENOIDECTOMY Bilateral 03/04/2017   Procedure: TONSILLECTOMY;  Surgeon: Jerrell Belfast, MD;  Location: [facility] OR;  Service: ENT;  Laterality: Bilateral;   Family History  Problem Relation Age of Onset  . Hyperlipidemia Father   . Hypertension Father   . Heart disease Father   . Sudden death Father        Age 44  . Other Father        BPES  . Heart attack Father   . Other Other  BPES  . Other Other        BPES  . Hyperlipidemia Mother   . Hypertension Mother   . Diabetes Mother   . Anxiety disorder Mother   . Obesity Mother   . Other Brother        BPES  . Other Sister        BPES  . Other Paternal Uncle        BPES  . Other Cousin        BPES  . Other Cousin        BPES  . Heart attack Maternal Grandmother   . Heart disease Maternal Grandfather   . Heart attack Maternal Grandfather   . Lung cancer Maternal Grandfather   . Heart disease Paternal Grandfather heartr attack  . Heart attack Paternal Grandfather 70       MI  . Sudden death Paternal Grandfather    Allergies as of 08/29/2019   No Known Allergies     Medication List       Accurate as of August 29, 2019  3:22 PM. If you have any questions, ask your nurse or doctor.        Cholecalciferol 125 MCG (5000 UT) capsule Take 1 capsule (5,000 Units  total) by mouth daily. With meal   medroxyPROGESTERone 5 MG tablet Commonly known as: PROVERA Take 1 tablet by mouth every 30 (thirty) days. Pt takes 12 tablets a month.   metFORMIN 500 MG 24 hr tablet Commonly known as: GLUCOPHAGE-XR Take 2 tablets (1,000 mg total) by mouth daily.   multivitamin capsule Take 1 capsule by mouth daily.   predniSONE 20 MG tablet Commonly known as: DELTASONE 60 mg x3d, 40 mg x3d, 20 mg x2d, 10 mg x2d Started by: Howard Pouch, DO   Premarin 0.625 MG tablet Generic drug: estrogens (conjugated)       All past medical history, surgical history, allergies, family history, immunizations andmedications were updated in the EMR today and reviewed under the history and medication portions of their EMR.     ROS: Negative, with the exception of above mentioned in HPI   Objective:  BP 124/77 (BP Location: Left Arm, Patient Position: Sitting, Cuff Size: Large)   Pulse 76   Temp 98.7 F (37.1 C) (Temporal)   Resp 18   Ht 5\' 8"  (1.727 m)   Wt (!) 325 lb 8 oz (147.6 kg)   LMP 08/15/2019 (Exact Date)   SpO2 98%   BMI 49.49 kg/m  Body mass index is 49.49 kg/m. Gen: Afebrile. No acute distress. Nontoxic in appearance, well developed, well nourished.  HENT: AT. [place].  Eyes:Pupils Equal Round Reactive to light, Extraocular movements intact,  Conjunctiva without redness, discharge or icterus. Skin: small vesicular rash over bridge of nose and glabellar region- sparing area under bridge of glasses.  No purpura or petechiae.    No exam data present No results found. No results found for this or any previous visit (from the past 24 hour(s)).  Assessment/Plan: [name] is a 34 y.o. female present for OV for  Contact dermatitis, unspecified contact dermatitis type, unspecified trigger Rash is consistent with a contact dermatitis - uncertain if plant related.  - may use OTC hydrocortisone bid for 7 days.  - IM depo medrol injection provided today.  -  prednisone taper prescribed for back up- since her prior plant dermatitis reaction was significant.  - f/u prn   Reviewed expectations re: course of current medical issues.  Discussed self-management of symptoms.  Outlined signs and symptoms indicating need for more acute intervention.  Patient verbalized understanding and all questions were answered.  Patient received an After-Visit Summary.    No orders of the defined types were placed in this encounter.  Meds ordered this encounter  Medications  . predniSONE (DELTASONE) 20 MG tablet    Sig: 60 mg x3d, 40 mg x3d, 20 mg x2d, 10 mg x2d    Dispense:  18 tablet    Refill:  0  . methylPREDNISolone acetate (DEPO-MEDROL) injection 80 mg   Referral Orders  No referral(s) requested today     Note is dictated utilizing voice recognition software. Although note has been proof read prior to signing, occasional typographical errors still can be missed. If any questions arise, please do not hesitate to call for verification.   electronically signed by:  Howard Pouch, DO  [place]    c

## 2019-08-29 NOTE — Patient Instructions (Signed)
Steroid shot provided today.  Prednisone 10 day taper prescribed (if needed). Use OTC Hydrocortisone cream twice a day for 7 days only to area.

## 2019-09-04 ENCOUNTER — Encounter (INDEPENDENT_AMBULATORY_CARE_PROVIDER_SITE_OTHER): Payer: Self-pay | Admitting: Physician Assistant

## 2019-09-04 ENCOUNTER — Other Ambulatory Visit: Payer: Self-pay

## 2019-09-04 ENCOUNTER — Ambulatory Visit (INDEPENDENT_AMBULATORY_CARE_PROVIDER_SITE_OTHER): Payer: No Typology Code available for payment source | Admitting: Physician Assistant

## 2019-09-04 VITALS — BP 124/88 | HR 68 | Temp 98.0°F | Ht 68.0 in | Wt 317.0 lb

## 2019-09-04 DIAGNOSIS — E559 Vitamin D deficiency, unspecified: Secondary | ICD-10-CM | POA: Diagnosis not present

## 2019-09-04 DIAGNOSIS — Z6841 Body Mass Index (BMI) 40.0 and over, adult: Secondary | ICD-10-CM

## 2019-09-04 NOTE — Progress Notes (Signed)
Chief Complaint:   OBESITY [name] is here to discuss her progress with her obesity treatment plan along with follow-up of her obesity related diagnoses. [name] is on the Category 4 Plan and journaling states she is following her eating plan approximately 80-90% of the time. [name] states she is doing strengthening 30 minutes 6 times per week.  Today's visit was #: 59 Starting weight: 363 lbs Starting date: 11/02/2017 Today's weight: 317 lbs Today's date: 09/04/2019 Total lbs lost to date: 46 Total lbs lost since last in-office visit: 3  Interim History: [name] reports that she was on prednisone for a few days, which increased her appetite. She has been able to stay on the plan very well.  Subjective:   Vitamin D deficiency. [name] is on OTC Vitamin D. No nausea, vomiting, or muscle weakness. Last Vitamin D 49.2 on 04/23/2019.  Assessment/Plan:   Vitamin D deficiency. Low Vitamin D level contributes to fatigue and are associated with obesity, breast, and colon cancer. She agrees to continue to take Vitamin D and will follow-up for routine testing of Vitamin D, at least 2-3 times per year to avoid over-replacement.  Class 3 severe obesity with serious comorbidity and body mass index (BMI) of 45.0 to 49.9 in adult, unspecified obesity type ([place]).  [name] is currently in the action stage of change. As such, her goal is to continue with weight loss efforts. She has agreed to keeping a food journal and adhering to recommended goals of 1800 calories and 115 grams of protein daily.   Exercise goals: For substantial health benefits, adults should do at least 150 minutes (2 hours and 30 minutes) a week of moderate-intensity, or 75 minutes (1 hour and 15 minutes) a week of vigorous-intensity aerobic physical activity, or an equivalent combination of moderate- and vigorous-intensity aerobic activity. Aerobic activity should be performed in episodes of at least 10 minutes, and  preferably, it should be spread throughout the week.  Behavioral modification strategies: meal planning and cooking strategies and celebration eating strategies.  [name] has agreed to follow-up with our clinic in 3 weeks. She was informed of the importance of frequent follow-up visits to maximize her success with intensive lifestyle modifications for her multiple health conditions.   Objective:   Blood pressure 124/88, pulse 68, temperature 98 F (36.7 C), height 5\' 8"  (1.727 m), weight (!) 317 lb (143.8 kg), last menstrual period 08/15/2019, SpO2 99 %. Body mass index is 48.2 kg/m.  General: Cooperative, alert, well developed, in no acute distress. HEENT: Conjunctivae and lids unremarkable. Cardiovascular: Regular rhythm.  Lungs: Normal work of breathing. Neurologic: No focal deficits.   Lab Results  Component Value Date   CREATININE 0.69 04/23/2019   BUN 19 04/23/2019   NA 141 04/23/2019   K 4.4 04/23/2019   CL 104 04/23/2019   CO2 25 04/23/2019   Lab Results  Component Value Date   ALT 18 04/23/2019   AST 11 04/23/2019   ALKPHOS 65 04/23/2019   BILITOT 0.3 04/23/2019   Lab Results  Component Value Date   HGBA1C 5.2 04/23/2019   HGBA1C 5.5 11/30/2018   HGBA1C 5.1 07/17/2018   HGBA1C 5.3 02/16/2018   HGBA1C 5.5 11/02/2017   Lab Results  Component Value Date   INSULIN 14.6 04/23/2019   INSULIN 9.0 01/02/2019   INSULIN 15.0 07/17/2018   INSULIN 16.5 02/16/2018   INSULIN 35.6 (H) 11/02/2017   Lab Results  Component Value Date   TSH 1.41 11/30/2018  Lab Results  Component Value Date   CHOL 178 04/23/2019   HDL 50 04/23/2019   LDLCALC 112 (H) 04/23/2019   TRIG 89 04/23/2019   CHOLHDL 3 05/25/2017   Lab Results  Component Value Date   WBC 6.7 05/22/2019   HGB 13.0 05/22/2019   HCT 39.8 05/22/2019   MCV 86.4 05/22/2019   PLT 268.0 05/22/2019   Lab Results  Component Value Date   IRON 30 11/02/2017   TIBC 337 11/02/2017   FERRITIN 28 11/02/2017    Attestation Statements:   Reviewed by clinician on day of visit: allergies, medications, problem list, medical history, surgical history, family history, social history, and previous encounter notes.  Time spent on visit including pre-visit chart review and post-visit charting and care was 31 minutes.   IMichaelene Song, am acting as transcriptionist for Abby Potash, PA-C   I have reviewed the above documentation for accuracy and completeness, and I agree with the above. Abby Potash, PA-C

## 2019-09-05 ENCOUNTER — Encounter: Payer: Self-pay | Admitting: Family Medicine

## 2019-09-05 ENCOUNTER — Ambulatory Visit
Admission: RE | Admit: 2019-09-05 | Discharge: 2019-09-05 | Disposition: A | Discharge status: Home / Self Care | Payer: No Typology Code available for payment source | Source: Ambulatory Visit | Attending: Family Medicine | Admitting: Family Medicine

## 2019-09-05 DIAGNOSIS — E559 Vitamin D deficiency, unspecified: Secondary | ICD-10-CM

## 2019-09-05 DIAGNOSIS — Q103 Other congenital malformations of eyelid: Secondary | ICD-10-CM

## 2019-09-13 ENCOUNTER — Ambulatory Visit: Payer: No Typology Code available for payment source | Admitting: Psychology

## 2019-09-20 ENCOUNTER — Ambulatory Visit: Payer: No Typology Code available for payment source | Admitting: Psychology

## 2019-09-28 ENCOUNTER — Encounter (INDEPENDENT_AMBULATORY_CARE_PROVIDER_SITE_OTHER): Payer: Self-pay | Admitting: Physician Assistant

## 2019-09-28 ENCOUNTER — Other Ambulatory Visit (HOSPITAL_COMMUNITY): Payer: Self-pay | Admitting: Obstetrics and Gynecology

## 2019-10-01 ENCOUNTER — Ambulatory Visit (INDEPENDENT_AMBULATORY_CARE_PROVIDER_SITE_OTHER): Payer: No Typology Code available for payment source | Admitting: Physician Assistant

## 2019-10-17 ENCOUNTER — Other Ambulatory Visit: Payer: Self-pay

## 2019-10-17 ENCOUNTER — Ambulatory Visit (INDEPENDENT_AMBULATORY_CARE_PROVIDER_SITE_OTHER): Payer: No Typology Code available for payment source | Admitting: Physician Assistant

## 2019-10-17 ENCOUNTER — Encounter (INDEPENDENT_AMBULATORY_CARE_PROVIDER_SITE_OTHER): Payer: Self-pay | Admitting: Physician Assistant

## 2019-10-17 VITALS — BP 120/71 | HR 67 | Temp 97.9°F | Ht 68.0 in | Wt 315.0 lb

## 2019-10-17 DIAGNOSIS — Z6841 Body Mass Index (BMI) 40.0 and over, adult: Secondary | ICD-10-CM | POA: Diagnosis not present

## 2019-10-17 DIAGNOSIS — R7303 Prediabetes: Secondary | ICD-10-CM

## 2019-10-17 NOTE — Progress Notes (Signed)
Chief Complaint:   OBESITY [name] is here to discuss her progress with her obesity treatment plan along with follow-up of her obesity related diagnoses. [name] is on keeping a food journal and adhering to recommended goals of 1800 calories and 115 grams of protein and states she is following her eating plan approximately 80-90% of the time. [name] states she is doing cardio and strength training for 30 minutes 3 times per week.  Today's visit was #: 13 Starting weight: 363 lbs Starting date: 11/02/2017 Today's weight: 315 lbs Today's date: 10/17/2019 Total lbs lost to date: 48 lbs Total lbs lost since last in-office visit: 2 lbs  Interim History: [name] did really well with weight loss.  She hasn't been working out as much the past few weeks.  She is getting slightly bored with breakfast.  Subjective:   1. Prediabetes [name] has a diagnosis of prediabetes based on her elevated HgA1c and was informed this puts her at greater risk of developing diabetes. She continues to work on diet and exercise to decrease her risk of diabetes.  She is taking metformin 1000 mg daily.  She denies hypoglycemia.  No nausea, vomiting, diarrhea, or polyphagia.  Lab Results  Component Value Date   HGBA1C 5.2 04/23/2019   Lab Results  Component Value Date   INSULIN 14.6 04/23/2019   INSULIN 9.0 01/02/2019   INSULIN 15.0 07/17/2018   INSULIN 16.5 02/16/2018   INSULIN 35.6 (H) 11/02/2017   Assessment/Plan:   1. Prediabetes [name] will continue to work on weight loss, exercise, and decreasing simple carbohydrates to help decrease the risk of diabetes.  She will continue metformin and weight loss.  2. Class 3 severe obesity with serious comorbidity and body mass index (BMI) of 45.0 to 49.9 in adult, unspecified obesity type ([facility]) [name] is currently in the action stage of change. As such, her goal is to continue with weight loss efforts. She has agreed to keeping a food journal and adhering to  recommended goals of 1800-1900 calories and 115 grams of protein.   Exercise goals: As is.  Behavioral modification strategies: meal planning and cooking strategies and keeping healthy foods in the home.  [name] has agreed to follow-up with our clinic in 2 weeks. She was informed of the importance of frequent follow-up visits to maximize her success with intensive lifestyle modifications for her multiple health conditions.   Objective:   Blood pressure 120/71, pulse 67, temperature 97.9 F (36.6 C), temperature source Oral, height 5\' 8"  (1.727 m), weight (!) 315 lb (142.9 kg), SpO2 99 %. Body mass index is 47.9 kg/m.  General: Cooperative, alert, well developed, in no acute distress. HEENT: Conjunctivae and lids unremarkable. Cardiovascular: Regular rhythm.  Lungs: Normal work of breathing. Neurologic: No focal deficits.   Lab Results  Component Value Date   CREATININE 0.69 04/23/2019   BUN 19 04/23/2019   NA 141 04/23/2019   K 4.4 04/23/2019   CL 104 04/23/2019   CO2 25 04/23/2019   Lab Results  Component Value Date   ALT 18 04/23/2019   AST 11 04/23/2019   ALKPHOS 65 04/23/2019   BILITOT 0.3 04/23/2019   Lab Results  Component Value Date   HGBA1C 5.2 04/23/2019   HGBA1C 5.5 11/30/2018   HGBA1C 5.1 07/17/2018   HGBA1C 5.3 02/16/2018   HGBA1C 5.5 11/02/2017   Lab Results  Component Value Date   INSULIN 14.6 04/23/2019   INSULIN 9.0 01/02/2019   INSULIN 15.0 07/17/2018   INSULIN  16.5 02/16/2018   INSULIN 35.6 (H) 11/02/2017   Lab Results  Component Value Date   TSH 1.41 11/30/2018   Lab Results  Component Value Date   CHOL 178 04/23/2019   HDL 50 04/23/2019   LDLCALC 112 (H) 04/23/2019   TRIG 89 04/23/2019   CHOLHDL 3 05/25/2017   Lab Results  Component Value Date   WBC 6.7 05/22/2019   HGB 13.0 05/22/2019   HCT 39.8 05/22/2019   MCV 86.4 05/22/2019   PLT 268.0 05/22/2019   Lab Results  Component Value Date   IRON 30 11/02/2017   TIBC 337  11/02/2017   FERRITIN 28 11/02/2017   Attestation Statements:   Reviewed by clinician on day of visit: allergies, medications, problem list, medical history, surgical history, family history, social history, and previous encounter notes.  Time spent on visit including pre-visit chart review and post-visit charting and care was 32 minutes.   I, Water quality scientist, CMA, am acting as Location manager for [employer], PA-C.  I have reviewed the above documentation for accuracy and completeness, and I agree with the above. Abby Potash, PA-C

## 2019-10-18 DIAGNOSIS — F411 Generalized anxiety disorder: Secondary | ICD-10-CM | POA: Diagnosis not present

## 2019-10-19 ENCOUNTER — Ambulatory Visit (INDEPENDENT_AMBULATORY_CARE_PROVIDER_SITE_OTHER): Payer: No Typology Code available for payment source | Admitting: Psychology

## 2019-11-01 ENCOUNTER — Encounter (INDEPENDENT_AMBULATORY_CARE_PROVIDER_SITE_OTHER): Payer: Self-pay | Admitting: Physician Assistant

## 2019-11-01 ENCOUNTER — Other Ambulatory Visit: Payer: Self-pay

## 2019-11-01 ENCOUNTER — Ambulatory Visit (INDEPENDENT_AMBULATORY_CARE_PROVIDER_SITE_OTHER): Payer: No Typology Code available for payment source | Admitting: Physician Assistant

## 2019-11-01 VITALS — BP 124/81 | HR 66 | Temp 98.3°F | Ht 68.0 in | Wt 318.0 lb

## 2019-11-01 DIAGNOSIS — E559 Vitamin D deficiency, unspecified: Secondary | ICD-10-CM

## 2019-11-01 DIAGNOSIS — Z6841 Body Mass Index (BMI) 40.0 and over, adult: Secondary | ICD-10-CM | POA: Diagnosis not present

## 2019-11-01 NOTE — Progress Notes (Signed)
Chief Complaint:   OBESITY [name] is here to discuss her progress with her obesity treatment plan along with follow-up of her obesity related diagnoses. [name] is keeping a food journal and adhering to recommended goals of 1800-1900 calories and 115 grams of protein and states she is following her eating plan approximately 90% of the time. [name] states she is doing cardio/strengthening 30 minutes 7 times per week.  Today's visit was #: 28 Starting weight: 363 lbs Starting date: 11/02/2017 Today's weight: 318 lbs Today's date: 11/01/2019 Total lbs lost to date: 45 Total lbs lost since last in-office visit: 0  Interim History: [name] reports the first week she did very well and then got off track at a birthday party this past weekend. Her hunger is controlled. She reports that she does not have obstructive sleep apnea.  Subjective:   Vitamin D deficiency. [name] is on Vitamin D. No nausea, vomiting, or muscle weakness. Last Vitamin D 49.2 on 04/23/2019.  Assessment/Plan:   Vitamin D deficiency. Low Vitamin D level contributes to fatigue and are associated with obesity, breast, and colon cancer. She agrees to continue to take Vitamin D and will follow-up for routine testing of Vitamin D, at least 2-3 times per year to avoid over-replacement.  Class 3 severe obesity with serious comorbidity and body mass index (BMI) of 45.0 to 49.9 in adult, unspecified obesity type ([place]).  [name] is currently in the action stage of change. As such, her goal is to continue with weight loss efforts. She has agreed to keeping a food journal and adhering to recommended goals of 1800-1900 calories and 115 grams of protein daily.   Exercise goals: For substantial health benefits, adults should do at least 150 minutes (2 hours and 30 minutes) a week of moderate-intensity, or 75 minutes (1 hour and 15 minutes) a week of vigorous-intensity aerobic physical activity, or an equivalent combination  of moderate- and vigorous-intensity aerobic activity. Aerobic activity should be performed in episodes of at least 10 minutes, and preferably, it should be spread throughout the week.  Behavioral modification strategies: meal planning and cooking strategies and planning for success.  [name] has agreed to follow-up with our clinic in 2 weeks. She was informed of the importance of frequent follow-up visits to maximize her success with intensive lifestyle modifications for her multiple health conditions.   Objective:   Blood pressure 124/81, pulse 66, temperature 98.3 F (36.8 C), height 5\' 8"  (1.727 m), weight (!) 318 lb (144.2 kg), last menstrual period 10/15/2019, SpO2 99 %. Body mass index is 48.35 kg/m.  General: Cooperative, alert, well developed, in no acute distress. HEENT: Conjunctivae and lids unremarkable. Cardiovascular: Regular rhythm.  Lungs: Normal work of breathing. Neurologic: No focal deficits.   Lab Results  Component Value Date   CREATININE 0.69 04/23/2019   BUN 19 04/23/2019   NA 141 04/23/2019   K 4.4 04/23/2019   CL 104 04/23/2019   CO2 25 04/23/2019   Lab Results  Component Value Date   ALT 18 04/23/2019   AST 11 04/23/2019   ALKPHOS 65 04/23/2019   BILITOT 0.3 04/23/2019   Lab Results  Component Value Date   HGBA1C 5.2 04/23/2019   HGBA1C 5.5 11/30/2018   HGBA1C 5.1 07/17/2018   HGBA1C 5.3 02/16/2018   HGBA1C 5.5 11/02/2017   Lab Results  Component Value Date   INSULIN 14.6 04/23/2019   INSULIN 9.0 01/02/2019   INSULIN 15.0 07/17/2018   INSULIN 16.5 02/16/2018  INSULIN 35.6 (H) 11/02/2017   Lab Results  Component Value Date   TSH 1.41 11/30/2018   Lab Results  Component Value Date   CHOL 178 04/23/2019   HDL 50 04/23/2019   LDLCALC 112 (H) 04/23/2019   TRIG 89 04/23/2019   CHOLHDL 3 05/25/2017   Lab Results  Component Value Date   WBC 6.7 05/22/2019   HGB 13.0 05/22/2019   HCT 39.8 05/22/2019   MCV 86.4 05/22/2019   PLT  268.0 05/22/2019   Lab Results  Component Value Date   IRON 30 11/02/2017   TIBC 337 11/02/2017   FERRITIN 28 11/02/2017   Attestation Statements:   Reviewed by clinician on day of visit: allergies, medications, problem list, medical history, surgical history, family history, social history, and previous encounter notes.  Time spent on visit including pre-visit chart review and post-visit charting and care was 32 minutes.   IMichaelene Song, am acting as transcriptionist for Abby Potash, PA-C   I have reviewed the above documentation for accuracy and completeness, and I agree with the above. Abby Potash, PA-C

## 2019-11-06 ENCOUNTER — Ambulatory Visit (INDEPENDENT_AMBULATORY_CARE_PROVIDER_SITE_OTHER): Payer: No Typology Code available for payment source | Admitting: Physician Assistant

## 2019-11-21 ENCOUNTER — Other Ambulatory Visit: Payer: Self-pay

## 2019-11-21 ENCOUNTER — Encounter (INDEPENDENT_AMBULATORY_CARE_PROVIDER_SITE_OTHER): Payer: Self-pay | Admitting: Physician Assistant

## 2019-11-21 ENCOUNTER — Ambulatory Visit: Payer: No Typology Code available for payment source | Admitting: Psychology

## 2019-11-21 ENCOUNTER — Ambulatory Visit (INDEPENDENT_AMBULATORY_CARE_PROVIDER_SITE_OTHER): Payer: No Typology Code available for payment source | Admitting: Physician Assistant

## 2019-11-21 VITALS — BP 121/79 | HR 61 | Temp 98.4°F | Ht 68.0 in | Wt 313.0 lb

## 2019-11-21 DIAGNOSIS — Z6841 Body Mass Index (BMI) 40.0 and over, adult: Secondary | ICD-10-CM | POA: Diagnosis not present

## 2019-11-21 DIAGNOSIS — E7849 Other hyperlipidemia: Secondary | ICD-10-CM | POA: Diagnosis not present

## 2019-11-21 NOTE — Progress Notes (Signed)
Chief Complaint:   OBESITY [name] is here to discuss her progress with her obesity treatment plan along with follow-up of her obesity related diagnoses. [name] is on the Category 4 Plan and journaling and states she is following her eating plan approximately 90% of the time. [name] states she is doing cardio/strength training/yoga 30 minutes 7 times per week.  Today's visit was #: 59 Starting weight: 363 lbs Starting date: 11/02/2017 Today's weight: 313 lbs Today's date: 11/21/2019 Total lbs lost to date: 50 Total lbs lost since last in-office visit: 5  Interim History: [name] did very well with weight loss. She is journaling daily and doing a great job meeting her protein and calorie goals.  Subjective:   Other hyperlipidemia. [name] has hyperlipidemia and has been trying to improve her cholesterol levels with intensive lifestyle modification including a low saturated fat diet, exercise and weight loss. She denies any chest pain. [name] is on no medication. She is exercising regularly.  Lab Results  Component Value Date   ALT 18 04/23/2019   AST 11 04/23/2019   ALKPHOS 65 04/23/2019   BILITOT 0.3 04/23/2019   Lab Results  Component Value Date   CHOL 178 04/23/2019   HDL 50 04/23/2019   LDLCALC 112 (H) 04/23/2019   TRIG 89 04/23/2019   CHOLHDL 3 05/25/2017   Assessment/Plan:   Other hyperlipidemia. Cardiovascular risk and specific lipid/LDL goals reviewed.  We discussed several lifestyle modifications today and [name] will continue to work on diet, exercise and weight loss efforts. Orders and follow up as documented in patient record.   Counseling Intensive lifestyle modifications are the first line treatment for this issue. . Dietary changes: Increase soluble fiber. Decrease simple carbohydrates. . Exercise changes: Moderate to vigorous-intensity aerobic activity 150 minutes per week if tolerated. . Lipid-lowering medications: see documented in medical  record.  Class 3 severe obesity with serious comorbidity and body mass index (BMI) of 45.0 to 49.9 in adult, unspecified obesity type ([place]).  [name] is currently in the action stage of change. As such, her goal is to continue with weight loss efforts. She has agreed to keeping a food journal and adhering to recommended goals of 1800-1900 calories and 115 grams of protein.   Exercise goals: For substantial health benefits, adults should do at least 150 minutes (2 hours and 30 minutes) a week of moderate-intensity, or 75 minutes (1 hour and 15 minutes) a week of vigorous-intensity aerobic physical activity, or an equivalent combination of moderate- and vigorous-intensity aerobic activity. Aerobic activity should be performed in episodes of at least 10 minutes, and preferably, it should be spread throughout the week.  Behavioral modification strategies: meal planning and cooking strategies and keeping healthy foods in the home.  [name] has agreed to follow-up with our clinic in 3 weeks. She was informed of the importance of frequent follow-up visits to maximize her success with intensive lifestyle modifications for her multiple health conditions.   Objective:   Blood pressure 121/79, pulse 61, temperature 98.4 F (36.9 C), height 5\' 8"  (1.727 m), weight (!) 313 lb (142 kg), SpO2 98 %. Body mass index is 47.59 kg/m.  General: Cooperative, alert, well developed, in no acute distress. HEENT: Conjunctivae and lids unremarkable. Cardiovascular: Regular rhythm.  Lungs: Normal work of breathing. Neurologic: No focal deficits.   Lab Results  Component Value Date   CREATININE 0.69 04/23/2019   BUN 19 04/23/2019   NA 141 04/23/2019   K 4.4 04/23/2019   CL  104 04/23/2019   CO2 25 04/23/2019   Lab Results  Component Value Date   ALT 18 04/23/2019   AST 11 04/23/2019   ALKPHOS 65 04/23/2019   BILITOT 0.3 04/23/2019   Lab Results  Component Value Date   HGBA1C 5.2 04/23/2019   HGBA1C 5.5  11/30/2018   HGBA1C 5.1 07/17/2018   HGBA1C 5.3 02/16/2018   HGBA1C 5.5 11/02/2017   Lab Results  Component Value Date   INSULIN 14.6 04/23/2019   INSULIN 9.0 01/02/2019   INSULIN 15.0 07/17/2018   INSULIN 16.5 02/16/2018   INSULIN 35.6 (H) 11/02/2017   Lab Results  Component Value Date   TSH 1.41 11/30/2018   Lab Results  Component Value Date   CHOL 178 04/23/2019   HDL 50 04/23/2019   LDLCALC 112 (H) 04/23/2019   TRIG 89 04/23/2019   CHOLHDL 3 05/25/2017   Lab Results  Component Value Date   WBC 6.7 05/22/2019   HGB 13.0 05/22/2019   HCT 39.8 05/22/2019   MCV 86.4 05/22/2019   PLT 268.0 05/22/2019   Lab Results  Component Value Date   IRON 30 11/02/2017   TIBC 337 11/02/2017   FERRITIN 28 11/02/2017   Attestation Statements:   Reviewed by clinician on day of visit: allergies, medications, problem list, medical history, surgical history, family history, social history, and previous encounter notes.  Time spent on visit including pre-visit chart review and post-visit charting and care was 30 minutes.   IMichaelene Song, am acting as transcriptionist for Abby Potash, PA-C   I have reviewed the above documentation for accuracy and completeness, and I agree with the above. Abby Potash, PA-C

## 2019-11-27 ENCOUNTER — Ambulatory Visit (INDEPENDENT_AMBULATORY_CARE_PROVIDER_SITE_OTHER): Payer: No Typology Code available for payment source | Admitting: Psychology

## 2019-11-27 DIAGNOSIS — F411 Generalized anxiety disorder: Secondary | ICD-10-CM | POA: Diagnosis not present

## 2019-12-11 ENCOUNTER — Ambulatory Visit (INDEPENDENT_AMBULATORY_CARE_PROVIDER_SITE_OTHER): Payer: No Typology Code available for payment source | Admitting: Physician Assistant

## 2019-12-11 ENCOUNTER — Encounter (INDEPENDENT_AMBULATORY_CARE_PROVIDER_SITE_OTHER): Payer: Self-pay | Admitting: Physician Assistant

## 2019-12-11 ENCOUNTER — Other Ambulatory Visit: Payer: Self-pay

## 2019-12-11 VITALS — BP 114/73 | HR 59 | Temp 98.1°F | Ht 68.0 in | Wt 311.0 lb

## 2019-12-11 DIAGNOSIS — R7303 Prediabetes: Secondary | ICD-10-CM

## 2019-12-11 DIAGNOSIS — Z6841 Body Mass Index (BMI) 40.0 and over, adult: Secondary | ICD-10-CM | POA: Diagnosis not present

## 2019-12-12 NOTE — Progress Notes (Signed)
Chief Complaint:   OBESITY [name] is here to discuss her progress with her obesity treatment plan along with follow-up of her obesity related diagnoses. [name] is on the Category 4 Plan and keeping a food journal and adhering to recommended goals of 1800-1900 calories and 115 grams of protein and states she is following her eating plan approximately 80% of the time. [name] states she is doing cardio or strength training for 30 minutes 2-3 times per week.  Today's visit was #: 85 Starting weight: 363 lbs Starting date: 11/02/2017 Today's weight: 311 lbs Today's date: 12/11/2019 Total lbs lost to date: 52 lbs Total lbs lost since last in-office visit: 2 lbs  Interim History: [name] states that she may have gone over her calories on a few days.  Overall, she continues to do very well with weight loss.  She is getting bored with her workouts.  Subjective:   1. Prediabetes [name] has a diagnosis of prediabetes based on her elevated HgA1c and was informed this puts her at greater risk of developing diabetes. She continues to work on diet and exercise to decrease her risk of diabetes. She denies hypoglycemia.  She is taking metformin 1000 mg daily.  No nausea, vomiting, or diarrhea.   Lab Results  Component Value Date   HGBA1C 5.2 04/23/2019   Lab Results  Component Value Date   INSULIN 14.6 04/23/2019   INSULIN 9.0 01/02/2019   INSULIN 15.0 07/17/2018   INSULIN 16.5 02/16/2018   INSULIN 35.6 (H) 11/02/2017   Assessment/Plan:   1. Prediabetes [name] will continue to work on weight loss, exercise, and decreasing simple carbohydrates to help decrease the risk of diabetes.    2. Class 3 severe obesity with serious comorbidity and body mass index (BMI) of 45.0 to 49.9 in adult, unspecified obesity type ([place]) [name] is currently in the action stage of change. As such, her goal is to continue with weight loss efforts. She has agreed to keeping a food journal and adhering to  recommended goals of 1800 calories and 115 grams of protein.   Exercise goals: As is.  Behavioral modification strategies: meal planning and cooking strategies and keeping healthy foods in the home.  [name] has agreed to follow-up with our clinic in 2 weeks. She was informed of the importance of frequent follow-up visits to maximize her success with intensive lifestyle modifications for her multiple health conditions.   Objective:   Blood pressure 114/73, pulse (!) 59, temperature 98.1 F (36.7 C), height 5\' 8"  (1.727 m), weight (!) 311 lb (141.1 kg), last menstrual period 11/13/2019, SpO2 100 %. Body mass index is 47.29 kg/m.  General: Cooperative, alert, well developed, in no acute distress. HEENT: Conjunctivae and lids unremarkable. Cardiovascular: Regular rhythm.  Lungs: Normal work of breathing. Neurologic: No focal deficits.   Lab Results  Component Value Date   CREATININE 0.69 04/23/2019   BUN 19 04/23/2019   NA 141 04/23/2019   K 4.4 04/23/2019   CL 104 04/23/2019   CO2 25 04/23/2019   Lab Results  Component Value Date   ALT 18 04/23/2019   AST 11 04/23/2019   ALKPHOS 65 04/23/2019   BILITOT 0.3 04/23/2019   Lab Results  Component Value Date   HGBA1C 5.2 04/23/2019   HGBA1C 5.5 11/30/2018   HGBA1C 5.1 07/17/2018   HGBA1C 5.3 02/16/2018   HGBA1C 5.5 11/02/2017   Lab Results  Component Value Date   INSULIN 14.6 04/23/2019   INSULIN 9.0 01/02/2019  INSULIN 15.0 07/17/2018   INSULIN 16.5 02/16/2018   INSULIN 35.6 (H) 11/02/2017   Lab Results  Component Value Date   TSH 1.41 11/30/2018   Lab Results  Component Value Date   CHOL 178 04/23/2019   HDL 50 04/23/2019   LDLCALC 112 (H) 04/23/2019   TRIG 89 04/23/2019   CHOLHDL 3 05/25/2017   Lab Results  Component Value Date   WBC 6.7 05/22/2019   HGB 13.0 05/22/2019   HCT 39.8 05/22/2019   MCV 86.4 05/22/2019   PLT 268.0 05/22/2019   Lab Results  Component Value Date   IRON 30 11/02/2017    TIBC 337 11/02/2017   FERRITIN 28 11/02/2017   Attestation Statements:   Reviewed by clinician on day of visit: allergies, medications, problem list, medical history, surgical history, family history, social history, and previous encounter notes.  Time spent on visit including pre-visit chart review and post-visit care and charting was 33 minutes.   I, Water quality scientist, CMA, am acting as transcriptionist for Abby Potash, PA-C  I have reviewed the above documentation for accuracy and completeness, and I agree with the above. Abby Potash, PA-C

## 2019-12-26 ENCOUNTER — Other Ambulatory Visit: Payer: Self-pay

## 2019-12-26 ENCOUNTER — Encounter (INDEPENDENT_AMBULATORY_CARE_PROVIDER_SITE_OTHER): Payer: Self-pay | Admitting: Physician Assistant

## 2019-12-26 ENCOUNTER — Ambulatory Visit (INDEPENDENT_AMBULATORY_CARE_PROVIDER_SITE_OTHER): Payer: No Typology Code available for payment source | Admitting: Physician Assistant

## 2019-12-26 VITALS — BP 126/71 | HR 64 | Temp 98.0°F | Ht 68.0 in | Wt 312.0 lb

## 2019-12-26 DIAGNOSIS — E559 Vitamin D deficiency, unspecified: Secondary | ICD-10-CM

## 2019-12-26 DIAGNOSIS — Z6841 Body Mass Index (BMI) 40.0 and over, adult: Secondary | ICD-10-CM

## 2019-12-26 NOTE — Progress Notes (Signed)
Chief Complaint:   OBESITY [name] is here to discuss her progress with her obesity treatment plan along with follow-up of her obesity related diagnoses. [name] is keeping a food journal and adhering to recommended goals of 1800 calories and 115 grams of protein and states she is following her eating plan approximately 75% of the time. [name] states she is doing cardio 30 minutes 1-2 times per week.  Today's visit was #: 83 Starting weight: 363 lbs Starting date: 11/02/2017 Today's weight: 312 lbs Today's date: 12/26/2019 Total lbs lost to date: 51 lbs Total lbs lost since last in-office visit: 0  Interim History: [name] states that the first week she didn't journal consistently. She has been back on track, but reports being hungrier throughout the day on some days.  Subjective:   Vitamin D deficiency. [name] is on Vitamin D daily.   Ref. Range 04/23/2019 09:20  Vitamin D, 25-Hydroxy Latest Ref Range: 30.0 - 100.0 ng/mL 49.2   Assessment/Plan:   Vitamin D deficiency. Low Vitamin D level contributes to fatigue and are associated with obesity, breast, and colon cancer. She agrees to continue to take Vitamin D and will follow-up for routine testing of Vitamin D, at least 2-3 times per year to avoid over-replacement.  Class 3 severe obesity with serious comorbidity and body mass index (BMI) of 45.0 to 49.9 in adult, unspecified obesity type ([place]).  [name] is currently in the action stage of change. As such, her goal is to continue with weight loss efforts. She has agreed to keeping a food journal and adhering to recommended goals of 1800 calories and 115 grams of protein.   Exercise goals: For substantial health benefits, adults should do at least 150 minutes (2 hours and 30 minutes) a week of moderate-intensity, or 75 minutes (1 hour and 15 minutes) a week of vigorous-intensity aerobic physical activity, or an equivalent combination of moderate- and vigorous-intensity  aerobic activity. Aerobic activity should be performed in episodes of at least 10 minutes, and preferably, it should be spread throughout the week.  Behavioral modification strategies: meal planning and cooking strategies and keeping healthy foods in the home.  [name] has agreed to follow-up with our clinic in 2-3 weeks. She was informed of the importance of frequent follow-up visits to maximize her success with intensive lifestyle modifications for her multiple health conditions.   Objective:   Blood pressure 126/71, pulse 64, temperature 98 F (36.7 C), temperature source Oral, height 5\' 8"  (1.727 m), weight (!) 312 lb (141.5 kg), last menstrual period 11/13/2019, SpO2 98 %. Body mass index is 47.44 kg/m.  General: Cooperative, alert, well developed, in no acute distress. HEENT: Conjunctivae and lids unremarkable. Cardiovascular: Regular rhythm.  Lungs: Normal work of breathing. Neurologic: No focal deficits.   Lab Results  Component Value Date   CREATININE 0.69 04/23/2019   BUN 19 04/23/2019   NA 141 04/23/2019   K 4.4 04/23/2019   CL 104 04/23/2019   CO2 25 04/23/2019   Lab Results  Component Value Date   ALT 18 04/23/2019   AST 11 04/23/2019   ALKPHOS 65 04/23/2019   BILITOT 0.3 04/23/2019   Lab Results  Component Value Date   HGBA1C 5.2 04/23/2019   HGBA1C 5.5 11/30/2018   HGBA1C 5.1 07/17/2018   HGBA1C 5.3 02/16/2018   HGBA1C 5.5 11/02/2017   Lab Results  Component Value Date   INSULIN 14.6 04/23/2019   INSULIN 9.0 01/02/2019   INSULIN 15.0 07/17/2018  INSULIN 16.5 02/16/2018   INSULIN 35.6 (H) 11/02/2017   Lab Results  Component Value Date   TSH 1.41 11/30/2018   Lab Results  Component Value Date   CHOL 178 04/23/2019   HDL 50 04/23/2019   LDLCALC 112 (H) 04/23/2019   TRIG 89 04/23/2019   CHOLHDL 3 05/25/2017   Lab Results  Component Value Date   WBC 6.7 05/22/2019   HGB 13.0 05/22/2019   HCT 39.8 05/22/2019   MCV 86.4 05/22/2019   PLT  268.0 05/22/2019   Lab Results  Component Value Date   IRON 30 11/02/2017   TIBC 337 11/02/2017   FERRITIN 28 11/02/2017   Attestation Statements:   Reviewed by clinician on day of visit: allergies, medications, problem list, medical history, surgical history, family history, social history, and previous encounter notes.  Time spent on visit including pre-visit chart review and post-visit charting and care was 30 minutes.   IMichaelene Song, am acting as transcriptionist for Abby Potash, PA-C   I have reviewed the above documentation for accuracy and completeness, and I agree with the above. Abby Potash, PA-C

## 2019-12-28 ENCOUNTER — Ambulatory Visit: Payer: No Typology Code available for payment source | Admitting: Psychology

## 2019-12-31 ENCOUNTER — Ambulatory Visit (INDEPENDENT_AMBULATORY_CARE_PROVIDER_SITE_OTHER): Payer: No Typology Code available for payment source | Admitting: Psychology

## 2019-12-31 DIAGNOSIS — F411 Generalized anxiety disorder: Secondary | ICD-10-CM

## 2020-01-15 ENCOUNTER — Encounter (INDEPENDENT_AMBULATORY_CARE_PROVIDER_SITE_OTHER): Payer: Self-pay | Admitting: Physician Assistant

## 2020-01-15 ENCOUNTER — Ambulatory Visit (INDEPENDENT_AMBULATORY_CARE_PROVIDER_SITE_OTHER): Payer: No Typology Code available for payment source | Admitting: Physician Assistant

## 2020-01-15 ENCOUNTER — Other Ambulatory Visit: Payer: Self-pay

## 2020-01-15 VITALS — BP 117/78 | HR 66 | Temp 97.9°F | Ht 68.0 in | Wt 313.0 lb

## 2020-01-15 DIAGNOSIS — E559 Vitamin D deficiency, unspecified: Secondary | ICD-10-CM

## 2020-01-15 DIAGNOSIS — E7849 Other hyperlipidemia: Secondary | ICD-10-CM | POA: Diagnosis not present

## 2020-01-15 DIAGNOSIS — R7303 Prediabetes: Secondary | ICD-10-CM | POA: Diagnosis not present

## 2020-01-15 DIAGNOSIS — Z9189 Other specified personal risk factors, not elsewhere classified: Secondary | ICD-10-CM | POA: Diagnosis not present

## 2020-01-15 DIAGNOSIS — Z6841 Body Mass Index (BMI) 40.0 and over, adult: Secondary | ICD-10-CM

## 2020-01-15 NOTE — Progress Notes (Signed)
Chief Complaint:   OBESITY TEXANNA Gaines is here to discuss her progress with her obesity treatment plan along with follow-up of her obesity related diagnoses. [name] is keeping a food journal and adhering to recommended goals of 1800 calories and 115 grams of protein and states she is following her eating plan approximately 60-70% of the time. [name] states she is doing cardio 30 minutes 3 times per week and strength training 30 minutes 2 times per week.  Today's visit was #: 68 Starting weight: 363 lbs Starting date: 11/02/2017 Today's weight: 313 lbs Today's date: 01/15/2020 Total lbs lost to date: 50 Total lbs lost since last in-office visit: 0  Interim History: [name] reports that she has not been journaling often due to working nights and having a bridal shower. She has another bridal shower and a bachelorette party coming up.  Subjective:   Prediabetes. [name] has a diagnosis of prediabetes based on her elevated HgA1c and was informed this puts her at greater risk of developing diabetes. She continues to work on diet and exercise to decrease her risk of diabetes. She denies nausea or hypoglycemia. [name] is on metformin, which she is tolerating well.  Lab Results  Component Value Date   HGBA1C 5.2 04/23/2019   Lab Results  Component Value Date   INSULIN 14.6 04/23/2019   INSULIN 9.0 01/02/2019   INSULIN 15.0 07/17/2018   INSULIN 16.5 02/16/2018   INSULIN 35.6 (H) 11/02/2017   Other hyperlipidemia. [name] has hyperlipidemia and has been trying to improve her cholesterol levels with intensive lifestyle modification including a low saturated fat diet, exercise and weight loss. She denies any chest pain, claudication or myalgias. [name] is on no medication. She is exercising regularly.  Lab Results  Component Value Date   ALT 18 04/23/2019   AST 11 04/23/2019   ALKPHOS 65 04/23/2019   BILITOT 0.3 04/23/2019   Lab Results  Component Value Date   CHOL 178  04/23/2019   HDL 50 04/23/2019   LDLCALC 112 (H) 04/23/2019   TRIG 89 04/23/2019   CHOLHDL 3 05/25/2017   Vitamin D deficiency. [name] is on daily Vitamin D 5,000 units without complaints.   Ref. Range 04/23/2019 09:20  Vitamin D, 25-Hydroxy Latest Ref Range: 30.0 - 100.0 ng/mL 49.2   At risk for heart disease. [name] is at a higher than average risk for cardiovascular disease due to obesity.   Assessment/Plan:   Prediabetes. [name] will continue to work on weight loss, exercise, and decreasing simple carbohydrates to help decrease the risk of diabetes. She will continue her medication as directed. Hemoglobin A1c, Insulin, random labs will be checked today.   Other hyperlipidemia. Cardiovascular risk and specific lipid/LDL goals reviewed.  We discussed several lifestyle modifications today and [name] will continue to work on diet, exercise and weight loss efforts. Orders and follow up as documented in patient record.   Counseling Intensive lifestyle modifications are the first line treatment for this issue. . Dietary changes: Increase soluble fiber. Decrease simple carbohydrates. . Exercise changes: Moderate to vigorous-intensity aerobic activity 150 minutes per week if tolerated. . Lipid-lowering medications: see documented in medical record. Comprehensive metabolic panel, Lipid panel  Vitamin D deficiency. Low Vitamin D level contributes to fatigue and are associated with obesity, breast, and colon cancer. She agrees to continue to take Vitamin D as directed and will follow-up for routine testing of Vitamin D, at least 2-3 times per year to avoid over-replacement. VITAMIN D 25 Hydroxy (Vit-D Deficiency,  Fractures)  At risk for heart disease. [name] was given approximately 15 minutes of coronary artery disease prevention counseling today. She is 34 y.o. female and has risk factors for heart disease including obesity. We discussed intensive lifestyle modifications today with an  emphasis on specific weight loss instructions and strategies.   Repetitive spaced learning was employed today to elicit superior memory formation and behavioral change.  Class 3 severe obesity with serious comorbidity and body mass index (BMI) of 45.0 to 49.9 in adult, unspecified obesity type ([name]).  [name] is currently in the action stage of change. As such, her goal is to continue with weight loss efforts. She has agreed to keeping a food journal and adhering to recommended goals of 1800 calories and 115 grams of protein daily.   Exercise goals: For substantial health benefits, adults should do at least 150 minutes (2 hours and 30 minutes) a week of moderate-intensity, or 75 minutes (1 hour and 15 minutes) a week of vigorous-intensity aerobic physical activity, or an equivalent combination of moderate- and vigorous-intensity aerobic activity. Aerobic activity should be performed in episodes of at least 10 minutes, and preferably, it should be spread throughout the week.  Behavioral modification strategies: meal planning and cooking strategies and celebration eating strategies.  [name] has agreed to follow-up with our clinic in 2 weeks. She was informed of the importance of frequent follow-up visits to maximize her success with intensive lifestyle modifications for her multiple health conditions.   [name] was informed we would discuss her lab results at her next visit unless there is a critical issue that needs to be addressed sooner. [name] agreed to keep her next visit at the agreed upon time to discuss these results.  Objective:   Blood pressure 117/78, pulse 66, temperature 97.9 F (36.6 C), temperature source Oral, height 5\' 8"  (1.727 m), weight (!) 313 lb (142 kg), SpO2 100 %. Body mass index is 47.59 kg/m.  General: Cooperative, alert, well developed, in no acute distress. HEENT: Conjunctivae and lids unremarkable. Cardiovascular: Regular rhythm.  Lungs: Normal work of  breathing. Neurologic: No focal deficits.   Lab Results  Component Value Date   CREATININE 0.69 04/23/2019   BUN 19 04/23/2019   NA 141 04/23/2019   K 4.4 04/23/2019   CL 104 04/23/2019   CO2 25 04/23/2019   Lab Results  Component Value Date   ALT 18 04/23/2019   AST 11 04/23/2019   ALKPHOS 65 04/23/2019   BILITOT 0.3 04/23/2019   Lab Results  Component Value Date   HGBA1C 5.2 04/23/2019   HGBA1C 5.5 11/30/2018   HGBA1C 5.1 07/17/2018   HGBA1C 5.3 02/16/2018   HGBA1C 5.5 11/02/2017   Lab Results  Component Value Date   INSULIN 14.6 04/23/2019   INSULIN 9.0 01/02/2019   INSULIN 15.0 07/17/2018   INSULIN 16.5 02/16/2018   INSULIN 35.6 (H) 11/02/2017   Lab Results  Component Value Date   TSH 1.41 11/30/2018   Lab Results  Component Value Date   CHOL 178 04/23/2019   HDL 50 04/23/2019   LDLCALC 112 (H) 04/23/2019   TRIG 89 04/23/2019   CHOLHDL 3 05/25/2017   Lab Results  Component Value Date   WBC 6.7 05/22/2019   HGB 13.0 05/22/2019   HCT 39.8 05/22/2019   MCV 86.4 05/22/2019   PLT 268.0 05/22/2019   Lab Results  Component Value Date   IRON 30 11/02/2017   TIBC 337 11/02/2017   FERRITIN 28 11/02/2017   Attestation Statements:  Reviewed by clinician on day of visit: allergies, medications, problem list, medical history, surgical history, family history, social history, and previous encounter notes.  IMichaelene Song, am acting as transcriptionist for Abby Potash, PA-C   I have reviewed the above documentation for accuracy and completeness, and I agree with the above. Abby Potash, PA-C

## 2020-01-16 LAB — LIPID PANEL
Chol/HDL Ratio: 3.7 ratio (ref 0.0–4.4)
Cholesterol, Total: 197 mg/dL (ref 100–199)
HDL: 53 mg/dL (ref >39)
LDL Chol Calc (NIH): 124 mg/dL — ABNORMAL HIGH (ref 0–99)
Triglycerides: 111 mg/dL (ref 0–149)
VLDL Cholesterol Cal: 20 mg/dL (ref 5–40)

## 2020-01-16 LAB — COMPREHENSIVE METABOLIC PANEL
ALT: 18 IU/L (ref 0–32)
AST: 10 IU/L (ref 0–40)
Albumin/Globulin Ratio: 1.5 (ref 1.2–2.2)
Albumin: 4.2 g/dL (ref 3.8–4.8)
Alkaline Phosphatase: 69 IU/L (ref 48–121)
BUN/Creatinine Ratio: 23 (ref 9–23)
BUN: 15 mg/dL (ref 6–20)
Bilirubin Total: 0.3 mg/dL (ref 0.0–1.2)
CO2: 25 mmol/L (ref 20–29)
Calcium: 9.4 mg/dL (ref 8.7–10.2)
Chloride: 101 mmol/L (ref 96–106)
Creatinine, Ser: 0.66 mg/dL (ref 0.57–1.00)
GFR calc Af Amer: 135 mL/min/{1.73_m2} (ref >59)
GFR calc non Af Amer: 117 mL/min/{1.73_m2} (ref >59)
Globulin, Total: 2.8 g/dL (ref 1.5–4.5)
Glucose: 79 mg/dL (ref 65–99)
Potassium: 4.7 mmol/L (ref 3.5–5.2)
Sodium: 142 mmol/L (ref 134–144)
Total Protein: 7 g/dL (ref 6.0–8.5)

## 2020-01-16 LAB — HEMOGLOBIN A1C
Est. average glucose Bld gHb Est-mCnc: 105 mg/dL
Hgb A1c MFr Bld: 5.3 % (ref 4.8–5.6)

## 2020-01-16 LAB — INSULIN, RANDOM: INSULIN: 12.6 u[IU]/mL (ref 2.6–24.9)

## 2020-01-16 LAB — VITAMIN D 25 HYDROXY (VIT D DEFICIENCY, FRACTURES): Vit D, 25-Hydroxy: 51.3 ng/mL (ref 30.0–100.0)

## 2020-01-28 ENCOUNTER — Ambulatory Visit (INDEPENDENT_AMBULATORY_CARE_PROVIDER_SITE_OTHER): Payer: No Typology Code available for payment source | Admitting: Family Medicine

## 2020-02-05 ENCOUNTER — Other Ambulatory Visit: Payer: Self-pay

## 2020-02-05 ENCOUNTER — Ambulatory Visit (INDEPENDENT_AMBULATORY_CARE_PROVIDER_SITE_OTHER): Payer: No Typology Code available for payment source | Admitting: Physician Assistant

## 2020-02-05 ENCOUNTER — Encounter (INDEPENDENT_AMBULATORY_CARE_PROVIDER_SITE_OTHER): Payer: Self-pay | Admitting: Physician Assistant

## 2020-02-05 VITALS — BP 114/75 | HR 67 | Temp 97.9°F | Ht 68.0 in | Wt 317.0 lb

## 2020-02-05 DIAGNOSIS — Z6841 Body Mass Index (BMI) 40.0 and over, adult: Secondary | ICD-10-CM | POA: Diagnosis not present

## 2020-02-05 DIAGNOSIS — E7849 Other hyperlipidemia: Secondary | ICD-10-CM

## 2020-02-05 DIAGNOSIS — E559 Vitamin D deficiency, unspecified: Secondary | ICD-10-CM | POA: Diagnosis not present

## 2020-02-06 NOTE — Progress Notes (Signed)
Chief Complaint:   OBESITY [name] is here to discuss her progress with her obesity treatment plan along with follow-up of her obesity related diagnoses. [name] is keeping a food journal and adhering to recommended goals of 1800 calories and 115 grams of protein and states she is following her eating plan approximately 85-90% of the time. [name] states she is doing cardio/strengthening 30 minutes 2-3 times per week.  Today's visit was #: 59 Starting weight: 363 lbs Starting date: 11/02/2017 Today's weight: 317 lbs Today's date: 02/05/2020 Total lbs lost to date: 46 Total lbs lost since last in-office visit: 0  Interim History: [name] reports that with the stress of the wedding coming up and stress with work she is not meal prepping or planning as well. She is not journaling consistently.  Subjective:   Other hyperlipidemia. [name] has hyperlipidemia and has been trying to improve her cholesterol levels with intensive lifestyle modification including a low saturated fat diet, exercise and weight loss. She denies any chest pain, claudication or myalgias. Last LDL was not within normal range. [name] is on no medication.  Lab Results  Component Value Date   ALT 18 01/15/2020   AST 10 01/15/2020   ALKPHOS 69 01/15/2020   BILITOT 0.3 01/15/2020   Lab Results  Component Value Date   CHOL 197 01/15/2020   HDL 53 01/15/2020   LDLCALC 124 (H) 01/15/2020   TRIG 111 01/15/2020   CHOLHDL 3.7 01/15/2020   Vitamin D deficiency. [name] is on Vitamin D supplementation, which she is tolerating well.   Ref. Range 01/15/2020 12:28  Vitamin D, 25-Hydroxy Latest Ref Range: 30.0 - 100.0 ng/mL 51.3   Assessment/Plan:   Other hyperlipidemia. Cardiovascular risk and specific lipid/LDL goals reviewed.  We discussed several lifestyle modifications today and Keleigh will continue to work on diet, exercise and weight loss efforts. Orders and follow up as documented in patient record.  She will continue her meal plan as directed.   Counseling Intensive lifestyle modifications are the first line treatment for this issue. . Dietary changes: Increase soluble fiber. Decrease simple carbohydrates. . Exercise changes: Moderate to vigorous-intensity aerobic activity 150 minutes per week if tolerated. . Lipid-lowering medications: see documented in medical record.  Vitamin D deficiency. Low Vitamin D level contributes to fatigue and are associated with obesity, breast, and colon cancer. She agrees to continue to take Vitamin D as directed and will follow-up for routine testing of Vitamin D, at least 2-3 times per year to avoid over-replacement.  Class 3 severe obesity with serious comorbidity and body mass index (BMI) of 45.0 to 49.9 in adult, unspecified obesity type ([place]).  [name] is currently in the action stage of change. As such, her goal is to continue with weight loss efforts. She has agreed to keeping a food journal and adhering to recommended goals of 1800 calories and 115 grams of protein daily.   Exercise goals: For substantial health benefits, adults should do at least 150 minutes (2 hours and 30 minutes) a week of moderate-intensity, or 75 minutes (1 hour and 15 minutes) a week of vigorous-intensity aerobic physical activity, or an equivalent combination of moderate- and vigorous-intensity aerobic activity. Aerobic activity should be performed in episodes of at least 10 minutes, and preferably, it should be spread throughout the week.  Behavioral modification strategies: meal planning and cooking strategies and keeping healthy foods in the home.  [name] has agreed to follow-up with our clinic in 2 weeks. She was informed of  the importance of frequent follow-up visits to maximize her success with intensive lifestyle modifications for her multiple health conditions.   Objective:   Blood pressure 114/75, pulse 67, temperature 97.9 F (36.6 C), temperature source Oral,  height 5\' 8"  (1.727 m), weight (!) 317 lb (143.8 kg), last menstrual period 01/13/2020, SpO2 100 %. Body mass index is 48.2 kg/m.  General: Cooperative, alert, well developed, in no acute distress. HEENT: Conjunctivae and lids unremarkable. Cardiovascular: Regular rhythm.  Lungs: Normal work of breathing. Neurologic: No focal deficits.   Lab Results  Component Value Date   CREATININE 0.66 01/15/2020   BUN 15 01/15/2020   NA 142 01/15/2020   K 4.7 01/15/2020   CL 101 01/15/2020   CO2 25 01/15/2020   Lab Results  Component Value Date   ALT 18 01/15/2020   AST 10 01/15/2020   ALKPHOS 69 01/15/2020   BILITOT 0.3 01/15/2020   Lab Results  Component Value Date   HGBA1C 5.3 01/15/2020   HGBA1C 5.2 04/23/2019   HGBA1C 5.5 11/30/2018   HGBA1C 5.1 07/17/2018   HGBA1C 5.3 02/16/2018   Lab Results  Component Value Date   INSULIN 12.6 01/15/2020   INSULIN 14.6 04/23/2019   INSULIN 9.0 01/02/2019   INSULIN 15.0 07/17/2018   INSULIN 16.5 02/16/2018   Lab Results  Component Value Date   TSH 1.41 11/30/2018   Lab Results  Component Value Date   CHOL 197 01/15/2020   HDL 53 01/15/2020   LDLCALC 124 (H) 01/15/2020   TRIG 111 01/15/2020   CHOLHDL 3.7 01/15/2020   Lab Results  Component Value Date   WBC 6.7 05/22/2019   HGB 13.0 05/22/2019   HCT 39.8 05/22/2019   MCV 86.4 05/22/2019   PLT 268.0 05/22/2019   Lab Results  Component Value Date   IRON 30 11/02/2017   TIBC 337 11/02/2017   FERRITIN 28 11/02/2017   Attestation Statements:   Reviewed by clinician on day of visit: allergies, medications, problem list, medical history, surgical history, family history, social history, and previous encounter notes.  Time spent on visit including pre-visit chart review and post-visit charting and care was 30 minutes.   IMichaelene Song, am acting as transcriptionist for Abby Potash, PA-C   I have reviewed the above documentation for accuracy and completeness, and I agree  with the above. Abby Potash, PA-C

## 2020-02-13 ENCOUNTER — Other Ambulatory Visit: Payer: Self-pay | Admitting: Family Medicine

## 2020-02-13 ENCOUNTER — Other Ambulatory Visit: Payer: Self-pay

## 2020-02-13 ENCOUNTER — Encounter: Payer: Self-pay | Admitting: Family Medicine

## 2020-02-13 ENCOUNTER — Ambulatory Visit (INDEPENDENT_AMBULATORY_CARE_PROVIDER_SITE_OTHER): Payer: No Typology Code available for payment source | Admitting: Family Medicine

## 2020-02-13 VITALS — BP 122/66 | HR 80 | Temp 98.0°F | Ht 67.99 in | Wt 318.2 lb

## 2020-02-13 DIAGNOSIS — Q103 Other congenital malformations of eyelid: Secondary | ICD-10-CM

## 2020-02-13 DIAGNOSIS — E282 Polycystic ovarian syndrome: Secondary | ICD-10-CM | POA: Diagnosis not present

## 2020-02-13 DIAGNOSIS — R7303 Prediabetes: Secondary | ICD-10-CM

## 2020-02-13 DIAGNOSIS — Z8249 Family history of ischemic heart disease and other diseases of the circulatory system: Secondary | ICD-10-CM | POA: Diagnosis not present

## 2020-02-13 MED ORDER — METFORMIN HCL ER 500 MG PO TB24: 1000.0000 mg | ORAL_TABLET | Freq: Every day | ORAL | 1 refills | Status: DC

## 2020-02-13 NOTE — Patient Instructions (Signed)
Great to see you . Refilled metformin.  Next appt phjyscial

## 2020-02-13 NOTE — Progress Notes (Signed)
Patient ID: [name], female  DOB: 11-11-1985, 34 y.o.   MRN: [id number] Patient Care Team    Relationship Specialty Notifications Start End  Ma Hillock, DO PCP - General Family Medicine  04/25/15   Servando Salina, MD Consulting Physician Obstetrics and Gynecology  06/18/16   Wellington Hampshire, MD Consulting Physician Cardiology  06/18/16   Starlyn Skeans, MD Consulting Physician Family Medicine  05/26/18    Comment: Weight loss clinic    Chief Complaint  Patient presents with  . Follow-up  . Medication Refill    pt would like a refill of Metformin    Subjective: [name] is a 34 y.o.  Female  present for  prediabetes/morbid obesity/PCOS/BPES She is doing ok. Stress at work with [employer] cases increasing. Reports compliance  with metformin 1000 mg QD. She is following with gyn for hormone replacement.   She has been dieting and exercising. She is following with Dr. Trixie Rude for her obesity.    Depression screen [facility] Vanessa 2/9 02/13/2020 05/22/2019 11/30/2018 11/02/2017 05/25/2017  Decreased Interest 0 0 0 3 0  Down, Depressed, Hopeless 0 0 0 2 0  PHQ - 2 Score 0 0 0 5 0  Altered sleeping - - - 2 -  Tired, decreased energy - - - 3 -  Change in appetite - - - 3 -  Feeling bad or failure about yourself  - - - 3 -  Trouble concentrating - - - 1 -  Moving slowly or fidgety/restless - - - 1 -  Suicidal thoughts - - - 0 -  PHQ-9 Score - - - 18 -  Difficult doing work/chores - - - Somewhat difficult -   No flowsheet data found.   Immunization History  Administered Date(s) Administered  . HPV 9-valent 08/01/2014, 09/10/2014, 01/28/2015  . Influenza,inj,Quad PF,6+ Mos 03/21/2014  . Influenza-Unspecified 03/22/2015, 03/15/2017, 04/06/2018  . PFIZER SARS-COV-2 Vaccination 06/18/2019, 07/07/2019  . Tdap 08/01/2005, 06/09/2015    Past Medical History:  Diagnosis Date  . Anemia   . Back pain   . BPES syndrome   . Enlarged thyroid   . Infertility, female   .  Knee dislocation 2010   Left  . Obesity   . Palpitations   . PCOS (polycystic ovarian syndrome)   . PONV (postoperative nausea and vomiting)   . Prediabetes   . Premature ovarian failure   . Vitamin B 12 deficiency   . Vitamin D deficiency   . Wears eyeglasses    No Known Allergies Past Surgical History:  Procedure Laterality Date  . EYE SURGERY Bilateral   . NASAL SEPTOPLASTY W/ TURBINOPLASTY Bilateral 03/04/2017   Procedure: NASAL SEPTOPLASTY WITH BILATERAL INFERIOR TURBINATE REDUCTION;  Surgeon: Jerrell Belfast, MD;  Location: [place];  Service: ENT;  Laterality: Bilateral;  . NASAL SEPTUM SURGERY  03/04/2017  . TONSILLECTOMY AND ADENOIDECTOMY Bilateral 03/04/2017   Procedure: TONSILLECTOMY;  Surgeon: Jerrell Belfast, MD;  Location[facility] OR;  Service: ENT;  Laterality: Bilateral;   Family History  Problem Relation Age of Onset  . Hyperlipidemia Father   . Hypertension Father   . Heart disease Father   . Sudden death Father        Age 71  . Other Father        BPES  . Heart attack Father   . Other Other        BPES  . Other Other        BPES  .  Hyperlipidemia Mother   . Hypertension Mother   . Diabetes Mother   . Anxiety disorder Mother   . Obesity Mother   . Other Brother        BPES  . Other Sister        BPES  . Other Paternal Uncle        BPES  . Other Cousin        BPES  . Other Cousin        BPES  . Heart attack Maternal Grandmother   . Heart disease Maternal Grandfather   . Heart attack Maternal Grandfather   . Lung cancer Maternal Grandfather   . Heart disease Paternal Grandfather heartr attack  . Heart attack Paternal Grandfather 59       MI  . Sudden death Paternal Grandfather    Social History   Social History Narrative   [name] lives alone. 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    Allergies as of 02/13/2020   No Known Allergies     Medication List       Accurate as of February 13, 2020 10:36 AM. If you have  any questions, ask your nurse or doctor.        Cholecalciferol 125 MCG (5000 UT) capsule Take 1 capsule (5,000 Units total) by mouth daily. With meal   medroxyPROGESTERone 5 MG tablet Commonly known as: PROVERA Take 1 tablet by mouth every 30 (thirty) days. Pt takes 12 tablets a month.   metFORMIN 500 MG 24 hr tablet Commonly known as: GLUCOPHAGE-XR Take 2 tablets (1,000 mg total) by mouth daily.   multivitamin capsule Take 1 capsule by mouth daily.   Premarin 0.625 MG tablet Generic drug: estrogens (conjugated)       All past medical history, surgical history, allergies, family history, immunizations andmedications were updated in the EMR today and reviewed under the history and medication portions of their EMR.      ROS: 14 pt review of systems performed and negative (unless mentioned in an HPI)  Objective: BP 122/66 (BP Location: Left Arm, Patient Position: Sitting)   Pulse 80   Temp 98 F (36.7 C)   Ht 5' 7.99" (1.727 m)   Wt (!) 318 lb 3.2 oz (144.3 kg)   SpO2 99%   BMI 48.39 kg/m  Gen: Afebrile. No acute distress. Nontoxic.  HENT: AT. [place]. Eyes:Pupils Equal Round Reactive to light, Extraocular movements intact,  Conjunctiva without redness, discharge or icterus. CV: RRR Chest: CTAB, no wheeze or crackles Skin: no rashes, purpura or petechiae.  Neuro: Normal gait. PERLA. EOMi. Alert. Oriented x3 Psych: Normal affect, dress and demeanor. Normal speech. Normal thought content and judgment.    No exam data present  Assessment/plan: [name] is a 34 y.o. female present for CPE Prediabetes Morbid obesity ([place]) PCBMI 50.0-59.9, adult ([place]) PCOS (polycystic ovarian syndrome) A1c 5.3 (01/15/2020) - metformin use mostly for PCOS. - Continued/refilled metformin 1000 mg total QD  - She is seeing weight management clinic. She has made dietary changes. She has lost some weight. 336> 317 in the last 1.5 years - continue f/u with GYN, for hormone  replacement - Routine exercise > 150 min a week encouraged  - reviewed all labs collected last 6 months with other providers.  - F/U 6 mos.   BPES syndrome/Vitamin D deficiency DEXA 08/2019 normal completed early for h/o of BPES and Vit. D def.   FH cardiac diease -  provided information on CT score/image through [facility] (self pay). If she needs an order will be happy to place this for her- but we we will need to know the location she decided on.    Return in about 6 months (around 07/31/2020) for CPE (30 min).  No orders of the defined types were placed in this encounter.   Electronically signed by: Howard Pouch, DO [place]

## 2020-02-18 ENCOUNTER — Ambulatory Visit (INDEPENDENT_AMBULATORY_CARE_PROVIDER_SITE_OTHER): Payer: No Typology Code available for payment source | Admitting: Psychology

## 2020-02-18 DIAGNOSIS — F411 Generalized anxiety disorder: Secondary | ICD-10-CM | POA: Diagnosis not present

## 2020-02-19 ENCOUNTER — Ambulatory Visit (INDEPENDENT_AMBULATORY_CARE_PROVIDER_SITE_OTHER): Payer: No Typology Code available for payment source | Admitting: Physician Assistant

## 2020-02-19 ENCOUNTER — Other Ambulatory Visit: Payer: Self-pay

## 2020-02-19 VITALS — BP 131/84 | HR 63 | Temp 98.5°F | Ht 68.0 in | Wt 317.0 lb

## 2020-02-19 DIAGNOSIS — E559 Vitamin D deficiency, unspecified: Secondary | ICD-10-CM | POA: Diagnosis not present

## 2020-02-19 DIAGNOSIS — Z6841 Body Mass Index (BMI) 40.0 and over, adult: Secondary | ICD-10-CM

## 2020-02-19 DIAGNOSIS — R7303 Prediabetes: Secondary | ICD-10-CM | POA: Diagnosis not present

## 2020-02-20 NOTE — Progress Notes (Signed)
Chief Complaint:   OBESITY [name] is here to discuss her progress with her obesity treatment plan along with follow-up of her obesity related diagnoses. [name] is on the Category 4 Plan and journaling 1800 calories and 110-120 grams of protein and states she is following her eating plan approximately 80% of the time. [name] states she is walking 30 minutes 1 time per week.  Today's visit was #: 47 Starting weight: 363 Starting date: 11/02/2017 Today's weight: 312 lbs Today's date: 02/19/2020 Total lbs lost to date: 51 Total lbs lost since last in-office visit: 5  Interim History: [name] reports that she has been meal prepping more often and is also journaling more consistently. She is asking about simple breakfast options today as she is getting married and will be on her honeymoon next week.  Subjective:   Prediabetes. [name] has a diagnosis of prediabetes based on her elevated HgA1c and was informed this puts her at greater risk of developing diabetes. She continues to work on diet and exercise to decrease her risk of diabetes. She denies nausea or hypoglycemia. [name] is on metformin. She reports polyphagia increases with stress.  Lab Results  Component Value Date   HGBA1C 5.3 01/15/2020   Lab Results  Component Value Date   INSULIN 12.6 01/15/2020   INSULIN 14.6 04/23/2019   INSULIN 9.0 01/02/2019   INSULIN 15.0 07/17/2018   INSULIN 16.5 02/16/2018   Vitamin D deficiency. [name] is on Vitamin D daily. She states she will be outside a great deal for her upcoming honeymoon.   Ref. Range 01/15/2020 12:28  Vitamin D, 25-Hydroxy Latest Ref Range: 30.0 - 100.0 ng/mL 51.3   Assessment/Plan:   Prediabetes. [name] will continue to work on weight loss, exercise, and decreasing simple carbohydrates to help decrease the risk of diabetes. She will continue her medication as directed.   Vitamin D deficiency. Low Vitamin D level contributes to fatigue and are  associated with obesity, breast, and colon cancer. She agrees to continue to take Vitamin D as directed and will follow-up for routine testing of Vitamin D, at least 2-3 times per year to avoid over-replacement.  Class 3 severe obesity with serious comorbidity and body mass index (BMI) of 45.0 to 49.9 in adult, unspecified obesity type ([place]).  [name] is currently in the action stage of change. As such, her goal is to continue with weight loss efforts. She has agreed to keeping a food journal and adhering to recommended goals of 1800 calories and 115 grams of protein daily.   Exercise goals: For substantial health benefits, adults should do at least 150 minutes (2 hours and 30 minutes) a week of moderate-intensity, or 75 minutes (1 hour and 15 minutes) a week of vigorous-intensity aerobic physical activity, or an equivalent combination of moderate- and vigorous-intensity aerobic activity. Aerobic activity should be performed in episodes of at least 10 minutes, and preferably, it should be spread throughout the week.  Behavioral modification strategies: meal planning and cooking strategies and avoiding temptations.  [name] has agreed to follow-up with our clinic in 3-4 weeks. She was informed of the importance of frequent follow-up visits to maximize her success with intensive lifestyle modifications for her multiple health conditions.   Objective:   Blood pressure 131/84, pulse 63, temperature 98.5 F (36.9 C), height 5\' 8"  (1.727 m), weight (!) 317 lb (143.8 kg), last menstrual period 02/15/2020, SpO2 97 %. Body mass index is 48.2 kg/m.  General: Cooperative, alert, well developed, in no  acute distress. HEENT: Conjunctivae and lids unremarkable. Cardiovascular: Regular rhythm.  Lungs: Normal work of breathing. Neurologic: No focal deficits.   Lab Results  Component Value Date   CREATININE 0.66 01/15/2020   BUN 15 01/15/2020   NA 142 01/15/2020   K 4.7 01/15/2020   CL 101 01/15/2020     CO2 25 01/15/2020   Lab Results  Component Value Date   ALT 18 01/15/2020   AST 10 01/15/2020   ALKPHOS 69 01/15/2020   BILITOT 0.3 01/15/2020   Lab Results  Component Value Date   HGBA1C 5.3 01/15/2020   HGBA1C 5.2 04/23/2019   HGBA1C 5.5 11/30/2018   HGBA1C 5.1 07/17/2018   HGBA1C 5.3 02/16/2018   Lab Results  Component Value Date   INSULIN 12.6 01/15/2020   INSULIN 14.6 04/23/2019   INSULIN 9.0 01/02/2019   INSULIN 15.0 07/17/2018   INSULIN 16.5 02/16/2018   Lab Results  Component Value Date   TSH 1.41 11/30/2018   Lab Results  Component Value Date   CHOL 197 01/15/2020   HDL 53 01/15/2020   LDLCALC 124 (H) 01/15/2020   TRIG 111 01/15/2020   CHOLHDL 3.7 01/15/2020   Lab Results  Component Value Date   WBC 6.7 05/22/2019   HGB 13.0 05/22/2019   HCT 39.8 05/22/2019   MCV 86.4 05/22/2019   PLT 268.0 05/22/2019   Lab Results  Component Value Date   IRON 30 11/02/2017   TIBC 337 11/02/2017   FERRITIN 28 11/02/2017   Attestation Statements:   Reviewed by clinician on day of visit: allergies, medications, problem list, medical history, surgical history, family history, social history, and previous encounter notes.  Time spent on visit including pre-visit chart review and post-visit charting and care was 32 minutes.   IMichaelene Song, am acting as transcriptionist for Abby Potash, PA-C   I have reviewed the above documentation for accuracy and completeness, and I agree with the above. Abby Potash, PA-C

## 2020-03-11 ENCOUNTER — Encounter (INDEPENDENT_AMBULATORY_CARE_PROVIDER_SITE_OTHER): Payer: Self-pay | Admitting: Physician Assistant

## 2020-03-11 ENCOUNTER — Ambulatory Visit (INDEPENDENT_AMBULATORY_CARE_PROVIDER_SITE_OTHER): Payer: No Typology Code available for payment source | Admitting: Physician Assistant

## 2020-03-11 ENCOUNTER — Other Ambulatory Visit: Payer: Self-pay

## 2020-03-11 VITALS — BP 117/67 | HR 65 | Temp 98.1°F | Ht 68.0 in | Wt 313.0 lb

## 2020-03-11 DIAGNOSIS — E559 Vitamin D deficiency, unspecified: Secondary | ICD-10-CM | POA: Diagnosis not present

## 2020-03-11 DIAGNOSIS — Z6841 Body Mass Index (BMI) 40.0 and over, adult: Secondary | ICD-10-CM | POA: Diagnosis not present

## 2020-03-11 DIAGNOSIS — E7849 Other hyperlipidemia: Secondary | ICD-10-CM | POA: Diagnosis not present

## 2020-03-12 NOTE — Progress Notes (Signed)
Chief Complaint:   [name] is here to discuss her progress with her Vanessa treatment plan along with follow-up of her Vanessa related diagnoses. [name] is on the Category 4 Plan and journaling and states she is following her eating plan approximately 75% of the time. [name] states she is doing cardio 1-2 times per week.  Today's visit was #: 61 Starting weight: 363 lbs Starting date: 11/02/2017 Today's weight: 313 lbs Today's date: 03/11/2020 Total lbs lost to date: 50 Total lbs lost since last in-office visit: 4  Interim History: [name] just returned from her honeymoon and did a great job making good choices. She is motivated to get fully back on plan.  Subjective:   Other hyperlipidemia. [name] has hyperlipidemia and has been trying to improve her cholesterol levels with intensive lifestyle modification including a low saturated fat diet, exercise and weight loss. She denies any chest pain, claudication or myalgias. Last LDL was not at goal. [name] is on no medication.  Lab Results  Component Value Date   ALT 18 01/15/2020   AST 10 01/15/2020   ALKPHOS 69 01/15/2020   BILITOT 0.3 01/15/2020   Lab Results  Component Value Date   CHOL 197 01/15/2020   HDL 53 01/15/2020   LDLCALC 124 (H) 01/15/2020   TRIG 111 01/15/2020   CHOLHDL 3.7 01/15/2020   Vitamin D deficiency. [name] is on Vitamin D daily. Last level was at goal.   Ref. Range 01/15/2020 12:28  Vitamin D, 25-Hydroxy Latest Ref Range: 30.0 - 100.0 ng/mL 51.3   Assessment/Plan:   Other hyperlipidemia. Cardiovascular risk and specific lipid/LDL goals reviewed.  We discussed several lifestyle modifications today and [name] will continue to work on diet, exercise and weight loss efforts. Orders and follow up as documented in patient record. Will continue to monitor lipid panel.  Counseling Intensive lifestyle modifications are the first line treatment for this issue. . Dietary changes:  Increase soluble fiber. Decrease simple carbohydrates. . Exercise changes: Moderate to vigorous-intensity aerobic activity 150 minutes per week if tolerated. . Lipid-lowering medications: see documented in medical record.  Vitamin D deficiency. Low Vitamin D level contributes to fatigue and are associated with Vanessa, breast, and colon cancer. She agrees to continue to take Vitamin D as directed and will continue to monitor Vitamin D level.  Class 3 severe Vanessa with serious comorbidity and body mass index (BMI) of 45.0 to 49.9 in adult, unspecified Vanessa type ([name]).  [name] is currently in the action stage of change. As such, her goal is to continue with weight loss efforts. She has agreed to keeping a food journal and adhering to recommended goals of 1800 calories and 115 grams of protein daily.   Exercise goals: For substantial health benefits, adults should do at least 150 minutes (2 hours and 30 minutes) a week of moderate-intensity, or 75 minutes (1 hour and 15 minutes) a week of vigorous-intensity aerobic physical activity, or an equivalent combination of moderate- and vigorous-intensity aerobic activity. Aerobic activity should be performed in episodes of at least 10 minutes, and preferably, it should be spread throughout the week.  Behavioral modification strategies: meal planning and cooking strategies and keeping healthy foods in the home.  [name] has agreed to follow-up with our clinic in 2-3 weeks. She was informed of the importance of frequent follow-up visits to maximize her success with intensive lifestyle modifications for her multiple health conditions.   Objective:   Blood pressure 117/67, pulse 65, temperature 98.1 F (  36.7 C), height 5\' 8"  (1.727 m), weight (!) 313 lb (142 kg), last menstrual period 02/15/2020, SpO2 98 %. Body mass index is 47.59 kg/m.  General: Cooperative, alert, well developed, in no acute distress. HEENT: Conjunctivae and lids  unremarkable. Cardiovascular: Regular rhythm.  Lungs: Normal work of breathing. Neurologic: No focal deficits.   Lab Results  Component Value Date   CREATININE 0.66 01/15/2020   BUN 15 01/15/2020   NA 142 01/15/2020   K 4.7 01/15/2020   CL 101 01/15/2020   CO2 25 01/15/2020   Lab Results  Component Value Date   ALT 18 01/15/2020   AST 10 01/15/2020   ALKPHOS 69 01/15/2020   BILITOT 0.3 01/15/2020   Lab Results  Component Value Date   HGBA1C 5.3 01/15/2020   HGBA1C 5.2 04/23/2019   HGBA1C 5.5 11/30/2018   HGBA1C 5.1 07/17/2018   HGBA1C 5.3 02/16/2018   Lab Results  Component Value Date   INSULIN 12.6 01/15/2020   INSULIN 14.6 04/23/2019   INSULIN 9.0 01/02/2019   INSULIN 15.0 07/17/2018   INSULIN 16.5 02/16/2018   Lab Results  Component Value Date   TSH 1.41 11/30/2018   Lab Results  Component Value Date   CHOL 197 01/15/2020   HDL 53 01/15/2020   LDLCALC 124 (H) 01/15/2020   TRIG 111 01/15/2020   CHOLHDL 3.7 01/15/2020   Lab Results  Component Value Date   WBC 6.7 05/22/2019   HGB 13.0 05/22/2019   HCT 39.8 05/22/2019   MCV 86.4 05/22/2019   PLT 268.0 05/22/2019   Lab Results  Component Value Date   IRON 30 11/02/2017   TIBC 337 11/02/2017   FERRITIN 28 11/02/2017   Attestation Statements:   Reviewed by clinician on day of visit: allergies, medications, problem list, medical history, surgical history, family history, social history, and previous encounter notes.  Time spent on visit including pre-visit chart review and post-visit charting and care was 35 minutes.   IMichaelene Song, am acting as transcriptionist for Abby Potash, PA-C   I have reviewed the above documentation for accuracy and completeness, and I agree with the above. Abby Potash, PA-C

## 2020-03-21 ENCOUNTER — Ambulatory Visit (INDEPENDENT_AMBULATORY_CARE_PROVIDER_SITE_OTHER): Payer: No Typology Code available for payment source | Admitting: Psychology

## 2020-03-21 DIAGNOSIS — F411 Generalized anxiety disorder: Secondary | ICD-10-CM

## 2020-03-24 ENCOUNTER — Ambulatory Visit (INDEPENDENT_AMBULATORY_CARE_PROVIDER_SITE_OTHER): Payer: No Typology Code available for payment source | Admitting: Adult Health

## 2020-03-24 ENCOUNTER — Encounter (INDEPENDENT_AMBULATORY_CARE_PROVIDER_SITE_OTHER): Payer: Self-pay | Admitting: Adult Health

## 2020-03-24 ENCOUNTER — Other Ambulatory Visit: Payer: Self-pay

## 2020-03-24 VITALS — BP 114/74 | HR 69 | Temp 97.9°F | Ht 68.0 in | Wt 312.0 lb

## 2020-03-24 DIAGNOSIS — E282 Polycystic ovarian syndrome: Secondary | ICD-10-CM

## 2020-03-24 DIAGNOSIS — Z6841 Body Mass Index (BMI) 40.0 and over, adult: Secondary | ICD-10-CM | POA: Diagnosis not present

## 2020-03-24 DIAGNOSIS — E7849 Other hyperlipidemia: Secondary | ICD-10-CM | POA: Diagnosis not present

## 2020-03-25 DIAGNOSIS — E7849 Other hyperlipidemia: Secondary | ICD-10-CM | POA: Insufficient documentation

## 2020-03-25 NOTE — Progress Notes (Signed)
Chief Complaint:   OBESITY [name] is here to discuss her progress with her obesity treatment plan along with follow-up of her obesity related diagnoses. [name] is keeping a food journal and adhering to recommended goals of 1800 calories and 115 grams of protein and states she is following her eating plan approximately 80% of the time. [name] states she is doing cardio 30 minutes 1 time per week.  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 She was married 02/24/2020 and honeymooned 02/26/2020 to 03/05/2020 ([place]). She has really enjoyed the flexibility of the journaling plan and has been very consistent with her weight loss!  Subjective:   PCOS (polycystic ovarian syndrome). [name] is on metformin and would eventually like to have children.  Other hyperlipidemia, pure. [name] has hyperlipidemia and has been trying to improve her cholesterol levels with intensive lifestyle modification including a low saturated fat diet, exercise and weight loss. She denies any chest pain, claudication or myalgias. [name] had a full cardiac workup in 2017- results were negative. Her father passed away at the age of 35 from an MI. She denies acute cardiac symptoms and denies tobacco/vape use. She is not on a statin.  Lab Results  Component Value Date   ALT 18 01/15/2020   AST 10 01/15/2020   ALKPHOS 69 01/15/2020   BILITOT 0.3 01/15/2020   Lab Results  Component Value Date   CHOL 197 01/15/2020   HDL 53 01/15/2020   LDLCALC 124 (H) 01/15/2020   TRIG 111 01/15/2020   CHOLHDL 3.7 01/15/2020   Assessment/Plan:   PCOS (polycystic ovarian syndrome). [name] will continue metformin as directed  and healthy eating. Labs will be checked every 3 months.   Other hyperlipidemia, pure. Cardiovascular risk and specific lipid/LDL goals reviewed.  We discussed several lifestyle modifications today and [name] will continue to work on diet, exercise and weight loss efforts. Orders and follow up as documented in patient record. [name] will continue journaling and regular exercise. Labs will be checked every 3 months.   Counseling Intensive lifestyle modifications are the first line treatment for this issue. . Dietary changes: Increase soluble fiber. Decrease simple carbohydrates. . Exercise changes: Moderate to vigorous-intensity aerobic activity 150 minutes per week if tolerated. . Lipid-lowering medications: see documented in medical record.  Class 3 severe obesity with serious comorbidity and body mass index (BMI) of 45.0 to 49.9 in adult, unspecified obesity type ([name]).  [name] is currently in the action stage of change. As such, her goal is to continue with weight loss efforts. She has agreed to keeping a food journal and adhering to recommended goals of 1800 calories and 115 grams of protein daily.   Exercise goals: [name] will continue her current exercise regimen.   Behavioral modification strategies: increasing lean protein intake, decreasing simple carbohydrates, no skipping meals, meal planning and cooking strategies, planning for success and keeping a strict food journal.  [name] has agreed to follow-up with our clinic in 2 weeks. She was informed of the importance of frequent follow-up visits to maximize her success with intensive lifestyle modifications for her multiple health conditions.   Objective:   Blood pressure 114/74, pulse 69, temperature 97.9 F (36.6 C), height 5'  8" (1.727 m), weight (!) 312 lb (141.5 kg), SpO2 98 %. Body mass index is 47.44 kg/m.  General: Cooperative, alert, well developed, in no acute distress. HEENT: Conjunctivae and lids  unremarkable. Cardiovascular: Regular rhythm.  Lungs: Normal work of breathing. Neurologic: No focal deficits.   Lab Results  Component Value Date   CREATININE 0.66 01/15/2020   BUN 15 01/15/2020   NA 142 01/15/2020   K 4.7 01/15/2020   CL 101 01/15/2020   CO2 25 01/15/2020   Lab Results  Component Value Date   ALT 18 01/15/2020   AST 10 01/15/2020   ALKPHOS 69 01/15/2020   BILITOT 0.3 01/15/2020   Lab Results  Component Value Date   HGBA1C 5.3 01/15/2020   HGBA1C 5.2 04/23/2019   HGBA1C 5.5 11/30/2018   HGBA1C 5.1 07/17/2018   HGBA1C 5.3 02/16/2018   Lab Results  Component Value Date   INSULIN 12.6 01/15/2020   INSULIN 14.6 04/23/2019   INSULIN 9.0 01/02/2019   INSULIN 15.0 07/17/2018   INSULIN 16.5 02/16/2018   Lab Results  Component Value Date   TSH 1.41 11/30/2018   Lab Results  Component Value Date   CHOL 197 01/15/2020   HDL 53 01/15/2020   LDLCALC 124 (H) 01/15/2020   TRIG 111 01/15/2020   CHOLHDL 3.7 01/15/2020   Lab Results  Component Value Date   WBC 6.7 05/22/2019   HGB 13.0 05/22/2019   HCT 39.8 05/22/2019   MCV 86.4 05/22/2019   PLT 268.0 05/22/2019   Lab Results  Component Value Date   IRON 30 11/02/2017   TIBC 337 11/02/2017   FERRITIN 28 11/02/2017   Attestation Statements:   Reviewed by clinician on day of visit: allergies, medications, problem list, medical history, surgical history, family history, social history, and previous encounter notes.  Time spent on visit including pre-visit chart review and post-visit charting and care was 27 minutes.   I, Michaelene Song, am acting as Location manager for [employer], NP-C   I have reviewed the above documentation for accuracy and completeness, and I agree with the above. -  Esaw Grandchild, NP

## 2020-04-02 ENCOUNTER — Ambulatory Visit (INDEPENDENT_AMBULATORY_CARE_PROVIDER_SITE_OTHER): Payer: No Typology Code available for payment source | Admitting: Psychology

## 2020-04-02 DIAGNOSIS — F411 Generalized anxiety disorder: Secondary | ICD-10-CM

## 2020-04-07 ENCOUNTER — Ambulatory Visit (INDEPENDENT_AMBULATORY_CARE_PROVIDER_SITE_OTHER): Payer: No Typology Code available for payment source | Admitting: Physician Assistant

## 2020-04-07 ENCOUNTER — Encounter (INDEPENDENT_AMBULATORY_CARE_PROVIDER_SITE_OTHER): Payer: Self-pay | Admitting: Physician Assistant

## 2020-04-07 ENCOUNTER — Other Ambulatory Visit: Payer: Self-pay

## 2020-04-07 VITALS — BP 130/82 | HR 62 | Temp 98.1°F | Ht 68.0 in | Wt 311.0 lb

## 2020-04-07 DIAGNOSIS — Z6841 Body Mass Index (BMI) 40.0 and over, adult: Secondary | ICD-10-CM

## 2020-04-07 DIAGNOSIS — E559 Vitamin D deficiency, unspecified: Secondary | ICD-10-CM | POA: Diagnosis not present

## 2020-04-07 DIAGNOSIS — R7303 Prediabetes: Secondary | ICD-10-CM

## 2020-04-08 NOTE — Progress Notes (Signed)
Chief Complaint:   OBESITY [name] is here to discuss her progress with her obesity treatment plan along with follow-up of her obesity related diagnoses. [name] is keeping a food journal and adhering to recommended goals of 1800 calories and 115 grams of protein and states she is following her eating plan approximately 60% of the time. [name] states she is exercising 0 minutes 0 times per week.  Today's visit was #: 65 Starting weight: 363 lbs Starting date: 11/02/2017 Today's weight: 311 lbs Today's date: 04/07/2020 Total lbs lost to date: 82 Total lbs lost since last in-office visit: 1  Interim History: [name] states that the first week since last visit was good, but last week was very stressed with a family situation. Her appetite has decreased and she is not eating enough throughout the day.  Subjective:   Prediabetes. [name] has a diagnosis of prediabetes based on her elevated HgA1c and was informed this puts her at greater risk of developing diabetes. She continues to work on diet and exercise to decrease her risk of diabetes. She denies nausea or hypoglycemia. [name] is on metformin daily. Appetite has decreased over the last week.  Lab Results  Component Value Date   HGBA1C 5.3 01/15/2020   Lab Results  Component Value Date   INSULIN 12.6 01/15/2020   INSULIN 14.6 04/23/2019   INSULIN 9.0 01/02/2019   INSULIN 15.0 07/17/2018   INSULIN 16.5 02/16/2018   Vitamin D deficiency. [name] is on Vitamin D supplementation, which she is tolerating well. She reports no outside activities this past week.   Ref. Range 01/15/2020 12:28  Vitamin D, 25-Hydroxy Latest Ref Range: 30.0 - 100.0 ng/mL 51.3   Assessment/Plan:   Prediabetes. [name] will continue to work on weight loss, exercise, and decreasing simple carbohydrates to help decrease the risk of diabetes. She will continue her medication as directed.   Vitamin D deficiency. Low Vitamin D level contributes  to fatigue and are associated with obesity, breast, and colon cancer. She agrees to continue to take Vitamin D as directed and will follow-up for routine testing of Vitamin D, at least 2-3 times per year to avoid over-replacement.  Class 3 severe obesity with serious comorbidity and body mass index (BMI) of 45.0 to 49.9 in adult, unspecified obesity type ([place]).  [name] is currently in the action stage of change. As such, her goal is to continue with weight loss efforts. She has agreed to keeping a food journal and adhering to recommended goals of 1800 calories and 115 grams of protein daily.   Exercise goals: For substantial health benefits, adults should do at least 150 minutes (2 hours and 30 minutes) a week of moderate-intensity, or 75 minutes (1 hour and 15 minutes) a week of vigorous-intensity aerobic physical activity, or an equivalent combination of moderate- and vigorous-intensity aerobic activity. Aerobic activity should be performed in episodes of at least 10 minutes, and preferably, it should be spread throughout the week.  Behavioral modification strategies: no skipping meals and meal planning and cooking strategies.  [name] has agreed to follow-up with our clinic in 2 weeks. She was informed of the importance of frequent follow-up visits to maximize her success with intensive lifestyle modifications for her multiple health conditions.   Objective:   Blood pressure 130/82, pulse 62, temperature 98.1 F (36.7 C), temperature source Oral, height 5\' 8"  (1.727 m), weight (!) 311 lb (141.1 kg), SpO2 100 %. Body mass index is 47.29 kg/m.  General: Cooperative, alert, well  developed, in no acute distress. HEENT: Conjunctivae and lids unremarkable. Cardiovascular: Regular rhythm.  Lungs: Normal work of breathing. Neurologic: No focal deficits.   Lab Results  Component Value Date   CREATININE 0.66 01/15/2020   BUN 15 01/15/2020   NA 142 01/15/2020   K 4.7 01/15/2020   CL 101  01/15/2020   CO2 25 01/15/2020   Lab Results  Component Value Date   ALT 18 01/15/2020   AST 10 01/15/2020   ALKPHOS 69 01/15/2020   BILITOT 0.3 01/15/2020   Lab Results  Component Value Date   HGBA1C 5.3 01/15/2020   HGBA1C 5.2 04/23/2019   HGBA1C 5.5 11/30/2018   HGBA1C 5.1 07/17/2018   HGBA1C 5.3 02/16/2018   Lab Results  Component Value Date   INSULIN 12.6 01/15/2020   INSULIN 14.6 04/23/2019   INSULIN 9.0 01/02/2019   INSULIN 15.0 07/17/2018   INSULIN 16.5 02/16/2018   Lab Results  Component Value Date   TSH 1.41 11/30/2018   Lab Results  Component Value Date   CHOL 197 01/15/2020   HDL 53 01/15/2020   LDLCALC 124 (H) 01/15/2020   TRIG 111 01/15/2020   CHOLHDL 3.7 01/15/2020   Lab Results  Component Value Date   WBC 6.7 05/22/2019   HGB 13.0 05/22/2019   HCT 39.8 05/22/2019   MCV 86.4 05/22/2019   PLT 268.0 05/22/2019   Lab Results  Component Value Date   IRON 30 11/02/2017   TIBC 337 11/02/2017   FERRITIN 28 11/02/2017   Attestation Statements:   Reviewed by clinician on day of visit: allergies, medications, problem list, medical history, surgical history, family history, social history, and previous encounter notes.  Time spent on visit including pre-visit chart review and post-visit charting and care was 32 minutes.   IMichaelene Song, am acting as transcriptionist for Abby Potash, PA-C   I have reviewed the above documentation for accuracy and completeness, and I agree with the above. Abby Potash, PA-C

## 2020-04-22 ENCOUNTER — Other Ambulatory Visit: Payer: Self-pay

## 2020-04-22 ENCOUNTER — Encounter (INDEPENDENT_AMBULATORY_CARE_PROVIDER_SITE_OTHER): Payer: Self-pay | Admitting: Physician Assistant

## 2020-04-22 ENCOUNTER — Ambulatory Visit (INDEPENDENT_AMBULATORY_CARE_PROVIDER_SITE_OTHER): Payer: No Typology Code available for payment source | Admitting: Physician Assistant

## 2020-04-22 VITALS — BP 119/74 | HR 63 | Temp 98.5°F | Ht 68.0 in | Wt 313.0 lb

## 2020-04-22 DIAGNOSIS — R7303 Prediabetes: Secondary | ICD-10-CM

## 2020-04-22 DIAGNOSIS — Z6841 Body Mass Index (BMI) 40.0 and over, adult: Secondary | ICD-10-CM

## 2020-04-22 DIAGNOSIS — E7849 Other hyperlipidemia: Secondary | ICD-10-CM | POA: Diagnosis not present

## 2020-04-22 NOTE — Progress Notes (Signed)
Chief Complaint:   OBESITY [name] is here to discuss her progress with her obesity treatment plan along with follow-up of her obesity related diagnoses. [name] is keeping a food journal and adhering to recommended goals of 1800 calories and 115 grams of protein and states she is following her eating plan approximately 60% of the time. [name] states she is exercising 0 minutes 0 times per week.  Today's visit was #: 61 Starting weight: 363 lbs Starting date: 11/02/2017 Today's weight: 313 lbs Today's date: 04/22/2020 Total lbs lost to date: 50 Total lbs lost since last in-office visit: 0  Interim History: [name] reports that due to family stressors she has not been meal planning and prepping. She had not been journaling, but started back 2 days ago. She is getting back to weighing her meat.  Subjective:   Other hyperlipidemia. [name] has hyperlipidemia and has been trying to improve her cholesterol levels with intensive lifestyle modification including a low saturated fat diet, exercise and weight loss. She denies any chest pain. Last LDL was not at goal. Lipids are monitored periodically. Emilija is on no medication.  Lab Results  Component Value Date   ALT 18 01/15/2020   AST 10 01/15/2020   ALKPHOS 69 01/15/2020   BILITOT 0.3 01/15/2020   Lab Results  Component Value Date   CHOL 197 01/15/2020   HDL 53 01/15/2020   LDLCALC 124 (H) 01/15/2020   TRIG 111 01/15/2020   CHOLHDL 3.7 01/15/2020   Prediabetes. [name] has a diagnosis of prediabetes based on her elevated HgA1c and was informed this puts her at greater risk of developing diabetes. She continues to work on diet and exercise to decrease her risk of diabetes. She denies nausea or hypoglycemia. [name] iis on metformin. No nausea, vomiting, or diarrhea.   Lab Results  Component Value Date   HGBA1C 5.3 01/15/2020   Lab Results  Component Value Date   INSULIN 12.6 01/15/2020   INSULIN 14.6 04/23/2019     INSULIN 9.0 01/02/2019   INSULIN 15.0 07/17/2018   INSULIN 16.5 02/16/2018   Assessment/Plan:   Other hyperlipidemia. Cardiovascular risk and specific lipid/LDL goals reviewed.  We discussed several lifestyle modifications today and [name] will continue to work on diet, exercise and weight loss efforts. Orders and follow up as documented in patient record. Labs will be checked at her next visit.  Counseling Intensive lifestyle modifications are the first line treatment for this issue.  Dietary changes: Increase soluble fiber. Decrease simple carbohydrates.  Exercise changes: Moderate to vigorous-intensity aerobic activity 150 minutes per week if tolerated.  Lipid-lowering medications: see documented in medical record.  Prediabetes. [name] will continue to work on weight loss, exercise, and decreasing simple carbohydrates to help decrease the risk of diabetes. She will continue her medication as directed. Labs will be checked at her next visit.  Class 3 severe obesity with serious comorbidity and body mass index (BMI) of 45.0 to 49.9 in adult, unspecified obesity type ([place]).  [name] is currently in the action stage of change. As such, her goal is to continue with weight loss efforts. She has agreed to keeping a food journal and adhering to recommended goals of 1800 calories and 115 grams of protein daily.   Labs will be checked at her next visit.  Exercise goals: For substantial health benefits, adults should do at least 150 minutes (2 hours and 30 minutes) a week of moderate-intensity, or 75 minutes (1 hour and 15 minutes) a week  of vigorous-intensity aerobic physical activity, or an equivalent combination of moderate- and vigorous-intensity aerobic activity. Aerobic activity should be performed in episodes of at least 10 minutes, and preferably, it should be spread throughout the week.  Behavioral modification strategies: avoiding temptations and keeping a strict food  journal.  [name] has agreed to follow-up with our clinic in 2 weeks. She was informed of the importance of frequent follow-up visits to maximize her success with intensive lifestyle modifications for her multiple health conditions.   Objective:   Blood pressure 119/74, pulse 63, temperature 98.5 F (36.9 C), height 5\' 8"  (1.727 m), weight (!) 313 lb (142 kg), SpO2 99 %. Body mass index is 47.59 kg/m.  General: Cooperative, alert, well developed, in no acute distress. HEENT: Conjunctivae and lids unremarkable. Cardiovascular: Regular rhythm.  Lungs: Normal work of breathing. Neurologic: No focal deficits.   Lab Results  Component Value Date   CREATININE 0.66 01/15/2020   BUN 15 01/15/2020   NA 142 01/15/2020   K 4.7 01/15/2020   CL 101 01/15/2020   CO2 25 01/15/2020   Lab Results  Component Value Date   ALT 18 01/15/2020   AST 10 01/15/2020   ALKPHOS 69 01/15/2020   BILITOT 0.3 01/15/2020   Lab Results  Component Value Date   HGBA1C 5.3 01/15/2020   HGBA1C 5.2 04/23/2019   HGBA1C 5.5 11/30/2018   HGBA1C 5.1 07/17/2018   HGBA1C 5.3 02/16/2018   Lab Results  Component Value Date   INSULIN 12.6 01/15/2020   INSULIN 14.6 04/23/2019   INSULIN 9.0 01/02/2019   INSULIN 15.0 07/17/2018   INSULIN 16.5 02/16/2018   Lab Results  Component Value Date   TSH 1.41 11/30/2018   Lab Results  Component Value Date   CHOL 197 01/15/2020   HDL 53 01/15/2020   LDLCALC 124 (H) 01/15/2020   TRIG 111 01/15/2020   CHOLHDL 3.7 01/15/2020   Lab Results  Component Value Date   WBC 6.7 05/22/2019   HGB 13.0 05/22/2019   HCT 39.8 05/22/2019   MCV 86.4 05/22/2019   PLT 268.0 05/22/2019   Lab Results  Component Value Date   IRON 30 11/02/2017   TIBC 337 11/02/2017   FERRITIN 28 11/02/2017   Attestation Statements:   Reviewed by clinician on day of visit: allergies, medications, problem list, medical history, surgical history, family history, social history, and previous  encounter notes.  Time spent on visit including pre-visit chart review and post-visit charting and care was 33 minutes.   IMichaelene Song, am acting as transcriptionist for Abby Potash, PA-C   I have reviewed the above documentation for accuracy and completeness, and I agree with the above. Abby Potash, PA-C

## 2020-05-01 ENCOUNTER — Ambulatory Visit: Payer: No Typology Code available for payment source | Admitting: Psychology

## 2020-05-06 ENCOUNTER — Ambulatory Visit (INDEPENDENT_AMBULATORY_CARE_PROVIDER_SITE_OTHER): Payer: No Typology Code available for payment source | Admitting: Physician Assistant

## 2020-05-06 ENCOUNTER — Encounter (INDEPENDENT_AMBULATORY_CARE_PROVIDER_SITE_OTHER): Payer: Self-pay | Admitting: Physician Assistant

## 2020-05-06 ENCOUNTER — Other Ambulatory Visit: Payer: Self-pay

## 2020-05-06 VITALS — BP 113/77 | HR 67 | Temp 98.2°F | Ht 68.0 in | Wt 313.0 lb

## 2020-05-06 DIAGNOSIS — E7849 Other hyperlipidemia: Secondary | ICD-10-CM

## 2020-05-06 DIAGNOSIS — E559 Vitamin D deficiency, unspecified: Secondary | ICD-10-CM

## 2020-05-06 DIAGNOSIS — Z6841 Body Mass Index (BMI) 40.0 and over, adult: Secondary | ICD-10-CM | POA: Diagnosis not present

## 2020-05-06 NOTE — Progress Notes (Signed)
Chief Complaint:   OBESITY [name] is here to discuss her progress with her obesity treatment plan along with follow-up of her obesity related diagnoses. [name] is on the Category 4 Plan and states she is following her eating plan approximately 75-80% of the time. [name] states she is doing yoga 30 minutes 1-2 times per week.  Today's visit was #: 26 Starting weight: 363 lbs Starting date: 11/02/2017 Today's weight: 313 lbs Today's date: 05/06/2020 Total lbs lost to date: 50 Total lbs lost since last in-office visit: 0  Interim History: [name] reports that she has been struggling with meal prep and planning, especially due to her work schedule. She wants to discuss strategies to help with that today. Holiday eating was also discussed.  Subjective:   Vitamin D deficiency. [name] is on Vitamin D 5,000 units daily, which she is tolerating well. Last level was at goal.   Ref. Range 01/15/2020 12:28  Vitamin D, 25-Hydroxy Latest Ref Range: 30.0 - 100.0 ng/mL 51.3   Other hyperlipidemia. [name] has hyperlipidemia and has been trying to improve her cholesterol levels with intensive lifestyle modification including a low saturated fat diet, exercise and weight loss. She denies any chest pain. [name] is on no medication. She is exercising regularly.  Lab Results  Component Value Date   ALT 18 01/15/2020   AST 10 01/15/2020   ALKPHOS 69 01/15/2020   BILITOT 0.3 01/15/2020   Lab Results  Component Value Date   CHOL 197 01/15/2020   HDL 53 01/15/2020   LDLCALC 124 (H) 01/15/2020   TRIG 111 01/15/2020   CHOLHDL 3.7 01/15/2020   Assessment/Plan:   Vitamin D deficiency. Low Vitamin D level contributes to fatigue and are associated with obesity, breast, and colon cancer. She agrees to continue to take Vitamin D as directed and will follow-up for routine testing of Vitamin D, at least 2-3 times per year to avoid over-replacement.  Other hyperlipidemia. Cardiovascular  risk and specific lipid/LDL goals reviewed.  We discussed several lifestyle modifications today and Terita will continue to work on diet, exercise and weight loss efforts. Orders and follow up as documented in patient record.   Counseling Intensive lifestyle modifications are the first line treatment for this issue. . Dietary changes: Increase soluble fiber. Decrease simple carbohydrates. . Exercise changes: Moderate to vigorous-intensity aerobic activity 150 minutes per week if tolerated. . Lipid-lowering medications: see documented in medical record.  Class 3 severe obesity with serious comorbidity and body mass index (BMI) of 45.0 to 49.9 in adult, unspecified obesity type ([place]).  [name] is currently in the action stage of change. As such, her goal is to continue with weight loss efforts. She has agreed to keeping a food journal and adhering to recommended goals of 1800 calories and 115 grams of protein daily.  Exercise goals: For substantial health benefits, adults should do at least 150 minutes (2 hours and 30 minutes) a week of moderate-intensity, or 75 minutes (1 hour and 15 minutes) a week of vigorous-intensity aerobic physical activity, or an equivalent combination of moderate- and vigorous-intensity aerobic activity. Aerobic activity should be performed in episodes of at least 10 minutes, and preferably, it should be spread throughout the week.  Behavioral modification strategies: meal planning and cooking strategies and holiday eating strategies .  Morris has agreed to follow-up with our clinic in 2-3 weeks. She was informed of the importance of frequent follow-up visits to maximize her success with intensive lifestyle modifications for her multiple health  conditions.   Objective:   Blood pressure 113/77, pulse 67, temperature 98.2 F (36.8 C), height 5\' 8"  (1.727 m), weight (!) 313 lb (142 kg), SpO2 98 %. Body mass index is 47.59 kg/m.  General: Cooperative, alert, well  developed, in no acute distress. HEENT: Conjunctivae and lids unremarkable. Cardiovascular: Regular rhythm.  Lungs: Normal work of breathing. Neurologic: No focal deficits.   Lab Results  Component Value Date   CREATININE 0.66 01/15/2020   BUN 15 01/15/2020   NA 142 01/15/2020   K 4.7 01/15/2020   CL 101 01/15/2020   CO2 25 01/15/2020   Lab Results  Component Value Date   ALT 18 01/15/2020   AST 10 01/15/2020   ALKPHOS 69 01/15/2020   BILITOT 0.3 01/15/2020   Lab Results  Component Value Date   HGBA1C 5.3 01/15/2020   HGBA1C 5.2 04/23/2019   HGBA1C 5.5 11/30/2018   HGBA1C 5.1 07/17/2018   HGBA1C 5.3 02/16/2018   Lab Results  Component Value Date   INSULIN 12.6 01/15/2020   INSULIN 14.6 04/23/2019   INSULIN 9.0 01/02/2019   INSULIN 15.0 07/17/2018   INSULIN 16.5 02/16/2018   Lab Results  Component Value Date   TSH 1.41 11/30/2018   Lab Results  Component Value Date   CHOL 197 01/15/2020   HDL 53 01/15/2020   LDLCALC 124 (H) 01/15/2020   TRIG 111 01/15/2020   CHOLHDL 3.7 01/15/2020   Lab Results  Component Value Date   WBC 6.7 05/22/2019   HGB 13.0 05/22/2019   HCT 39.8 05/22/2019   MCV 86.4 05/22/2019   PLT 268.0 05/22/2019   Lab Results  Component Value Date   IRON 30 11/02/2017   TIBC 337 11/02/2017   FERRITIN 28 11/02/2017   Attestation Statements:   Reviewed by clinician on day of visit: allergies, medications, problem list, medical history, surgical history, family history, social history, and previous encounter notes.  Time spent on visit including pre-visit chart review and post-visit charting and care was 31 minutes.   IMichaelene Song, am acting as transcriptionist for Abby Potash, PA-C   I have reviewed the above documentation for accuracy and completeness, and I agree with the above. Abby Potash, PA-C

## 2020-05-12 ENCOUNTER — Ambulatory Visit: Payer: No Typology Code available for payment source | Admitting: Psychology

## 2020-05-21 ENCOUNTER — Ambulatory Visit (INDEPENDENT_AMBULATORY_CARE_PROVIDER_SITE_OTHER): Payer: No Typology Code available for payment source | Admitting: Psychology

## 2020-05-21 DIAGNOSIS — F411 Generalized anxiety disorder: Secondary | ICD-10-CM

## 2020-05-26 ENCOUNTER — Telehealth: Payer: Self-pay

## 2020-05-26 NOTE — Telephone Encounter (Signed)
Wants to speak to Dr. Raoul Pitch & nurse regarding knee pain and if she get a CT on her right calf.  (I found in her appt notes she is seeing someone tomorrow about knee pain but patient did not mention in our conversation).  Patient can be reached at [phone]

## 2020-05-27 ENCOUNTER — Other Ambulatory Visit: Payer: Self-pay | Admitting: Sports Medicine

## 2020-05-27 ENCOUNTER — Ambulatory Visit: Payer: No Typology Code available for payment source | Admitting: Sports Medicine

## 2020-05-27 ENCOUNTER — Other Ambulatory Visit: Payer: Self-pay

## 2020-05-27 VITALS — BP 140/79 | Ht 69.0 in | Wt 312.0 lb

## 2020-05-27 DIAGNOSIS — M7989 Other specified soft tissue disorders: Secondary | ICD-10-CM | POA: Diagnosis not present

## 2020-05-27 DIAGNOSIS — M25561 Pain in right knee: Secondary | ICD-10-CM | POA: Diagnosis not present

## 2020-05-27 MED ORDER — MELOXICAM 15 MG PO TABS: ORAL_TABLET | ORAL | 0 refills | Status: DC

## 2020-05-27 NOTE — Telephone Encounter (Signed)
Pt states she will follow with Ortho.

## 2020-05-28 ENCOUNTER — Other Ambulatory Visit: Payer: Self-pay

## 2020-05-28 ENCOUNTER — Ambulatory Visit (HOSPITAL_COMMUNITY)
Admission: RE | Admit: 2020-05-28 | Discharge: 2020-05-28 | Disposition: A | Discharge status: Home / Self Care | Payer: No Typology Code available for payment source | Source: Ambulatory Visit | Attending: Sports Medicine | Admitting: Sports Medicine

## 2020-05-28 ENCOUNTER — Encounter: Payer: Self-pay | Admitting: Sports Medicine

## 2020-05-28 DIAGNOSIS — M7989 Other specified soft tissue disorders: Secondary | ICD-10-CM | POA: Diagnosis present

## 2020-05-28 NOTE — Progress Notes (Signed)
   Subjective:    Patient ID: [name], female    DOB: 1986/03/26, 34 y.o.   MRN: [id number]  HPI chief complaint: Right knee pain  [name] comes in complaining of right knee and calf pain that has been present now for several days.  She recently returned from a vacation at the beach.  While on vacation, she began to notice some discomfort in her knee and calf.  It has persisted upon her return to [place].  She has a well-documented history of left knee pain secondary to early DJD due to a previous patellar dislocation.  No recent or remote trauma to the right knee.  In fact, no problems with the right knee in the past.  She has been taking 800 mg of ibuprofen as needed and this has been helpful.  She does describe weakness in the right knee as well.  Interim medical history reviewed Medications reviewed Allergies reviewed    Review of Systems As above    Objective:   Physical Exam  Well-developed, well-nourished.  No acute distress  Right knee: Patient demonstrates nearly full knee range of motion.  No obvious effusion.  Genu valgus with standing.  2+ patellofemoral crepitus.  Knee is stable to ligamentous exam.  No joint line tenderness to palpation.  There is some tenderness to palpation in the right calf and it does appear to be slightly swollen when compared to the left.  Negative Homans.  Neurovascularly intact distally.      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  Although clinical suspicion at this time is low for right lower extremity DVT, I would like to go ahead with a venous Doppler to rule this out.  We will also be able to evaluate for possible ruptured Baker's cyst.  A Doppler is scheduled for tomorrow and results will be phoned directly  to me.  In the meantime, Shenea will go ahead and start Mobic 15 mg daily for 7 days and then as needed.  We will have her start VMO home exercises and she will follow up with me in 4 weeks.  If her Doppler is negative and she continues to have pain at follow-up, consider x-rays of the right knee and formal physical therapy as well as possible cortisone injection.

## 2020-05-28 NOTE — Progress Notes (Signed)
Lower extremity venous has been completed.   Preliminary results in CV Proc.   Abram Sander 05/28/2020 10:17 AM

## 2020-05-29 ENCOUNTER — Ambulatory Visit (INDEPENDENT_AMBULATORY_CARE_PROVIDER_SITE_OTHER): Payer: No Typology Code available for payment source | Admitting: Physician Assistant

## 2020-05-29 ENCOUNTER — Ambulatory Visit: Payer: No Typology Code available for payment source | Admitting: Psychology

## 2020-05-29 ENCOUNTER — Encounter (INDEPENDENT_AMBULATORY_CARE_PROVIDER_SITE_OTHER): Payer: Self-pay | Admitting: Physician Assistant

## 2020-05-29 ENCOUNTER — Telehealth: Payer: Self-pay | Admitting: Sports Medicine

## 2020-05-29 VITALS — BP 121/80 | HR 67 | Temp 98.0°F | Ht 69.0 in | Wt 320.0 lb

## 2020-05-29 DIAGNOSIS — R7303 Prediabetes: Secondary | ICD-10-CM | POA: Diagnosis not present

## 2020-05-29 DIAGNOSIS — Z6841 Body Mass Index (BMI) 40.0 and over, adult: Secondary | ICD-10-CM | POA: Diagnosis not present

## 2020-05-29 DIAGNOSIS — E559 Vitamin D deficiency, unspecified: Secondary | ICD-10-CM | POA: Diagnosis not present

## 2020-05-29 NOTE — Telephone Encounter (Signed)
  [name] was notified via telephone yesterday of her right lower extremity venous Doppler results.  No evidence of DVT but she does have a ruptured Baker's cyst.  Proceed with treatment as described in the office note from December 7 and follow-up with me in 4 weeks.

## 2020-06-02 NOTE — Progress Notes (Signed)
Chief Complaint:   OBESITY [name] is here to discuss her progress with her obesity treatment plan along with follow-up of her obesity related diagnoses. [name] is on keeping a food journal and adhering to recommended goals of 1800 calories and 115 grams of protein and states she is following her eating plan approximately 60% of the time. [name] states she is not exercising regularly due to a knee injury.  Today's visit was #: 76 Starting weight: 363 lbs Starting date: 11/02/2017 Today's weight: 320 lbs Today's date: 05/29/2020 Total lbs lost to date: 43 lbs Total lbs lost since last in-office visit: +7 lbs  Interim History: [name] reports that between Thanksgiving eating and vacation, she has not been following the plan as closely, is not journaling consistently, and has been triggered by sweets.  Subjective:   1. Vitamin D deficiency [name]'s Vitamin D level was 51.3 on 01/15/2020. She is currently taking OTC vitamin D 5,000 IU each day. She denies nausea, vomiting or muscle weakness.  2. Prediabetes [name] is taking metformin 1,000 mg twice daily.  No nausea, vomiting, and diarrhea.  No polyphagia.  She has a diagnosis of prediabetes based on her elevated HgA1c and was informed this puts her at greater risk of developing diabetes. She continues to work on diet and exercise to decrease her risk of diabetes. She denies nausea or hypoglycemia.  Lab Results  Component Value Date   HGBA1C 5.3 01/15/2020   Lab Results  Component Value Date   INSULIN 12.6 01/15/2020   INSULIN 14.6 04/23/2019   INSULIN 9.0 01/02/2019   INSULIN 15.0 07/17/2018   INSULIN 16.5 02/16/2018   Assessment/Plan:   1. Vitamin D deficiency Low Vitamin D level contributes to fatigue and are associated with obesity, breast, and colon cancer. She agrees to continue to take OTC Vitamin D @5 ,000 IU daily and will follow-up for routine testing of Vitamin D, at least 2-3 times per year to avoid  over-replacement.  She is due for labs.  2. Prediabetes [name] will continue to work on weight loss, exercise, and decreasing simple carbohydrates to help decrease the risk of diabetes.  She will continue medication and meal plan.   3. Class 3 severe obesity with serious comorbidity and body mass index (BMI) of 45.0 to 49.9 in adult, unspecified obesity type ([name])  [name] is currently in the action stage of change. As such, her goal is to continue with weight loss efforts. She has agreed to keeping a food journal and adhering to recommended goals of 1800 calories and 115 grams of protein.   Exercise goals: All adults should avoid inactivity. Some physical activity is better than none, and adults who participate in any amount of physical activity gain some health benefits.  Behavioral modification strategies: meal planning and cooking strategies and keeping a strict food journal.  Plan:  IC and labs at next visit.  [name] has agreed to follow-up with our clinic in 4 weeks. She was informed of the importance of frequent follow-up visits to maximize her success with intensive lifestyle modifications for her multiple health conditions.   Objective:   Blood pressure 121/80, pulse 67, temperature 98 F (36.7 C), height 5\' 9"  (1.753 m), weight (!) 320 lb (145.2 kg), last menstrual period 05/20/2020, SpO2 100 %. Body mass index is 47.26 kg/m.  General: Cooperative, alert, well developed, in no acute distress. HEENT: Conjunctivae and lids unremarkable. Cardiovascular: Regular rhythm.  Lungs: Normal work of breathing. Neurologic: No focal deficits.   Lab  Results  Component Value Date   CREATININE 0.66 01/15/2020   BUN 15 01/15/2020   NA 142 01/15/2020   K 4.7 01/15/2020   CL 101 01/15/2020   CO2 25 01/15/2020   Lab Results  Component Value Date   ALT 18 01/15/2020   AST 10 01/15/2020   ALKPHOS 69 01/15/2020   BILITOT 0.3 01/15/2020   Lab Results  Component Value Date   HGBA1C  5.3 01/15/2020   HGBA1C 5.2 04/23/2019   HGBA1C 5.5 11/30/2018   HGBA1C 5.1 07/17/2018   HGBA1C 5.3 02/16/2018   Lab Results  Component Value Date   INSULIN 12.6 01/15/2020   INSULIN 14.6 04/23/2019   INSULIN 9.0 01/02/2019   INSULIN 15.0 07/17/2018   INSULIN 16.5 02/16/2018   Lab Results  Component Value Date   TSH 1.41 11/30/2018   Lab Results  Component Value Date   CHOL 197 01/15/2020   HDL 53 01/15/2020   LDLCALC 124 (H) 01/15/2020   TRIG 111 01/15/2020   CHOLHDL 3.7 01/15/2020   Lab Results  Component Value Date   WBC 6.7 05/22/2019   HGB 13.0 05/22/2019   HCT 39.8 05/22/2019   MCV 86.4 05/22/2019   PLT 268.0 05/22/2019   Lab Results  Component Value Date   IRON 30 11/02/2017   TIBC 337 11/02/2017   FERRITIN 28 11/02/2017   Attestation Statements:   Reviewed by clinician on day of visit: allergies, medications, problem list, medical history, surgical history, family history, social history, and previous encounter notes.  Time spent on visit including pre-visit chart review and post-visit care and charting was 36 minutes.   I, Water quality scientist, CMA, am acting as transcriptionist for Abby Potash, PA-C  I have reviewed the above documentation for accuracy and completeness, and I agree with the above. Abby Potash, PA-C

## 2020-06-25 ENCOUNTER — Other Ambulatory Visit: Payer: Self-pay

## 2020-06-25 DIAGNOSIS — Z20822 Contact with and (suspected) exposure to covid-19: Secondary | ICD-10-CM | POA: Diagnosis not present

## 2020-06-26 ENCOUNTER — Ambulatory Visit (INDEPENDENT_AMBULATORY_CARE_PROVIDER_SITE_OTHER): Payer: No Typology Code available for payment source | Admitting: Physician Assistant

## 2020-06-26 LAB — NOVEL CORONAVIRUS, NAA: SARS-CoV-2, NAA: NOT DETECTED

## 2020-06-26 LAB — SARS-COV-2, NAA 2 DAY TAT

## 2020-07-01 ENCOUNTER — Telehealth (INDEPENDENT_AMBULATORY_CARE_PROVIDER_SITE_OTHER): Payer: Self-pay | Admitting: Physician Assistant

## 2020-07-01 ENCOUNTER — Encounter (INDEPENDENT_AMBULATORY_CARE_PROVIDER_SITE_OTHER): Payer: Self-pay | Admitting: Physician Assistant

## 2020-07-01 DIAGNOSIS — Z6841 Body Mass Index (BMI) 40.0 and over, adult: Secondary | ICD-10-CM

## 2020-07-01 DIAGNOSIS — E7849 Other hyperlipidemia: Secondary | ICD-10-CM

## 2020-07-01 DIAGNOSIS — E559 Vitamin D deficiency, unspecified: Secondary | ICD-10-CM

## 2020-07-02 NOTE — Progress Notes (Signed)
TeleHealth Visit:  Due to the COVID-19 pandemic, this visit was completed with telemedicine (audio/video) technology to reduce patient and provider exposure as well as to preserve personal protective equipment.   Febe has verbally consented to this TeleHealth visit. The patient is located at home, the provider is located at the [facility] office. The participants in this visit include the listed provider and patient. The visit was conducted today via MyChart video.   Chief Complaint: OBESITY Lamiracle is here to discuss her progress with her obesity treatment plan along with follow-up of her obesity related diagnoses. Abaigeal is on keeping a food journal and adhering to recommended goals of 1800 calories and 115 grams of protein daily and states she is following her eating plan approximately 80% of the time. Lynnita states she is doing cardio for 30 minutes 2 times per week.  Today's visit was #: 51 Starting weight: 363 lbs Starting date: 11/02/2017  Interim History: Nirvi is at home today due to [place] exposure. She is fatigued with no other symptoms. She reports that after Christmas she got back on track. Her weight at home today was 316 lbs. She has done a better job being accurate with the tracking of her food.  Subjective:   1. Other hyperlipidemia Denissa's last LDL was not at goal. She is exercising 2 times per week.  2. Vitamin D deficiency Juanetta is on Vit D 5,000 units daily, and she is tolerating it well.  Assessment/Plan:   1. Other hyperlipidemia Cardiovascular risk and specific lipid/LDL goals reviewed. We discussed several lifestyle modifications today and Korah will continue to work on diet, exercise and weight loss efforts. Orders and follow up as documented in patient record.   Counseling Intensive lifestyle modifications are the first line treatment for this issue. . Dietary changes: Increase soluble fiber. Decrease simple  carbohydrates. . Exercise changes: Moderate to vigorous-intensity aerobic activity 150 minutes per week if tolerated. . Lipid-lowering medications: see documented in medical record.  2. Vitamin D deficiency Low Vitamin D level contributes to fatigue and are associated with obesity, breast, and colon cancer. Richele agreed to continue taking OTC Vitamin D 5,000 IU daily and will follow-up for routine testing of Vitamin D, at least 2-3 times per year to avoid over-replacement.  3. Class 3 severe obesity with serious comorbidity and body mass index (BMI) of 45.0 to 49.9 in adult, unspecified obesity type ([place]) Prisilla is currently in the action stage of change. As such, her goal is to continue with weight loss efforts. She has agreed to keeping a food journal and adhering to recommended goals of 1800 calories and 115 grams of protein daily.   Exercise goals: As is.  Behavioral modification strategies: decreasing simple carbohydrates and keeping a strict food journal.  Jazzman has agreed to follow-up with our clinic in 2 weeks. She was informed of the importance of frequent follow-up visits to maximize her success with intensive lifestyle modifications for her multiple health conditions.  Objective:   VITALS: Per patient if applicable, see vitals. GENERAL: Alert and in no acute distress. CARDIOPULMONARY: No increased WOB. Speaking in clear sentences.  PSYCH: Pleasant and cooperative. Speech normal rate and rhythm. Affect is appropriate. Insight and judgement are appropriate. Attention is focused, linear, and appropriate.  NEURO: Oriented as arrived to appointment on time with no prompting.   Lab Results  Component Value Date   CREATININE 0.66 01/15/2020   BUN 15 01/15/2020   NA 142 01/15/2020   K  4.7 01/15/2020   CL 101 01/15/2020   CO2 25 01/15/2020   Lab Results  Component Value Date   ALT 18 01/15/2020   AST 10 01/15/2020   ALKPHOS 69 01/15/2020   BILITOT 0.3 01/15/2020   Lab  Results  Component Value Date   HGBA1C 5.3 01/15/2020   HGBA1C 5.2 04/23/2019   HGBA1C 5.5 11/30/2018   HGBA1C 5.1 07/17/2018   HGBA1C 5.3 02/16/2018   Lab Results  Component Value Date   INSULIN 12.6 01/15/2020   INSULIN 14.6 04/23/2019   INSULIN 9.0 01/02/2019   INSULIN 15.0 07/17/2018   INSULIN 16.5 02/16/2018   Lab Results  Component Value Date   TSH 1.41 11/30/2018   Lab Results  Component Value Date   CHOL 197 01/15/2020   HDL 53 01/15/2020   LDLCALC 124 (H) 01/15/2020   TRIG 111 01/15/2020   CHOLHDL 3.7 01/15/2020   Lab Results  Component Value Date   WBC 6.7 05/22/2019   HGB 13.0 05/22/2019   HCT 39.8 05/22/2019   MCV 86.4 05/22/2019   PLT 268.0 05/22/2019   Lab Results  Component Value Date   IRON 30 11/02/2017   TIBC 337 11/02/2017   FERRITIN 28 11/02/2017    Attestation Statements:   Reviewed by clinician on day of visit: allergies, medications, problem list, medical history, surgical history, family history, social history, and previous encounter notes.   Wilhemena Durie, am acting as transcriptionist for [employer], PA-C.  I have reviewed the above documentation for accuracy and completeness, and I agree with the above. Abby Potash, PA-C

## 2020-07-10 ENCOUNTER — Ambulatory Visit: Payer: 59 | Admitting: Psychology

## 2020-07-11 ENCOUNTER — Ambulatory Visit (INDEPENDENT_AMBULATORY_CARE_PROVIDER_SITE_OTHER): Payer: 59 | Admitting: Psychology

## 2020-07-11 DIAGNOSIS — F411 Generalized anxiety disorder: Secondary | ICD-10-CM | POA: Diagnosis not present

## 2020-07-15 ENCOUNTER — Other Ambulatory Visit: Payer: Self-pay

## 2020-07-15 ENCOUNTER — Encounter (INDEPENDENT_AMBULATORY_CARE_PROVIDER_SITE_OTHER): Payer: Self-pay | Admitting: Physician Assistant

## 2020-07-15 ENCOUNTER — Ambulatory Visit (INDEPENDENT_AMBULATORY_CARE_PROVIDER_SITE_OTHER): Payer: 59 | Admitting: Physician Assistant

## 2020-07-15 VITALS — BP 127/74 | HR 75 | Temp 98.0°F | Ht 69.0 in | Wt 312.0 lb

## 2020-07-15 DIAGNOSIS — E559 Vitamin D deficiency, unspecified: Secondary | ICD-10-CM

## 2020-07-15 DIAGNOSIS — R7303 Prediabetes: Secondary | ICD-10-CM | POA: Diagnosis not present

## 2020-07-15 DIAGNOSIS — E7849 Other hyperlipidemia: Secondary | ICD-10-CM | POA: Diagnosis not present

## 2020-07-15 DIAGNOSIS — Z6841 Body Mass Index (BMI) 40.0 and over, adult: Secondary | ICD-10-CM

## 2020-07-15 DIAGNOSIS — Z9189 Other specified personal risk factors, not elsewhere classified: Secondary | ICD-10-CM | POA: Diagnosis not present

## 2020-07-16 LAB — LIPID PANEL WITH LDL/HDL RATIO
Cholesterol, Total: 164 mg/dL (ref 100–199)
HDL: 50 mg/dL (ref >39)
LDL Chol Calc (NIH): 95 mg/dL (ref 0–99)
LDL/HDL Ratio: 1.9 ratio (ref 0.0–3.2)
Triglycerides: 102 mg/dL (ref 0–149)
VLDL Cholesterol Cal: 19 mg/dL (ref 5–40)

## 2020-07-16 LAB — COMPREHENSIVE METABOLIC PANEL
ALT: 49 IU/L — ABNORMAL HIGH (ref 0–32)
AST: 27 IU/L (ref 0–40)
Albumin/Globulin Ratio: 1.6 (ref 1.2–2.2)
Albumin: 4.1 g/dL (ref 3.8–4.8)
Alkaline Phosphatase: 61 IU/L (ref 44–121)
BUN/Creatinine Ratio: 24 — ABNORMAL HIGH (ref 9–23)
BUN: 16 mg/dL (ref 6–20)
Bilirubin Total: 0.3 mg/dL (ref 0.0–1.2)
CO2: 22 mmol/L (ref 20–29)
Calcium: 9.1 mg/dL (ref 8.7–10.2)
Chloride: 105 mmol/L (ref 96–106)
Creatinine, Ser: 0.68 mg/dL (ref 0.57–1.00)
GFR calc Af Amer: 134 mL/min/{1.73_m2} (ref >59)
GFR calc non Af Amer: 116 mL/min/{1.73_m2} (ref >59)
Globulin, Total: 2.5 g/dL (ref 1.5–4.5)
Glucose: 80 mg/dL (ref 65–99)
Potassium: 4.6 mmol/L (ref 3.5–5.2)
Sodium: 142 mmol/L (ref 134–144)
Total Protein: 6.6 g/dL (ref 6.0–8.5)

## 2020-07-16 LAB — HEMOGLOBIN A1C
Est. average glucose Bld gHb Est-mCnc: 103 mg/dL
Hgb A1c MFr Bld: 5.2 % (ref 4.8–5.6)

## 2020-07-16 LAB — VITAMIN D 25 HYDROXY (VIT D DEFICIENCY, FRACTURES): Vit D, 25-Hydroxy: 49.8 ng/mL (ref 30.0–100.0)

## 2020-07-16 LAB — INSULIN, RANDOM: INSULIN: 5.3 u[IU]/mL (ref 2.6–24.9)

## 2020-07-16 NOTE — Progress Notes (Signed)
Chief Complaint:   OBESITY [name] is here to discuss her progress with her obesity treatment plan along with follow-up of her obesity related diagnoses. [name] is on keeping a food journal and adhering to recommended goals of 1800 calories and 115 grams of protein daily and states she is following her eating plan approximately 90% of the time. [name] states she is doing cardio for 30 minutes 1-2 times per week.  Today's visit was #: 53 Starting weight: 363 lbs Starting date: 11/02/2017 Today's weight: 312 lbs Today's date: 07/15/2020 Total lbs lost to date: 51 Total lbs lost since last in-office visit: 8  Interim History: [name] has been journaling every thing that she has been eating. She is making sure to eat at least 30 grams of protein for breakfast. She is feeling better overall physically. Her step father recently passed away which has been an emotional stressor.  Subjective:   1. Pre-diabetes [name]'s last A1c was 5.3. She is on metformin BID, and she denies nausea, vomiting, or diarrhea. She is due for labs.  2. Other hyperlipidemia [name]'s last LDL was not at goal, and she is not on medications.  3. Vitamin D deficiency [name] is on Vit D 5,000 units daily OTC. Last Vit D level was 51.3.  4. At risk for diabetes mellitus [name] is at higher than average risk for developing diabetes due to obesity.   Assessment/Plan:   1. Pre-diabetes [name] will continue metformin, and will continue to work on weight loss, exercise, and decreasing simple carbohydrates to help decrease the risk of diabetes. We will check labs today.  - Comprehensive metabolic panel - Hemoglobin A1c - Insulin, random  2. Other hyperlipidemia Cardiovascular risk and specific lipid/LDL goals reviewed. We discussed several lifestyle modifications today. [name] will continue her meal plan, exercise and weight loss efforts. We will check labs today. Orders and follow up as documented in  patient record.   Counseling Intensive lifestyle modifications are the first line treatment for this issue. . Dietary changes: Increase soluble fiber. Decrease simple carbohydrates. . Exercise changes: Moderate to vigorous-intensity aerobic activity 150 minutes per week if tolerated. . Lipid-lowering medications: see documented in medical record.  - Lipid Panel With LDL/HDL Ratio  3. Vitamin D deficiency Low Vitamin D level contributes to fatigue and are associated with obesity, breast, and colon cancer. [name] agreed to continue taking OTC Vitamin D 5,000 IU daily, and we will check labs today. She will follow-up for routine testing of Vitamin D, at least 2-3 times per year to avoid over-replacement.  - VITAMIN D 25 Hydroxy (Vit-D Deficiency, Fractures)  4. At risk for diabetes mellitus [name] was given approximately 15 minutes of diabetes education and counseling today. We discussed intensive lifestyle modifications today with an emphasis on weight loss as well as increasing exercise and decreasing simple carbohydrates in her diet. We also reviewed medication options with an emphasis on risk versus benefit of those discussed.   Repetitive spaced learning was employed today to elicit superior memory formation and behavioral change.  5. Class 3 severe obesity with serious comorbidity and body mass index (BMI) of 45.0 to 49.9 in adult, unspecified obesity type ([name]) [name] is currently in the action stage of change. As such, her goal is to continue with weight loss efforts. She has agreed to keeping a food journal and adhering to recommended goals of 1800 calories and 115 grams of protein daily.   Exercise goals: As is.  Behavioral modification strategies: meal planning and  cooking strategies and keeping healthy foods in the home.  [name] has agreed to follow-up with our clinic in 2 weeks. She was informed of the importance of frequent follow-up visits to maximize her success with  intensive lifestyle modifications for her multiple health conditions.   [name] was informed we would discuss her lab results at her next visit unless there is a critical issue that needs to be addressed sooner. [name] agreed to keep her next visit at the agreed upon time to discuss these results.  Objective:   Blood pressure 127/74, pulse 75, temperature 98 F (36.7 C), height 5\' 9"  (1.753 m), weight (!) 312 lb (141.5 kg), SpO2 98 %. Body mass index is 46.07 kg/m.  General: Cooperative, alert, well developed, in no acute distress. HEENT: Conjunctivae and lids unremarkable. Cardiovascular: Regular rhythm.  Lungs: Normal work of breathing. Neurologic: No focal deficits.   Lab Results  Component Value Date   CREATININE 0.66 01/15/2020   BUN 15 01/15/2020   NA 142 01/15/2020   K 4.7 01/15/2020   CL 101 01/15/2020   CO2 25 01/15/2020   Lab Results  Component Value Date   ALT 18 01/15/2020   AST 10 01/15/2020   ALKPHOS 69 01/15/2020   BILITOT 0.3 01/15/2020   Lab Results  Component Value Date   HGBA1C 5.3 01/15/2020   HGBA1C 5.2 04/23/2019   HGBA1C 5.5 11/30/2018   HGBA1C 5.1 07/17/2018   HGBA1C 5.3 02/16/2018   Lab Results  Component Value Date   INSULIN 12.6 01/15/2020   INSULIN 14.6 04/23/2019   INSULIN 9.0 01/02/2019   INSULIN 15.0 07/17/2018   INSULIN 16.5 02/16/2018   Lab Results  Component Value Date   TSH 1.41 11/30/2018   Lab Results  Component Value Date   CHOL 197 01/15/2020   HDL 53 01/15/2020   LDLCALC 124 (H) 01/15/2020   TRIG 111 01/15/2020   CHOLHDL 3.7 01/15/2020   Lab Results  Component Value Date   WBC 6.7 05/22/2019   HGB 13.0 05/22/2019   HCT 39.8 05/22/2019   MCV 86.4 05/22/2019   PLT 268.0 05/22/2019   Lab Results  Component Value Date   IRON 30 11/02/2017   TIBC 337 11/02/2017   FERRITIN 28 11/02/2017   Attestation Statements:   Reviewed by clinician on day of visit: allergies, medications, problem list, medical history,  surgical history, family history, social history, and previous encounter notes.   Wilhemena Durie, am acting as transcriptionist for [employer], PA-C.  I have reviewed the above documentation for accuracy and completeness, and I agree with the above. Abby Potash, PA-C

## 2020-07-29 ENCOUNTER — Ambulatory Visit (INDEPENDENT_AMBULATORY_CARE_PROVIDER_SITE_OTHER): Payer: 59 | Admitting: Adult Health

## 2020-07-29 ENCOUNTER — Other Ambulatory Visit: Payer: Self-pay

## 2020-07-29 ENCOUNTER — Encounter (INDEPENDENT_AMBULATORY_CARE_PROVIDER_SITE_OTHER): Payer: Self-pay | Admitting: Adult Health

## 2020-07-29 VITALS — BP 122/84 | HR 71 | Temp 97.7°F | Ht 69.0 in | Wt 313.0 lb

## 2020-07-29 DIAGNOSIS — E559 Vitamin D deficiency, unspecified: Secondary | ICD-10-CM

## 2020-07-29 DIAGNOSIS — R7401 Elevation of levels of liver transaminase levels: Secondary | ICD-10-CM

## 2020-07-29 DIAGNOSIS — R7303 Prediabetes: Secondary | ICD-10-CM | POA: Diagnosis not present

## 2020-07-29 DIAGNOSIS — Z6841 Body Mass Index (BMI) 40.0 and over, adult: Secondary | ICD-10-CM

## 2020-07-30 ENCOUNTER — Ambulatory Visit (INDEPENDENT_AMBULATORY_CARE_PROVIDER_SITE_OTHER): Payer: 59 | Admitting: Psychology

## 2020-07-30 DIAGNOSIS — F411 Generalized anxiety disorder: Secondary | ICD-10-CM

## 2020-07-30 DIAGNOSIS — R7401 Elevation of levels of liver transaminase levels: Secondary | ICD-10-CM | POA: Insufficient documentation

## 2020-07-30 NOTE — Progress Notes (Signed)
Chief Complaint:   OBESITY [name] is here to discuss her progress with her obesity treatment plan along with follow-up of her obesity related diagnoses. [name] is on keeping a food journal and adhering to recommended goals of 1800 calories and 100 g protein and states she is following her eating plan approximately 70% of the time. [name] states she is doing 0 minutes 0 times per week.  Today's visit was #: 41 Starting weight: 363 lbs Starting date: 11/02/2017 Today's weight: 313 lbs Today's date: 07/29/2020 Total lbs lost to date: 50 lbs Total lbs lost since last in-office visit: 0  Interim History: [name]'s step-father suddenly passed away several weeks ago. 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 She has been unable to meal plan/prep due to her unpredictable schedule the last several weeks.  Interval goal: lose down to under 300 lbs by her birthday September 11, 2018, and increase daily water intake.  Subjective:   1. Pre-diabetes Discussed labs with patient today.  Since changing to Metformin XR from plain formulation, pt's GI upset has resolved. 07/15/2020 A1c at goal 5.2 with improved insulin level 5.3.  Lab Results  Component Value Date   HGBA1C 5.2 07/15/2020   Lab Results  Component Value Date   INSULIN 5.3 07/15/2020   INSULIN 12.6 01/15/2020   INSULIN 14.6 04/23/2019   INSULIN 9.0 01/02/2019   INSULIN 15.0 07/17/2018    2. Transaminitis Worsening. Discussed labs with patient today.  Pt denies left upper quadrant pain. 07/15/2020 CMP resulted ALT 49 and a normal AST.   Lab Results  Component Value Date   ALT 49 (H) 07/15/2020   AST 27 07/15/2020   ALKPHOS 61 07/15/2020   BILITOT 0.3 07/15/2020    3. Vitamin D deficiency Analucia's Vitamin D level was 49.8 on 07/15/2020. She is currently taking OTC vitamin D 5,000 units each day. She denies nausea, vomiting or muscle weakness.   Ref. Range 07/15/2020 14:48  Vitamin  D, 25-Hydroxy Latest Ref Range: 30.0 - 100.0 ng/mL 49.8    Assessment/Plan:   1. Pre-diabetes Truth will continue to work on weight loss, exercise, and decreasing simple carbohydrates to help decrease the risk of diabetes. Continue Metformin and journaling plan.  2. Transaminitis Monitor labs. Avoid hepatotoxic substances. Continue healthy eating.  3. Vitamin D deficiency Low Vitamin D level contributes to fatigue and are associated with obesity, breast, and colon cancer. She agrees to continue to take OTC Vitamin D @5 ,000 IU daily and will follow-up for routine testing of Vitamin D, at least 2-3 times per year to avoid over-replacement.  4. Class 3 severe obesity with serious comorbidity and body mass index (BMI) of 45.0 to 49.9 in adult, unspecified obesity type ([place]) [name] is currently in the action stage of change. As such, her goal is to continue with weight loss efforts. She has agreed to keeping a food journal and adhering to recommended goals of 1800 calories and 115 g protein.   Handout: Eating Out Guide  Exercise goals: As is  Behavioral modification strategies: increasing lean protein intake, increasing water intake, meal planning and cooking strategies and planning for success.  [name] has agreed to follow-up with our clinic in 2 weeks. She was informed of the importance of frequent follow-up visits to maximize her success with intensive lifestyle modifications for her multiple health conditions.   Objective:   Blood pressure 122/84, pulse 71, temperature 97.7 F (36.5 C), height 5\' 9"  (  1.753 m), weight (!) 313 lb (142 kg), SpO2 100 %. Body mass index is 46.22 kg/m.  General: Cooperative, alert, well developed, in no acute distress. HEENT: Conjunctivae and lids unremarkable. Cardiovascular: Regular rhythm.  Lungs: Normal work of breathing. Neurologic: No focal deficits.   Lab Results  Component Value Date   CREATININE 0.68 07/15/2020   BUN 16 07/15/2020   NA  142 07/15/2020   K 4.6 07/15/2020   CL 105 07/15/2020   CO2 22 07/15/2020   Lab Results  Component Value Date   ALT 49 (H) 07/15/2020   AST 27 07/15/2020   ALKPHOS 61 07/15/2020   BILITOT 0.3 07/15/2020   Lab Results  Component Value Date   HGBA1C 5.2 07/15/2020   HGBA1C 5.3 01/15/2020   HGBA1C 5.2 04/23/2019   HGBA1C 5.5 11/30/2018   HGBA1C 5.1 07/17/2018   Lab Results  Component Value Date   INSULIN 5.3 07/15/2020   INSULIN 12.6 01/15/2020   INSULIN 14.6 04/23/2019   INSULIN 9.0 01/02/2019   INSULIN 15.0 07/17/2018   Lab Results  Component Value Date   TSH 1.41 11/30/2018   Lab Results  Component Value Date   CHOL 164 07/15/2020   HDL 50 07/15/2020   LDLCALC 95 07/15/2020   TRIG 102 07/15/2020   CHOLHDL 3.7 01/15/2020   Lab Results  Component Value Date   WBC 6.7 05/22/2019   HGB 13.0 05/22/2019   HCT 39.8 05/22/2019   MCV 86.4 05/22/2019   PLT 268.0 05/22/2019   Lab Results  Component Value Date   IRON 30 11/02/2017   TIBC 337 11/02/2017   FERRITIN 28 11/02/2017    Attestation Statements:   Reviewed by clinician on day of visit: allergies, medications, problem list, medical history, surgical history, family history, social history, and previous encounter notes.  Time spent on visit including pre-visit chart review and post-visit care and charting was 35 minutes.   Coral Ceo, am acting as Location manager for Mina Marble, NP.  I have reviewed the above documentation for accuracy and completeness, and I agree with the above. -  Eustace Hur d. Drystan Reader, NP-C

## 2020-08-12 ENCOUNTER — Ambulatory Visit: Payer: 59 | Admitting: Psychology

## 2020-08-13 ENCOUNTER — Ambulatory Visit: Payer: 59 | Admitting: Psychology

## 2020-08-20 ENCOUNTER — Encounter (INDEPENDENT_AMBULATORY_CARE_PROVIDER_SITE_OTHER): Payer: Self-pay | Admitting: Physician Assistant

## 2020-08-20 ENCOUNTER — Other Ambulatory Visit: Payer: Self-pay

## 2020-08-20 ENCOUNTER — Ambulatory Visit (INDEPENDENT_AMBULATORY_CARE_PROVIDER_SITE_OTHER): Payer: 59 | Admitting: Physician Assistant

## 2020-08-20 VITALS — BP 129/86 | HR 60 | Temp 97.6°F | Ht 69.0 in | Wt 309.0 lb

## 2020-08-20 DIAGNOSIS — E559 Vitamin D deficiency, unspecified: Secondary | ICD-10-CM | POA: Diagnosis not present

## 2020-08-20 DIAGNOSIS — Z6841 Body Mass Index (BMI) 40.0 and over, adult: Secondary | ICD-10-CM | POA: Diagnosis not present

## 2020-08-20 DIAGNOSIS — R7303 Prediabetes: Secondary | ICD-10-CM | POA: Diagnosis not present

## 2020-08-21 NOTE — Progress Notes (Signed)
Chief Complaint:   OBESITY [name] is here to discuss her progress with her obesity treatment plan along with follow-up of her obesity related diagnoses. [name] is on keeping a food journal and adhering to recommended goals of 1800 calories and 115 grams of protein daily and states she is following her eating plan approximately 100% of the time. [name] states she is doing cardio and strengthening for 30 minutes 4 times per week.  Today's visit was #: 17 Starting weight: 363 lbs Starting date: 11/02/2017 Today's weight: 309 lbs Today's date: 08/20/2020 Total lbs lost to date: 26 Total lbs lost since last in-office visit: 4  Interim History: [name] has had multiple life stressors recently, but she has been able to follow the plan well. She continues to workout regularly.  Subjective:   1. Vitamin D deficiency [name] is on 5,000 units daily. Her energy has decreased due to stress.  2. Pre-diabetes [name]'s last A1c was 5.2. She is on metformin. Last insulin level was within normal range. Her hunger is controlled.  Assessment/Plan:   1. Vitamin D deficiency Low Vitamin D level contributes to fatigue and are associated with obesity, breast, and colon cancer. [name] agreed to continue taking OTC Vitamin D 5,000 IU daily and will follow-up for routine testing of Vitamin D, at least 2-3 times per year to avoid over-replacement.  2. Pre-diabetes [name] will continue metformin, and her meal plan, and will continue to work on weight loss, exercise, and decreasing simple carbohydrates to help decrease the risk of diabetes.   3. Class 3 severe obesity with serious comorbidity and body mass index (BMI) of 45.0 to 49.9 in adult, unspecified obesity type ([place]) [name] is currently in the action stage of change. As such, her goal is to continue with weight loss efforts. She has agreed to keeping a food journal and adhering to recommended goals of 1800 calories and 115 grams of protein  daily.   Exercise goals: As is.  Behavioral modification strategies: emotional eating strategies and keeping a strict food journal.  [name] has agreed to follow-up with our clinic in 2 weeks. She was informed of the importance of frequent follow-up visits to maximize her success with intensive lifestyle modifications for her multiple health conditions.   Objective:   Blood pressure 129/86, pulse 60, temperature 97.6 F (36.4 C), height 5\' 9"  (1.753 m), weight (!) 309 lb (140.2 kg), SpO2 99 %. Body mass index is 45.63 kg/m.  General: Cooperative, alert, well developed, in no acute distress. HEENT: Conjunctivae and lids unremarkable. Cardiovascular: Regular rhythm.  Lungs: Normal work of breathing. Neurologic: No focal deficits.   Lab Results  Component Value Date   CREATININE 0.68 07/15/2020   BUN 16 07/15/2020   NA 142 07/15/2020   K 4.6 07/15/2020   CL 105 07/15/2020   CO2 22 07/15/2020   Lab Results  Component Value Date   ALT 49 (H) 07/15/2020   AST 27 07/15/2020   ALKPHOS 61 07/15/2020   BILITOT 0.3 07/15/2020   Lab Results  Component Value Date   HGBA1C 5.2 07/15/2020   HGBA1C 5.3 01/15/2020   HGBA1C 5.2 04/23/2019   HGBA1C 5.5 11/30/2018   HGBA1C 5.1 07/17/2018   Lab Results  Component Value Date   INSULIN 5.3 07/15/2020   INSULIN 12.6 01/15/2020   INSULIN 14.6 04/23/2019   INSULIN 9.0 01/02/2019   INSULIN 15.0 07/17/2018   Lab Results  Component Value Date   TSH 1.41 11/30/2018   Lab Results  Component Value Date   CHOL 164 07/15/2020   HDL 50 07/15/2020   LDLCALC 95 07/15/2020   TRIG 102 07/15/2020   CHOLHDL 3.7 01/15/2020   Lab Results  Component Value Date   WBC 6.7 05/22/2019   HGB 13.0 05/22/2019   HCT 39.8 05/22/2019   MCV 86.4 05/22/2019   PLT 268.0 05/22/2019   Lab Results  Component Value Date   IRON 30 11/02/2017   TIBC 337 11/02/2017   FERRITIN 28 11/02/2017   Attestation Statements:   Reviewed by clinician on day of  visit: allergies, medications, problem list, medical history, surgical history, family history, social history, and previous encounter notes.  Time spent on visit including pre-visit chart review and post-visit care and charting was 45 minutes.    Wilhemena Durie, am acting as transcriptionist for [employer], PA-C.  I have reviewed the above documentation for accuracy and completeness, and I agree with the above. Abby Potash, PA-C

## 2020-09-03 ENCOUNTER — Ambulatory Visit (INDEPENDENT_AMBULATORY_CARE_PROVIDER_SITE_OTHER): Payer: 59 | Admitting: Physician Assistant

## 2020-09-03 ENCOUNTER — Other Ambulatory Visit: Payer: Self-pay

## 2020-09-03 ENCOUNTER — Encounter (INDEPENDENT_AMBULATORY_CARE_PROVIDER_SITE_OTHER): Payer: Self-pay | Admitting: Physician Assistant

## 2020-09-03 VITALS — BP 108/71 | HR 62 | Temp 97.7°F | Ht 69.0 in | Wt 311.0 lb

## 2020-09-03 DIAGNOSIS — Z6841 Body Mass Index (BMI) 40.0 and over, adult: Secondary | ICD-10-CM

## 2020-09-03 DIAGNOSIS — E7849 Other hyperlipidemia: Secondary | ICD-10-CM | POA: Diagnosis not present

## 2020-09-08 NOTE — Progress Notes (Signed)
Chief Complaint:   OBESITY Gurtha is here to discuss her progress with her obesity treatment plan along with follow-up of her obesity related diagnoses. Burna is on keeping a food journal and adhering to recommended goals of 1800 calories and 115 grams of protein daily and states she is following her eating plan approximately 100% of the time. Adalida states she is doing cardio and strengthening for 30 minutes 4 times per week.  Today's visit was #: 40 Starting weight: 363 lbs Starting date: 11/02/2017 Today's weight: 311 lbs Today's date: 09/03/2020 Total lbs lost to date: 52 Total lbs lost since last in-office visit: 0  Interim History: Ivadell is discouraged today slightly because she followed the plan at 100%, but she gained 2 lbs. She journaled 1800 calories instead of 1700 but no other differences.  Subjective:   1. Other hyperlipidemia Sharica's last lipid panel was at goal. She denies chest pain, and she is exercising regularly.  Assessment/Plan:   1. Other hyperlipidemia Cardiovascular risk and specific lipid/LDL goals reviewed. We discussed several lifestyle modifications today. Dao will continue with her meal plan, and will continue to work on diet, exercise and weight loss efforts. Orders and follow up as documented in patient record.   Counseling Intensive lifestyle modifications are the first line treatment for this issue. . Dietary changes: Increase soluble fiber. Decrease simple carbohydrates. . Exercise changes: Moderate to vigorous-intensity aerobic activity 150 minutes per week if tolerated. . Lipid-lowering medications: see documented in medical record.  2. Class 3 severe obesity with serious comorbidity and body mass index (BMI) of 45.0 to 49.9 in adult, unspecified obesity type ([place]) Katriel is currently in the action stage of change. As such, her goal is to continue with weight loss efforts. She has agreed to keeping a food journal and adhering to  recommended goals of 1700-1800 calories and 115 grams of protein daily, and keep carbohydrates at <100 grams daily.   Exercise goals: As is.  Behavioral modification strategies: meal planning and cooking strategies and keeping healthy foods in the home.  Aine has agreed to follow-up with our clinic in 2 weeks. She was informed of the importance of frequent follow-up visits to maximize her success with intensive lifestyle modifications for her multiple health conditions.   Objective:   Blood pressure 108/71, pulse 62, temperature 97.7 F (36.5 C), height 5\' 9"  (1.753 m), weight (!) 311 lb (141.1 kg), SpO2 98 %. Body mass index is 45.93 kg/m.  General: Cooperative, alert, well developed, in no acute distress. HEENT: Conjunctivae and lids unremarkable. Cardiovascular: Regular rhythm.  Lungs: Normal work of breathing. Neurologic: No focal deficits.   Lab Results  Component Value Date   CREATININE 0.68 07/15/2020   BUN 16 07/15/2020   NA 142 07/15/2020   K 4.6 07/15/2020   CL 105 07/15/2020   CO2 22 07/15/2020   Lab Results  Component Value Date   ALT 49 (H) 07/15/2020   AST 27 07/15/2020   ALKPHOS 61 07/15/2020   BILITOT 0.3 07/15/2020   Lab Results  Component Value Date   HGBA1C 5.2 07/15/2020   HGBA1C 5.3 01/15/2020   HGBA1C 5.2 04/23/2019   HGBA1C 5.5 11/30/2018   HGBA1C 5.1 07/17/2018   Lab Results  Component Value Date   INSULIN 5.3 07/15/2020   INSULIN 12.6 01/15/2020   INSULIN 14.6 04/23/2019   INSULIN 9.0 01/02/2019   INSULIN 15.0 07/17/2018   Lab Results  Component Value Date   TSH 1.41 11/30/2018  Lab Results  Component Value Date   CHOL 164 07/15/2020   HDL 50 07/15/2020   LDLCALC 95 07/15/2020   TRIG 102 07/15/2020   CHOLHDL 3.7 01/15/2020   Lab Results  Component Value Date   WBC 6.7 05/22/2019   HGB 13.0 05/22/2019   HCT 39.8 05/22/2019   MCV 86.4 05/22/2019   PLT 268.0 05/22/2019   Lab Results  Component Value Date   IRON 30  11/02/2017   TIBC 337 11/02/2017   FERRITIN 28 11/02/2017   Attestation Statements:   Reviewed by clinician on day of visit: allergies, medications, problem list, medical history, surgical history, family history, social history, and previous encounter notes.  Time spent on visit including pre-visit chart review and post-visit care and charting was 31 minutes.    Wilhemena Durie, am acting as transcriptionist for [employer], PA-C.  I have reviewed the above documentation for accuracy and completeness, and I agree with the above. Abby Potash, PA-C

## 2020-09-11 ENCOUNTER — Ambulatory Visit: Payer: 59 | Admitting: Psychology

## 2020-09-12 ENCOUNTER — Other Ambulatory Visit (HOSPITAL_BASED_OUTPATIENT_CLINIC_OR_DEPARTMENT_OTHER): Payer: Self-pay

## 2020-09-18 ENCOUNTER — Other Ambulatory Visit (HOSPITAL_COMMUNITY): Payer: Self-pay | Admitting: Emergency Medicine

## 2020-09-18 ENCOUNTER — Encounter (INDEPENDENT_AMBULATORY_CARE_PROVIDER_SITE_OTHER): Payer: Self-pay | Admitting: Physician Assistant

## 2020-09-18 ENCOUNTER — Ambulatory Visit (INDEPENDENT_AMBULATORY_CARE_PROVIDER_SITE_OTHER): Payer: 59 | Admitting: Physician Assistant

## 2020-09-18 ENCOUNTER — Other Ambulatory Visit: Payer: Self-pay

## 2020-09-18 VITALS — BP 127/76 | HR 64 | Temp 97.9°F | Ht 69.0 in | Wt 305.0 lb

## 2020-09-18 DIAGNOSIS — Z6841 Body Mass Index (BMI) 40.0 and over, adult: Secondary | ICD-10-CM

## 2020-09-18 DIAGNOSIS — R7303 Prediabetes: Secondary | ICD-10-CM

## 2020-09-23 NOTE — Progress Notes (Signed)
Chief Complaint:   OBESITY [name] is here to discuss her progress with her obesity treatment plan along with follow-up of her obesity related diagnoses. [name] is on keeping a food journal and adhering to recommended goals of 1700-1800 calories and 115 grams of protein daily and states she is following her eating plan approximately 95% of the time. [name] states she is doing cardio for 30 minutes 4 times per week.  Today's visit was #: 64 Starting weight: 363 lbs Starting date: 11/02/2017 Today's weight: 305 lbs Today's date: 09/18/2020 Total lbs lost to date: 71 Total lbs lost since last in-office visit: 6  Interim History: [name] did very well with weight loss. She has also been monitoring her carbohydrates and trying to keep them less than 100, as recommended at her last visit. She is snacking on cheese and she has decreased some of her carbohydrate-rich snacks.  Subjective:   1. Pre-diabetes [name] is on metformin, and she denies polyphagia.  Assessment/Plan:   1. Pre-diabetes [name] will continue her medications, and will continue to work on weight loss, exercise, and decreasing simple carbohydrates to help decrease the risk of diabetes.   2. Class 3 severe obesity with serious comorbidity and body mass index (BMI) of 45.0 to 49.9 in adult, unspecified obesity type ([name]) [name] is currently in the action stage of change. As such, her goal is to continue with weight loss efforts. She has agreed to keeping a food journal and adhering to recommended goals of 1700-1800 calories and 115 grams of protein daily.   Exercise goals: As is.  Behavioral modification strategies: meal planning and cooking strategies and keeping healthy foods in the home.  [name] has agreed to follow-up with our clinic in 3 weeks. She was informed of the importance of frequent follow-up visits to maximize her success with intensive lifestyle modifications for her multiple health conditions.    Objective:   Blood pressure 127/76, pulse 64, temperature 97.9 F (36.6 C), height 5\' 9"  (1.753 m), weight (!) 305 lb (138.3 kg), SpO2 99 %. Body mass index is 45.04 kg/m.  General: Cooperative, alert, well developed, in no acute distress. HEENT: Conjunctivae and lids unremarkable. Cardiovascular: Regular rhythm.  Lungs: Normal work of breathing. Neurologic: No focal deficits.   Lab Results  Component Value Date   CREATININE 0.68 07/15/2020   BUN 16 07/15/2020   NA 142 07/15/2020   K 4.6 07/15/2020   CL 105 07/15/2020   CO2 22 07/15/2020   Lab Results  Component Value Date   ALT 49 (H) 07/15/2020   AST 27 07/15/2020   ALKPHOS 61 07/15/2020   BILITOT 0.3 07/15/2020   Lab Results  Component Value Date   HGBA1C 5.2 07/15/2020   HGBA1C 5.3 01/15/2020   HGBA1C 5.2 04/23/2019   HGBA1C 5.5 11/30/2018   HGBA1C 5.1 07/17/2018   Lab Results  Component Value Date   INSULIN 5.3 07/15/2020   INSULIN 12.6 01/15/2020   INSULIN 14.6 04/23/2019   INSULIN 9.0 01/02/2019   INSULIN 15.0 07/17/2018   Lab Results  Component Value Date   TSH 1.41 11/30/2018   Lab Results  Component Value Date   CHOL 164 07/15/2020   HDL 50 07/15/2020   LDLCALC 95 07/15/2020   TRIG 102 07/15/2020   CHOLHDL 3.7 01/15/2020   Lab Results  Component Value Date   WBC 6.7 05/22/2019   HGB 13.0 05/22/2019   HCT 39.8 05/22/2019   MCV 86.4 05/22/2019   PLT 268.0 05/22/2019  Lab Results  Component Value Date   IRON 30 11/02/2017   TIBC 337 11/02/2017   FERRITIN 28 11/02/2017    Attestation Statements:   Reviewed by clinician on day of visit: allergies, medications, problem list, medical history, surgical history, family history, social history, and previous encounter notes.  Time spent on visit including pre-visit chart review and post-visit care and charting was 31 minutes.    [name], am acting as transcriptionist for [employer], PA-C.  [name]  documentation for accuracy and completeness, and Vanessa agree with the Gaines. -  metacarpal

## 2020-09-24 ENCOUNTER — Ambulatory Visit (INDEPENDENT_AMBULATORY_CARE_PROVIDER_SITE_OTHER): Payer: 59 | Admitting: Psychology

## 2020-09-24 DIAGNOSIS — F411 Generalized anxiety disorder: Secondary | ICD-10-CM

## 2020-09-25 ENCOUNTER — Encounter: Payer: Self-pay | Admitting: Family Medicine

## 2020-09-25 ENCOUNTER — Other Ambulatory Visit: Payer: Self-pay | Admitting: Obstetrics and Gynecology

## 2020-09-25 ENCOUNTER — Other Ambulatory Visit (HOSPITAL_COMMUNITY): Payer: Self-pay

## 2020-09-25 ENCOUNTER — Other Ambulatory Visit: Payer: Self-pay | Admitting: Family Medicine

## 2020-09-25 MED ORDER — METFORMIN HCL ER 500 MG PO TB24
ORAL_TABLET | Freq: Every day | ORAL | 0 refills | Status: DC
Start: 2020-09-25 — End: 2020-10-27
  Filled 2020-09-25: qty 60, 30d supply, fill #0

## 2020-10-01 ENCOUNTER — Ambulatory Visit (HOSPITAL_COMMUNITY)
Admission: RE | Admit: 2020-10-01 | Discharge: 2020-10-01 | Disposition: A | Discharge status: Home / Self Care | Payer: Self-pay | Source: Ambulatory Visit | Attending: Cardiology | Admitting: Cardiology

## 2020-10-01 ENCOUNTER — Other Ambulatory Visit (HOSPITAL_COMMUNITY): Payer: Self-pay

## 2020-10-01 ENCOUNTER — Other Ambulatory Visit: Payer: Self-pay

## 2020-10-01 MED ORDER — MEDROXYPROGESTERONE ACETATE 5 MG PO TABS
ORAL_TABLET | ORAL | 0 refills | Status: DC
Start: 2020-10-01 — End: 2020-10-13
  Filled 2020-10-01: qty 12, 12d supply, fill #0

## 2020-10-01 MED ORDER — PREMARIN 0.625 MG PO TABS
ORAL_TABLET | ORAL | 0 refills | Status: DC
Start: 2020-10-01 — End: 2020-10-13
  Filled 2020-10-01: qty 30, 30d supply, fill #0

## 2020-10-02 ENCOUNTER — Other Ambulatory Visit (HOSPITAL_COMMUNITY): Payer: Self-pay

## 2020-10-02 ENCOUNTER — Other Ambulatory Visit: Payer: Self-pay | Admitting: Obstetrics and Gynecology

## 2020-10-02 MED ORDER — MEDROXYPROGESTERONE ACETATE 5 MG PO TABS
ORAL_TABLET | ORAL | 0 refills | Status: DC
Start: 2020-10-02 — End: 2020-10-13

## 2020-10-02 MED ORDER — PREMARIN 0.625 MG PO TABS
0.6250 mg | ORAL_TABLET | Freq: Every day | ORAL | 0 refills | Status: DC
Start: 2020-10-02 — End: 2020-10-13

## 2020-10-04 ENCOUNTER — Other Ambulatory Visit (HOSPITAL_COMMUNITY): Payer: Self-pay

## 2020-10-07 ENCOUNTER — Other Ambulatory Visit (HOSPITAL_COMMUNITY): Payer: Self-pay

## 2020-10-07 DIAGNOSIS — Z124 Encounter for screening for malignant neoplasm of cervix: Secondary | ICD-10-CM | POA: Diagnosis not present

## 2020-10-07 DIAGNOSIS — E282 Polycystic ovarian syndrome: Secondary | ICD-10-CM | POA: Diagnosis not present

## 2020-10-07 DIAGNOSIS — Z113 Encounter for screening for infections with a predominantly sexual mode of transmission: Secondary | ICD-10-CM | POA: Diagnosis not present

## 2020-10-07 DIAGNOSIS — Z01419 Encounter for gynecological examination (general) (routine) without abnormal findings: Secondary | ICD-10-CM | POA: Diagnosis not present

## 2020-10-07 DIAGNOSIS — Z6841 Body Mass Index (BMI) 40.0 and over, adult: Secondary | ICD-10-CM | POA: Diagnosis not present

## 2020-10-07 DIAGNOSIS — Z01411 Encounter for gynecological examination (general) (routine) with abnormal findings: Secondary | ICD-10-CM | POA: Diagnosis not present

## 2020-10-07 MED ORDER — MEDROXYPROGESTERONE ACETATE 5 MG PO TABS
ORAL_TABLET | ORAL | 3 refills | Status: DC
Start: 2020-10-07 — End: 2021-07-06
  Filled 2020-10-07: qty 36, 36d supply, fill #0
  Filled 2021-01-19: qty 36, 36d supply, fill #1
  Filled 2021-04-13: qty 36, 36d supply, fill #2

## 2020-10-07 MED ORDER — PREMARIN 0.625 MG PO TABS
0.6250 mg | ORAL_TABLET | Freq: Every day | ORAL | 3 refills | Status: DC
Start: 2020-10-07 — End: 2021-07-06
  Filled 2020-10-07: qty 90, 90d supply, fill #0
  Filled 2021-01-19: qty 90, 90d supply, fill #1
  Filled 2021-04-13: qty 90, 90d supply, fill #2

## 2020-10-10 ENCOUNTER — Other Ambulatory Visit (HOSPITAL_COMMUNITY): Payer: Self-pay

## 2020-10-13 ENCOUNTER — Ambulatory Visit (INDEPENDENT_AMBULATORY_CARE_PROVIDER_SITE_OTHER): Payer: 59 | Admitting: Physician Assistant

## 2020-10-13 ENCOUNTER — Other Ambulatory Visit (HOSPITAL_COMMUNITY): Payer: Self-pay

## 2020-10-13 ENCOUNTER — Other Ambulatory Visit: Payer: Self-pay

## 2020-10-13 ENCOUNTER — Encounter (INDEPENDENT_AMBULATORY_CARE_PROVIDER_SITE_OTHER): Payer: Self-pay | Admitting: Physician Assistant

## 2020-10-13 VITALS — BP 130/77 | HR 62 | Temp 98.4°F | Ht 69.0 in | Wt 305.0 lb

## 2020-10-13 DIAGNOSIS — Z6841 Body Mass Index (BMI) 40.0 and over, adult: Secondary | ICD-10-CM

## 2020-10-13 DIAGNOSIS — E559 Vitamin D deficiency, unspecified: Secondary | ICD-10-CM

## 2020-10-14 ENCOUNTER — Ambulatory Visit (INDEPENDENT_AMBULATORY_CARE_PROVIDER_SITE_OTHER): Payer: 59 | Admitting: Psychology

## 2020-10-14 DIAGNOSIS — F411 Generalized anxiety disorder: Secondary | ICD-10-CM

## 2020-10-14 NOTE — Progress Notes (Signed)
Chief Complaint:   OBESITY [name] is here to discuss her progress with her obesity treatment plan along with follow-up of her obesity related diagnoses. [name] is on keeping a food journal and adhering to recommended goals of 1800 calories and 115 grams protein and states she is following her eating plan approximately 75% of the time. [name] states she is strength training and cardio 30 minutes 3 times per week.  Today's visit was #: 37 Starting weight: 363 lbs Starting date: 11/02/2017 Today's weight: 305 lbs Today's date: 10/13/2020 Total lbs lost to date: 58 lbs Total lbs lost since last in-office visit: 0  Interim History: [name] reports that her refrigerator and stove went out for 5 days. She could not meal prep and had to try to make the best choices she could at work.  Subjective:   1. Vitamin D deficiency [name] is on OTC Vit D daily.  Assessment/Plan:   1. Vitamin D deficiency Low Vitamin D level contributes to fatigue and are associated with obesity, breast, and colon cancer. She agrees to continue to take OTC Vitamin D daily and will follow-up for routine testing of Vitamin D, at least 2-3 times per year to avoid over-replacement.  2. Class 3 severe obesity with serious comorbidity and body mass index (BMI) of 45.0 to 49.9 in adult, unspecified obesity type ([place]) [name] is currently in the action stage of change. As such, her goal is to continue with weight loss efforts. She has agreed to keeping a food journal and adhering to recommended goals of 1800 calories and 115 grams protein.   Exercise goals: As is  Behavioral modification strategies: meal planning and cooking strategies and keeping healthy foods in the home.  [name] has agreed to follow-up with our clinic in 2 weeks. She was informed of the importance of frequent follow-up visits to maximize her success with intensive lifestyle modifications for her multiple health conditions.   Objective:   Blood  pressure 130/77, pulse 62, temperature 98.4 F (36.9 C), height 5\' 9"  (1.753 m), weight (!) 305 lb (138.3 kg), SpO2 100 %. Body mass index is 45.04 kg/m.  General: Cooperative, alert, well developed, in no acute distress. HEENT: Conjunctivae and lids unremarkable. Cardiovascular: Regular rhythm.  Lungs: Normal work of breathing. Neurologic: No focal deficits.   Lab Results  Component Value Date   CREATININE 0.68 07/15/2020   BUN 16 07/15/2020   NA 142 07/15/2020   K 4.6 07/15/2020   CL 105 07/15/2020   CO2 22 07/15/2020   Lab Results  Component Value Date   ALT 49 (H) 07/15/2020   AST 27 07/15/2020   ALKPHOS 61 07/15/2020   BILITOT 0.3 07/15/2020   Lab Results  Component Value Date   HGBA1C 5.2 07/15/2020   HGBA1C 5.3 01/15/2020   HGBA1C 5.2 04/23/2019   HGBA1C 5.5 11/30/2018   HGBA1C 5.1 07/17/2018   Lab Results  Component Value Date   INSULIN 5.3 07/15/2020   INSULIN 12.6 01/15/2020   INSULIN 14.6 04/23/2019   INSULIN 9.0 01/02/2019   INSULIN 15.0 07/17/2018   Lab Results  Component Value Date   TSH 1.41 11/30/2018   Lab Results  Component Value Date   CHOL 164 07/15/2020   HDL 50 07/15/2020   LDLCALC 95 07/15/2020   TRIG 102 07/15/2020   CHOLHDL 3.7 01/15/2020   Lab Results  Component Value Date   WBC 6.7 05/22/2019   HGB 13.0 05/22/2019   HCT 39.8 05/22/2019   MCV 86.4  05/22/2019   PLT 268.0 05/22/2019   Lab Results  Component Value Date   IRON 30 11/02/2017   TIBC 337 11/02/2017   FERRITIN 28 11/02/2017    Attestation Statements:   Reviewed by clinician on day of visit: allergies, medications, problem list, medical history, surgical history, family history, social history, and previous encounter notes.  Time spent on visit including pre-visit chart review and post-visit care and charting was 31 minutes.   Coral Ceo, am acting as Location manager for [employer], PA-C.  I have reviewed the above documentation for accuracy  and completeness, and I agree with the above. Abby Potash, PA-C

## 2020-10-15 ENCOUNTER — Other Ambulatory Visit (HOSPITAL_COMMUNITY): Payer: Self-pay

## 2020-10-21 ENCOUNTER — Ambulatory Visit: Payer: 59 | Admitting: Family Medicine

## 2020-10-21 DIAGNOSIS — E282 Polycystic ovarian syndrome: Secondary | ICD-10-CM

## 2020-10-21 DIAGNOSIS — Z8249 Family history of ischemic heart disease and other diseases of the circulatory system: Secondary | ICD-10-CM

## 2020-10-21 DIAGNOSIS — D519 Vitamin B12 deficiency anemia, unspecified: Secondary | ICD-10-CM

## 2020-10-21 DIAGNOSIS — Q103 Other congenital malformations of eyelid: Secondary | ICD-10-CM

## 2020-10-21 DIAGNOSIS — E559 Vitamin D deficiency, unspecified: Secondary | ICD-10-CM

## 2020-10-21 DIAGNOSIS — R7303 Prediabetes: Secondary | ICD-10-CM

## 2020-10-27 ENCOUNTER — Ambulatory Visit: Payer: 59 | Admitting: Family Medicine

## 2020-10-27 ENCOUNTER — Other Ambulatory Visit: Payer: Self-pay

## 2020-10-27 ENCOUNTER — Other Ambulatory Visit (HOSPITAL_COMMUNITY): Payer: Self-pay

## 2020-10-27 ENCOUNTER — Encounter: Payer: Self-pay | Admitting: Family Medicine

## 2020-10-27 VITALS — BP 128/78 | HR 71 | Temp 97.9°F | Ht 69.0 in | Wt 310.8 lb

## 2020-10-27 DIAGNOSIS — E538 Deficiency of other specified B group vitamins: Secondary | ICD-10-CM | POA: Diagnosis not present

## 2020-10-27 DIAGNOSIS — D519 Vitamin B12 deficiency anemia, unspecified: Secondary | ICD-10-CM

## 2020-10-27 DIAGNOSIS — Z6841 Body Mass Index (BMI) 40.0 and over, adult: Secondary | ICD-10-CM

## 2020-10-27 DIAGNOSIS — Z8249 Family history of ischemic heart disease and other diseases of the circulatory system: Secondary | ICD-10-CM

## 2020-10-27 DIAGNOSIS — R7303 Prediabetes: Secondary | ICD-10-CM

## 2020-10-27 DIAGNOSIS — Q103 Other congenital malformations of eyelid: Secondary | ICD-10-CM

## 2020-10-27 DIAGNOSIS — E7849 Other hyperlipidemia: Secondary | ICD-10-CM | POA: Diagnosis not present

## 2020-10-27 DIAGNOSIS — E282 Polycystic ovarian syndrome: Secondary | ICD-10-CM

## 2020-10-27 DIAGNOSIS — E559 Vitamin D deficiency, unspecified: Secondary | ICD-10-CM

## 2020-10-27 LAB — POCT GLYCOSYLATED HEMOGLOBIN (HGB A1C): Hemoglobin A1C: 4.9 % (ref 4.0–5.6)

## 2020-10-27 MED ORDER — METFORMIN HCL ER 500 MG PO TB24
ORAL_TABLET | Freq: Every day | ORAL | 2 refills | Status: DC
Start: 2020-10-27 — End: 2021-07-06
  Filled 2020-10-27: qty 180, 90d supply, fill #0
  Filled 2021-01-19: qty 180, 90d supply, fill #1
  Filled 2021-04-13: qty 180, 90d supply, fill #2

## 2020-10-27 NOTE — Progress Notes (Signed)
Patient ID: [name], female  DOB: 01-01-1986, 35 y.o.   MRN: [id number] Patient Care Team    Relationship Specialty Notifications Start End  Ma Hillock, DO PCP - General Family Medicine  04/25/15   Servando Salina, MD Consulting Physician Obstetrics and Gynecology  06/18/16   Wellington Hampshire, MD Consulting Physician Cardiology  06/18/16   Starlyn Skeans, MD Consulting Physician Family Medicine  05/26/18    Comment: Weight loss clinic    Chief Complaint  Patient presents with  . Prediabetes    Subjective: [name] is a 35 y.o.  Female  present for  prediabetes/morbid obesity/PCOS/BPES She is doing well. Reports compliance  with metformin 1000 mg QD. She is following with gyn for hormone replacement.   She has been dieting and exercising. She is following with Dr. Trixie Rude for her obesity and losing weight steadily.    Depression screen [facility] 2/9 10/27/2020 02/13/2020 05/22/2019 11/30/2018 11/02/2017  Decreased Interest 0 0 0 0 3  Down, Depressed, Hopeless 0 0 0 0 2  PHQ - 2 Score 0 0 0 0 5  Altered sleeping - - - - 2  Tired, decreased energy - - - - 3  Change in appetite - - - - 3  Feeling bad or failure about yourself  - - - - 3  Trouble concentrating - - - - 1  Moving slowly or fidgety/restless - - - - 1  Suicidal thoughts - - - - 0  PHQ-9 Score - - - - 18  Difficult doing work/chores - - - - Somewhat difficult   No flowsheet data found.   Immunization History  Administered Date(s) Administered  . HPV 9-valent 08/01/2014, 09/10/2014, 01/28/2015  . Influenza,inj,Quad PF,6+ Mos 03/21/2014  . Influenza-Unspecified 03/22/2015, 03/15/2017, 04/06/2018  . PFIZER([place] Top)SARS-COV-2 Vaccination 06/18/2019, 07/07/2019, 03/28/2020  . Tdap 08/01/2005, 06/09/2015    Past Medical History:  Diagnosis Date  . Anemia   . Back pain   . BPES syndrome   . Enlarged thyroid   . Infertility, female   . Knee dislocation 2010   Left  . Obesity   .  Palpitations   . PCOS (polycystic ovarian syndrome)   . PONV (postoperative nausea and vomiting)   . Prediabetes   . Premature ovarian failure   . Vitamin B 12 deficiency   . Vitamin D deficiency   . Wears eyeglasses    No Known Allergies Past Surgical History:  Procedure Laterality Date  . EYE SURGERY Bilateral   . NASAL SEPTOPLASTY W/ TURBINOPLASTY Bilateral 03/04/2017   Procedure: NASAL SEPTOPLASTY WITH BILATERAL INFERIOR TURBINATE REDUCTION;  Surgeon: Jerrell Belfast, MD;  Location: [facility];  Service: ENT;  Laterality: Bilateral;  . NASAL SEPTUM SURGERY  03/04/2017  . TONSILLECTOMY AND ADENOIDECTOMY Bilateral 03/04/2017   Procedure: TONSILLECTOMY;  Surgeon: Jerrell Belfast, MD;  Location: [facility] OR;  Service: ENT;  Laterality: Bilateral;   Family History  Problem Relation Age of Onset  . Hyperlipidemia Father   . Hypertension Father   . Heart disease Father   . Sudden death Father        Age 3  . Other Father        BPES  . Heart attack Father   . Other Other        BPES  . Other Other        BPES  . Hyperlipidemia Mother   . Hypertension Mother   . Diabetes Mother   . Anxiety  disorder Mother   . Obesity Mother   . Other Brother        BPES  . Other Sister        BPES  . Other Paternal Uncle        BPES  . Other Cousin        BPES  . Other Cousin        BPES  . Heart attack Maternal Grandmother   . Heart disease Maternal Grandfather   . Heart attack Maternal Grandfather   . Lung cancer Maternal Grandfather   . Heart disease Paternal Grandfather heartr attack  . Heart attack Paternal Grandfather 12       MI  . Sudden death Paternal Grandfather    Social History   Social History Narrative   [name] lives alone. 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    Allergies as of 10/27/2020   No Known Allergies     Medication List       Accurate as of Oct 27, 2020 10:04 AM. If you have any questions, ask your nurse or doctor.         Cholecalciferol 125 MCG (5000 UT) capsule Take 1 capsule (5,000 Units total) by mouth daily. With meal   medroxyPROGESTERone 5 MG tablet Commonly known as: PROVERA TAKE 1 TABLET BY MOUTH ONCE A DAY FOR 12 DAYS   metFORMIN 500 MG 24 hr tablet Commonly known as: GLUCOPHAGE-XR TAKE 2 TABLETS BY MOUTH DAILY.   multivitamin capsule Take 1 capsule by mouth daily.   Premarin 0.625 MG tablet Generic drug: estrogens (conjugated) TAKE 1 TABLET BY MOUTH ONCE DAILY What changed: Another medication with the same name was removed. Continue taking this medication, and follow the directions you see here. Changed by: Howard Pouch, DO       All past medical history, surgical history, allergies, family history, immunizations andmedications were updated in the EMR today and reviewed under the history and medication portions of their EMR.      ROS: 14 pt review of systems performed and negative (unless mentioned in an HPI)  Objective: BP 128/78 (BP Location: Right Arm, Patient Position: Sitting, Cuff Size: Large)   Pulse 71   Temp 97.9 F (36.6 C) (Temporal)   Ht 5\' 9"  (1.753 m)   Wt (!) 310 lb 12.8 oz (141 kg)   SpO2 100%   BMI 45.90 kg/m  Gen: Afebrile. No acute distress. Nontoxic.  HENT: AT. [place]. Eyes:Pupils Equal Round Reactive to light, Extraocular movements intact,  Conjunctiva without redness, discharge or icterus. CV: RRR Chest: CTAB, no wheeze or crackles Skin: no rashes, purpura or petechiae.  Neuro: Normal gait. PERLA. EOMi. Alert. Oriented x3 Psych: Normal affect, dress and demeanor. Normal speech. Normal thought content and judgment.    No exam data present  Assessment/plan: [name] is a 35 y.o. female present for CPE Prediabetes Morbid obesity ([place])- class 3 PCOS (polycystic ovarian syndrome) - Has been stable.  - metformin use mostly for PCOS. Pre-dm has been stable.  - Continued/refilled metformin 1000 mg total QD  - She is seeing weight management  clinic. She has made dietary changes. She has steady and continue weight loss over the last 2 yrs! Doing great! - continue f/u with GYN, for hormone replacement - Routine exercise > 150 min a week encouraged  - reviewed all labs collected last 6 months with other providers.  A1c> 4.9  today - F/U 6  mos for cpe/cmc  BPES syndrome/Vitamin D deficiency DEXA 08/2019 normal completed early for h/o of BPES and Vit. D def.  Vit d normal 07/15/2020- reviewed  FH cardiac diease/HLD - cardiac CT score of zero. 10/01/2020. Reviewed, ordered by cardio.  - Well controlled- LDL 95 07/15/2020  Return in about 30 weeks (around 05/25/2021) for CPE (30 min), [facility] (30 min).  Orders Placed This Encounter  Procedures  . POCT HgB A1C   Meds ordered this encounter  Medications  . metFORMIN (GLUCOPHAGE-XR) 500 MG 24 hr tablet    Sig: TAKE 2 TABLETS BY MOUTH DAILY.    Dispense:  180 tablet    Refill:  2     Electronically signed by: Howard Pouch, DO [place]

## 2020-10-27 NOTE — Patient Instructions (Signed)
Great to see you today.  I have refilled the medication(s) we provide.   If labs were collected, we will inform you of lab results once received either by echart message or telephone call.   - echart message- for normal results that have been seen by the patient already.   - telephone call: abnormal results or if patient has not viewed results in their echart.  

## 2020-10-28 ENCOUNTER — Other Ambulatory Visit: Payer: Self-pay

## 2020-10-28 ENCOUNTER — Ambulatory Visit (INDEPENDENT_AMBULATORY_CARE_PROVIDER_SITE_OTHER): Payer: 59 | Admitting: Physician Assistant

## 2020-10-28 ENCOUNTER — Encounter (INDEPENDENT_AMBULATORY_CARE_PROVIDER_SITE_OTHER): Payer: Self-pay | Admitting: Physician Assistant

## 2020-10-28 VITALS — BP 110/69 | HR 63 | Temp 98.0°F | Ht 69.0 in | Wt 302.0 lb

## 2020-10-28 DIAGNOSIS — E559 Vitamin D deficiency, unspecified: Secondary | ICD-10-CM | POA: Diagnosis not present

## 2020-10-28 DIAGNOSIS — Z6841 Body Mass Index (BMI) 40.0 and over, adult: Secondary | ICD-10-CM

## 2020-10-28 NOTE — Progress Notes (Signed)
Chief Complaint:   OBESITY [name] is here to discuss her progress with her obesity treatment plan along with follow-up of her obesity related diagnoses. [name] is on keeping a food journal and adhering to recommended goals of 1800 calories and 115 grams of protein daily and states she is following her eating plan approximately 90% of the time. [name] states she is doing strengthening and cardio for 30 minutes 2 times per week.  Today's visit was #: 44 Starting weight: 363 lbs Starting date: 11/02/2017 Today's weight: 302 lbs Today's date: 10/28/2020 Total lbs lost to date: 61 Total lbs lost since last in-office visit: 3  Interim History: [name] did well with weight loss. She is exceeding her protein goal, but she did not always hit her calories.  Subjective:   1. Vitamin D deficiency [name] is on OTC Vit D 5,000 units daily.  Assessment/Plan:   1. Vitamin D deficiency Low Vitamin D level contributes to fatigue and are associated with obesity, breast, and colon cancer. [name] agreed to continue taking OTC Vitamin D 5,000 IU daily and will follow-up for routine testing of Vitamin D, at least 2-3 times per year to avoid over-replacement.  2. Class 3 severe obesity with serious comorbidity and body mass index (BMI) of 45.0 to 49.9 in adult, unspecified obesity type ([place]) [name] is currently in the action stage of change. As such, her goal is to continue with weight loss efforts. She has agreed to keeping a food journal and adhering to recommended goals of 1700-1800 calories and 115 grams of protein daily.   Exercise goals: As is.  Behavioral modification strategies: meal planning and cooking strategies and keeping healthy foods in the home.  [name] has agreed to follow-up with our clinic in 2 weeks. She was informed of the importance of frequent follow-up visits to maximize her success with intensive lifestyle modifications for her multiple health conditions.   Objective:    Blood pressure 110/69, pulse 63, temperature 98 F (36.7 C), height 5\' 9"  (1.753 m), weight (!) 302 lb (137 kg), SpO2 99 %. Body mass index is 44.6 kg/m.  General: Cooperative, alert, well developed, in no acute distress. HEENT: Conjunctivae and lids unremarkable. Cardiovascular: Regular rhythm.  Lungs: Normal work of breathing. Neurologic: No focal deficits.   Lab Results  Component Value Date   CREATININE 0.68 07/15/2020   BUN 16 07/15/2020   NA 142 07/15/2020   K 4.6 07/15/2020   CL 105 07/15/2020   CO2 22 07/15/2020   Lab Results  Component Value Date   ALT 49 (H) 07/15/2020   AST 27 07/15/2020   ALKPHOS 61 07/15/2020   BILITOT 0.3 07/15/2020   Lab Results  Component Value Date   HGBA1C 4.9 10/27/2020   HGBA1C 5.2 07/15/2020   HGBA1C 5.3 01/15/2020   HGBA1C 5.2 04/23/2019   HGBA1C 5.5 11/30/2018   Lab Results  Component Value Date   INSULIN 5.3 07/15/2020   INSULIN 12.6 01/15/2020   INSULIN 14.6 04/23/2019   INSULIN 9.0 01/02/2019   INSULIN 15.0 07/17/2018   Lab Results  Component Value Date   TSH 1.41 11/30/2018   Lab Results  Component Value Date   CHOL 164 07/15/2020   HDL 50 07/15/2020   LDLCALC 95 07/15/2020   TRIG 102 07/15/2020   CHOLHDL 3.7 01/15/2020   Lab Results  Component Value Date   WBC 6.7 05/22/2019   HGB 13.0 05/22/2019   HCT 39.8 05/22/2019   MCV 86.4 05/22/2019  PLT 268.0 05/22/2019   Lab Results  Component Value Date   IRON 30 11/02/2017   TIBC 337 11/02/2017   FERRITIN 28 11/02/2017   Attestation Statements:   Reviewed by clinician on day of visit: allergies, medications, problem list, medical history, surgical history, family history, social history, and previous encounter notes.  Time spent on visit including pre-visit chart review and post-visit care and charting was 31 minutes.    Wilhemena Durie, am acting as transcriptionist for [employer], PA-C.  I have reviewed the above documentation for accuracy  and completeness, and I agree with the above. Abby Potash, PA-C

## 2020-11-03 DIAGNOSIS — Q103 Other congenital malformations of eyelid: Secondary | ICD-10-CM | POA: Diagnosis not present

## 2020-11-03 DIAGNOSIS — E2839 Other primary ovarian failure: Secondary | ICD-10-CM | POA: Diagnosis not present

## 2020-11-05 ENCOUNTER — Ambulatory Visit (INDEPENDENT_AMBULATORY_CARE_PROVIDER_SITE_OTHER): Payer: 59 | Admitting: Psychology

## 2020-11-05 DIAGNOSIS — F411 Generalized anxiety disorder: Secondary | ICD-10-CM | POA: Diagnosis not present

## 2020-11-13 ENCOUNTER — Encounter (INDEPENDENT_AMBULATORY_CARE_PROVIDER_SITE_OTHER): Payer: Self-pay | Admitting: Physician Assistant

## 2020-11-13 ENCOUNTER — Ambulatory Visit (INDEPENDENT_AMBULATORY_CARE_PROVIDER_SITE_OTHER): Payer: 59 | Admitting: Physician Assistant

## 2020-11-13 ENCOUNTER — Other Ambulatory Visit: Payer: Self-pay

## 2020-11-13 VITALS — BP 138/85 | HR 61 | Temp 98.3°F | Ht 69.0 in | Wt 300.0 lb

## 2020-11-13 DIAGNOSIS — Z6841 Body Mass Index (BMI) 40.0 and over, adult: Secondary | ICD-10-CM | POA: Diagnosis not present

## 2020-11-13 DIAGNOSIS — R7303 Prediabetes: Secondary | ICD-10-CM

## 2020-11-14 ENCOUNTER — Ambulatory Visit (INDEPENDENT_AMBULATORY_CARE_PROVIDER_SITE_OTHER): Payer: 59 | Admitting: Psychology

## 2020-11-14 DIAGNOSIS — F411 Generalized anxiety disorder: Secondary | ICD-10-CM | POA: Diagnosis not present

## 2020-11-18 NOTE — Progress Notes (Signed)
Chief Complaint:   OBESITY [name] is here to discuss her progress with her obesity treatment plan along with follow-up of her obesity related diagnoses. [name] is on keeping a food journal and adhering to recommended goals of 1700-1800 calories and 115 grams of protein daily and states she is following her eating plan approximately 90-95% of the time. [name] states she is doing cardio and strengthen for 30 minutes 2-3 times per week.  Today's visit was #: 73 Starting weight: 363 lbs Starting date: 11/02/2017 Today's weight: 300 lbs Today's date: 11/13/2020 Total lbs lost to date: 25 Total lbs lost since last in-office visit: 2  Interim History: [name] has been journaling consistently hitting 1800 calories and exceeding her protein goals. Her hunger is controlled. She has been less active. She has also been more stressed.  Subjective:   1. Pre-diabetes [name] is on metformin, and she is tolerating it well. Last A1c was 5.2.  Assessment/Plan:   1. Pre-diabetes [name] will continue with metformin, and will continue to work on weight loss, exercise, and decreasing simple carbohydrates to help decrease the risk of diabetes.   2. Class 3 severe obesity with serious comorbidity and body mass index (BMI) of 45.0 to 49.9 in adult, unspecified obesity type ([place]) [name] is currently in the action stage of change. As such, her goal is to continue with weight loss efforts. She has agreed to keeping a food journal and adhering to recommended goals of 1800 calories and 115 grams of protein daily.   Exercise goals: As is.  Behavioral modification strategies: meal planning and cooking strategies and keeping healthy foods in the home.  [name] has agreed to follow-up with our clinic in 4 weeks. She was informed of the importance of frequent follow-up visits to maximize her success with intensive lifestyle modifications for her multiple health conditions.   Objective:   Blood pressure  138/85, pulse 61, temperature 98.3 F (36.8 C), height 5\' 9"  (1.753 m), weight 300 lb (136.1 kg), SpO2 100 %. Body mass index is 44.3 kg/m.  General: Cooperative, alert, well developed, in no acute distress. HEENT: Conjunctivae and lids unremarkable. Cardiovascular: Regular rhythm.  Lungs: Normal work of breathing. Neurologic: No focal deficits.   Lab Results  Component Value Date   CREATININE 0.68 07/15/2020   BUN 16 07/15/2020   NA 142 07/15/2020   K 4.6 07/15/2020   CL 105 07/15/2020   CO2 22 07/15/2020   Lab Results  Component Value Date   ALT 49 (H) 07/15/2020   AST 27 07/15/2020   ALKPHOS 61 07/15/2020   BILITOT 0.3 07/15/2020   Lab Results  Component Value Date   HGBA1C 4.9 10/27/2020   HGBA1C 5.2 07/15/2020   HGBA1C 5.3 01/15/2020   HGBA1C 5.2 04/23/2019   HGBA1C 5.5 11/30/2018   Lab Results  Component Value Date   INSULIN 5.3 07/15/2020   INSULIN 12.6 01/15/2020   INSULIN 14.6 04/23/2019   INSULIN 9.0 01/02/2019   INSULIN 15.0 07/17/2018   Lab Results  Component Value Date   TSH 1.41 11/30/2018   Lab Results  Component Value Date   CHOL 164 07/15/2020   HDL 50 07/15/2020   LDLCALC 95 07/15/2020   TRIG 102 07/15/2020   CHOLHDL 3.7 01/15/2020   Lab Results  Component Value Date   WBC 6.7 05/22/2019   HGB 13.0 05/22/2019   HCT 39.8 05/22/2019   MCV 86.4 05/22/2019   PLT 268.0 05/22/2019   Lab Results  Component Value Date  IRON 30 11/02/2017   TIBC 337 11/02/2017   FERRITIN 28 11/02/2017   Attestation Statements:   Reviewed by clinician on day of visit: allergies, medications, problem list, medical history, surgical history, family history, social history, and previous encounter notes.  Time spent on visit including pre-visit chart review and post-visit care and charting was 30 minutes.    Wilhemena Durie, am acting as transcriptionist for [employer], PA-C.  I have reviewed the above documentation for accuracy and completeness,  and I agree with the above. Abby Potash, PA-C

## 2020-12-11 ENCOUNTER — Ambulatory Visit (INDEPENDENT_AMBULATORY_CARE_PROVIDER_SITE_OTHER): Payer: 59 | Admitting: Physician Assistant

## 2020-12-11 ENCOUNTER — Other Ambulatory Visit: Payer: Self-pay

## 2020-12-11 VITALS — BP 121/77 | HR 60 | Temp 98.1°F | Ht 69.0 in | Wt 295.0 lb

## 2020-12-11 DIAGNOSIS — E559 Vitamin D deficiency, unspecified: Secondary | ICD-10-CM

## 2020-12-11 DIAGNOSIS — Z6841 Body Mass Index (BMI) 40.0 and over, adult: Secondary | ICD-10-CM | POA: Diagnosis not present

## 2020-12-17 NOTE — Progress Notes (Signed)
Chief Complaint:   OBESITY Aleigh is here to discuss her progress with her obesity treatment plan along with follow-up of her obesity related diagnoses. Yessica is on keeping a food journal and adhering to recommended goals of 1800 calories and 115 grams of protein daily and states she is following her eating plan approximately 95% of the time. Jolene states she is doing cardio and strengthening for 30 minutes 2-3 times per week.  Today's visit was #: 36 Starting weight: 363 lbs Starting date: 11/02/2017 Today's weight: 295 lbs Today's date: 12/11/2020 Total lbs lost to date: 67 Total lbs lost since last in-office visit: 5  Interim History: Feliz did well with weight loss. She is journaling consistently and exceeding her protein goal.  Subjective:   1. Vitamin D deficiency Shauntavia is on 5,000 units Vit D daily. Her energy is good most days.  Lab Results  Component Value Date   VD25OH 49.8 07/15/2020   VD25OH 51.3 01/15/2020   VD25OH 49.2 04/23/2019   Assessment/Plan:   1. Vitamin D deficiency Low Vitamin D level contributes to fatigue and are associated with obesity, breast, and colon cancer. Georgina agreed to continue taking Vitamin D 5,000 IU daily, and we will recheck labs at her next visit. She will follow-up for routine testing of Vitamin D, at least 2-3 times per year to avoid over-replacement.  2. Class 3 severe obesity with serious comorbidity and body mass index (BMI) of 45.0 to 49.9 in adult, unspecified obesity type ([place]) Isobel is currently in the action stage of change. As such, her goal is to continue with weight loss efforts. She has agreed to keeping a food journal and adhering to recommended goals of 1800 calories and 120-130 grams of protein daily.   We will recheck lab at her next visit.  Exercise goals: As is.  Behavioral modification strategies: meal planning and cooking strategies and planning for success.  Jameriah has agreed to follow-up with  our clinic in 4 weeks. She was informed of the importance of frequent follow-up visits to maximize her success with intensive lifestyle modifications for her multiple health conditions.   Objective:   Blood pressure 121/77, pulse 60, temperature 98.1 F (36.7 C), height 5\' 9"  (1.753 m), weight 295 lb (133.8 kg), last menstrual period 11/14/2020, SpO2 100 %. Body mass index is 43.56 kg/m.  General: Cooperative, alert, well developed, in no acute distress. HEENT: Conjunctivae and lids unremarkable. Cardiovascular: Regular rhythm.  Lungs: Normal work of breathing. Neurologic: No focal deficits.   Lab Results  Component Value Date   CREATININE 0.68 07/15/2020   BUN 16 07/15/2020   NA 142 07/15/2020   K 4.6 07/15/2020   CL 105 07/15/2020   CO2 22 07/15/2020   Lab Results  Component Value Date   ALT 49 (H) 07/15/2020   AST 27 07/15/2020   ALKPHOS 61 07/15/2020   BILITOT 0.3 07/15/2020   Lab Results  Component Value Date   HGBA1C 4.9 10/27/2020   HGBA1C 5.2 07/15/2020   HGBA1C 5.3 01/15/2020   HGBA1C 5.2 04/23/2019   HGBA1C 5.5 11/30/2018   Lab Results  Component Value Date   INSULIN 5.3 07/15/2020   INSULIN 12.6 01/15/2020   INSULIN 14.6 04/23/2019   INSULIN 9.0 01/02/2019   INSULIN 15.0 07/17/2018   Lab Results  Component Value Date   TSH 1.41 11/30/2018   Lab Results  Component Value Date   CHOL 164 07/15/2020   HDL 50 07/15/2020   [place] 95 07/15/2020  TRIG 102 07/15/2020   CHOLHDL 3.7 01/15/2020   Lab Results  Component Value Date   WBC 6.7 05/22/2019   HGB 13.0 05/22/2019   HCT 39.8 05/22/2019   MCV 86.4 05/22/2019   PLT 268.0 05/22/2019   Lab Results  Component Value Date   IRON 30 11/02/2017   TIBC 337 11/02/2017   FERRITIN 28 11/02/2017   Attestation Statements:   Reviewed by clinician on day of visit: allergies, medications, problem list, medical history, surgical history, family history, social history, and previous encounter  notes.  Time spent on visit including pre-visit chart review and post-visit care and charting was 30 minutes.    Wilhemena Durie, am acting as transcriptionist for [employer], PA-C.  I have reviewed the above documentation for accuracy and completeness, and I agree with the above. Abby Potash, PA-C

## 2021-01-08 ENCOUNTER — Ambulatory Visit (INDEPENDENT_AMBULATORY_CARE_PROVIDER_SITE_OTHER): Payer: 59 | Admitting: Physician Assistant

## 2021-01-19 ENCOUNTER — Other Ambulatory Visit (HOSPITAL_COMMUNITY): Payer: Self-pay

## 2021-01-20 DIAGNOSIS — Z3141 Encounter for fertility testing: Secondary | ICD-10-CM | POA: Diagnosis not present

## 2021-01-21 ENCOUNTER — Other Ambulatory Visit (HOSPITAL_COMMUNITY): Payer: Self-pay

## 2021-01-22 DIAGNOSIS — H5213 Myopia, bilateral: Secondary | ICD-10-CM | POA: Diagnosis not present

## 2021-02-05 ENCOUNTER — Other Ambulatory Visit: Payer: Self-pay

## 2021-02-05 ENCOUNTER — Ambulatory Visit (INDEPENDENT_AMBULATORY_CARE_PROVIDER_SITE_OTHER): Payer: 59 | Admitting: Family Medicine

## 2021-02-05 ENCOUNTER — Encounter (INDEPENDENT_AMBULATORY_CARE_PROVIDER_SITE_OTHER): Payer: Self-pay | Admitting: Family Medicine

## 2021-02-05 VITALS — BP 125/81 | HR 70 | Temp 97.9°F | Ht 69.0 in | Wt 292.0 lb

## 2021-02-05 DIAGNOSIS — Z6841 Body Mass Index (BMI) 40.0 and over, adult: Secondary | ICD-10-CM | POA: Diagnosis not present

## 2021-02-05 DIAGNOSIS — R7303 Prediabetes: Secondary | ICD-10-CM | POA: Diagnosis not present

## 2021-02-09 NOTE — Progress Notes (Signed)
Chief Complaint:   OBESITY [name] is here to discuss her progress with her obesity treatment plan along with follow-up of her obesity related diagnoses. [name] is on keeping a food journal and adhering to recommended goals of 1800 calories and 120-130 grams protein and states she is following her eating plan approximately 90% of the time. [name] states she is doing cardio and strength training 30 minutes 3 times per week.  Today's visit was #: 28 Starting weight: 363 lbs Starting date: 11/02/2017 Today's weight: 292 lbs Today's date: 02/05/2021 Total lbs lost to date: 71 Total lbs lost since last in-office visit: 3  Interim History: [name] has had 8 weeks since her last appt. Her mom was hospitalized and pt reports some stress. She is still journaling most of the time. Pt has a vacation to [facility] for about a week coming up. 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  Subjective:   1. Pre-diabetes [name]'s last A1c was 4.9 and insulin level 5.3. She is on Metformin.  Assessment/Plan:   1. Pre-diabetes [name] will continue to work on weight loss, exercise, and decreasing simple carbohydrates to help decrease the risk of diabetes. Continue Metformin with no change in dose.  2. Obesity with current BMI of 43.1  [name] is currently in the action stage of change. As such, her goal is to continue with weight loss efforts. She has agreed to keeping a food journal and adhering to recommended goals of 1800 calories and 120-130 grams protein.   Exercise goals:  As is- Pt is to further look into long term activity goal.  Behavioral modification strategies: increasing lean protein intake, meal planning and cooking strategies, keeping healthy foods in the home, and planning for success.  [name] has agreed to follow-up with our clinic in 4 weeks. She was informed of the importance of frequent follow-up visits to maximize her success with intensive  lifestyle modifications for her multiple health conditions.   Objective:   Blood pressure 125/81, pulse 70, temperature 97.9 F (36.6 C), height '5\' 9"'$  (1.753 m), weight 292 lb (132.5 kg), last menstrual period 01/12/2021, SpO2 97 %. Body mass index is 43.12 kg/m.  General: Cooperative, alert, well developed, in no acute distress. HEENT: Conjunctivae and lids unremarkable. Cardiovascular: Regular rhythm.  Lungs: Normal work of breathing. Neurologic: No focal deficits.   Lab Results  Component Value Date   CREATININE 0.68 07/15/2020   BUN 16 07/15/2020   NA 142 07/15/2020   K 4.6 07/15/2020   CL 105 07/15/2020   CO2 22 07/15/2020   Lab Results  Component Value Date   ALT 49 (H) 07/15/2020   AST 27 07/15/2020   ALKPHOS 61 07/15/2020   BILITOT 0.3 07/15/2020   Lab Results  Component Value Date   HGBA1C 4.9 10/27/2020   HGBA1C 5.2 07/15/2020   HGBA1C 5.3 01/15/2020   HGBA1C 5.2 04/23/2019   HGBA1C 5.5 11/30/2018   Lab Results  Component Value Date   INSULIN 5.3 07/15/2020   INSULIN 12.6 01/15/2020   INSULIN 14.6 04/23/2019   INSULIN 9.0 01/02/2019   INSULIN 15.0 07/17/2018   Lab Results  Component Value Date   TSH 1.41 11/30/2018   Lab Results  Component Value Date   CHOL 164 07/15/2020   HDL 50 07/15/2020   LDLCALC 95 07/15/2020   TRIG 102 07/15/2020   CHOLHDL 3.7 01/15/2020   Lab Results  Component Value Date   VD25OH 49.8  07/15/2020   VD25OH 51.3 01/15/2020   VD25OH 49.2 04/23/2019   Lab Results  Component Value Date   WBC 6.7 05/22/2019   HGB 13.0 05/22/2019   HCT 39.8 05/22/2019   MCV 86.4 05/22/2019   PLT 268.0 05/22/2019   Lab Results  Component Value Date   IRON 30 11/02/2017   TIBC 337 11/02/2017   FERRITIN 28 11/02/2017    Attestation Statements:   Reviewed by clinician on day of visit: allergies, medications, problem list, medical history, surgical history, family history, social history, and previous encounter notes.  Time  spent on visit including pre-visit chart review and post-visit care and charting was 12 minutes.   Coral Ceo, CMA, am acting as transcriptionist for Coralie Common, MD.   I have reviewed the above documentation for accuracy and completeness, and I agree with the above. - Coralie Common, MD

## 2021-02-19 DIAGNOSIS — N840 Polyp of corpus uteri: Secondary | ICD-10-CM | POA: Diagnosis not present

## 2021-03-03 ENCOUNTER — Ambulatory Visit (INDEPENDENT_AMBULATORY_CARE_PROVIDER_SITE_OTHER): Payer: 59 | Admitting: Adult Health

## 2021-03-03 ENCOUNTER — Encounter (INDEPENDENT_AMBULATORY_CARE_PROVIDER_SITE_OTHER): Payer: Self-pay | Admitting: Adult Health

## 2021-03-03 ENCOUNTER — Other Ambulatory Visit: Payer: Self-pay

## 2021-03-03 VITALS — BP 119/72 | HR 60 | Temp 97.9°F | Ht 69.0 in | Wt 294.0 lb

## 2021-03-03 DIAGNOSIS — R7303 Prediabetes: Secondary | ICD-10-CM

## 2021-03-03 DIAGNOSIS — Z6841 Body Mass Index (BMI) 40.0 and over, adult: Secondary | ICD-10-CM | POA: Diagnosis not present

## 2021-03-03 NOTE — Progress Notes (Signed)
Chief Complaint:   OBESITY [name] is here to discuss her progress with her obesity treatment plan along with follow-up of her obesity related diagnoses. [name] is on keeping a food journal and adhering to recommended goals of 1800 calories and 120-130 grams of protein and states she is following her eating plan approximately 75% of the time. [name] states she is doing cardio and strength training for 30 minutes 3 times per week.  Today's visit was #: 22 Starting weight: 363 lbs Starting date: 11/02/2017 Today's weight: 294 lbs Today's date: 03/03/2021 Total lbs lost to date: 69 lbs Total lbs lost since last in-office visit: 0  Interim History: [name] recently traveled to [facility] - happy to have essentially maintained her weight since last OV. She meal planned/prepped while at the [facility] to stay true to her journaling program.  She also endorses frequent walking and swimming while a the resort.  Of note:  She has been on Saxenda in the past - caused palpitations and absent appetite.  Subjective:   1. Prediabetes She is on metformin XR 500 mg - 2 tablets daily - managed by PCP. On 10/27/2020, A1c was 4.9.  Lab Results  Component Value Date   HGBA1C 4.9 10/27/2020   Lab Results  Component Value Date   INSULIN 5.3 07/15/2020   INSULIN 12.6 01/15/2020   INSULIN 14.6 04/23/2019   INSULIN 9.0 01/02/2019   INSULIN 15.0 07/17/2018   Assessment/Plan:   1. Prediabetes Monitor for symptoms of hypoglycemia.  Eat every few hours.  2. Obesity with current BMI of 43.4  [name] is currently in the action stage of change. As such, her goal is to continue with weight loss efforts. She has agreed to keeping a food journal and adhering to recommended goals of 1800 calories and 120-130 grams of protein.   Exercise goals:  As is.  Behavioral modification strategies: increasing lean protein intake, decreasing simple carbohydrates, no skipping meals, meal planning and cooking  strategies, planning for success, and keeping a strict food journal.  Handout:  High Protein/Low Calorie Foods.  100 grams of carbohydrate per day.  [name] has agreed to follow-up with our clinic in 2 weeks, fasting. She was informed of the importance of frequent follow-up visits to maximize her success with intensive lifestyle modifications for her multiple health conditions.   Objective:   Blood pressure 119/72, pulse 60, temperature 97.9 F (36.6 C), height '5\' 9"'$  (1.753 m), weight 294 lb (133.4 kg), SpO2 97 %. Body mass index is 43.42 kg/m.  General: Cooperative, alert, well developed, in no acute distress. HEENT: Conjunctivae and lids unremarkable. Cardiovascular: Regular rhythm.  Lungs: Normal work of breathing. Neurologic: No focal deficits.   Lab Results  Component Value Date   CREATININE 0.68 07/15/2020   BUN 16 07/15/2020   NA 142 07/15/2020   K 4.6 07/15/2020   CL 105 07/15/2020   CO2 22 07/15/2020   Lab Results  Component Value Date   ALT 49 (H) 07/15/2020   AST 27 07/15/2020   ALKPHOS 61 07/15/2020   BILITOT 0.3 07/15/2020   Lab Results  Component Value Date   HGBA1C 4.9 10/27/2020   HGBA1C 5.2 07/15/2020   HGBA1C 5.3 01/15/2020   HGBA1C 5.2 04/23/2019   HGBA1C 5.5 11/30/2018   Lab Results  Component Value Date   INSULIN 5.3 07/15/2020   INSULIN 12.6 01/15/2020   INSULIN 14.6 04/23/2019   INSULIN 9.0 01/02/2019   INSULIN 15.0 07/17/2018   Lab Results  Component Value Date   TSH 1.41 11/30/2018   Lab Results  Component Value Date   CHOL 164 07/15/2020   HDL 50 07/15/2020   LDLCALC 95 07/15/2020   TRIG 102 07/15/2020   CHOLHDL 3.7 01/15/2020   Lab Results  Component Value Date   VD25OH 49.8 07/15/2020   VD25OH 51.3 01/15/2020   VD25OH 49.2 04/23/2019   Lab Results  Component Value Date   WBC 6.7 05/22/2019   HGB 13.0 05/22/2019   HCT 39.8 05/22/2019   MCV 86.4 05/22/2019   PLT 268.0 05/22/2019   Lab Results  Component Value  Date   IRON 30 11/02/2017   TIBC 337 11/02/2017   FERRITIN 28 11/02/2017   Attestation Statements:   Reviewed by clinician on day of visit: allergies, medications, problem list, medical history, surgical history, family history, social history, and previous encounter notes.  I, Water quality scientist, CMA, am acting as Location manager for Mina Marble, NP.  I have reviewed the above documentation for accuracy and completeness, and I agree with the above. -  Tekela Garguilo d. Joaovictor Krone, NP-C

## 2021-03-04 ENCOUNTER — Encounter: Payer: Self-pay | Admitting: Family Medicine

## 2021-03-04 DIAGNOSIS — E2839 Other primary ovarian failure: Secondary | ICD-10-CM | POA: Diagnosis not present

## 2021-03-05 DIAGNOSIS — E2839 Other primary ovarian failure: Secondary | ICD-10-CM | POA: Diagnosis not present

## 2021-03-16 DIAGNOSIS — Z113 Encounter for screening for infections with a predominantly sexual mode of transmission: Secondary | ICD-10-CM | POA: Diagnosis not present

## 2021-03-16 DIAGNOSIS — Z0183 Encounter for blood typing: Secondary | ICD-10-CM | POA: Diagnosis not present

## 2021-03-17 ENCOUNTER — Ambulatory Visit (INDEPENDENT_AMBULATORY_CARE_PROVIDER_SITE_OTHER): Payer: 59 | Admitting: Physician Assistant

## 2021-03-17 ENCOUNTER — Other Ambulatory Visit: Payer: Self-pay

## 2021-03-17 ENCOUNTER — Encounter (INDEPENDENT_AMBULATORY_CARE_PROVIDER_SITE_OTHER): Payer: Self-pay | Admitting: Physician Assistant

## 2021-03-17 VITALS — BP 111/73 | HR 64 | Temp 97.8°F | Ht 69.0 in | Wt 288.0 lb

## 2021-03-17 DIAGNOSIS — Z6841 Body Mass Index (BMI) 40.0 and over, adult: Secondary | ICD-10-CM | POA: Diagnosis not present

## 2021-03-17 DIAGNOSIS — E7849 Other hyperlipidemia: Secondary | ICD-10-CM | POA: Diagnosis not present

## 2021-03-17 DIAGNOSIS — Z9189 Other specified personal risk factors, not elsewhere classified: Secondary | ICD-10-CM

## 2021-03-17 DIAGNOSIS — R7303 Prediabetes: Secondary | ICD-10-CM

## 2021-03-17 DIAGNOSIS — E559 Vitamin D deficiency, unspecified: Secondary | ICD-10-CM

## 2021-03-17 NOTE — Progress Notes (Signed)
Chief Complaint:   OBESITY [name] is here to discuss her progress with her obesity treatment plan along with follow-up of her obesity related diagnoses. [name] is on keeping a food journal and adhering to recommended goals of 1800 calories and 120-130 grams of protein and states she is following her eating plan approximately 95% of the time. 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  Today's visit was #: 34 Starting weight: 363 lbs Starting date: 11/02/2017 Today's weight: 288 lbs Today's date: 03/17/2021 Total lbs lost to date: 75 lbs Total lbs lost since last in-office visit: 6 lbs  Interim History: [name] continues to do well with weight loss.  She is being creative with her meals and is feeling good overall.  No issues reported today.  Subjective:   1. Prediabetes [name] is on metformin.  Tolerating well.  2. Vitamin D deficiency She is on OTC vitamin D.  No issues.  3. Other hyperlipidemia No medication.  Exercising regularly.  4. At risk for diabetes mellitus [name] is at higher than average risk for developing diabetes due to obesity.    Assessment/Plan:   1. Prediabetes Continue with medications and weight loss.  Check labs today.  [name] will continue to work on weight loss, exercise, and decreasing simple carbohydrates to help decrease the risk of diabetes.   - Comprehensive metabolic panel - Hemoglobin A1c - Insulin, random  2. Vitamin D deficiency Check labs today.  Low Vitamin D level contributes to fatigue and are associated with obesity, breast, and colon cancer. She agrees to continue to take OTC vitamin D daily.   - VITAMIN D 25 Hydroxy (Vit-D Deficiency, Fractures)  3. Other hyperlipidemia Continue with plan and exercise.  Check labs today.  Cardiovascular risk and specific lipid/LDL goals reviewed.  We discussed several lifestyle modifications today and [name] will continue to work on diet,  exercise and weight loss efforts. Orders and follow up as documented in patient record.   Counseling Intensive lifestyle modifications are the first line treatment for this issue. Dietary changes: Increase soluble fiber. Decrease simple carbohydrates. Exercise changes: Moderate to vigorous-intensity aerobic activity 150 minutes per week if tolerated. Lipid-lowering medications: see documented in medical record.  - Lipid panel  4. At risk for diabetes mellitus [name] was given approximately 15 minutes of diabetes education and counseling today. We discussed intensive lifestyle modifications today with an emphasis on weight loss as well as increasing exercise and decreasing simple carbohydrates in her diet. We also reviewed medication options with an emphasis on risk versus benefit of those discussed.   Repetitive spaced learning was employed today to elicit superior memory formation and behavioral change.   5. Obesity with current BMI of 42.51  [name] is currently in the action stage of change. As such, her goal is to continue with weight loss efforts. She has agreed to keeping a food journal and adhering to recommended goals of 1800 calories and 120-130 grams of protein.   Exercise goals:  As is.  Behavioral modification strategies: meal planning and cooking strategies and planning for success.  [name] has agreed to follow-up with our clinic in 4 weeks. She was informed of the importance of frequent follow-up visits to maximize her success with intensive lifestyle modifications for her multiple health conditions.   [name] was informed we would discuss her lab results at her next visit unless there is a critical issue that needs to be addressed sooner. [name] agreed to keep  her next visit at the agreed upon time to discuss these results.  Objective:   Blood pressure 111/73, pulse 64, temperature 97.8 F (36.6 C), height 5\' 9"  (1.753 m), weight 288 lb (130.6 kg), SpO2 100 %. Body  mass index is 42.53 kg/m.  General: Cooperative, alert, well developed, in no acute distress. HEENT: Conjunctivae and lids unremarkable. Cardiovascular: Regular rhythm.  Lungs: Normal work of breathing. Neurologic: No focal deficits.   Lab Results  Component Value Date   CREATININE 0.68 07/15/2020   BUN 16 07/15/2020   NA 142 07/15/2020   K 4.6 07/15/2020   CL 105 07/15/2020   CO2 22 07/15/2020   Lab Results  Component Value Date   ALT 49 (H) 07/15/2020   AST 27 07/15/2020   ALKPHOS 61 07/15/2020   BILITOT 0.3 07/15/2020   Lab Results  Component Value Date   HGBA1C 4.9 10/27/2020   HGBA1C 5.2 07/15/2020   HGBA1C 5.3 01/15/2020   HGBA1C 5.2 04/23/2019   HGBA1C 5.5 11/30/2018   Lab Results  Component Value Date   INSULIN 5.3 07/15/2020   INSULIN 12.6 01/15/2020   INSULIN 14.6 04/23/2019   INSULIN 9.0 01/02/2019   INSULIN 15.0 07/17/2018   Lab Results  Component Value Date   TSH 1.41 11/30/2018   Lab Results  Component Value Date   CHOL 164 07/15/2020   HDL 50 07/15/2020   LDLCALC 95 07/15/2020   TRIG 102 07/15/2020   CHOLHDL 3.7 01/15/2020   Lab Results  Component Value Date   VD25OH 49.8 07/15/2020   VD25OH 51.3 01/15/2020   VD25OH 49.2 04/23/2019   Lab Results  Component Value Date   WBC 6.7 05/22/2019   HGB 13.0 05/22/2019   HCT 39.8 05/22/2019   MCV 86.4 05/22/2019   PLT 268.0 05/22/2019   Lab Results  Component Value Date   IRON 30 11/02/2017   TIBC 337 11/02/2017   FERRITIN 28 11/02/2017   Attestation Statements:   Reviewed by clinician on day of visit: allergies, medications, problem list, medical history, surgical history, family history, social history, and previous encounter notes.  I, Water quality scientist, CMA, am acting as transcriptionist for Abby Potash, PA-C  I have reviewed the above documentation for accuracy and completeness, and I agree with the above. Abby Potash, PA-C

## 2021-03-18 LAB — COMPREHENSIVE METABOLIC PANEL
ALT: 20 IU/L (ref 0–32)
AST: 13 IU/L (ref 0–40)
Albumin/Globulin Ratio: 1.6 (ref 1.2–2.2)
Albumin: 4.4 g/dL (ref 3.8–4.8)
Alkaline Phosphatase: 59 IU/L (ref 44–121)
BUN/Creatinine Ratio: 27 — ABNORMAL HIGH (ref 9–23)
BUN: 19 mg/dL (ref 6–20)
Bilirubin Total: 0.3 mg/dL (ref 0.0–1.2)
CO2: 23 mmol/L (ref 20–29)
Calcium: 9.5 mg/dL (ref 8.7–10.2)
Chloride: 104 mmol/L (ref 96–106)
Creatinine, Ser: 0.7 mg/dL (ref 0.57–1.00)
Globulin, Total: 2.7 g/dL (ref 1.5–4.5)
Glucose: 81 mg/dL (ref 70–99)
Potassium: 4.9 mmol/L (ref 3.5–5.2)
Sodium: 140 mmol/L (ref 134–144)
Total Protein: 7.1 g/dL (ref 6.0–8.5)
eGFR: 117 mL/min/{1.73_m2} (ref >59)

## 2021-03-18 LAB — LIPID PANEL
Chol/HDL Ratio: 3.6 ratio (ref 0.0–4.4)
Cholesterol, Total: 208 mg/dL — ABNORMAL HIGH (ref 100–199)
HDL: 58 mg/dL (ref >39)
LDL Chol Calc (NIH): 130 mg/dL — ABNORMAL HIGH (ref 0–99)
Triglycerides: 111 mg/dL (ref 0–149)
VLDL Cholesterol Cal: 20 mg/dL (ref 5–40)

## 2021-03-18 LAB — INSULIN, RANDOM: INSULIN: 11.1 u[IU]/mL (ref 2.6–24.9)

## 2021-03-18 LAB — HEMOGLOBIN A1C
Est. average glucose Bld gHb Est-mCnc: 105 mg/dL
Hgb A1c MFr Bld: 5.3 % (ref 4.8–5.6)

## 2021-03-18 LAB — VITAMIN D 25 HYDROXY (VIT D DEFICIENCY, FRACTURES): Vit D, 25-Hydroxy: 76.6 ng/mL (ref 30.0–100.0)

## 2021-03-23 ENCOUNTER — Encounter: Payer: Self-pay | Admitting: Family Medicine

## 2021-03-23 ENCOUNTER — Telehealth (INDEPENDENT_AMBULATORY_CARE_PROVIDER_SITE_OTHER): Payer: 59 | Admitting: Family Medicine

## 2021-03-23 DIAGNOSIS — L239 Allergic contact dermatitis, unspecified cause: Secondary | ICD-10-CM

## 2021-03-23 DIAGNOSIS — Z630 Problems in relationship with spouse or partner: Secondary | ICD-10-CM

## 2021-03-23 DIAGNOSIS — L564 Polymorphous light eruption: Secondary | ICD-10-CM

## 2021-03-23 NOTE — Patient Instructions (Signed)
Polymorphous Light Eruption Polymorphous light eruption is a skin reaction to rays of energy that are found in sunlight (ultraviolet radiation, or UV radiation). Tanning beds and sunlamps also use UV radiation. Polymorphous light eruption can cause itchy, red patches to develop on skin that is exposed to UV radiation. The patches usually appear immediately after or within a few hours of being in the sun or after using a tanning bed or sunlamp. They often go away on their own within several days or weeks. What are the causes? The cause of this condition is not known. What increases the risk? You are more likely to develop this condition if you: Are a woman. Have light-colored skin (fair complexion). Live in a [place]. Have a family history of this condition. What are the signs or symptoms? Symptoms of this condition include: Itchy, small patches or bumps on the skin. Redness or scales on the skin. A burning feeling on the skin. Swelling or blisters. These are rare. Chills, headache, nausea, and feeling generally sick (malaise). These symptoms are rare. How is this diagnosed? This condition may be diagnosed based on: A physical exam. Your medical history. Phototests. These are tests to check your skin's reaction to a type of ultraviolet light (UVA light). How is this treated? This condition may clear up without treatment. Treatment for this condition may include: Protecting your skin from the sun with clothing and sunscreen of at least SPF 30. Avoiding sunlight between the hours of 10 a.m. and 4 p.m. Applying medicines to the skin to reduce swelling (topical corticosteroids). Gradually spending more time in the sun to build resistance. Oral immunosuppressive therapy, in rare cases. These are medicines taken by mouth that reduce the activity of the body's disease-fighting system (immune system). Follow these instructions at home:  Take and apply over-the-counter and prescription  medicines only as told by your health care provider. Wear clothing to protect your skin from sunlight. This includes long sleeves and hats. Apply sunscreen 15-30 minutes before you go outside, even on cloudy days. Use broad-spectrum sunscreens that have an SPF of 30 or higher. Broad-spectrum means that the sunscreen protects your skin from both types of UV light (UVA and UVB). Check product labels to make sure that the sunscreen protects against UVA and UVB light. Do not use sunscreen on a baby who is younger than 57 months of age. Follow instructions from your health care provider about being in the sun. You may need to: Avoid spending time in the sun between the hours of 10 a.m. and 4 p.m. UV radiation is strongest during those hours. Gradually spend more and more time in sunlight. Keep all follow-up visits. This is important. Contact a health care provider if: You have a fever. You have severe pain, and medicines do not help. Get help right away if: You have severe skin blistering, peeling, or bleeding. You have a very high fever or chills. You have a severe headache, you feel confused, or you faint. You have nausea or vomiting. You develop body aches and pains. These symptoms may represent a serious problem that is an emergency. Do not wait to see if the symptoms will go away. Get medical help right away. Call your local emergency services (911 in the [place] Do not drive yourself to the hospital. Summary Polymorphous light eruption is a skin reaction to rays of energy found in sunlight (UV radiation). This condition can also be caused by UV radiation from tanning beds and sun lamps. You may have  itchy, red patches on your skin that go away on their own. You may not need treatment. If you do need treatment, it usually includes protective clothing to shield your skin from the sun, broad-spectrum sunscreens with an SPF of 30 or higher, and medicines that you apply to your skin. This  information is not intended to replace advice given to you by your health care provider. Make sure you discuss any questions you have with your health care provider. Document Revised: 09/10/2020 Document Reviewed: 09/10/2020 Elsevier Patient Education  [place].

## 2021-03-23 NOTE — Progress Notes (Signed)
VIRTUAL VISIT VIA VIDEO  I connected with [name] on 03/23/21 at  1:30 PM EDT by elemedicine application and verified that I am speaking with the correct person using two identifiers. Location patient: Home Location provider: [facility] Persons participating in the virtual visit: Patient, [name] and [name]. [name]  I discussed the limitations of evaluation and management by telemedicine and the availability of in person appointments. The patient expressed understanding and agreed to proceed.   SUBJECTIVE Chief Complaint  Patient presents with   Referral    For therapy and allergy     HPI: [name] is a 35 y.o. female present for  Recurrent skin rash and referral for therapy.  Patient had been established with [name] (psychology) for couple years.  She was helping [name] work through communicating better with her significant other.  [name] found therapy was a great tool for her, however her therapist has now moved on to a different location.  She is asking for new referral today and her therapist has made recommendations.  Skin rash: Patient reports she has had a recurrent skin rash across her chest and shoulders.  Both rashes have occurred after sun exposure.  She reports the rash is finally raised and itchy bumps.  She initially thought it had to do with her sunscreen and changed her sunscreens after the first presentation of the rash.  However she has had the rash again and it was not related to the sunscreen.  She did also use bug spray on that occasion.  But she use this all over her body and not just on her chest and shoulders.  She is interested in a referral to allergy to see if she is allergic.  ROS: See pertinent positives and negatives per HPI.  Patient Active Problem List   Diagnosis Date Noted   Other hyperlipidemia, pure 03/25/2020   FH: heart disease 02/13/2020   Class 3 severe obesity with serious comorbidity and body mass  index (BMI) of 45.0 to 49.9 in adult ([place]) 10/08/2015   Vitamin D deficiency 10/16/2014   Prediabetes 10/16/2014   B12 deficiency anemia 09/02/2014   PCOS (polycystic ovarian syndrome) 08/01/2014   BPES syndrome 08/01/2014    Social History   Tobacco Use   Smoking status: Never   Smokeless tobacco: Never  Substance Use Topics   Alcohol use: Yes    Alcohol/week: 0.0 standard drinks    Comment: socially    Current Outpatient Medications:    Cholecalciferol 5000 units capsule, Take 1 capsule (5,000 Units total) by mouth daily. With meal, Disp: 30 capsule, Rfl: 11   estrogens, conjugated, (PREMARIN) 0.625 MG tablet, TAKE 1 TABLET BY MOUTH ONCE DAILY, Disp: 90 tablet, Rfl: 3   medroxyPROGESTERone (PROVERA) 5 MG tablet, TAKE 1 TABLET BY MOUTH ONCE A DAY FOR 12 DAYS, Disp: 36 tablet, Rfl: 3   metFORMIN (GLUCOPHAGE-XR) 500 MG 24 hr tablet, TAKE 2 TABLETS BY MOUTH DAILY., Disp: 180 tablet, Rfl: 2   Multiple Vitamin (MULTIVITAMIN) capsule, Take 1 capsule by mouth daily., Disp: , Rfl:   No Known Allergies  OBJECTIVE: There were no vitals taken for this visit. Gen: No acute distress. Nontoxic in appearance.  Looks great. HENT: AT. [place].  MMM.  Eyes:Pupils Equal Round Reactive to light, Extraocular movements intact,  Conjunctiva without redness, discharge or icterus. Skin: No rashes, purpura or petechiae.  Neuro: Alert. Oriented x3  Psych: Normal affect and demeanor. Normal speech. Normal thought content and  judgment.  ASSESSMENT AND PLAN: [name] is a 35 y.o. female present for  relationship communication therapy  - Ambulatory referral to Psychology  Allergic contact dermatitis, unspecified trigger/PMLE Discussed possible polymorphic light eruption as cause of her rash across chest and shoulders given that both times were after exposure to sun. Agreed to referral to allergy.  In the meantime she would take precautions and refrain from direct sun exposure over the affected  areas. - Ambulatory referral to [place], DO 03/23/2021   Return if symptoms worsen or fail to improve.  Orders Placed This Encounter  Procedures   Ambulatory referral to Allergy   Ambulatory referral to Psychology   No orders of the defined types were placed in this encounter.  Referral Orders         Ambulatory referral to Allergy         Ambulatory referral to Psychology

## 2021-04-14 ENCOUNTER — Other Ambulatory Visit (HOSPITAL_COMMUNITY): Payer: Self-pay

## 2021-04-14 ENCOUNTER — Encounter (INDEPENDENT_AMBULATORY_CARE_PROVIDER_SITE_OTHER): Payer: Self-pay | Admitting: Physician Assistant

## 2021-04-14 ENCOUNTER — Ambulatory Visit (INDEPENDENT_AMBULATORY_CARE_PROVIDER_SITE_OTHER): Payer: 59 | Admitting: Physician Assistant

## 2021-04-14 ENCOUNTER — Other Ambulatory Visit: Payer: Self-pay

## 2021-04-14 VITALS — BP 116/74 | HR 66 | Temp 97.9°F | Ht 69.0 in | Wt 287.0 lb

## 2021-04-14 DIAGNOSIS — Z6841 Body Mass Index (BMI) 40.0 and over, adult: Secondary | ICD-10-CM

## 2021-04-14 DIAGNOSIS — E7849 Other hyperlipidemia: Secondary | ICD-10-CM

## 2021-04-14 DIAGNOSIS — Z9189 Other specified personal risk factors, not elsewhere classified: Secondary | ICD-10-CM

## 2021-04-14 DIAGNOSIS — E559 Vitamin D deficiency, unspecified: Secondary | ICD-10-CM | POA: Diagnosis not present

## 2021-04-16 NOTE — Progress Notes (Signed)
Chief Complaint:   OBESITY [name] is here to discuss her progress with her obesity treatment plan along with follow-up of her obesity related diagnoses. [name] is on keeping a food journal and adhering to recommended goals of 1800 calories and 120-130 grams of protein daily and states she is following her eating plan approximately 85% of the time. [name] states she is doing cardio and strengthening for 30 minutes 2-3 times per week.  Today's visit was #: 49 Starting weight: 363 lbs Starting date: 11/02/2017 Today's weight: 287 lbs Today's date: 04/14/2021 Total lbs lost to date: 76 Total lbs lost since last in-office visit: 1  Interim History: [name] decreased cheese after seeing her last cholesterol level, but doing so decreased her calories to 1600 per day. She is trying to find low carb snacks that wont raise her cholesterol.  Subjective:   1. Vitamin D deficiency [name]'s last Vit D level was 76.6. She is on OTC Vit D. I discussed labs with the patient today.  2. Other hyperlipidemia [name] feels that increasing cheese has increased her triglycerides. She has a strong family history of hyperlipidemia. I discussed labs with the patient today.  3. At risk for heart disease [name] is at a higher than average risk for cardiovascular disease due to obesity.   Assessment/Plan:   1. Vitamin D deficiency Low Vitamin D level contributes to fatigue and are associated with obesity, breast, and colon cancer. [name] will continue Vitamin D 5,000 IU daily and will follow-up for routine testing of Vitamin D, at least 2-3 times per year to avoid over-replacement.  2. Other hyperlipidemia Cardiovascular risk and specific lipid/LDL goals reviewed. We discussed several lifestyle modifications today. [name] will continue to monitor lipids, and may chose skim version of cheese. Orders and follow up as documented in patient record.   Counseling Intensive lifestyle modifications are  the first line treatment for this issue. Dietary changes: Increase soluble fiber. Decrease simple carbohydrates. Exercise changes: Moderate to vigorous-intensity aerobic activity 150 minutes per week if tolerated. Lipid-lowering medications: see documented in medical record.  3. At risk for heart disease [name] was given approximately 15 minutes of coronary artery disease prevention counseling today. She is 35 y.o. female and has risk factors for heart disease including obesity. We discussed intensive lifestyle modifications today with an emphasis on specific weight loss instructions and strategies.   Repetitive spaced learning was employed today to elicit superior memory formation and behavioral change.  4. Obesity with current BMI of 42.36 [name] is currently in the action stage of change. As such, her goal is to continue with weight loss efforts. She has agreed to keeping a food journal and adhering to recommended goals of 1800 calories and 120 grams of protein daily.   Exercise goals: As is.  Behavioral modification strategies: meal planning and cooking strategies and better snacking choices.  [name] has agreed to follow-up with our clinic in 4 weeks. She was informed of the importance of frequent follow-up visits to maximize her success with intensive lifestyle modifications for her multiple health conditions.   Objective:   Blood pressure 116/74, pulse 66, temperature 97.9 F (36.6 C), height 5\' 9"  (1.753 m), weight 287 lb (130.2 kg), SpO2 99 %. Body mass index is 42.38 kg/m.  General: Cooperative, alert, well developed, in no acute distress. HEENT: Conjunctivae and lids unremarkable. Cardiovascular: Regular rhythm.  Lungs: Normal work of breathing. Neurologic: No focal deficits.   Lab Results  Component Value Date   CREATININE 0.70  03/17/2021   BUN 19 03/17/2021   NA 140 03/17/2021   K 4.9 03/17/2021   CL 104 03/17/2021   CO2 23 03/17/2021   Lab Results  Component  Value Date   ALT 20 03/17/2021   AST 13 03/17/2021   ALKPHOS 59 03/17/2021   BILITOT 0.3 03/17/2021   Lab Results  Component Value Date   HGBA1C 5.3 03/17/2021   HGBA1C 4.9 10/27/2020   HGBA1C 5.2 07/15/2020   HGBA1C 5.3 01/15/2020   HGBA1C 5.2 04/23/2019   Lab Results  Component Value Date   INSULIN 11.1 03/17/2021   INSULIN 5.3 07/15/2020   INSULIN 12.6 01/15/2020   INSULIN 14.6 04/23/2019   INSULIN 9.0 01/02/2019   Lab Results  Component Value Date   TSH 1.41 11/30/2018   Lab Results  Component Value Date   CHOL 208 (H) 03/17/2021   HDL 58 03/17/2021   LDLCALC 130 (H) 03/17/2021   TRIG 111 03/17/2021   CHOLHDL 3.6 03/17/2021   Lab Results  Component Value Date   VD25OH 76.6 03/17/2021   VD25OH 49.8 07/15/2020   VD25OH 51.3 01/15/2020   Lab Results  Component Value Date   WBC 6.7 05/22/2019   HGB 13.0 05/22/2019   HCT 39.8 05/22/2019   MCV 86.4 05/22/2019   PLT 268.0 05/22/2019   Lab Results  Component Value Date   IRON 30 11/02/2017   TIBC 337 11/02/2017   FERRITIN 28 11/02/2017   Attestation Statements:   Reviewed by clinician on day of visit: allergies, medications, problem list, medical history, surgical history, family history, social history, and previous encounter notes.   Wilhemena Durie, am acting as transcriptionist for [employer], PA-C.  I have reviewed the above documentation for accuracy and completeness, and I agree with the above. Vanessa Potash, PA-C

## 2021-04-20 ENCOUNTER — Ambulatory Visit (INDEPENDENT_AMBULATORY_CARE_PROVIDER_SITE_OTHER): Payer: 59 | Admitting: Professional

## 2021-04-20 DIAGNOSIS — F4323 Adjustment disorder with mixed anxiety and depressed mood: Secondary | ICD-10-CM | POA: Diagnosis not present

## 2021-04-21 ENCOUNTER — Other Ambulatory Visit: Payer: Self-pay

## 2021-04-21 ENCOUNTER — Encounter: Payer: Self-pay | Admitting: Allergy

## 2021-04-21 ENCOUNTER — Ambulatory Visit: Payer: 59 | Admitting: Allergy

## 2021-04-21 VITALS — BP 124/80 | HR 68 | Temp 98.1°F | Resp 18 | Ht 69.0 in | Wt 295.1 lb

## 2021-04-21 DIAGNOSIS — J3089 Other allergic rhinitis: Secondary | ICD-10-CM

## 2021-04-21 DIAGNOSIS — L259 Unspecified contact dermatitis, unspecified cause: Secondary | ICD-10-CM

## 2021-04-21 NOTE — Progress Notes (Signed)
New Patient Note  RE: [name] MRN: [id number] DOB: Feb 03, 1986 Date of Office Visit: 04/21/2021  Consult requested by: Ma Hillock, DO Primary care provider: Ma Hillock, DO  Chief Complaint: Allergic Rhinitis  (Mostly during the spring time ) and Other (Breaks out mostly shows up after using sunscreen. Spot appears on chest and arms )  History of Present Illness: I had the pleasure of seeing [name] for initial evaluation at the [facility] on 04/21/2021. She is a 35 y.o. female, who is referred here by Howard Pouch A, DO for the evaluation of rash.  Skin:  Last summer, patient broke out using sunscreen mainly on the chest, shoulders and arms. Describes them as slightly itchy, red, slightly raised. Rash resolved within 1 week. Patient did not use any topical creams or other medications for this.   This past summer, patient broke out after using a different type of sunscreen. She also used a bug spray later that day. 2 days afterwards patient broke out again on the chest and arms. Rash resolved within 1 week with no intervention.  Patient has not used any sunscreen or bug spray since then.  She is going to [place] next week and concerned about breaking out again.   She can't recall the specific names of sunscreens used.   Suspected triggers are unknown - possibly the sunscreen. Denies any fevers, chills, changes in medications, foods, or recent infections.  Previous work up includes: none. Previous history of rash/hives: rash with estrogen patches.  Patient had reaction to poison ivy in the past.   Rhinitis: She reports symptoms of sneezing, rhinorrhea, nasal congestion. Symptoms have been going on for many years. The symptoms are present mainly in the spring and less in the fall.  She has used benadryl and Claritin with fair improvement in symptoms. Sinus infections: no. Previous work up includes: none. Sometimes difficulty taking a deeper  breath during pollen season. No prior history of asthma/inhaler use.   03/23/2021 PCP visit: "Skin rash: Patient reports she has had a recurrent skin rash across her chest and shoulders.  Both rashes have occurred after sun exposure.  She reports the rash is finally raised and itchy bumps.  She initially thought it had to do with her sunscreen and changed her sunscreens after the first presentation of the rash.  However she has had the rash again and it was not related to the sunscreen.  She did also use bug spray on that occasion.  But she use this all over her body and not just on her chest and shoulders.  She is interested in a referral to allergy to see if she is allergic."  Assessment and Plan: Saadiya is a 35 y.o. female with: Contact dermatitis Broke out in rash x 2 after using different sunscreens and exposed to the sun mainly on the upper torso, shoulders and arms. History of contact dermatitis with poison ivy and estrogen patch. Discussed with patient that she may have photocontact dermatitis/sensitivity to sunscreen + sun exposure.  Sunscreens to try: Blue lizard or Vanicream sunscreen  Take pictures of the rash.  May use OTC hydrocortisone cream twice a day as needed for mild rash flares - okay to use on the face, neck, groin area. Do not use more than 1 week at a time. Wash off sunscreen after each day and limit direct sun exposure.  Recommend patch testing next.  We have TRUE test available.  We do not  offer photo patch testing - that may need to be referred out to dermatology.    Other allergic rhinitis Rhinitis symptoms mainly in the spring and less in the fall. Takes OTC antihistamines with good benefit. Use over the counter antihistamines such as Zyrtec (cetirizine), Claritin (loratadine), Allegra (fexofenadine), or Xyzal (levocetirizine) daily as needed. May take twice a day during allergy flares. May switch antihistamines every few months. See environmental control measures  as below.  If symptoms worsen, then recommend undergoing environmental allergy testing - patient declined today as symptoms are minimal.   Return if symptoms worsen or fail to improve.  No orders of the defined types were placed in this encounter.  Lab Orders  No laboratory test(s) ordered today   Other allergy screening: Asthma: no Food allergy: no Medication allergy: no Hymenoptera allergy: no Urticaria: no Eczema:no History of recurrent infections suggestive of immunodeficency: no  Diagnostics: None.   Past Medical History: Patient Active Problem List   Diagnosis Date Noted   Contact dermatitis 04/21/2021   Other allergic rhinitis 04/21/2021   Other hyperlipidemia, pure 03/25/2020   FH: heart disease 02/13/2020   Class 3 severe obesity with serious comorbidity and body mass index (BMI) of 45.0 to 49.9 in adult ([facility]) 10/08/2015   Vitamin D deficiency 10/16/2014   Prediabetes 10/16/2014   B12 deficiency anemia 09/02/2014   PCOS (polycystic ovarian syndrome) 08/01/2014   BPES syndrome 08/01/2014   Past Medical History:  Diagnosis Date   Anemia    Back pain    BPES syndrome    Enlarged thyroid    Infertility, female    Knee dislocation 2010   Left   Obesity    Palpitations    PCOS (polycystic ovarian syndrome)    PONV (postoperative nausea and vomiting)    Prediabetes    Premature ovarian failure    Vitamin B 12 deficiency    Vitamin D deficiency    Wears eyeglasses    Past Surgical History: Past Surgical History:  Procedure Laterality Date   ADENOIDECTOMY     EYE SURGERY Bilateral    NASAL SEPTOPLASTY W/ TURBINOPLASTY Bilateral 03/04/2017   Procedure: NASAL SEPTOPLASTY WITH BILATERAL INFERIOR TURBINATE REDUCTION;  Surgeon: Jerrell Belfast, MD;  Location: [facility];  Service: ENT;  Laterality: Bilateral;   NASAL SEPTUM SURGERY  03/04/2017   TONSILLECTOMY     TONSILLECTOMY AND ADENOIDECTOMY Bilateral 03/04/2017   Procedure: TONSILLECTOMY;  Surgeon:  Jerrell Belfast, MD;  Location: [facility];  Service: ENT;  Laterality: Bilateral;   Medication List:  Current Outpatient Medications  Medication Sig Dispense Refill   Cholecalciferol 5000 units capsule Take 1 capsule (5,000 Units total) by mouth daily. With meal 30 capsule 11   estrogens, conjugated, (PREMARIN) 0.625 MG tablet TAKE 1 TABLET BY MOUTH ONCE DAILY 90 tablet 3   medroxyPROGESTERone (PROVERA) 5 MG tablet TAKE 1 TABLET BY MOUTH ONCE A DAY FOR 12 DAYS 36 tablet 3   metFORMIN (GLUCOPHAGE-XR) 500 MG 24 hr tablet TAKE 2 TABLETS BY MOUTH DAILY. 180 tablet 2   Multiple Vitamin (MULTIVITAMIN) capsule Take 1 capsule by mouth daily.     No current facility-administered medications for this visit.   Allergies: No Known Allergies Social History: Social History   Socioeconomic History   Marital status: Significant Other    Spouse name: Coralee North   Number of children: 0   Years of education: Not on file   Highest education level: Not on file  Occupational History   Occupation: Engineer, mining  Employer: [employer]    Comment:  step-down, med surg  Tobacco Use   Smoking status: Never   Smokeless tobacco: Never  Vaping Use   Vaping Use: Never used  Substance and Sexual Activity   Alcohol use: Yes    Alcohol/week: 0.0 standard drinks    Comment: socially   Drug use: No   Sexual activity: Never  Other Topics Concern   Not on file  Social History Narrative   [name] lives alone. 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   Social Determinants of Health   Financial Resource Strain: Not on file  Food Insecurity: Not on file  Transportation Needs: Not on file  Physical Activity: Not on file  Stress: Not on file  Social Connections: Not on file   Lives in a house built in 1998. Smoking: denies Occupation: NP in critical care.   Environmental History: Water Damage/mildew in the house: no Carpet in the family room: no Carpet in the bedroom:  no Heating: electric Cooling: central Pet: yes 1 cat x 1 yr and 1 dog x 3 yrs  Family History: Family History  Problem Relation Age of Onset   Hyperlipidemia Father    Hypertension Father    Heart disease Father    Sudden death Father        Age 83   Other Father        BPES   Heart attack Father    Other Other        BPES   Other Other        BPES   Hyperlipidemia Mother    Hypertension Mother    Diabetes Mother    Anxiety disorder Mother    Obesity Mother    Other Brother        BPES   Other Sister        BPES   Other Paternal Uncle        BPES   Other Cousin        BPES   Other Cousin        BPES   Heart attack Maternal Grandmother    Heart disease Maternal Grandfather    Heart attack Maternal Grandfather    Lung cancer Maternal Grandfather    Heart disease Paternal Grandfather heartr attack   Heart attack Paternal Grandfather 61       MI   Sudden death Paternal Grandfather    Problem                               Relation Asthma                                   No  Eczema                                Sister  Food allergy                          No  Allergic rhino conjunctivitis     No  Review of Systems  Constitutional:  Negative for appetite change, chills, fever and unexpected weight change.  HENT:  Negative for congestion and rhinorrhea.   Eyes:  Negative for itching.  Respiratory:  Negative for cough, chest tightness, shortness of breath and wheezing.   Cardiovascular:  Negative for chest pain.  Gastrointestinal:  Negative for abdominal pain.  Genitourinary:  Negative for difficulty urinating.  Skin:  Negative for rash.  Neurological:  Negative for headaches.   Objective: BP 124/80   Pulse 68   Temp 98.1 F (36.7 C)   Resp 18   Ht 5\' 9"  (1.753 m)   Wt 295 lb 1.9 oz (133.9 kg)   SpO2 100%   BMI 43.58 kg/m  Body mass index is 43.58 kg/m. Physical Exam Vitals and nursing note reviewed.  Constitutional:      Appearance: Normal  appearance. She is well-developed.  HENT:     Head: Normocephalic and atraumatic.     Right Ear: Tympanic membrane and external ear normal.     Left Ear: Tympanic membrane and external ear normal.     Nose: Nose normal.     Mouth/Throat:     Mouth: Mucous membranes are moist.     Pharynx: Oropharynx is clear.  Eyes:     Conjunctiva/sclera: Conjunctivae normal.  Cardiovascular:     Rate and Rhythm: Normal rate and regular rhythm.     Heart sounds: Normal heart sounds. No murmur heard.   No friction rub. No gallop.  Pulmonary:     Effort: Pulmonary effort is normal.     Breath sounds: Normal breath sounds. No wheezing, rhonchi or rales.  Musculoskeletal:     Cervical back: Neck supple.  Skin:    General: Skin is warm.     Findings: No rash.  Neurological:     Mental Status: She is alert and oriented to person, place, and time.  Psychiatric:        Behavior: Behavior normal.  The plan was reviewed with the patient/family, and all questions/concerned were addressed.  It was my pleasure to see Caylor today and participate in her care. Please feel free to contact me with any questions or concerns.  Sincerely,  Rexene Alberts, DO Allergy & Immunology  [facility] office: [place] office: [phone]

## 2021-04-21 NOTE — Assessment & Plan Note (Addendum)
Rhinitis symptoms mainly in the spring and less in the fall. Takes OTC antihistamines with good benefit. . Use over the counter antihistamines such as Zyrtec (cetirizine), Claritin (loratadine), Allegra (fexofenadine), or Xyzal (levocetirizine) daily as needed. May take twice a day during allergy flares. May switch antihistamines every few months. . See environmental control measures as below.  . If symptoms worsen, then recommend undergoing environmental allergy testing - patient declined today as symptoms are minimal.

## 2021-04-21 NOTE — Patient Instructions (Signed)
Sunscreens to try: Blue lizard  Vanicream sunscreen   Skin: Take pictures of the rash.  May use OTC hydrocortisone cream twice a day as needed for mild rash flares - okay to use on the face, neck, groin area. Do not use more than 1 week at a time.  Wash off sunscreen after each day.  Recommend patch testing next.  Patches are best placed on Monday with return to office on Wednesday and Friday of same week for readings.  Patches once placed should not get wet.  You do not have to stop any medications for patch testing but should not be on oral prednisone. You can schedule a patch testing visit when convenient for your schedule.    Allergic rhinitis: Use over the counter antihistamines such as Zyrtec (cetirizine), Claritin (loratadine), Allegra (fexofenadine), or Xyzal (levocetirizine) daily as needed. May take twice a day during allergy flares. May switch antihistamines every few months. See environmental control measures as below.   Follow up as needed - for patch testing.   True Test looks for the following sensitivities:       Reducing Pollen Exposure Pollen seasons: trees (spring), grass (summer) and ragweed/weeds (fall). Keep windows closed in your home and car to lower pollen exposure.  Install air conditioning in the bedroom and throughout the house if possible.  Avoid going out in dry windy days - especially early morning. Pollen counts are highest between 5 - 10 AM and on dry, hot and windy days.  Save outside activities for late afternoon or after a heavy rain, when pollen levels are lower.  Avoid mowing of grass if you have grass pollen allergy. Be aware that pollen can also be transported indoors on people and pets.  Dry your clothes in an automatic dryer rather than hanging them outside where they might collect pollen.  Rinse hair and eyes before bedtime.

## 2021-04-21 NOTE — Assessment & Plan Note (Addendum)
Broke out in rash x 2 after using different sunscreens and exposed to the sun mainly on the upper torso, shoulders and arms. History of contact dermatitis with poison ivy and estrogen patch.  Discussed with patient that she may have photocontact dermatitis/sensitivity to sunscreen + sun exposure.   Sunscreens to try: Blue lizard or Vanicream sunscreen   Take pictures of the rash.  . May use OTC hydrocortisone cream twice a day as needed for mild rash flares - okay to use on the face, neck, groin area. Do not use more than 1 week at a time. . Wash off sunscreen after each day and limit direct sun exposure.  . Recommend patch testing next.  o We have TRUE test available.  o We do not offer photo patch testing - that may need to be referred out to dermatology.

## 2021-04-27 DIAGNOSIS — E2839 Other primary ovarian failure: Secondary | ICD-10-CM | POA: Diagnosis not present

## 2021-05-04 ENCOUNTER — Ambulatory Visit (INDEPENDENT_AMBULATORY_CARE_PROVIDER_SITE_OTHER): Payer: 59 | Admitting: Professional

## 2021-05-04 DIAGNOSIS — F4323 Adjustment disorder with mixed anxiety and depressed mood: Secondary | ICD-10-CM | POA: Diagnosis not present

## 2021-05-07 ENCOUNTER — Other Ambulatory Visit (HOSPITAL_COMMUNITY): Payer: Self-pay

## 2021-05-07 MED ORDER — NORGESTIMATE-ETH ESTRADIOL 0.25-35 MG-MCG PO TABS
ORAL_TABLET | ORAL | 2 refills | Status: DC
Start: 2021-05-07 — End: 2021-07-06
  Filled 2021-05-07: qty 28, 28d supply, fill #0
  Filled 2021-06-02: qty 28, 28d supply, fill #1

## 2021-05-11 ENCOUNTER — Other Ambulatory Visit: Payer: Self-pay

## 2021-05-11 ENCOUNTER — Ambulatory Visit (INDEPENDENT_AMBULATORY_CARE_PROVIDER_SITE_OTHER): Payer: 59 | Admitting: Family Medicine

## 2021-05-11 ENCOUNTER — Encounter (INDEPENDENT_AMBULATORY_CARE_PROVIDER_SITE_OTHER): Payer: Self-pay | Admitting: Family Medicine

## 2021-05-11 VITALS — BP 117/78 | HR 65 | Temp 98.0°F | Ht 69.0 in | Wt 287.0 lb

## 2021-05-11 DIAGNOSIS — Z6841 Body Mass Index (BMI) 40.0 and over, adult: Secondary | ICD-10-CM | POA: Diagnosis not present

## 2021-05-11 DIAGNOSIS — E559 Vitamin D deficiency, unspecified: Secondary | ICD-10-CM

## 2021-05-11 DIAGNOSIS — R7303 Prediabetes: Secondary | ICD-10-CM

## 2021-05-11 NOTE — Progress Notes (Signed)
Chief Complaint:   OBESITY [name] is here to discuss her progress with her obesity treatment plan along with follow-up of her obesity related diagnoses. [name] is on keeping a food journal and adhering to recommended goals of 1800 calories and 120+ grams protein and states she is following her eating plan approximately 70% of the time. [name] states she is strength training and doing cardio 30 minutes 3 times per week.  Today's visit was #: 61 Starting weight: 363 lbs Starting date: 11/02/2017 Today's weight: 287 lbs Today's date: 05/11/2021 Total lbs lost to date: 76 Total lbs lost since last in-office visit: 0  Interim History: [name] recently returned from an all inclusive trip to [place] (near [place]). She is very proud of maintaining her weight. She is having a small get together for Thanksgiving. Pt and her husband work Christmas day, so they are celebrating early. Pt just saw [facility] and likely will be anticipating transfer of embryos mid January.  Subjective:   1. Vitamin D deficiency Pt denies nausea, vomiting, and muscle weakness but notes fatigue. Her last Vit D level was 76.6.  2. Pre-diabetes Vernal is on Metformin 2 tabs daily. Her last A1c was 5.3.  Assessment/Plan:   1. Vitamin D deficiency Low Vitamin D level contributes to fatigue and are associated with obesity, breast, and colon cancer. She agrees to continue to take OTC Vitamin D 5,000 IU daily and will follow-up for routine testing of Vitamin D, at least 2-3 times per year to avoid over-replacement.  2. Pre-diabetes [name] will continue to work on weight loss, exercise, and decreasing simple carbohydrates to help decrease the risk of diabetes. Continue current treatment plan.  3. Obesity BMI today is 3  [name] is currently in the action stage of change. As such, her goal is to continue with weight loss efforts. She has agreed to keeping a food journal and adhering to recommended goals of  1800 calories and 120+ grams protein.   Exercise goals: All adults should avoid inactivity. Some physical activity is better than none, and adults who participate in any amount of physical activity gain some health benefits.  Behavioral modification strategies: increasing lean protein intake, meal planning and cooking strategies, keeping healthy foods in the home, and planning for success.  [name] has agreed to follow-up with our clinic in 3-4 weeks. She was informed of the importance of frequent follow-up visits to maximize her success with intensive lifestyle modifications for her multiple health conditions.   Objective:   Blood pressure 117/78, pulse 65, temperature 98 F (36.7 C), height 5\' 9"  (1.753 m), weight 287 lb (130.2 kg), last menstrual period 04/15/2021, SpO2 100 %. Body mass index is 42.38 kg/m.  General: Cooperative, alert, well developed, in no acute distress. HEENT: Conjunctivae and lids unremarkable. Cardiovascular: Regular rhythm.  Lungs: Normal work of breathing. Neurologic: No focal deficits.   Lab Results  Component Value Date   CREATININE 0.70 03/17/2021   BUN 19 03/17/2021   NA 140 03/17/2021   K 4.9 03/17/2021   CL 104 03/17/2021   CO2 23 03/17/2021   Lab Results  Component Value Date   ALT 20 03/17/2021   AST 13 03/17/2021   ALKPHOS 59 03/17/2021   BILITOT 0.3 03/17/2021   Lab Results  Component Value Date   HGBA1C 5.3 03/17/2021   HGBA1C 4.9 10/27/2020   HGBA1C 5.2 07/15/2020   HGBA1C 5.3 01/15/2020   HGBA1C 5.2 04/23/2019   Lab Results  Component Value Date  INSULIN 11.1 03/17/2021   INSULIN 5.3 07/15/2020   INSULIN 12.6 01/15/2020   INSULIN 14.6 04/23/2019   INSULIN 9.0 01/02/2019   Lab Results  Component Value Date   TSH 1.41 11/30/2018   Lab Results  Component Value Date   CHOL 208 (H) 03/17/2021   HDL 58 03/17/2021   LDLCALC 130 (H) 03/17/2021   TRIG 111 03/17/2021   CHOLHDL 3.6 03/17/2021   Lab Results  Component  Value Date   VD25OH 76.6 03/17/2021   VD25OH 49.8 07/15/2020   VD25OH 51.3 01/15/2020   Lab Results  Component Value Date   WBC 6.7 05/22/2019   HGB 13.0 05/22/2019   HCT 39.8 05/22/2019   MCV 86.4 05/22/2019   PLT 268.0 05/22/2019   Lab Results  Component Value Date   IRON 30 11/02/2017   TIBC 337 11/02/2017   FERRITIN 28 11/02/2017    Attestation Statements:   Reviewed by clinician on day of visit: allergies, medications, problem list, medical history, surgical history, family history, social history, and previous encounter notes.  Coral Ceo, CMA, am acting as transcriptionist for Coralie Common, MD.  I have reviewed the above documentation for accuracy and completeness, and I agree with the above. - Coralie Common, MD

## 2021-05-18 ENCOUNTER — Ambulatory Visit (INDEPENDENT_AMBULATORY_CARE_PROVIDER_SITE_OTHER): Payer: 59 | Admitting: Professional

## 2021-05-18 DIAGNOSIS — F4323 Adjustment disorder with mixed anxiety and depressed mood: Secondary | ICD-10-CM

## 2021-06-02 ENCOUNTER — Other Ambulatory Visit (HOSPITAL_COMMUNITY): Payer: Self-pay

## 2021-06-08 ENCOUNTER — Other Ambulatory Visit: Payer: Self-pay

## 2021-06-08 ENCOUNTER — Ambulatory Visit (INDEPENDENT_AMBULATORY_CARE_PROVIDER_SITE_OTHER): Payer: 59 | Admitting: Physician Assistant

## 2021-06-08 ENCOUNTER — Encounter (INDEPENDENT_AMBULATORY_CARE_PROVIDER_SITE_OTHER): Payer: Self-pay | Admitting: Physician Assistant

## 2021-06-08 ENCOUNTER — Encounter: Payer: Self-pay | Admitting: Professional

## 2021-06-08 ENCOUNTER — Ambulatory Visit (INDEPENDENT_AMBULATORY_CARE_PROVIDER_SITE_OTHER): Payer: 59 | Admitting: Professional

## 2021-06-08 VITALS — BP 122/75 | HR 73 | Temp 98.1°F | Ht 69.0 in | Wt 287.0 lb

## 2021-06-08 DIAGNOSIS — R7303 Prediabetes: Secondary | ICD-10-CM | POA: Diagnosis not present

## 2021-06-08 DIAGNOSIS — F4322 Adjustment disorder with anxiety: Secondary | ICD-10-CM | POA: Diagnosis not present

## 2021-06-08 DIAGNOSIS — Z6841 Body Mass Index (BMI) 40.0 and over, adult: Secondary | ICD-10-CM

## 2021-06-08 NOTE — Progress Notes (Signed)
[facility] Counselor/Therapist Progress Note  Patient ID: [name], MRN: [id number],    Date: 06/08/2021  Time Spent: 57 minutes 04-1156 am  Treatment Type: Individual Therapy  Reported Symptoms: anxious  Mental Status Exam: Appearance:  Well Groomed     Behavior: Sharing  Motor: Normal  Speech/Language:  Clear and Coherent and Normal Rate  Affect: Full Range  Mood: anxious  Thought process: goal directed  Thought content:   WNL  Sensory/Perceptual disturbances:   WNL  Orientation: oriented to person, place, time/date, and situation  Attention: Good  Concentration: Good  Memory: WNL  Fund of knowledge:  Good  Insight:   Good  Judgment:  Good  Impulse Control: Good   Risk Assessment: Danger to Self:  No Self-injurious Behavior: No Danger to Others: No Duty to Warn:no Physical Aggression / Violence:No  Access to Firearms a concern: No  Gang Involvement:No   Subjective: This session was held via video teletherapy due to the coronavirus risk at this time. The patient consented to video teletherapy and was located at her home during this session. She is aware it is the responsibility of the patient to secure confidentiality on her end of the session. The provider was in a private home office for the duration of this session.   The patient arrived on time for her webex appointment appearing nicely groomed and easily engaged  Issues addressed: 1- anxious but hopeful -she has started the injections to shut down her hormonal ovarian processes   -the clinic will take over her body prepping for pregnancy   -she had to talk herself out of the thought that it will not work   -trying to keep her expectations within reason   -she has joined an IVF support group and it "was good at the time" but she began watching the comments too much and get overwhelmed with the unsuccessful stories -end of December she will go for ultrasound as will her sister her is  going to be her donor -her sister will take medication to cause her ovaries to produce multiple eggs and will remove the mature eggs -her sister has three children, does not have PCOS , and she does not have the genetic condition -worry time 2-stress levels -during 4 12's shift Saturday into 3rd day   -eye started to twitch   -it did not stop until 5 days in -has no internal negative dialogue -doesn't think of herself, it's hard to express her emotions or explain how she was feeling -struggles to express her feelings -protective over [name]'s feelings -dedicate time to her friend relationships 3-planner by nature -considers all options before making a decision but many time sit takes time   -[name] is very much the opposite and she thinks he gets frustrated when he second guesses his decision   -his snap decisions generally turn out well   -example: decisions related to food   Interventions: Cognitive Behavioral Therapy, Assertiveness/Communication, Solution-Oriented/Positive Psychology, and Psycho-education/Bibliotherapy  Diagnosis:Adjustment disorder with anxiety  Plan:  -meet again Friday, June 26, 2021 at [facility]

## 2021-06-08 NOTE — Addendum Note (Signed)
Addended by: Ardyth Man on: 06/08/2021 02:11 PM   Modules accepted: Level of Service

## 2021-06-08 NOTE — Progress Notes (Signed)
Chief Complaint:   OBESITY [name] is here to discuss her progress with her obesity treatment plan along with follow-up of her obesity related diagnoses. [name] is on keeping a food journal and adhering to recommended goals of 1800 calories and 120 grams of protein and states she is following her eating plan approximately 80% of the time. [name] states she is cardio for 30 minutes 2-3 times per week.  Today's visit was #: 20 Starting weight: 363 lbs Starting date: 11/02/2017 Today's weight: 287 lbs Today's date: 06/08/2021 Total lbs lost to date: 76 lbs Total lbs lost since last in-office visit: 0  Interim History: [name] is having more cravings since starting Lupron  as she begins her infertility process. She hasn't met her calorie goals due to feeling nauseous after her Lupron injections. She is not journaling consistently.  Subjective:   1. Pre-diabetes [name] is currently on Metformin and she is tolerating it well. She exercises 2-3 days per week. Her last A1C was 5.3.  Assessment/Plan:   1. Pre-diabetes [name] will continue with her medications, plan and she will continue to work on weight loss, exercise, and decreasing simple carbohydrates to help decrease the risk of diabetes.   2. Obesity BMI today is 42.36 [name] is currently in the action stage of change. As such, her goal is to continue with weight loss efforts. She has agreed to keeping a food journal and adhering to recommended goals of 1700-1800 calories and 120 grams of protein daily.   Exercise goals:  As is.  Behavioral modification strategies: planning for success and keeping a strict food journal.  [name] has agreed to follow-up with our clinic in 4 weeks. She was informed of the importance of frequent follow-up visits to maximize her success with intensive lifestyle modifications for her multiple health conditions.   Objective:   Blood pressure 122/75, pulse 73, temperature 98.1 F (36.7 C),  height '5\' 9"'  (1.753 m), weight 287 lb (130.2 kg), SpO2 97 %. Body mass index is 42.38 kg/m.  General: Cooperative, alert, well developed, in no acute distress. HEENT: Conjunctivae and lids unremarkable. Cardiovascular: Regular rhythm.  Lungs: Normal work of breathing. Neurologic: No focal deficits.   Lab Results  Component Value Date   CREATININE 0.70 03/17/2021   BUN 19 03/17/2021   NA 140 03/17/2021   K 4.9 03/17/2021   CL 104 03/17/2021   CO2 23 03/17/2021   Lab Results  Component Value Date   ALT 20 03/17/2021   AST 13 03/17/2021   ALKPHOS 59 03/17/2021   BILITOT 0.3 03/17/2021   Lab Results  Component Value Date   HGBA1C 5.3 03/17/2021   HGBA1C 4.9 10/27/2020   HGBA1C 5.2 07/15/2020   HGBA1C 5.3 01/15/2020   HGBA1C 5.2 04/23/2019   Lab Results  Component Value Date   INSULIN 11.1 03/17/2021   INSULIN 5.3 07/15/2020   INSULIN 12.6 01/15/2020   INSULIN 14.6 04/23/2019   INSULIN 9.0 01/02/2019   Lab Results  Component Value Date   TSH 1.41 11/30/2018   Lab Results  Component Value Date   CHOL 208 (H) 03/17/2021   HDL 58 03/17/2021   LDLCALC 130 (H) 03/17/2021   TRIG 111 03/17/2021   CHOLHDL 3.6 03/17/2021   Lab Results  Component Value Date   VD25OH 76.6 03/17/2021   VD25OH 49.8 07/15/2020   VD25OH 51.3 01/15/2020   Lab Results  Component Value Date   WBC 6.7 05/22/2019   HGB 13.0 05/22/2019   HCT 39.8  05/22/2019   MCV 86.4 05/22/2019   PLT 268.0 05/22/2019   Lab Results  Component Value Date   IRON 30 11/02/2017   TIBC 337 11/02/2017   FERRITIN 28 11/02/2017   Attestation Statements:   Reviewed by clinician on day of visit: allergies, medications, problem list, medical history, surgical history, family history, social history, and previous encounter notes.  Time spent on visit including pre-visit chart review and post-visit care and charting was 34 minutes.   I, Tonye Pearson, am acting as Location manager for [employer],  PA-C.  I have reviewed the above documentation for accuracy and completeness, and I agree with the above. Abby Potash, PA-C

## 2021-06-16 DIAGNOSIS — E2839 Other primary ovarian failure: Secondary | ICD-10-CM | POA: Diagnosis not present

## 2021-06-16 DIAGNOSIS — N840 Polyp of corpus uteri: Secondary | ICD-10-CM | POA: Diagnosis not present

## 2021-06-17 ENCOUNTER — Encounter: Payer: Self-pay | Admitting: Family Medicine

## 2021-06-17 ENCOUNTER — Encounter (INDEPENDENT_AMBULATORY_CARE_PROVIDER_SITE_OTHER): Payer: Self-pay | Admitting: Physician Assistant

## 2021-06-17 NOTE — Telephone Encounter (Signed)
Please advise on message below.

## 2021-06-17 NOTE — Telephone Encounter (Signed)
Please see message and advise.  Thank you. ° °

## 2021-06-21 NOTE — L&D Delivery Note (Signed)
Delivery Note At 4:04 AM a viable and healthy female was delivered via Vaginal, Spontaneous (Presentation:   Occiput Anterior).  APGAR: 8, 8; weight  pending.   Placenta status: Spontaneous, Intact.  Cord: CAN x 1 reducible 3 vessels with the following complications: None.  Cord pH: n/a  Anesthesia: Epidural Episiotomy: None Lacerations:  left Vaginal Suture Repair: 3.0 chromic Est. Blood Loss (mL): 204  Mom to postpartum.  Baby to Couplet care / Skin to Skin.  Wanisha Shiroma A Aviya Jarvie 03/22/2022, 4:44 AM

## 2021-06-26 ENCOUNTER — Ambulatory Visit: Payer: 59 | Admitting: Professional

## 2021-06-26 DIAGNOSIS — E2839 Other primary ovarian failure: Secondary | ICD-10-CM | POA: Diagnosis not present

## 2021-06-26 DIAGNOSIS — N979 Female infertility, unspecified: Secondary | ICD-10-CM | POA: Diagnosis not present

## 2021-07-06 ENCOUNTER — Encounter (INDEPENDENT_AMBULATORY_CARE_PROVIDER_SITE_OTHER): Payer: Self-pay | Admitting: Physician Assistant

## 2021-07-06 ENCOUNTER — Ambulatory Visit (INDEPENDENT_AMBULATORY_CARE_PROVIDER_SITE_OTHER): Payer: 59 | Admitting: Physician Assistant

## 2021-07-06 ENCOUNTER — Other Ambulatory Visit: Payer: Self-pay

## 2021-07-06 VITALS — BP 117/80 | HR 75 | Temp 98.4°F | Ht 69.0 in | Wt 287.0 lb

## 2021-07-06 DIAGNOSIS — R7303 Prediabetes: Secondary | ICD-10-CM

## 2021-07-06 DIAGNOSIS — Z6841 Body Mass Index (BMI) 40.0 and over, adult: Secondary | ICD-10-CM | POA: Diagnosis not present

## 2021-07-06 MED ORDER — METFORMIN HCL ER 500 MG PO TB24: 500.0000 mg | ORAL_TABLET | Freq: Every day | ORAL | 2 refills | Status: DC

## 2021-07-06 NOTE — Progress Notes (Signed)
Chief Complaint:   OBESITY [name] is here to discuss her progress with her obesity treatment plan along with follow-up of her obesity related diagnoses. Sparkles is on the Category 3 Plan and states she is following her eating plan approximately 50-60% of the time. [name] states she is doing cardio for 30 minutes 1 time per week.  Today's visit was #: 2 Starting weight: 257 lbs Starting date: 06/18/2021 Today's weight: 251 lbs Today's date: 07/06/2021 Total lbs lost to date: 6 Total lbs lost since last in-office visit: 0  Interim History: [name] is on IVF medications and she has noticed an increase in cravings for carbohydrates and emotional ups and downs. She has had no desire to exercise. She has not been journaling consistently. Her egg transfer takes place in 2 days.   Subjective:   1. Pre-diabetes Sui continues to take 1 metformin daily after talking with her fertility specialist and primary care physician.  Assessment/Plan:   1. Pre-diabetes [name] will continue with metformin, meal plan, and decreasing simple carbohydrates to help decrease the risk of diabetes.   2. Obesity BMI today is 42.36 [name] is currently in the action stage of change. As such, her goal is to continue with weight loss efforts. She has agreed to keeping a food journal and adhering to recommended goals of 1700-1800 calories and 120 grams of protein daily.   Exercise goals: As is.  Behavioral modification strategies: planning for success and keeping a strict food journal.  [name] has agreed to follow-up with our clinic in 2 weeks. She was informed of the importance of frequent follow-up visits to maximize her success with intensive lifestyle modifications for her multiple health conditions.   Objective:   Blood pressure 117/80, pulse 75, temperature 98.4 F (36.9 C), temperature source Oral, height 5\' 9"  (1.753 m), weight 287 lb (130.2 kg), SpO2 99 %. Body mass index is 42.38  kg/m.  General: Cooperative, alert, well developed, in no acute distress. HEENT: Conjunctivae and lids unremarkable. Cardiovascular: Regular rhythm.  Lungs: Normal work of breathing. Neurologic: No focal deficits.   Lab Results  Component Value Date   CREATININE 0.70 03/17/2021   BUN 19 03/17/2021   NA 140 03/17/2021   K 4.9 03/17/2021   CL 104 03/17/2021   CO2 23 03/17/2021   Lab Results  Component Value Date   ALT 20 03/17/2021   AST 13 03/17/2021   ALKPHOS 59 03/17/2021   BILITOT 0.3 03/17/2021   Lab Results  Component Value Date   HGBA1C 5.3 03/17/2021   HGBA1C 4.9 10/27/2020   HGBA1C 5.2 07/15/2020   HGBA1C 5.3 01/15/2020   HGBA1C 5.2 04/23/2019   Lab Results  Component Value Date   INSULIN 11.1 03/17/2021   INSULIN 5.3 07/15/2020   INSULIN 12.6 01/15/2020   INSULIN 14.6 04/23/2019   INSULIN 9.0 01/02/2019   Lab Results  Component Value Date   TSH 1.41 11/30/2018   Lab Results  Component Value Date   CHOL 208 (H) 03/17/2021   HDL 58 03/17/2021   LDLCALC 130 (H) 03/17/2021   TRIG 111 03/17/2021   CHOLHDL 3.6 03/17/2021   Lab Results  Component Value Date   VD25OH 76.6 03/17/2021   VD25OH 49.8 07/15/2020   VD25OH 51.3 01/15/2020   Lab Results  Component Value Date   WBC 6.7 05/22/2019   HGB 13.0 05/22/2019   HCT 39.8 05/22/2019   MCV 86.4 05/22/2019   PLT 268.0 05/22/2019   Lab Results  Component Value  Date   IRON 30 11/02/2017   TIBC 337 11/02/2017   FERRITIN 28 11/02/2017   Attestation Statements:   Reviewed by clinician on day of visit: allergies, medications, problem list, medical history, surgical history, family history, social history, and previous encounter notes.  Time spent on visit including pre-visit chart review and post-visit care and charting was 33 minutes.    Wilhemena Durie, am acting as transcriptionist for [employer], PA-C.  I have reviewed the above documentation for accuracy and completeness, and I agree  with the above. Abby Potash, PA-C

## 2021-07-08 DIAGNOSIS — N979 Female infertility, unspecified: Secondary | ICD-10-CM | POA: Diagnosis not present

## 2021-07-08 DIAGNOSIS — Z3183 Encounter for assisted reproductive fertility procedure cycle: Secondary | ICD-10-CM | POA: Diagnosis not present

## 2021-07-17 DIAGNOSIS — N912 Amenorrhea, unspecified: Secondary | ICD-10-CM | POA: Diagnosis not present

## 2021-07-20 ENCOUNTER — Other Ambulatory Visit: Payer: Self-pay

## 2021-07-20 ENCOUNTER — Ambulatory Visit (INDEPENDENT_AMBULATORY_CARE_PROVIDER_SITE_OTHER): Payer: 59 | Admitting: Physician Assistant

## 2021-07-20 ENCOUNTER — Encounter (INDEPENDENT_AMBULATORY_CARE_PROVIDER_SITE_OTHER): Payer: Self-pay | Admitting: Physician Assistant

## 2021-07-20 VITALS — BP 106/70 | HR 82 | Temp 98.1°F | Ht 69.0 in | Wt 287.0 lb

## 2021-07-20 DIAGNOSIS — Z6841 Body Mass Index (BMI) 40.0 and over, adult: Secondary | ICD-10-CM

## 2021-07-20 DIAGNOSIS — N912 Amenorrhea, unspecified: Secondary | ICD-10-CM | POA: Diagnosis not present

## 2021-07-20 DIAGNOSIS — E559 Vitamin D deficiency, unspecified: Secondary | ICD-10-CM | POA: Diagnosis not present

## 2021-07-20 DIAGNOSIS — E669 Obesity, unspecified: Secondary | ICD-10-CM | POA: Diagnosis not present

## 2021-07-20 DIAGNOSIS — Z3490 Encounter for supervision of normal pregnancy, unspecified, unspecified trimester: Secondary | ICD-10-CM

## 2021-07-20 NOTE — Progress Notes (Signed)
Chief Complaint:   OBESITY [name] is here to discuss her progress with her obesity treatment plan along with follow-up of her obesity related diagnoses. [name] is on keeping a food journal and adhering to recommended goals of 1700-1800 calories and 120 grams of protein daily and states she is following her eating plan approximately 80% of the time. [name] states she is doing cardio for 30 minutes 1 time per week.  Today's visit was #:66  Starting weight: 363 lbs Starting date: 11/02/2017 Today's weight: 287 lbs Today's date: 07/20/2021 Total lbs lost to date: 76 Total lbs lost since last in-office visit: 0  Interim History: [name] had a positive pregnancy test 3 days ago. She has been very tired and has not felt like eating the last few days due to some nausea. She will continue to follow up with her fertility specialist.   Subjective:   1. Vitamin D deficiency [name]'s fertility specialist confirmed that it is ok for her to continue with Vit D for now. She is on 5,000 units daily.  2. Pregnancy, unspecified gestational age [name] had a positive HCG test 3 days ago. She is seeing a fertility specialist. She has a follow up HCG test today.  Assessment/Plan:   1. Vitamin D deficiency Low Vitamin D level contributes to fatigue and are associated with obesity, breast, and colon cancer. [name] will continue Vitamin D 5,000 units daily. She will follow-up for routine testing of Vitamin D, at least 2-3 times per year to avoid over-replacement.  2. Pregnancy, unspecified gestational age [name] continue to follow up with her fertility specialist.  3. Obesity BMI today is 42.36 [name] is currently in the action stage of change. As such, her goal is to continue with weight loss efforts. She has agreed to keeping a food journal and adhering to recommended goals of 1800-2200 calories and 120 grams of protein daily.   Exercise goals: As is.  Behavioral modification strategies: no  skipping meals and meal planning and cooking strategies.  [name] has agreed to follow-up with our clinic in 4 weeks. She was informed of the importance of frequent follow-up visits to maximize her success with intensive lifestyle modifications for her multiple health conditions.   Objective:   Blood pressure 106/70, pulse 82, temperature 98.1 F (36.7 C), height 5\' 9"  (1.753 m), weight 287 lb (130.2 kg), SpO2 100 %. Body mass index is 42.38 kg/m.  General: Cooperative, alert, well developed, in no acute distress. HEENT: Conjunctivae and lids unremarkable. Cardiovascular: Regular rhythm.  Lungs: Normal work of breathing. Neurologic: No focal deficits.   Lab Results  Component Value Date   CREATININE 0.70 03/17/2021   BUN 19 03/17/2021   NA 140 03/17/2021   K 4.9 03/17/2021   CL 104 03/17/2021   CO2 23 03/17/2021   Lab Results  Component Value Date   ALT 20 03/17/2021   AST 13 03/17/2021   ALKPHOS 59 03/17/2021   BILITOT 0.3 03/17/2021   Lab Results  Component Value Date   HGBA1C 5.3 03/17/2021   HGBA1C 4.9 10/27/2020   HGBA1C 5.2 07/15/2020   HGBA1C 5.3 01/15/2020   HGBA1C 5.2 04/23/2019   Lab Results  Component Value Date   INSULIN 11.1 03/17/2021   INSULIN 5.3 07/15/2020   INSULIN 12.6 01/15/2020   INSULIN 14.6 04/23/2019   INSULIN 9.0 01/02/2019   Lab Results  Component Value Date   TSH 1.41 11/30/2018   Lab Results  Component Value Date   CHOL 208 (H)  03/17/2021   HDL 58 03/17/2021   LDLCALC 130 (H) 03/17/2021   TRIG 111 03/17/2021   CHOLHDL 3.6 03/17/2021   Lab Results  Component Value Date   VD25OH 76.6 03/17/2021   VD25OH 49.8 07/15/2020   VD25OH 51.3 01/15/2020   Lab Results  Component Value Date   WBC 6.7 05/22/2019   HGB 13.0 05/22/2019   HCT 39.8 05/22/2019   MCV 86.4 05/22/2019   PLT 268.0 05/22/2019   Lab Results  Component Value Date   IRON 30 11/02/2017   TIBC 337 11/02/2017   FERRITIN 28 11/02/2017   Attestation  Statements:   Reviewed by clinician on day of visit: allergies, medications, problem list, medical history, surgical history, family history, social history, and previous encounter notes.  Time spent on visit including pre-visit chart review and post-visit care and charting was 30 minutes.    Wilhemena Durie, am acting as transcriptionist for [employer], PA-C.  I have reviewed the above documentation for accuracy and completeness, and I agree with the above. Abby Potash, PA-C

## 2021-07-21 ENCOUNTER — Other Ambulatory Visit (HOSPITAL_COMMUNITY): Payer: Self-pay

## 2021-07-21 ENCOUNTER — Encounter: Payer: Self-pay | Admitting: Professional

## 2021-07-21 ENCOUNTER — Ambulatory Visit (INDEPENDENT_AMBULATORY_CARE_PROVIDER_SITE_OTHER): Payer: 59 | Admitting: Professional

## 2021-07-21 DIAGNOSIS — F4322 Adjustment disorder with anxiety: Secondary | ICD-10-CM | POA: Diagnosis not present

## 2021-07-21 MED ORDER — ESTRADIOL 2 MG PO TABS
ORAL_TABLET | ORAL | 1 refills | Status: DC
  Filled 2021-07-21: qty 60, 30d supply, fill #0
  Filled 2021-08-17: qty 10, 5d supply, fill #1
  Filled 2021-08-25: qty 10, 5d supply, fill #2

## 2021-07-21 NOTE — Progress Notes (Addendum)
[facility] Counselor/Therapist Progress Note  Patient ID: [name], MRN: [id number],    Date: 07/21/2021  Time Spent: 60 minutes 3-4 pm  Treatment Type: Individual Therapy  Reported Symptoms: anxious  Mental Status Exam: Appearance:  Well Groomed     Behavior: Sharing  Motor: Normal  Speech/Language:  Clear and Coherent and Normal Rate  Affect: Full Range  Mood: anxious  Thought process: goal directed  Thought content:   WNL  Sensory/Perceptual disturbances:   WNL  Orientation: oriented to person, place, time/date, and situation  Attention: Good  Concentration: Good  Memory: WNL  Fund of knowledge:  Good  Insight:   Good  Judgment:  Good  Impulse Control: Good   Risk Assessment: Danger to Self:  No Self-injurious Behavior: No Danger to Others: No Duty to Warn:no Physical Aggression / Violence:No  Access to Firearms a concern: No  Gang Involvement:No   Subjective: This session was held via video teletherapy due to the coronavirus risk at this time. The patient consented to video teletherapy and was located at her home during this session. She is aware it is the responsibility of the patient to secure confidentiality on her end of the session. The provider was in a private home office for the duration of this session.   The patient arrived on time for her webex appointment appearing nicely groomed and easily engaged  Issues addressed: 1-patient is pregnant -she is due on October 6th -[facility] implanted and will follow until ultrasound on February 14th -will then follow with a regular OB-GYN -her mentality of "it's not just me know" -she has been eating the way she should -she will talk on the phone when driving  2-self-care 3-marital -how do you encourage or motivate without enabling   -educated   -examples -he feels like she does not need him and that bothers him a lot   -she shared that she relied on her twin for everything   -pt's spouse  does not feel needed due to pt's level of independence -making intentional time to spend with spouse   -pt and spouse do not really enjoy the same things     -doing something in the morning/afternoon or evening to allow for both their interests -cannot change spouse and he needs to commit to himself  Goals 1. Achieve a reasonable level of family connectedness and harmony where members support, help, and are concerned for each other. 2. Begin a healthy grieving process around the loss. Objective Identify how avoiding dealing with loss has negatively impacted life. Target Date: 2022-05-03 Frequency: Biweekly  Progress: 0 Modality: individual  Related Interventions New intervention Ask the client to list ways that avoidance of grieving has negatively impacted his/her life. Objective Identify what stages of grief have been experienced in the continuum of the grieving process. Target Date: 2022-05-03 Frequency: Biweekly  Progress: 0 Modality: individual  Related Interventions Assist the client in identifying the stages of grief that he/she has experienced and which stage he/she is presently working through. Educate the client on the stages of the grieving process and answer any questions he/she may have. 3. Develop an awareness of how the avoidance of grieving has affected life and begin the healing process. Objective Verbalize resolution of feelings of guilt and regret associated with the loss. Target Date: 2022-05-03 Frequency: Biweekly  Progress: 0 Modality: individual  Objective Verbalize and resolve feelings of anger or guilt focused on self or deceased loved one that interfere with the grieving process. Target Date:  2022-05-03 Frequency: Biweekly  Progress: 0 Modality: individual  Objective Begin verbalizing feelings associated with the loss. Target Date: 2022-05-03 Frequency: Biweekly  Progress: 0 Modality: individual  Objective Tell in detail the story of the current loss that  is triggering symptoms. Target Date: 2022-05-03 Frequency: Biweekly  Progress: 0 Modality: individual  4. Develop the necessary skills for effective, open communication, mutually satisfying sexual intimacy, and enjoyable time for companionship within the relationship. Objective Identify problems and strengths in the relationship, including one's own role in each. Target Date: 2022-05-03 Frequency: Biweekly  Progress: 0 Modality: individual  Related Interventions Assess current, ongoing problems in the relationship, including possible abuse/neglect, substance use, communication, conflict resolution, as well as home environment (if domestic violence is present, plan for safety and avoid early use of conjoint sessions; see the Physical Abuse chapter in The Couples Psychotherapy Treatment Planner by Maudie Mercury, and Jongsma). Assess strengths in the relationship that could be enhanced during the therapy to facilitate the accomplishment of therapeutic goals. Objective Increase the frequency of the direct expression of honest, respectful, and positive feelings and thoughts within the relationship. Target Date: 2022-05-03 Frequency: Biweekly  Progress: 0 Modality: individual  Related Interventions Use behavioral techniques (education, modeling, role-playing, corrective feedback, and positive reinforcement) to teach communication skills including assertive communication, offering positive feedback, active listening, making positive requests of others for behavior change, and giving negative feedback in an honest and respectful manner. 5. Elevate self-esteem. 6. Enhance ability to effectively cope with the full variety of life's worries and anxieties. Objective Discuss and resolve issues related to parentification. Target Date: 2022-05-03 Frequency: Biweekly  Progress: 0 Modality: individual  Objective Learn to accept limitations in life and commit to tolerating, rather than avoiding, unpleasant  emotions while accomplishing meaningful goals. Target Date: 2022-05-03 Frequency: Biweekly  Progress: 0 Modality: individual  Objective Learn and implement personal and interpersonal skills to reduce anxiety and improve interpersonal relationships. Target Date: 2022-05-03 Frequency: Biweekly  Progress: 0 Modality: individual  Objective Identify and engage in pleasant activities on a daily basis. Target Date: 2022-05-03 Frequency: Biweekly  Progress: 0 Modality: individual  Objective Identify, challenge, and replace biased, fearful self-talk with positive, realistic, and empowering self-talk. Target Date: 2022-05-03 Frequency: Biweekly  Progress: 0 Modality: individual  Objective Verbalize an understanding of the role that cognitive biases play in excessive irrational worry and persistent anxiety symptoms. Target Date: 2022-05-03 Frequency: Biweekly  Progress: 0 Modality: individual  Objective Describe situations, thoughts, feelings, and actions associated with anxieties and worries, their impact on functioning, and attempts to resolve them. Target Date: 2022-05-03 Frequency: Biweekly  Progress: 0 Modality: individual  7. Establish an inward sense of self-worth, confidence, and competence. 8. Improve ability to communicate openly and honestly without fear of retribution. 9. Increased positive thinking related to successful conception. 10. Interact socially without undue distress or disability. Objective Positively acknowledge verbal compliments from others. Target Date: 2022-05-03 Frequency: Biweekly  Progress: 0 Modality: individual  Objective Demonstrate an increased ability to identify and express personal feelings. Target Date: 2022-05-03 Frequency: Biweekly  Progress: 0 Modality: individual  Objective Increase eye contact and interaction with others. Target Date: 2022-05-03 Frequency: Biweekly  Progress: 0 Modality: individual  Objective Identify and engage in activities  that would improve self-image by being consistent with one's values. Target Date: 2022-05-03 Frequency: Biweekly  Progress: 0 Modality: individual  Objective Decrease the verbalized fear of rejection while increasing statements of self-acceptance. Target Date: 2022-05-03 Frequency: Biweekly  Progress: 0 Modality: individual  Objective Increase insight  into the historical and current sources of low self-esteem. Target Date: 2022-05-03 Frequency: Biweekly  Progress: 0 Modality: individual  11. Learn and implement coping skills that result in a reduction of anxiety and worry, and improved daily functioning. Objective Learn and implement calming skills to reduce overall anxiety and manage anxiety symptoms. Target Date: 2022-05-03 Frequency: Biweekly  Progress: 0 Modality: individual  Related Interventions Teach the client calming/relaxation skills (e.g., applied relaxation, progressive muscle relaxation, cue-controlled relaxation; mindful breathing; biofeedback) and how to discriminate better between relaxation and tension; teach the client how to apply these skills to his/her daily life (e.g., New Directions in Progressive Muscle Relaxation by Casper Harrison, and Hazlett-Stevens; Treating Generalized Anxiety Disorder by Rygh and Amparo Bristol). 12. Reach a level of reduced tension, increased satisfaction, and improved communication with family and/or other authority figures. Objective Develop conflict resolution skills in order to effectively handle conflict. Target Date: 2022-05-03 Frequency: Biweekly  Progress: 0 Modality: individual  Objective Identify own as well as others' role in the family conflicts. Target Date: 2022-05-03 Frequency: Biweekly  Progress: 0 Modality: individual  Objective Describe the conflicts and the causes of conflicts between self and parents. Target Date: 2022-05-03 Frequency: Biweekly  Progress: 0 Modality: individual  13. To improve healthy boundaries in  personal relationships.  Patient participated in developing and has approved this treatment plan.  Diagnosis:Adjustment disorder with anxiety  Plan:  -pt to forward times to therapist for next session.  Francie Massing, [place], [facility]

## 2021-07-27 ENCOUNTER — Encounter (INDEPENDENT_AMBULATORY_CARE_PROVIDER_SITE_OTHER): Payer: Self-pay | Admitting: Physician Assistant

## 2021-07-27 NOTE — Telephone Encounter (Signed)
LOV w/ Linus Orn

## 2021-08-04 DIAGNOSIS — N912 Amenorrhea, unspecified: Secondary | ICD-10-CM | POA: Diagnosis not present

## 2021-08-17 ENCOUNTER — Other Ambulatory Visit (HOSPITAL_COMMUNITY): Payer: Self-pay

## 2021-08-20 ENCOUNTER — Ambulatory Visit (INDEPENDENT_AMBULATORY_CARE_PROVIDER_SITE_OTHER): Payer: 59 | Admitting: Physician Assistant

## 2021-08-20 ENCOUNTER — Encounter (INDEPENDENT_AMBULATORY_CARE_PROVIDER_SITE_OTHER): Payer: Self-pay | Admitting: Physician Assistant

## 2021-08-20 ENCOUNTER — Other Ambulatory Visit: Payer: Self-pay

## 2021-08-20 VITALS — BP 124/78 | HR 73 | Temp 97.9°F | Ht 69.0 in | Wt 293.0 lb

## 2021-08-20 DIAGNOSIS — R7303 Prediabetes: Secondary | ICD-10-CM

## 2021-08-20 DIAGNOSIS — Z6841 Body Mass Index (BMI) 40.0 and over, adult: Secondary | ICD-10-CM

## 2021-08-20 DIAGNOSIS — E669 Obesity, unspecified: Secondary | ICD-10-CM | POA: Diagnosis not present

## 2021-08-20 DIAGNOSIS — Z3490 Encounter for supervision of normal pregnancy, unspecified, unspecified trimester: Secondary | ICD-10-CM

## 2021-08-20 NOTE — Progress Notes (Signed)
Chief Complaint:   OBESITY [name] is here to discuss her progress with her obesity treatment plan along with follow-up of her obesity related diagnoses. [name] is on keeping a food journal and adhering to recommended goals of 1800-2200 calories and 120 grams of protein and states she is following her eating plan approximately 60% of the time. [name] states she is doing cardio and walking for 30 minutes 2 times per week.  Today's visit was #: 49 Starting weight: 363 lbs Starting date: 11/02/2017 Today's weight: 293 lbs Today's date: 08/20/2021 Total lbs lost to date: 70 lbs Total lbs lost since last in-office visit: 0  Interim History: [name] has been very nauseous the past week due to her pregnancy. She notes some protein aversion recently as well. She is trying to keep her carbohydrates < than 150 and supplements with protein shakes or drinking milk. Some days she is struggling to get to 2000 calories.   Subjective:   1. Pre-diabetes [name] is on Metformin currently and she is tolerating Metformin well. She increased to twice daily due to carbohydrates cravings. She will discuss with her OBGYN.   2. Pregnancy, unspecified gestational age [name] is [redacted] weeks pregnant. She will see her OBGYN on March 23rd.   Assessment/Plan:   1. Pre-diabetes [name] will follow up with her OBGYN. She will continue to work on weight loss, exercise, and decreasing simple carbohydrates to help decrease the risk of diabetes.   2. Pregnancy, unspecified gestational age [name] will follow up with her OBGYN.   3. Obesity with current BMI of 42.36 [name] is currently in the action stage of change. As such, her goal is to continue with weight loss efforts. She has agreed to keeping a food journal and adhering to recommended goals of 2000-2200 calories and 015 grams of  protein daily.   Exercise goals:  As is.  Behavioral modification strategies: meal planning and cooking strategies and keeping  healthy foods in the home.  [name] has agreed to follow-up with our clinic in 4 weeks. She was informed of the importance of frequent follow-up visits to maximize her success with intensive lifestyle modifications for her multiple health conditions.   Objective:   Blood pressure 124/78, pulse 73, temperature 97.9 F (36.6 C), height 5\' 9"  (1.753 m), weight 293 lb (132.9 kg), SpO2 100 %. Body mass index is 43.27 kg/m.  General: Cooperative, alert, well developed, in no acute distress. HEENT: Conjunctivae and lids unremarkable. Cardiovascular: Regular rhythm.  Lungs: Normal work of breathing. Neurologic: No focal deficits.   Lab Results  Component Value Date   CREATININE 0.70 03/17/2021   BUN 19 03/17/2021   NA 140 03/17/2021   K 4.9 03/17/2021   CL 104 03/17/2021   CO2 23 03/17/2021   Lab Results  Component Value Date   ALT 20 03/17/2021   AST 13 03/17/2021   ALKPHOS 59 03/17/2021   BILITOT 0.3 03/17/2021   Lab Results  Component Value Date   HGBA1C 5.3 03/17/2021   HGBA1C 4.9 10/27/2020   HGBA1C 5.2 07/15/2020   HGBA1C 5.3 01/15/2020   HGBA1C 5.2 04/23/2019   Lab Results  Component Value Date   INSULIN 11.1 03/17/2021   INSULIN 5.3 07/15/2020   INSULIN 12.6 01/15/2020   INSULIN 14.6 04/23/2019   INSULIN 9.0 01/02/2019   Lab Results  Component Value Date   TSH 1.41 11/30/2018   Lab Results  Component Value Date   CHOL 208 (H) 03/17/2021   HDL 58 03/17/2021  LDLCALC 130 (H) 03/17/2021   TRIG 111 03/17/2021   CHOLHDL 3.6 03/17/2021   Lab Results  Component Value Date   VD25OH 76.6 03/17/2021   VD25OH 49.8 07/15/2020   VD25OH 51.3 01/15/2020   Lab Results  Component Value Date   WBC 6.7 05/22/2019   HGB 13.0 05/22/2019   HCT 39.8 05/22/2019   MCV 86.4 05/22/2019   PLT 268.0 05/22/2019   Lab Results  Component Value Date   IRON 30 11/02/2017   TIBC 337 11/02/2017   FERRITIN 28 11/02/2017   Attestation Statements:   Reviewed by clinician  on day of visit: allergies, medications, problem list, medical history, surgical history, family history, social history, and previous encounter notes.  Time spent on visit including pre-visit chart review and post-visit care and charting was 30 minutes.   I, Tonye Pearson, am acting as Location manager for [employer], PA-C.  I have reviewed the above documentation for accuracy and completeness, and I agree with the above. Abby Potash, PA-C

## 2021-08-25 ENCOUNTER — Other Ambulatory Visit: Payer: Self-pay | Admitting: Family Medicine

## 2021-08-25 ENCOUNTER — Other Ambulatory Visit (HOSPITAL_COMMUNITY): Payer: Self-pay

## 2021-08-26 ENCOUNTER — Other Ambulatory Visit (HOSPITAL_COMMUNITY): Payer: Self-pay

## 2021-08-26 NOTE — Telephone Encounter (Signed)
Rx looks to be d/c by OB. Please advise.  ?

## 2021-08-28 ENCOUNTER — Encounter (INDEPENDENT_AMBULATORY_CARE_PROVIDER_SITE_OTHER): Payer: Self-pay | Admitting: Physician Assistant

## 2021-08-28 ENCOUNTER — Other Ambulatory Visit (HOSPITAL_COMMUNITY): Payer: Self-pay

## 2021-08-28 DIAGNOSIS — R7303 Prediabetes: Secondary | ICD-10-CM

## 2021-08-29 ENCOUNTER — Other Ambulatory Visit (HOSPITAL_COMMUNITY): Payer: Self-pay

## 2021-08-31 ENCOUNTER — Other Ambulatory Visit (HOSPITAL_COMMUNITY): Payer: Self-pay

## 2021-08-31 NOTE — Telephone Encounter (Signed)
Pt last seen by Tracey Aguilar, PA-C.  

## 2021-08-31 NOTE — Telephone Encounter (Signed)
LAST APPOINTMENT DATE: 08/20/21 ?NEXT APPOINTMENT DATE: 09/21/21 ? ? ?[facility] ?[address] ?[place] [address] ?Phone: [phone] Fax: [phone] ? ?Patient is requesting a refill of the following medications: ?Requested Prescriptions  ? ? No prescriptions requested or ordered in this encounter  ? ? ?Date last filled: it was a no print, not sent to the pharmacy ?Previously prescribed by Abby Potash ? ?Lab Results  ?Component Value Date  ? HGBA1C 5.3 03/17/2021  ? HGBA1C 4.9 10/27/2020  ? HGBA1C 5.2 07/15/2020  ? ?Lab Results  ?Component Value Date  ? LDLCALC 130 (H) 03/17/2021  ? CREATININE 0.70 03/17/2021  ? ?Lab Results  ?Component Value Date  ? VD25OH 76.6 03/17/2021  ? VD25OH 49.8 07/15/2020  ? VD25OH 51.3 01/15/2020  ? ? ?BP Readings from Last 3 Encounters:  ?08/20/21 124/78  ?07/20/21 106/70  ?07/06/21 117/80  ? ? ?

## 2021-09-01 ENCOUNTER — Other Ambulatory Visit (HOSPITAL_COMMUNITY): Payer: Self-pay

## 2021-09-01 ENCOUNTER — Other Ambulatory Visit (INDEPENDENT_AMBULATORY_CARE_PROVIDER_SITE_OTHER): Payer: Self-pay | Admitting: Physician Assistant

## 2021-09-01 DIAGNOSIS — R7303 Prediabetes: Secondary | ICD-10-CM

## 2021-09-01 MED ORDER — METFORMIN HCL ER 500 MG PO TB24
500.0000 mg | ORAL_TABLET | Freq: Every day | ORAL | 0 refills | Status: DC
  Filled 2021-09-01: qty 30, 30d supply, fill #0

## 2021-09-04 DIAGNOSIS — O09811 Supervision of pregnancy resulting from assisted reproductive technology, first trimester: Secondary | ICD-10-CM | POA: Diagnosis not present

## 2021-09-07 DIAGNOSIS — Q8789 Other specified congenital malformation syndromes, not elsewhere classified: Secondary | ICD-10-CM | POA: Diagnosis not present

## 2021-09-07 DIAGNOSIS — O09811 Supervision of pregnancy resulting from assisted reproductive technology, first trimester: Secondary | ICD-10-CM | POA: Diagnosis not present

## 2021-09-07 DIAGNOSIS — A5401 Gonococcal cystitis and urethritis, unspecified: Secondary | ICD-10-CM | POA: Diagnosis not present

## 2021-09-10 ENCOUNTER — Ambulatory Visit (INDEPENDENT_AMBULATORY_CARE_PROVIDER_SITE_OTHER): Payer: 59 | Admitting: Professional

## 2021-09-10 ENCOUNTER — Encounter: Payer: Self-pay | Admitting: Professional

## 2021-09-10 DIAGNOSIS — O09811 Supervision of pregnancy resulting from assisted reproductive technology, first trimester: Secondary | ICD-10-CM | POA: Diagnosis not present

## 2021-09-10 DIAGNOSIS — F4322 Adjustment disorder with anxiety: Secondary | ICD-10-CM | POA: Diagnosis not present

## 2021-09-10 DIAGNOSIS — O09511 Supervision of elderly primigravida, first trimester: Secondary | ICD-10-CM | POA: Diagnosis not present

## 2021-09-10 DIAGNOSIS — E282 Polycystic ovarian syndrome: Secondary | ICD-10-CM | POA: Diagnosis not present

## 2021-09-10 DIAGNOSIS — Q8789 Other specified congenital malformation syndromes, not elsewhere classified: Secondary | ICD-10-CM | POA: Diagnosis not present

## 2021-09-10 DIAGNOSIS — Z6841 Body Mass Index (BMI) 40.0 and over, adult: Secondary | ICD-10-CM | POA: Diagnosis not present

## 2021-09-10 DIAGNOSIS — O99211 Obesity complicating pregnancy, first trimester: Secondary | ICD-10-CM | POA: Diagnosis not present

## 2021-09-10 LAB — OB RESULTS CONSOLE HIV ANTIBODY (ROUTINE TESTING): HIV: NONREACTIVE

## 2021-09-10 LAB — OB RESULTS CONSOLE RUBELLA ANTIBODY, IGM: Rubella: IMMUNE

## 2021-09-10 LAB — OB RESULTS CONSOLE GC/CHLAMYDIA: Chlamydia: NEGATIVE

## 2021-09-10 LAB — OB RESULTS CONSOLE RPR: RPR: NONREACTIVE

## 2021-09-10 LAB — HEPATITIS C ANTIBODY: HCV Ab: NEGATIVE

## 2021-09-10 LAB — OB RESULTS CONSOLE ABO/RH: RH Type: POSITIVE

## 2021-09-10 LAB — RESULTS CONSOLE HPV: CHL HPV: NEGATIVE

## 2021-09-10 LAB — OB RESULTS CONSOLE HEPATITIS B SURFACE ANTIGEN: Hepatitis B Surface Ag: NEGATIVE

## 2021-09-10 LAB — HM PAP SMEAR

## 2021-09-10 LAB — OB RESULTS CONSOLE ANTIBODY SCREEN: Antibody Screen: NEGATIVE

## 2021-09-10 NOTE — Progress Notes (Signed)
[facility] Counselor/Therapist Progress Note ? ?Patient ID: [name], MRN: [id number],   ? ?Date: 09/10/2021 ? ?Time Spent: 51 minutes 9-951 am ? ?Treatment Type: Individual Therapy ? ?Risk Assessment: ?Danger to Self:  No ?Self-injurious Behavior: No ?Danger to Others: No ? ?Subjective: This session was held via video teletherapy due to the coronavirus risk at this time. The patient consented to video teletherapy and was located at her home during this session. She is aware it is the responsibility of the patient to secure confidentiality on her end of the session. The provider was in a private home office for the duration of this session.  ? ?The patient arrived on time for her webex appointment. ? ?Issues addressed: ?1-pregnancy ?-tomorrow she will be at 12 weeks and through the first trimester ?-a little bit of nausea ?-significant fatigue ?  -work days take a lot out of her ?  -her new bedtime is 8pm ?  -she is now off for three days ?2-mood ?-at ten weeks the mood swings kicked in ?-she is quickly moving from clam to irritable ?  -it is generally direct at her husband [name] ?  -things are getting better with [name] accepting ?  -some people at work have noticed that she is more tired and less sociable ?  -[name] is more in tune to her emotions ?3-marital ?-[name] is struggling with the pregnancy somewhat due to anxiety and all the things to come ?  -he snowballs a bit and uses ineffective coping ?  -pt is very in tune to him and checks in with him ?  -pt has not noticed as much with him until they had an argument with him the other day ?  -he tends to pick a fight to have a discussion ?-lack of sex during first trimester ?  -libido non-existent ?  -she was nauseous and fatigued ?  -she has shared it is more responsive and asked her to have sex does not work for her ?    -her "not being turned on" turned into a fight ?    -they separated and collected themselves ?      -he is worried about the  pregnancy ?     -he is worried about weight gain ?     -his coworker is talking about problems she is having in her relationships ?     -he always says he feels better after an argument ?      -her anxiety grows when he emotionally dumps ?-checking in ?  -he has been placed back on a 6a-6p schedule and that has been better ?   -husband has not had a set ?3-work-life balance ?-she worries about mom-wife roles ?-how to keep relationship with spouse healthy ?-one time per month planning a date night ?  -getting into habits of regular day nights prior to baby's birth ?-daily check-ins ?-hopes to be able to stagger schedule with spouse to avoid daycare ?-pt is learning from other mom friends ?4-family constellation ?-her stepfather over-prioritized her stepfather ?  -she was not a very present mom ?  -mom worked night shift ?  -limited finances ?  -four children ?    -next present for important events ?-pt has had more contact with her mother in law than her mother until Nicole Kindred passed ?  -she now has lunch dates with her mother ?-her mother struggled with her cheating on her father with her stepfather ?  -she had to put everything  into that relationship to prove everyone wrong ? ?Goals ?1. Achieve a reasonable level of family connectedness and harmony where members support, help, and are concerned for each other. ?2. Begin a healthy grieving process around the loss. ?Objective ?Identify how avoiding dealing with loss has negatively impacted life. ?Target Date: 2022-05-03 Frequency: Biweekly  ?Progress: 0 Modality: individual  ?Related Interventions ?New intervention ?Ask the client to list ways that avoidance of grieving has negatively impacted his/her life. ?Objective ?Identify what stages of grief have been experienced in the continuum of the grieving process. ?Target Date: 2022-05-03 Frequency: Biweekly  ?Progress: 0 Modality: individual  ?Related Interventions ?Assist the client in identifying the stages of grief that  he/she has experienced and which stage he/she is presently working through. ?Educate the client on the stages of the grieving process and answer any questions he/she may have. ?3. Develop an awareness of how the avoidance of grieving has affected life and begin the healing process. ?Objective ?Verbalize resolution of feelings of guilt and regret associated with the loss. ?Target Date: 2022-05-03 Frequency: Biweekly  ?Progress: 0 Modality: individual  ?Objective ?Verbalize and resolve feelings of anger or guilt focused on self or deceased loved one that interfere with the grieving process. ?Target Date: 2022-05-03 Frequency: Biweekly  ?Progress: 0 Modality: individual  ?Objective ?Begin verbalizing feelings associated with the loss. ?Target Date: 2022-05-03 Frequency: Biweekly  ?Progress: 0 Modality: individual  ?Objective ?Tell in detail the story of the current loss that is triggering symptoms. ?Target Date: 2022-05-03 Frequency: Biweekly  ?Progress: 0 Modality: individual  ?4. Develop the necessary skills for effective, open communication, mutually satisfying sexual intimacy, and enjoyable time for companionship within the relationship. ?Objective ?Identify problems and strengths in the relationship, including one's own role in each. ?Target Date: 2022-05-03 Frequency: Biweekly  ?Progress: 0 Modality: individual  ?Related Interventions ?Assess current, ongoing problems in the relationship, including possible abuse/neglect, substance use, communication, conflict resolution, as well as home environment (if domestic violence is present, plan for safety and avoid early use of conjoint sessions; see the Physical Abuse chapter in The Couples Psychotherapy Treatment Planner by Maudie Mercury, and Jongsma). ?Assess strengths in the relationship that could be enhanced during the therapy to facilitate the accomplishment of therapeutic goals. ?Objective ?Increase the frequency of the direct expression of honest, respectful,  and positive feelings and thoughts within the relationship. ?Target Date: 2022-05-03 Frequency: Biweekly  ?Progress: 0 Modality: individual  ?Related Interventions ?Use behavioral techniques (education, modeling, role-playing, corrective feedback, and positive reinforcement) to teach communication skills including assertive communication, offering positive feedback, active listening, making positive requests of others for behavior change, and giving negative feedback in an honest and respectful manner. ?5. Elevate self-esteem. ?6. Enhance ability to effectively cope with the full variety of life's worries and anxieties. ?Objective ?Discuss and resolve issues related to parentification. ?Target Date: 2022-05-03 Frequency: Biweekly  ?Progress: 0 Modality: individual  ?Objective ?Learn to accept limitations in life and commit to tolerating, rather than avoiding, unpleasant emotions while accomplishing meaningful goals. ?Target Date: 2022-05-03 Frequency: Biweekly  ?Progress: 0 Modality: individual  ?Objective ?Learn and implement personal and interpersonal skills to reduce anxiety and improve interpersonal relationships. ?Target Date: 2022-05-03 Frequency: Biweekly  ?Progress: 0 Modality: individual  ?Objective ?Identify and engage in pleasant activities on a daily basis. ?Target Date: 2022-05-03 Frequency: Biweekly  ?Progress: 0 Modality: individual  ?Objective ?Identify, challenge, and replace biased, fearful self-talk with positive, realistic, and empowering self-talk. ?Target Date: 2022-05-03 Frequency: Biweekly  ?Progress: 0 Modality: individual  ?Objective ?  Verbalize an understanding of the role that cognitive biases play in excessive irrational worry and persistent anxiety symptoms. ?Target Date: 2022-05-03 Frequency: Biweekly  ?Progress: 0 Modality: individual  ?Objective ?Describe situations, thoughts, feelings, and actions associated with anxieties and worries, their impact on functioning, and attempts to  resolve them. ?Target Date: 2022-05-03 Frequency: Biweekly  ?Progress: 0 Modality: individual  ?7. Establish an inward sense of self-worth, confidence, and competence. ?8. Improve ability to communicate

## 2021-09-11 ENCOUNTER — Other Ambulatory Visit: Payer: Self-pay | Admitting: Obstetrics and Gynecology

## 2021-09-11 DIAGNOSIS — N6320 Unspecified lump in the left breast, unspecified quadrant: Secondary | ICD-10-CM

## 2021-09-21 ENCOUNTER — Ambulatory Visit (INDEPENDENT_AMBULATORY_CARE_PROVIDER_SITE_OTHER): Payer: 59 | Admitting: Physician Assistant

## 2021-09-21 ENCOUNTER — Encounter (INDEPENDENT_AMBULATORY_CARE_PROVIDER_SITE_OTHER): Payer: Self-pay | Admitting: Physician Assistant

## 2021-09-21 VITALS — BP 110/71 | HR 73 | Temp 98.3°F | Ht 69.0 in | Wt 296.0 lb

## 2021-09-21 DIAGNOSIS — R7303 Prediabetes: Secondary | ICD-10-CM

## 2021-09-21 DIAGNOSIS — E669 Obesity, unspecified: Secondary | ICD-10-CM

## 2021-09-21 DIAGNOSIS — Z6841 Body Mass Index (BMI) 40.0 and over, adult: Secondary | ICD-10-CM | POA: Diagnosis not present

## 2021-09-22 NOTE — Progress Notes (Signed)
? ? ? ?Chief Complaint:  ? ?OBESITY ?[name] is here to discuss her progress with her obesity treatment plan along with follow-up of her obesity related diagnoses. [name] is on keeping a food journal and adhering to recommended goals of 2000-2200 calories and 150 grams of protein and states she is following her eating plan approximately 75% of the time. [name] states she is doing cardio 30 minutes 1-2 times per week. ? ?Today's visit was #: 45 ?Starting weight: 363 lbs ?Starting date: 11/02/2017 ?Today's weight: 296 lbs ?Today's date: 09/21/2021 ?Total lbs lost to date: 67 lbs ?Total lbs lost since last in-office visit: 0 ? ?Interim History: [name] will be [redacted] weeks pregnant in 4 days. She is nauseous if not eating every few hours. She has been journaling more often the last week. She is following up with her OBGYN regularly.  ? ?Subjective:  ? ?1. Pre-diabetes ?[name] spoke with her OB/GYN regarding stopping her Metformin. Her hunger is controlled. Her last A1C 5.3. ? ?Assessment/Plan:  ? ?1. Pre-diabetes ?[name] will follow up with OB/GYN regarding Metformin. She will continue to work on weight loss, exercise, and decreasing simple carbohydrates to help decrease the risk of diabetes.  ? ?2. Obesity with current BMI of 43.69 ?[name] is currently in the action stage of change. As such, her goal is to continue with weight loss efforts. She has agreed to keeping a food journal and adhering to recommended goals of 2000-2200 calories and 150 grams of protein.  ? ?We will get fasting labs next visit. ? ?Exercise goals:  As is. ? ?Behavioral modification strategies: meal planning and cooking strategies and planning for success. ? ?Karnisha has agreed to follow-up with our clinic in 5 weeks. She was informed of the importance of frequent follow-up visits to maximize her success with intensive lifestyle modifications for her multiple health conditions.  ? ?Objective:  ? ?Blood pressure 110/71, pulse 73, temperature 98.3  ?F (36.8 ?C), height '5\' 9"'$  (1.753 m), weight 296 lb (134.3 kg), last menstrual period 04/15/2021, SpO2 100 %. ?Body mass index is 43.71 kg/m?. ? ?General: Cooperative, alert, well developed, in no acute distress. ?HEENT: Conjunctivae and lids unremarkable. ?Cardiovascular: Regular rhythm.  ?Lungs: Normal work of breathing. ?Neurologic: No focal deficits.  ? ?Lab Results  ?Component Value Date  ? CREATININE 0.70 03/17/2021  ? BUN 19 03/17/2021  ? NA 140 03/17/2021  ? K 4.9 03/17/2021  ? CL 104 03/17/2021  ? CO2 23 03/17/2021  ? ?Lab Results  ?Component Value Date  ? ALT 20 03/17/2021  ? AST 13 03/17/2021  ? ALKPHOS 59 03/17/2021  ? BILITOT 0.3 03/17/2021  ? ?Lab Results  ?Component Value Date  ? HGBA1C 5.3 03/17/2021  ? HGBA1C 4.9 10/27/2020  ? HGBA1C 5.2 07/15/2020  ? HGBA1C 5.3 01/15/2020  ? HGBA1C 5.2 04/23/2019  ? ?Lab Results  ?Component Value Date  ? INSULIN 11.1 03/17/2021  ? INSULIN 5.3 07/15/2020  ? INSULIN 12.6 01/15/2020  ? INSULIN 14.6 04/23/2019  ? INSULIN 9.0 01/02/2019  ? ?Lab Results  ?Component Value Date  ? TSH 1.41 11/30/2018  ? ?Lab Results  ?Component Value Date  ? CHOL 208 (H) 03/17/2021  ? HDL 58 03/17/2021  ? LDLCALC 130 (H) 03/17/2021  ? TRIG 111 03/17/2021  ? CHOLHDL 3.6 03/17/2021  ? ?Lab Results  ?Component Value Date  ? VD25OH 76.6 03/17/2021  ? VD25OH 49.8 07/15/2020  ? VD25OH 51.3 01/15/2020  ? ?Lab Results  ?Component Value Date  ? WBC 6.7 05/22/2019  ?  HGB 13.0 05/22/2019  ? HCT 39.8 05/22/2019  ? MCV 86.4 05/22/2019  ? PLT 268.0 05/22/2019  ? ?Lab Results  ?Component Value Date  ? IRON 30 11/02/2017  ? TIBC 337 11/02/2017  ? FERRITIN 28 11/02/2017  ? ?Attestation Statements:  ? ?Reviewed by clinician on day of visit: allergies, medications, problem list, medical history, surgical history, family history, social history, and previous encounter notes. ? ?Time spent on visit including pre-visit chart review and post-visit care and charting was 34 minutes.  ? ?I, [name], am  acting as Location manager for [employer], PA-C. ? ?I have reviewed the above documentation for accuracy and completeness, and I agree with the above. Vanessa Potash, PA-C ? ?

## 2021-09-23 ENCOUNTER — Ambulatory Visit: Payer: 59 | Admitting: Professional

## 2021-10-08 ENCOUNTER — Ambulatory Visit
Admission: RE | Admit: 2021-10-08 | Discharge: 2021-10-08 | Disposition: A | Discharge status: Home / Self Care | Payer: 59 | Source: Ambulatory Visit | Attending: Obstetrics and Gynecology | Admitting: Obstetrics and Gynecology

## 2021-10-08 ENCOUNTER — Other Ambulatory Visit: Payer: Self-pay | Admitting: Obstetrics and Gynecology

## 2021-10-08 DIAGNOSIS — N6489 Other specified disorders of breast: Secondary | ICD-10-CM | POA: Diagnosis not present

## 2021-10-08 DIAGNOSIS — N6320 Unspecified lump in the left breast, unspecified quadrant: Secondary | ICD-10-CM

## 2021-10-08 DIAGNOSIS — R922 Inconclusive mammogram: Secondary | ICD-10-CM | POA: Diagnosis not present

## 2021-10-13 ENCOUNTER — Ambulatory Visit: Payer: 59 | Admitting: Professional

## 2021-10-13 DIAGNOSIS — O09512 Supervision of elderly primigravida, second trimester: Secondary | ICD-10-CM | POA: Diagnosis not present

## 2021-10-13 DIAGNOSIS — O99212 Obesity complicating pregnancy, second trimester: Secondary | ICD-10-CM | POA: Diagnosis not present

## 2021-10-13 DIAGNOSIS — Z3689 Encounter for other specified antenatal screening: Secondary | ICD-10-CM | POA: Diagnosis not present

## 2021-10-13 DIAGNOSIS — Q8789 Other specified congenital malformation syndromes, not elsewhere classified: Secondary | ICD-10-CM | POA: Diagnosis not present

## 2021-10-13 DIAGNOSIS — Z3201 Encounter for pregnancy test, result positive: Secondary | ICD-10-CM | POA: Diagnosis not present

## 2021-10-13 DIAGNOSIS — E2839 Other primary ovarian failure: Secondary | ICD-10-CM | POA: Diagnosis not present

## 2021-10-13 DIAGNOSIS — Z361 Encounter for antenatal screening for raised alphafetoprotein level: Secondary | ICD-10-CM | POA: Diagnosis not present

## 2021-10-13 DIAGNOSIS — O09812 Supervision of pregnancy resulting from assisted reproductive technology, second trimester: Secondary | ICD-10-CM | POA: Diagnosis not present

## 2021-10-26 ENCOUNTER — Ambulatory Visit (INDEPENDENT_AMBULATORY_CARE_PROVIDER_SITE_OTHER): Payer: 59 | Admitting: Physician Assistant

## 2021-10-27 ENCOUNTER — Encounter (INDEPENDENT_AMBULATORY_CARE_PROVIDER_SITE_OTHER): Payer: Self-pay | Admitting: Family Medicine

## 2021-10-27 ENCOUNTER — Ambulatory Visit (INDEPENDENT_AMBULATORY_CARE_PROVIDER_SITE_OTHER): Payer: 59 | Admitting: Family Medicine

## 2021-10-27 VITALS — BP 116/72 | HR 68 | Temp 98.3°F | Ht 69.0 in | Wt 302.0 lb

## 2021-10-27 DIAGNOSIS — Z9189 Other specified personal risk factors, not elsewhere classified: Secondary | ICD-10-CM

## 2021-10-27 DIAGNOSIS — Z6841 Body Mass Index (BMI) 40.0 and over, adult: Secondary | ICD-10-CM

## 2021-10-27 DIAGNOSIS — R7303 Prediabetes: Secondary | ICD-10-CM | POA: Diagnosis not present

## 2021-10-27 DIAGNOSIS — E669 Obesity, unspecified: Secondary | ICD-10-CM

## 2021-10-27 DIAGNOSIS — E559 Vitamin D deficiency, unspecified: Secondary | ICD-10-CM | POA: Diagnosis not present

## 2021-10-27 DIAGNOSIS — Z3490 Encounter for supervision of normal pregnancy, unspecified, unspecified trimester: Secondary | ICD-10-CM

## 2021-10-28 ENCOUNTER — Other Ambulatory Visit (INDEPENDENT_AMBULATORY_CARE_PROVIDER_SITE_OTHER): Payer: Self-pay | Admitting: Family Medicine

## 2021-10-28 ENCOUNTER — Encounter (INDEPENDENT_AMBULATORY_CARE_PROVIDER_SITE_OTHER): Payer: Self-pay | Admitting: Family Medicine

## 2021-10-28 DIAGNOSIS — E559 Vitamin D deficiency, unspecified: Secondary | ICD-10-CM

## 2021-10-28 LAB — VITAMIN D 25 HYDROXY (VIT D DEFICIENCY, FRACTURES): Vit D, 25-Hydroxy: 53.7 ng/mL (ref 30.0–100.0)

## 2021-10-28 MED ORDER — VITAMIN D3 50 MCG (2000 UT) PO CAPS: ORAL_CAPSULE | ORAL | Status: AC

## 2021-10-28 NOTE — Telephone Encounter (Signed)
FYI

## 2021-10-28 NOTE — Telephone Encounter (Signed)
Dr.Opalski ?

## 2021-11-05 ENCOUNTER — Ambulatory Visit (INDEPENDENT_AMBULATORY_CARE_PROVIDER_SITE_OTHER): Payer: 59 | Admitting: Professional

## 2021-11-05 ENCOUNTER — Encounter: Payer: Self-pay | Admitting: Professional

## 2021-11-05 DIAGNOSIS — O09812 Supervision of pregnancy resulting from assisted reproductive technology, second trimester: Secondary | ICD-10-CM | POA: Diagnosis not present

## 2021-11-05 DIAGNOSIS — F4322 Adjustment disorder with anxiety: Secondary | ICD-10-CM | POA: Diagnosis not present

## 2021-11-05 DIAGNOSIS — O99212 Obesity complicating pregnancy, second trimester: Secondary | ICD-10-CM | POA: Diagnosis not present

## 2021-11-05 DIAGNOSIS — O09512 Supervision of elderly primigravida, second trimester: Secondary | ICD-10-CM | POA: Diagnosis not present

## 2021-11-05 DIAGNOSIS — O0992 Supervision of high risk pregnancy, unspecified, second trimester: Secondary | ICD-10-CM | POA: Diagnosis not present

## 2021-11-05 DIAGNOSIS — Z3689 Encounter for other specified antenatal screening: Secondary | ICD-10-CM | POA: Diagnosis not present

## 2021-11-05 DIAGNOSIS — Q8789 Other specified congenital malformation syndromes, not elsewhere classified: Secondary | ICD-10-CM | POA: Diagnosis not present

## 2021-11-05 DIAGNOSIS — O43192 Other malformation of placenta, second trimester: Secondary | ICD-10-CM | POA: Diagnosis not present

## 2021-11-05 NOTE — Progress Notes (Signed)
[facility] Counselor/Therapist Progress Note  Patient ID: [name], MRN: [id number],    Date: 11/05/2021  Time Spent: 54 minutes [id number]  Treatment Type: Individual Therapy  Risk Assessment: Danger to Self:  No Self-injurious Behavior: No Danger to Others: No  Subjective: This session was held via video teletherapy. The patient consented to video teletherapy and was located at her home during this session. She is aware it is the responsibility of the patient to secure confidentiality on her end of the session. The provider was in a private home office for the duration of this session.   The patient arrived on time for her webex appointment.  Issues addressed: 1-homework -complete values self-exploration 2-pregnancy -she is now officially halfway through her pregnancy -they had the full body scan today and their baby boy looks perfectly healthy -physically pt feels good but beginning to get uncomfortable with positioning 2-mood -mood swings particularly anger stick around   -she is not able to move through it as well -no themes around her anger 3-marital -[name] and the pt met with friends who now have a six moth old   -it was helpful for them to hear what they experienced and what was normal   -[name] has decide he will be anxious until the baby is safely delivered   -he will doing a daddy boot camp -[name] struggles with his anxiety and has been using unhealthy coping skills (per pt "self indulging")   -food   -masturbation, however it is getting better     -he worked with a sex therapist for a while and has learned some coping skills -[name]'s love language is words of affirmation   -pt admits feeling  annoyed that she has to reinforcement things that he should do   -strategies for providing positive reinforcement -[name]'s mindset is in the future   -things will be so much better when I lose weight -pt's libido has improved but she and [name] were not on  the same page -her sex drive is on the higher end and about back to where she was   Goals 1. Achieve a reasonable level of family connectedness and harmony where members support, help, and are concerned for each other. 2. Begin a healthy grieving process around the loss. Objective Identify how avoiding dealing with loss has negatively impacted life. Target Date: 2022-05-03 Frequency: Biweekly  Progress: 0 Modality: individual  Related Interventions New intervention Ask the client to list ways that avoidance of grieving has negatively impacted his/her life. Objective Identify what stages of grief have been experienced in the continuum of the grieving process. Target Date: 2022-05-03 Frequency: Biweekly  Progress: 0 Modality: individual  Related Interventions Assist the client in identifying the stages of grief that he/she has experienced and which stage he/she is presently working through. Educate the client on the stages of the grieving process and answer any questions he/she may have. 3. Develop an awareness of how the avoidance of grieving has affected life and begin the healing process. Objective Verbalize resolution of feelings of guilt and regret associated with the loss. Target Date: 2022-05-03 Frequency: Biweekly  Progress: 0 Modality: individual  Objective Verbalize and resolve feelings of anger or guilt focused on self or deceased loved one that interfere with the grieving process. Target Date: 2022-05-03 Frequency: Biweekly  Progress: 0 Modality: individual  Objective Begin verbalizing feelings associated with the loss. Target Date: 2022-05-03 Frequency: Biweekly  Progress: 0 Modality: individual  Objective Tell in detail the story of the  current loss that is triggering symptoms. Target Date: 2022-05-03 Frequency: Biweekly  Progress: 0 Modality: individual  4. Develop the necessary skills for effective, open communication, mutually satisfying sexual intimacy, and  enjoyable time for companionship within the relationship. Objective Identify problems and strengths in the relationship, including one's own role in each. Target Date: 2022-05-03 Frequency: Biweekly  Progress: 0 Modality: individual  Related Interventions Assess current, ongoing problems in the relationship, including possible abuse/neglect, substance use, communication, conflict resolution, as well as home environment (if domestic violence is present, plan for safety and avoid early use of conjoint sessions; see the Physical Abuse chapter in The Couples Psychotherapy Treatment Planner by Maudie Mercury, and Jongsma). Assess strengths in the relationship that could be enhanced during the therapy to facilitate the accomplishment of therapeutic goals. Objective Increase the frequency of the direct expression of honest, respectful, and positive feelings and thoughts within the relationship. Target Date: 2022-05-03 Frequency: Biweekly  Progress: 0 Modality: individual  Related Interventions Use behavioral techniques (education, modeling, role-playing, corrective feedback, and positive reinforcement) to teach communication skills including assertive communication, offering positive feedback, active listening, making positive requests of others for behavior change, and giving negative feedback in an honest and respectful manner. 5. Elevate self-esteem. 6. Enhance ability to effectively cope with the full variety of life's worries and anxieties. Objective Discuss and resolve issues related to parentification. Target Date: 2022-05-03 Frequency: Biweekly  Progress: 0 Modality: individual  Objective Learn to accept limitations in life and commit to tolerating, rather than avoiding, unpleasant emotions while accomplishing meaningful goals. Target Date: 2022-05-03 Frequency: Biweekly  Progress: 0 Modality: individual  Objective Learn and implement personal and interpersonal skills to reduce anxiety and  improve interpersonal relationships. Target Date: 2022-05-03 Frequency: Biweekly  Progress: 0 Modality: individual  Objective Identify and engage in pleasant activities on a daily basis. Target Date: 2022-05-03 Frequency: Biweekly  Progress: 0 Modality: individual  Objective Identify, challenge, and replace biased, fearful self-talk with positive, realistic, and empowering self-talk. Target Date: 2022-05-03 Frequency: Biweekly  Progress: 0 Modality: individual  Objective Verbalize an understanding of the role that cognitive biases play in excessive irrational worry and persistent anxiety symptoms. Target Date: 2022-05-03 Frequency: Biweekly  Progress: 0 Modality: individual  Objective Describe situations, thoughts, feelings, and actions associated with anxieties and worries, their impact on functioning, and attempts to resolve them. Target Date: 2022-05-03 Frequency: Biweekly  Progress: 0 Modality: individual  7. Establish an inward sense of self-worth, confidence, and competence. 8. Improve ability to communicate openly and honestly without fear of retribution. 9. Increased positive thinking related to successful conception. 10. Interact socially without undue distress or disability. Objective Positively acknowledge verbal compliments from others. Target Date: 2022-05-03 Frequency: Biweekly  Progress: 0 Modality: individual  Objective Demonstrate an increased ability to identify and express personal feelings. Target Date: 2022-05-03 Frequency: Biweekly  Progress: 0 Modality: individual  Objective Increase eye contact and interaction with others. Target Date: 2022-05-03 Frequency: Biweekly  Progress: 0 Modality: individual  Objective Identify and engage in activities that would improve self-image by being consistent with one's values. Target Date: 2022-05-03 Frequency: Biweekly  Progress: 0 Modality: individual  Objective Decrease the verbalized fear of rejection while  increasing statements of self-acceptance. Target Date: 2022-05-03 Frequency: Biweekly  Progress: 0 Modality: individual  Objective Increase insight into the historical and current sources of low self-esteem. Target Date: 2022-05-03 Frequency: Biweekly  Progress: 0 Modality: individual  11. Learn and implement coping skills that result in a reduction of anxiety and worry, and  improved daily functioning. Objective Learn and implement calming skills to reduce overall anxiety and manage anxiety symptoms. Target Date: 2022-05-03 Frequency: Biweekly  Progress: 0 Modality: individual  Related Interventions Teach the client calming/relaxation skills (e.g., applied relaxation, progressive muscle relaxation, cue-controlled relaxation; mindful breathing; biofeedback) and how to discriminate better between relaxation and tension; teach the client how to apply these skills to his/her daily life (e.g., New Directions in Progressive Muscle Relaxation by Casper Harrison, and Hazlett-Stevens; Treating Generalized Anxiety Disorder by Rygh and Amparo Bristol). 12. Reach a level of reduced tension, increased satisfaction, and improved communication with family and/or other authority figures. Objective Develop conflict resolution skills in order to effectively handle conflict. Target Date: 2022-05-03 Frequency: Biweekly  Progress: 0 Modality: individual  Objective Identify own as well as others' role in the family conflicts. Target Date: 2022-05-03 Frequency: Biweekly  Progress: 0 Modality: individual  Objective Describe the conflicts and the causes of conflicts between self and parents. Target Date: 2022-05-03 Frequency: Biweekly  Progress: 0 Modality: individual  13. To improve healthy boundaries in personal relationships.  Patient participated in developing and has approved this treatment plan.  Diagnosis:Adjustment disorder with anxiety  Plan:  -keeping anxieties as she moves through pregnancy under  control -next session is Tuesday, November 24, 2021 at [place], [facility]

## 2021-11-07 NOTE — Progress Notes (Unsigned)
Chief Complaint:   OBESITY [name] is here to discuss her progress with her obesity treatment plan along with follow-up of her obesity related diagnoses. [name] is on keeping a food journal and adhering to recommended goals of 2000-2200 calories and 150 grams of  protein and states she is following her eating plan approximately 50% of the time. [name] states she is doing cardio 30 minutes 1-2 times per week.  Today's visit was #: 46 Starting weight: 363 lbs Starting date: 11/02/2017 Today's weight: 302 lbs Today's date: 10/27/2021 Total lbs lost to date: 61 lbs Total lbs lost since last in-office visit: 0 lbs  Interim History: [name] is [redacted] weeks pregnant and could not fast until her appointment. She state she is not journaling as much as she should. It has been difficult for her to get all her food in. 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  Subjective:   1. Pre-diabetes [name] was told, at her OB-GYN visit, that her glucose test was within normal limits and she is to hold Metformin for now. [name] reports, "my hunger is through the roof!"  2. Pregnancy, unspecified gestational age [name] sees Dr. Garwin Brothers, OB-GYN on a regular basis. [name] recently had a "bunch of labor" and Doctor aware of her prediabetes.   3. Vitamin D deficiency [name]'s Vitamin D level is was at 76, seven months ago. She is currently taking 5,000 units OTC daily.  4. At high risk for malnutrition [name] is at a higher risk of malnutrition due to pregnancy and increased need.  Assessment/Plan:   Orders Placed This Encounter  Procedures   VITAMIN D 25 Hydroxy (Vit-D Deficiency, Fractures)    Medications Discontinued During This Encounter  Medication Reason   metFORMIN (GLUCOPHAGE-XR) 500 MG 24 hr tablet      No orders of the defined types were placed in this encounter.    1. Pre-diabetes There is no need to check her A1C  or fasting insulin at this time due to pregnancy and currently under the care of OB-GYN Doctor for screening. [name] will discontinue taking Metformin.  2. Pregnancy, unspecified gestational age Care to be directed by OB-GYN Doctor during this time for blood sugar regulation and disease screening.  3. Vitamin D deficiency We will check Vitamin D level today. She will continue current supplement until results are revealed. - VITAMIN D 25 Hydroxy (Vit-D Deficiency, Fractures)  4. At high risk for malnutrition [name] was given approximately 15 minutes of counseling today regarding prevention of malnutrition and ways to meet macronutrient goals..   5. Obesity with current BMI of 44.6 [name] is currently in the action stage of change. As such, her goal is to continue with weight loss efforts. She has agreed to keeping a food journal and adhering to recommended goals of 2000-2400 calories and 130+ grams of protein. protein.   Exercise goals: As is.  Behavioral modification strategies: meal planning and cooking strategies, keeping healthy foods in the home, and planning for success.  [name] was informed we would discuss her lab results at her next visit unless there is a critical issue that needs to be addressed sooner.   [name] agreed to keep her next visit at the agreed upon time to discuss these results. [name] has agreed to follow-up with our clinic in 4 weeks. She was informed of the importance of frequent follow-up visits to maximize her success with intensive lifestyle modifications for her multiple  health conditions.   Objective:   Blood pressure 116/72, pulse 68, temperature 98.3 F (36.8 C), height '5\' 9"'$  (1.753 m), weight (!) 302 lb (137 kg), last menstrual period 04/15/2021, SpO2 100 %. Body mass index is 44.6 kg/m.  General: Cooperative, alert, well developed, in no acute distress. HEENT: Conjunctivae and lids unremarkable. Cardiovascular: Regular rhythm.  Lungs: Normal  work of breathing. Neurologic: No focal deficits.   Lab Results  Component Value Date   CREATININE 0.70 03/17/2021   BUN 19 03/17/2021   NA 140 03/17/2021   K 4.9 03/17/2021   CL 104 03/17/2021   CO2 23 03/17/2021   Lab Results  Component Value Date   ALT 20 03/17/2021   AST 13 03/17/2021   ALKPHOS 59 03/17/2021   BILITOT 0.3 03/17/2021   Lab Results  Component Value Date   HGBA1C 5.3 03/17/2021   HGBA1C 4.9 10/27/2020   HGBA1C 5.2 07/15/2020   HGBA1C 5.3 01/15/2020   HGBA1C 5.2 04/23/2019   Lab Results  Component Value Date   INSULIN 11.1 03/17/2021   INSULIN 5.3 07/15/2020   INSULIN 12.6 01/15/2020   INSULIN 14.6 04/23/2019   INSULIN 9.0 01/02/2019   Lab Results  Component Value Date   TSH 1.41 11/30/2018   Lab Results  Component Value Date   CHOL 208 (H) 03/17/2021   HDL 58 03/17/2021   LDLCALC 130 (H) 03/17/2021   TRIG 111 03/17/2021   CHOLHDL 3.6 03/17/2021   Lab Results  Component Value Date   VD25OH 53.7 10/27/2021   VD25OH 76.6 03/17/2021   VD25OH 49.8 07/15/2020   Lab Results  Component Value Date   WBC 6.7 05/22/2019   HGB 13.0 05/22/2019   HCT 39.8 05/22/2019   MCV 86.4 05/22/2019   PLT 268.0 05/22/2019   Attestation Statements:   Reviewed by clinician on day of visit: allergies, medications, problem list, medical history, surgical history, family history, social history, and previous encounter notes.  ILennette Bihari, CMA, am acting as transcriptionist for Dr. Raliegh Scarlet, DO.  I have reviewed the above documentation for accuracy and completeness, and I agree with the above. Marjory Sneddon, D.O.  The [place] was signed into law in 2016 which includes the topic of electronic health records.  This provides immediate access to information in MyChart.  This includes consultation notes, operative notes, office notes, lab results and pathology reports.  If you have any questions about what you read please let us know at your  next visit so we can discuss your concerns and take corrective action if need be.  We are right here with you.

## 2021-11-24 ENCOUNTER — Ambulatory Visit: Payer: 59 | Admitting: Professional

## 2021-11-24 ENCOUNTER — Encounter (INDEPENDENT_AMBULATORY_CARE_PROVIDER_SITE_OTHER): Payer: Self-pay | Admitting: Family Medicine

## 2021-11-24 ENCOUNTER — Ambulatory Visit (INDEPENDENT_AMBULATORY_CARE_PROVIDER_SITE_OTHER): Payer: 59 | Admitting: Family Medicine

## 2021-11-24 VITALS — BP 119/72 | HR 67 | Temp 98.0°F | Ht 69.0 in | Wt 315.0 lb

## 2021-11-24 DIAGNOSIS — E669 Obesity, unspecified: Secondary | ICD-10-CM | POA: Diagnosis not present

## 2021-11-24 DIAGNOSIS — Z3490 Encounter for supervision of normal pregnancy, unspecified, unspecified trimester: Secondary | ICD-10-CM

## 2021-11-24 DIAGNOSIS — Z331 Pregnant state, incidental: Secondary | ICD-10-CM | POA: Diagnosis not present

## 2021-11-24 DIAGNOSIS — Z6841 Body Mass Index (BMI) 40.0 and over, adult: Secondary | ICD-10-CM

## 2021-11-30 DIAGNOSIS — Z3490 Encounter for supervision of normal pregnancy, unspecified, unspecified trimester: Secondary | ICD-10-CM | POA: Insufficient documentation

## 2021-11-30 NOTE — Progress Notes (Signed)
Chief Complaint:   OBESITY [name] is here to discuss her progress with her obesity treatment plan along with follow-up of her obesity related diagnoses. [name] is on keeping a food journal and adhering to recommended goals of 2000-2400 calories and 130+ protein and states she is following her eating plan approximately 75-80% of the time. [name] states she is doing cardio exercises 30 minutes 1-2 times per week.  Today's visit was #: 77 Starting weight: 363 lbs Starting date: 11/02/2017 Today's weight: 315 lbs Today's date: 11/24/2021 Total lbs lost to date: 48 lbs Total lbs lost since last in-office visit: 0  Interim History: [name] is frustrated with weight gain.  She brought in her log of food and proteins, but she is below goal on calories. She gets 1500-1600 calories on most days and some days.   Subjective:   1. Pregnancy, 22.5 weeks [name] has increased swelling in her lower extremities.  She is at 23 weeks of pregnancy, she is almost due.  Due date end of September 2023. [name] is worried about her carbohydrate intake.   Assessment/Plan:  No orders of the defined types were placed in this encounter.   There are no discontinued medications.   No orders of the defined types were placed in this encounter.    1. Pregnancy, 22.5 weeks [name] has agreed to continue prudent nutritional plan, to increase her calories, her protein and increase water intake.  She has also agreed to decrease salt salt and simple carbohydrates intake.   2. Obesity with current BMI of 44.5 [name] has agreed to increase cardio to a goal of 5 days a week of 30 minutes, currently at 1-2 days per week. Her goal of water is 2 gallons daily.   [name] is currently in the action stage of change. As such, her goal is to continue with weight loss efforts. She has agreed to keeping a food journal and adhering to recommended goals of 2400-2600 calories and 180+ protein daily.   Exercise goals:  As  is.  Behavioral modification strategies: increasing lean protein intake, decreasing simple carbohydrates, and planning for success.  [name] has agreed to follow-up with our clinic in 4 weeks. She was informed of the importance of frequent follow-up visits to maximize her success with intensive lifestyle modifications for her multiple health conditions.   Objective:   Blood pressure 119/72, pulse 67, temperature 98 F (36.7 C), height '5\' 9"'$  (1.753 m), weight (!) 315 lb (142.9 kg), last menstrual period 04/15/2021, SpO2 100 %. Body mass index is 46.52 kg/m.  General: Cooperative, alert, well developed, in no acute distress. HEENT: Conjunctivae and lids unremarkable. Cardiovascular: Regular rhythm.  Lungs: Normal work of breathing. Neurologic: No focal deficits.   Lab Results  Component Value Date   CREATININE 0.70 03/17/2021   BUN 19 03/17/2021   NA 140 03/17/2021   K 4.9 03/17/2021   CL 104 03/17/2021   CO2 23 03/17/2021   Lab Results  Component Value Date   ALT 20 03/17/2021   AST 13 03/17/2021   ALKPHOS 59 03/17/2021   BILITOT 0.3 03/17/2021   Lab Results  Component Value Date   HGBA1C 5.3 03/17/2021   HGBA1C 4.9 10/27/2020   HGBA1C 5.2 07/15/2020   HGBA1C 5.3 01/15/2020   HGBA1C 5.2 04/23/2019   Lab Results  Component Value Date   INSULIN 11.1 03/17/2021   INSULIN 5.3 07/15/2020   INSULIN 12.6 01/15/2020   INSULIN 14.6 04/23/2019   INSULIN 9.0 01/02/2019  Lab Results  Component Value Date   TSH 1.41 11/30/2018   Lab Results  Component Value Date   CHOL 208 (H) 03/17/2021   HDL 58 03/17/2021   LDLCALC 130 (H) 03/17/2021   TRIG 111 03/17/2021   CHOLHDL 3.6 03/17/2021   Lab Results  Component Value Date   VD25OH 53.7 10/27/2021   VD25OH 76.6 03/17/2021   VD25OH 49.8 07/15/2020   Lab Results  Component Value Date   WBC 6.7 05/22/2019   HGB 13.0 05/22/2019   HCT 39.8 05/22/2019   MCV 86.4 05/22/2019   PLT 268.0 05/22/2019   Lab Results   Component Value Date   IRON 30 11/02/2017   TIBC 337 11/02/2017   FERRITIN 28 11/02/2017    Attestation Statements:   Reviewed by clinician on day of visit: allergies, medications, problem list, medical history, surgical history, family history, social history, and previous encounter notes.  I, Davy Pique, RMA, am acting as Location manager for [employer], DO.  I have reviewed the above documentation for accuracy and completeness, and I agree with the above. Marjory Sneddon, D.O.  The [place] was signed into law in 2016 which includes the topic of electronic health records.  This provides immediate access to information in MyChart.  This includes consultation notes, operative notes, office notes, lab results and pathology reports.  If you have any questions about what you read please let us know at your next visit so we can discuss your concerns and take corrective action if need be.  We are right here with you.

## 2021-12-03 DIAGNOSIS — O43192 Other malformation of placenta, second trimester: Secondary | ICD-10-CM | POA: Diagnosis not present

## 2021-12-03 DIAGNOSIS — O09812 Supervision of pregnancy resulting from assisted reproductive technology, second trimester: Secondary | ICD-10-CM | POA: Diagnosis not present

## 2021-12-03 DIAGNOSIS — O359XX1 Maternal care for (suspected) fetal abnormality and damage, unspecified, fetus 1: Secondary | ICD-10-CM | POA: Diagnosis not present

## 2021-12-03 DIAGNOSIS — Z3183 Encounter for assisted reproductive fertility procedure cycle: Secondary | ICD-10-CM | POA: Diagnosis not present

## 2021-12-03 DIAGNOSIS — Q8789 Other specified congenital malformation syndromes, not elsewhere classified: Secondary | ICD-10-CM | POA: Diagnosis not present

## 2021-12-03 DIAGNOSIS — O0992 Supervision of high risk pregnancy, unspecified, second trimester: Secondary | ICD-10-CM | POA: Diagnosis not present

## 2021-12-03 DIAGNOSIS — O09512 Supervision of elderly primigravida, second trimester: Secondary | ICD-10-CM | POA: Diagnosis not present

## 2021-12-03 DIAGNOSIS — Z362 Encounter for other antenatal screening follow-up: Secondary | ICD-10-CM | POA: Diagnosis not present

## 2021-12-03 DIAGNOSIS — O99212 Obesity complicating pregnancy, second trimester: Secondary | ICD-10-CM | POA: Diagnosis not present

## 2021-12-08 ENCOUNTER — Ambulatory Visit (INDEPENDENT_AMBULATORY_CARE_PROVIDER_SITE_OTHER): Payer: 59 | Admitting: Psychology

## 2021-12-08 DIAGNOSIS — F4322 Adjustment disorder with anxiety: Secondary | ICD-10-CM

## 2021-12-08 NOTE — Progress Notes (Unsigned)
? ? ? ? ? ? ? ? ? ? ? ? ? ? ?  [name], LCMHC ?

## 2021-12-08 NOTE — Progress Notes (Deleted)
? ? ? ? ? ? ? ? ? ? ? ? ? ? ?  [name], LCMHC ?

## 2021-12-08 NOTE — Progress Notes (Signed)
[place] Counselor/Therapist Progress Note  Patient ID: [name], MRN: [id number],    Date: 12/08/2021  Time Spent: 10:00am-10:58am    Treatment Type: Individual Therapy  pt is seen for a virtual video visit via caregility.  Pt joins from her home and counselor from her home office.    Reported Symptoms: irritability, compartmentalizing feelings.    Mental Status Exam: Appearance:  Well Groomed     Behavior: Appropriate  Motor: Normal  Speech/Language:  Normal Rate  Affect: Appropriate  Mood: irritable  Thought process: normal  Thought content:   WNL  Sensory/Perceptual disturbances:   WNL  Orientation: oriented to person, place, time/date, and situation  Attention: Good  Concentration: Good  Memory: WNL  Fund of knowledge:  Good  Insight:   Good  Judgment:  Good  Impulse Control: Good   Risk Assessment: Danger to Self:  No Self-injurious Behavior: No Danger to Others: No Duty to Warn:no Physical Aggression / Violence:No  Access to Firearms a concern: No  Gang Involvement:No   Subjective: 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  Pt reported she had worked w/ Lattie Haw on better expressing her emotions to improve communication in relationship.  Pt now looking to focus on concerns related to pregnancy and transition postpartum.  Counselor gathered hx, assessed pt current functioning per pt report and assisted in developing tx plan.  Explored how current expressing feelings and utilizing journaling ot assist w/ acknowledging emotions.  Encouraged continued intentional planning positive engagement and 'dating' w/ husband. Pt currently [redacted] weeks pregnant w/ son- due date 03/26/22.  Pregnancy by invitro fertilization w/ twin sister egg donor due to her premature ovarian failure.  Pt had been on estrogen replacement prior to pregnancy and how found helpful for  moodiness experienced.  Pt reported in first trimester did have nausea and a lot of fatigue.  Pt reports her irritability has greatly increased in 2nd trimester and sees impact on relationship.  Pt recognizing how journaling pregnancy has been helpful and good insight into how compartmentalizes feelings.  Pt commits to keeping journal for feelings as way to acknowledge and process.   Pt reports other stressors current w/ brother's mental health concerns and recent ultrasound indicating possible heart defect for son that needs further monitoring.  Pt discussed family hx- closeness to twin sister, how impact social development and how expresses feelings. Pt reports on minimal family support for postpartum, but positive friend support close by.  Pt identified goals for tx plan.    Interventions: Cognitive Behavioral Therapy and supportive  Diagnosis:Adjustment disorder with anxiety  Plan: Pt to f/u w/ counseling at least monthly.  Pt to f/u as scheduled w/ OBGYN and PCP.  Individualized Treatment Plan Strengths: insightful, dependable, planner, compassionate  Supports: husband, her mom and twin sister, long time friends.   Goal/Needs for Treatment:  In order of importance to patient 1) managing mood and decreasing irritability- not compartmentalizing 2) expressing feelings and communicating effectively 3) navigating being a new mom   Client Statement of Needs: not compartmentalizing emotions, being able to manage moods and irritability experience, being able to communicate effectively and express my feelings and ask for what I need.  Navigating being a new mom and anxieties that come with.   Treatment Level:outpatient counseling  Symptoms:irritability, difficulty communicating feelings, worry about being new mom and adjusting.  Client Treatment Preferences:counseling about monthly.  [name]'s goal for treatment:  Counselor, Jan Fireman, [facility] will support the patient's ability  to achieve the goals identified. Cognitive Behavioral Therapy, Assertive Communication/Conflict Resolution Training, Relaxation Training, ACT, Humanistic and other evidenced-based practices will be used to promote progress towards healthy functioning.   [name] will: Actively participate in therapy, working towards healthy functioning.    *Justification for Continuation/Discontinuation of Goal: R=Revised, O=Ongoing, A=Achieved, D=Discontinued  Goal 1) Increase coping skills to manage stress and decrease irritability reactions. Baseline date 12/08/21: Progress towards goal 0; How Often - Daily Target Date Goal Was reviewed Status Code Progress towards goal  12/09/22                Goal 2)Pt to increase identifying/expressing feelings and improving effective communication in her relationships. Baseline date 12/08/21: Progress towards goal 0; How Often - Daily  Target Date Goal Was reviewed Status Code Progress towards goal/Likert rating  12/09/22                Goal 3) Assist pt in expressing feelings, stressors, positives of becoming a mom and adjusting to this role in her life. Baseline date 12/08/21: Progress towards goal 0; How Often - Daily  Target Date Goal Was reviewed Status Code Progress towards goal/Likert rating  12/09/22                This plan has been reviewed and created by the following participants:  This plan will be reviewed at least every 12 months. Date Behavioral Health Clinician Date Guardian/Patient   12/08/21  [facility]. Lorin [facility]             12/08/21 Verbal Consent Provided         Jan Fireman, [facility]

## 2021-12-28 ENCOUNTER — Ambulatory Visit (INDEPENDENT_AMBULATORY_CARE_PROVIDER_SITE_OTHER): Payer: 59 | Admitting: Psychology

## 2021-12-28 DIAGNOSIS — F4322 Adjustment disorder with anxiety: Secondary | ICD-10-CM

## 2021-12-28 NOTE — Progress Notes (Signed)
[place] Counselor/Therapist Progress Note  Patient ID: [name], MRN: [id number],    Date: 12/28/2021  Time Spent: 10:00am-10:40am    Treatment Type: Individual Therapy  pt is seen for a virtual video visit via caregility.  Pt joins from her home and counselor from her home office.    Reported Symptoms: irritability improved  Mental Status Exam: Appearance:  Well Groomed     Behavior: Appropriate  Motor: Normal  Speech/Language:  Normal Rate  Affect: Appropriate  Mood: normal  Thought process: normal  Thought content:   WNL  Sensory/Perceptual disturbances:   WNL  Orientation: oriented to person, place, time/date, and situation  Attention: Good  Concentration: Good  Memory: WNL  Fund of knowledge:  Good  Insight:   Good  Judgment:  Good  Impulse Control: Good   Risk Assessment: Danger to Self:  No Self-injurious Behavior: No Danger to Others: No Duty to Warn:no Physical Aggression / Violence:No  Access to Firearms a concern: No  Gang Involvement:No   Subjective: Counselor assessed pt current functioning per pt report.  Processed w/pt improvement in mood.  Explored w/pt positives w/ coping and communication.  Discussed upcoming plans w/ prenatal classes and heading into 3rd trimester. Pt affect bright. Pt reported tired after just finishing 4 12 hour shifts.  Pt reported on her plans for relaxation today.  Pt reported that her mood has improved and no longer feeling irritable and angry.  Pt insight into hx of mood lability triggering.  Pt discussed positive support and communication.  Pt discussed some stressors w/ work and family and able to keep boundaries and reframe to focus on what is in her control.  Pt discussed starting prenatal classes in August and 2 baby showers and feeling good going into 3rd trimester.  Pt acknowledges some worries w/ birth process and potential inductions.  Pt discussed plan for becoming educated, support from her OBGYN  and making flexible plan.     Interventions: Cognitive Behavioral Therapy and supportive  Diagnosis:Adjustment disorder with anxiety  Plan: Pt to f/u w/ counseling at least monthly.  Pt to f/u as scheduled w/ OBGYN and PCP.  Individualized Treatment Plan Strengths: insightful, dependable, planner, compassionate  Supports: husband, her mom and twin sister, long time friends.   Goal/Needs for Treatment:  In order of importance to patient 1) managing mood and decreasing irritability- not compartmentalizing 2) expressing feelings and communicating effectively 3) navigating being a new mom   Client Statement of Needs: not compartmentalizing emotions, being able to manage moods and irritability experience, being able to communicate effectively and express my feelings and ask for what I need.  Navigating being a new mom and anxieties that come with.   Treatment Level:outpatient counseling  Symptoms:irritability, difficulty communicating feelings, worry about being new mom and adjusting.  Client Treatment Preferences:counseling about monthly.     [name]'s goal for treatment:  Counselor, Jan Fireman, [facility] will support the patient's ability to achieve the goals identified. Cognitive Behavioral Therapy, Assertive Communication/Conflict Resolution Training, Relaxation Training, ACT, Humanistic and other evidenced-based practices will be used to promote progress towards healthy functioning.   [name] will: Actively participate in therapy, working towards healthy functioning.    *Justification for Continuation/Discontinuation of Goal: R=Revised, O=Ongoing, A=Achieved, D=Discontinued  Goal 1) Increase coping skills to manage stress and decrease irritability reactions. Baseline date 12/08/21: Progress towards goal 0; How Often - Daily Target Date Goal Was reviewed Status Code Progress towards goal  12/09/22  Goal 2)Pt to increase identifying/expressing  feelings and improving effective communication in her relationships. Baseline date 12/08/21: Progress towards goal 0; How Often - Daily  Target Date Goal Was reviewed Status Code Progress towards goal/Likert rating  12/09/22                Goal 3) Assist pt in expressing feelings, stressors, positives of becoming a mom and adjusting to this role in her life. Baseline date 12/08/21: Progress towards goal 0; How Often - Daily  Target Date Goal Was reviewed Status Code Progress towards goal/Likert rating  12/09/22                This plan has been reviewed and created by the following participants:  This plan will be reviewed at least every 12 months. Date Behavioral Health Clinician Date Guardian/Patient   12/08/21  [facility]. [facility]             12/08/21 Verbal Consent Provided          Jan Fireman, [facility]

## 2021-12-30 ENCOUNTER — Ambulatory Visit (INDEPENDENT_AMBULATORY_CARE_PROVIDER_SITE_OTHER): Payer: 59 | Admitting: Family Medicine

## 2021-12-30 ENCOUNTER — Encounter (INDEPENDENT_AMBULATORY_CARE_PROVIDER_SITE_OTHER): Payer: Self-pay | Admitting: Family Medicine

## 2021-12-30 VITALS — BP 117/76 | HR 75 | Temp 98.2°F | Ht 69.0 in | Wt 324.0 lb

## 2021-12-30 DIAGNOSIS — E669 Obesity, unspecified: Secondary | ICD-10-CM | POA: Diagnosis not present

## 2021-12-30 DIAGNOSIS — O99213 Obesity complicating pregnancy, third trimester: Secondary | ICD-10-CM | POA: Diagnosis not present

## 2021-12-30 DIAGNOSIS — R7303 Prediabetes: Secondary | ICD-10-CM | POA: Diagnosis not present

## 2021-12-30 DIAGNOSIS — Z6841 Body Mass Index (BMI) 40.0 and over, adult: Secondary | ICD-10-CM | POA: Diagnosis not present

## 2021-12-30 DIAGNOSIS — O09813 Supervision of pregnancy resulting from assisted reproductive technology, third trimester: Secondary | ICD-10-CM | POA: Diagnosis not present

## 2021-12-30 DIAGNOSIS — Z3490 Encounter for supervision of normal pregnancy, unspecified, unspecified trimester: Secondary | ICD-10-CM

## 2021-12-30 DIAGNOSIS — Q100 Congenital ptosis: Secondary | ICD-10-CM | POA: Diagnosis not present

## 2021-12-30 DIAGNOSIS — O26843 Uterine size-date discrepancy, third trimester: Secondary | ICD-10-CM | POA: Diagnosis not present

## 2021-12-30 DIAGNOSIS — Q8789 Other specified congenital malformation syndromes, not elsewhere classified: Secondary | ICD-10-CM | POA: Diagnosis not present

## 2021-12-30 DIAGNOSIS — Z3689 Encounter for other specified antenatal screening: Secondary | ICD-10-CM | POA: Diagnosis not present

## 2021-12-30 DIAGNOSIS — O43193 Other malformation of placenta, third trimester: Secondary | ICD-10-CM | POA: Diagnosis not present

## 2021-12-30 DIAGNOSIS — O09513 Supervision of elderly primigravida, third trimester: Secondary | ICD-10-CM | POA: Diagnosis not present

## 2021-12-30 DIAGNOSIS — E559 Vitamin D deficiency, unspecified: Secondary | ICD-10-CM

## 2022-01-06 NOTE — Progress Notes (Signed)
Chief Complaint:   OBESITY [name] is here to discuss her progress with her obesity treatment plan along with follow-up of her obesity related diagnoses. [name] is on keeping a food journal and adhering to recommended goals of 2400-2600 calories and 180+ grams protein and states she is following her eating plan approximately 85-90% of the time. [name] states she is doing cardio 30 minutes 1-2 times per week.  Today's visit was #: 26 Starting weight: 363 lbs Starting date: 11/02/2017 Today's weight: 324 lbs Today's date: 12/30/2021 Total lbs lost to date: 39 Total lbs lost since last in-office visit: +9  Interim History: [name] is hitting 2100-2300 calories a day and also 130-170 grams of protein. She reports being hungry "24/7" and has some cravings due to being [redacted] weeks pregnant! Pt had labs at GYN today but is not sure what was drawn. She will bring in a copy of labs to next OV.  Subjective:   1. 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 They are happy with her and denied need for any changes.  2. Pre-diabetes Pt had been seeing reproductive specialist at [facility]. Her last A1c was 5.3 on 03/17/2021. I met t for the first time in May 2023, when pregnant and care has been per OB.  3. Vitamin D deficiency Approximately 2 months ago, pt's Vit D level was at goal at 45.7. she is taking OTC Vit D 2000 IU daily and has been consistent with it.  Assessment/Plan:  No orders of the defined types were placed in this encounter.   There are no discontinued medications.   No orders of the defined types were placed in this encounter.    1. Pregnancy Pt will bring in her labs from OB/GYN. Pt's weight at diagnosis of pregnancy was 287 lbs on 07/20/2021. She is now 324 lbs and OB is "fine with things." While pregnant, pt understands OB will call the hots.  2. Pre-diabetes [name] reports there is no evidence of  gestational diabetes thus far. She will continue testing and regular follow ups with OB. Continue prudent nutritional plan  3. Vitamin D deficiency Low Vitamin D level contributes to fatigue and are associated with obesity, breast, and colon cancer. She agrees to continue OTC Vitamin D 2,000 IU daily, as long as it is approved by OB and will follow-up for routine testing of Vitamin D, at least 2-3 times per year to avoid over-replacement.  4. Obesity BMI today is 75 [name] is currently in the action stage of change. As such, her goal is to continue with weight loss efforts. She has agreed to keeping a food journal and adhering to recommended goals of 2400-2600 calories and 180+ grams protein.   Goal: Do 30 minutes of cardio 3 days a week and increase water intake to 100 oz per day.  Exercise goals:  As is  Behavioral modification strategies: planning for success.  [name] has agreed to follow-up with our clinic in 6 weeks. She was informed of the importance of frequent follow-up visits to maximize her success with intensive lifestyle modifications for her multiple health conditions.   Objective:   Blood pressure 117/76, pulse 75, temperature 98.2 F (36.8 C), height _0  (1.753 m), weight (!) 324 lb (147 kg), last menstrual period 04/15/2021, SpO2 99 %. Body mass index is 47.85 kg/m.  General: Cooperative, alert, well developed, in no acute distress. HEENT: Conjunctivae  and lids unremarkable. Cardiovascular: Regular rhythm.  Lungs: Normal work of breathing. Neurologic: No focal deficits.   Lab Results  Component Value Date   CREATININE 0.70 03/17/2021   BUN 19 03/17/2021   NA 140 03/17/2021   K 4.9 03/17/2021   CL 104 03/17/2021   CO2 23 03/17/2021   Lab Results  Component Value Date   ALT 20 03/17/2021   AST 13 03/17/2021   ALKPHOS 59 03/17/2021   BILITOT 0.3 03/17/2021   Lab Results  Component Value Date   HGBA1C 5.3 03/17/2021   HGBA1C 4.9 10/27/2020   HGBA1C 5.2  07/15/2020   HGBA1C 5.3 01/15/2020   HGBA1C 5.2 04/23/2019   Lab Results  Component Value Date   INSULIN 11.1 03/17/2021   INSULIN 5.3 07/15/2020   INSULIN 12.6 01/15/2020   INSULIN 14.6 04/23/2019   INSULIN 9.0 01/02/2019   Lab Results  Component Value Date   TSH 1.41 11/30/2018   Lab Results  Component Value Date   CHOL 208 (H) 03/17/2021   HDL 58 03/17/2021   LDLCALC 130 (H) 03/17/2021   TRIG 111 03/17/2021   CHOLHDL 3.6 03/17/2021   Lab Results  Component Value Date   VD25OH 53.7 10/27/2021   VD25OH 76.6 03/17/2021   VD25OH 49.8 07/15/2020   Lab Results  Component Value Date   WBC 6.7 05/22/2019   HGB 13.0 05/22/2019   HCT 39.8 05/22/2019   MCV 86.4 05/22/2019   PLT 268.0 05/22/2019   Lab Results  Component Value Date   IRON 30 11/02/2017   TIBC 337 11/02/2017   FERRITIN 28 11/02/2017   Attestation Statements:   Reviewed by clinician on day of visit: allergies, medications, problem list, medical history, surgical history, family history, social history, and previous encounter notes.  I, Kathlene November, BS, CMA, am acting as transcriptionist for [employer], DO.   I have reviewed the above documentation for accuracy and completeness, and I agree with the above. Marjory Sneddon, D.O.  The [place] was signed into law in 2016 which includes the topic of electronic health records.  This provides immediate access to information in MyChart.  This includes consultation notes, operative notes, office notes, lab results and pathology reports.  If you have any questions about what you read please let us know at your next visit so we can discuss your concerns and take corrective action if need be.  We are right here with you.

## 2022-01-08 ENCOUNTER — Ambulatory Visit: Payer: 59 | Admitting: Professional

## 2022-01-08 DIAGNOSIS — O359XX0 Maternal care for (suspected) fetal abnormality and damage, unspecified, not applicable or unspecified: Secondary | ICD-10-CM | POA: Diagnosis not present

## 2022-01-08 DIAGNOSIS — Z3A29 29 weeks gestation of pregnancy: Secondary | ICD-10-CM | POA: Diagnosis not present

## 2022-01-14 DIAGNOSIS — O09813 Supervision of pregnancy resulting from assisted reproductive technology, third trimester: Secondary | ICD-10-CM | POA: Diagnosis not present

## 2022-01-14 DIAGNOSIS — O26843 Uterine size-date discrepancy, third trimester: Secondary | ICD-10-CM | POA: Diagnosis not present

## 2022-01-14 DIAGNOSIS — O09513 Supervision of elderly primigravida, third trimester: Secondary | ICD-10-CM | POA: Diagnosis not present

## 2022-01-14 DIAGNOSIS — O43193 Other malformation of placenta, third trimester: Secondary | ICD-10-CM | POA: Diagnosis not present

## 2022-01-14 DIAGNOSIS — Q8789 Other specified congenital malformation syndromes, not elsewhere classified: Secondary | ICD-10-CM | POA: Diagnosis not present

## 2022-01-14 DIAGNOSIS — O99213 Obesity complicating pregnancy, third trimester: Secondary | ICD-10-CM | POA: Diagnosis not present

## 2022-01-14 DIAGNOSIS — Z23 Encounter for immunization: Secondary | ICD-10-CM | POA: Diagnosis not present

## 2022-01-27 ENCOUNTER — Encounter (INDEPENDENT_AMBULATORY_CARE_PROVIDER_SITE_OTHER): Payer: Self-pay

## 2022-01-28 ENCOUNTER — Encounter (INDEPENDENT_AMBULATORY_CARE_PROVIDER_SITE_OTHER): Payer: Self-pay | Admitting: Family Medicine

## 2022-01-28 ENCOUNTER — Ambulatory Visit: Payer: 59 | Admitting: Psychology

## 2022-01-28 DIAGNOSIS — O09813 Supervision of pregnancy resulting from assisted reproductive technology, third trimester: Secondary | ICD-10-CM | POA: Diagnosis not present

## 2022-01-28 DIAGNOSIS — O26843 Uterine size-date discrepancy, third trimester: Secondary | ICD-10-CM | POA: Diagnosis not present

## 2022-01-28 DIAGNOSIS — O09513 Supervision of elderly primigravida, third trimester: Secondary | ICD-10-CM | POA: Diagnosis not present

## 2022-01-28 DIAGNOSIS — O43193 Other malformation of placenta, third trimester: Secondary | ICD-10-CM | POA: Diagnosis not present

## 2022-01-28 DIAGNOSIS — Q8789 Other specified congenital malformation syndromes, not elsewhere classified: Secondary | ICD-10-CM | POA: Diagnosis not present

## 2022-01-28 DIAGNOSIS — O99213 Obesity complicating pregnancy, third trimester: Secondary | ICD-10-CM | POA: Diagnosis not present

## 2022-02-06 ENCOUNTER — Inpatient Hospital Stay (HOSPITAL_BASED_OUTPATIENT_CLINIC_OR_DEPARTMENT_OTHER): Payer: 59

## 2022-02-06 ENCOUNTER — Inpatient Hospital Stay (HOSPITAL_COMMUNITY)
Admission: AD | Admit: 2022-02-06 | Discharge: 2022-02-06 | Disposition: A | Discharge status: Home / Self Care | Payer: 59 | Attending: Obstetrics and Gynecology | Admitting: Obstetrics and Gynecology

## 2022-02-06 ENCOUNTER — Encounter (HOSPITAL_COMMUNITY): Payer: Self-pay | Admitting: Obstetrics and Gynecology

## 2022-02-06 DIAGNOSIS — O99213 Obesity complicating pregnancy, third trimester: Secondary | ICD-10-CM

## 2022-02-06 DIAGNOSIS — Z3A33 33 weeks gestation of pregnancy: Secondary | ICD-10-CM

## 2022-02-06 DIAGNOSIS — O321XX0 Maternal care for breech presentation, not applicable or unspecified: Secondary | ICD-10-CM | POA: Insufficient documentation

## 2022-02-06 DIAGNOSIS — O368130 Decreased fetal movements, third trimester, not applicable or unspecified: Secondary | ICD-10-CM

## 2022-02-06 DIAGNOSIS — O99891 Other specified diseases and conditions complicating pregnancy: Secondary | ICD-10-CM | POA: Insufficient documentation

## 2022-02-06 DIAGNOSIS — O99283 Endocrine, nutritional and metabolic diseases complicating pregnancy, third trimester: Secondary | ICD-10-CM | POA: Diagnosis not present

## 2022-02-06 DIAGNOSIS — O368190 Decreased fetal movements, unspecified trimester, not applicable or unspecified: Secondary | ICD-10-CM

## 2022-02-06 NOTE — MAU Provider Note (Signed)
Event Date/Time   First Provider Initiated Contact with Patient 02/06/22 1556      S [name] is a 36 y.o. G1P0000 patient who presents to MAU today with complaint of decreased fetal movement. Patient reports decreased movement since this morning with no fetal movement between 1245p-3pm. Since arrival to MAU she reports feeling some fetal movement. She denies regular contractions, vaginal bleeding, or leaking fluid.    O BP (!) 124/54 (BP Location: Right Arm)   Pulse 87   Temp 97.9 F (36.6 C) (Oral)   Resp 16   Ht '5\' 9"'$  (1.753 m)   Wt (!) 152 kg   LMP 04/15/2021   SpO2 100%   BMI 49.47 kg/m  Physical Exam Vitals and nursing note reviewed.  Constitutional:      General: She is not in acute distress.    Appearance: She is obese.  Eyes:     Extraocular Movements: Extraocular movements intact.     Pupils: Pupils are equal, round, and reactive to light.  Cardiovascular:     Rate and Rhythm: Normal rate.  Pulmonary:     Effort: Pulmonary effort is normal.  Abdominal:     Comments: gravid  Musculoskeletal:        General: Normal range of motion.     Cervical back: Normal range of motion.  Skin:    General: Skin is warm and dry.  Neurological:     General: No focal deficit present.     Mental Status: She is alert and oriented to person, place, and time.  Psychiatric:        Mood and Affect: Mood normal.        Behavior: Behavior normal.        Judgment: Judgment normal.   NST FHR: 140 bpm, moderate variability, +15x15 accel, no decels Toco: quiet  A Medical screening exam complete Decreased fetal movement  P Medical screening complete BPP ordered per Dr. Alwyn Pea Dr. Alwyn Pea to assume care of patient   [employer] 02/06/2022 4:07 PM

## 2022-02-06 NOTE — MAU Provider Note (Signed)
MD MAU NOTE - coverage for Dr. Garwin Brothers    Chief Complaint  Patient presents with   Decreased Fetal Movement   HPI: [name] is a 36 y.o. G1P0000 at 33 weeks who presented to MAU today with complaint of decreased fetal movement x 1 day.  Patient reports decreased movement since this morning with no fetal movement between 1245p-3pm. Since arrival to MAU she reports feeling some fetal movement. She denies regular contractions, vaginal bleeding, or leaking fluid.   History of current pregnancy: G1P0 Prenatal Care with: Dr. Garwin Brothers at [facility] Patient entered prenatal care at 11 wks.   EDC 03/26/22 by IVF transfer consistent with 11 wk U/S.   Anatomy scan:  20 wks, complete   Significant prenatal problems: Morbid obesity affecting pregnancy IVF pregnancy Pre-diabetes -passed early and repeat 1 HR GCT Marginal cord insertion  Prenatal Labs: ABO, Rh: A+ Antibody: Neg Rubella: Imm RPR: NR  HBsAg: NR HIV: Neg 1 HR GCT: Pass Genetics: Low risk female neg MSAFP  Past Medical History:  Diagnosis Date   Anemia    Back pain    BPES syndrome    Enlarged thyroid    Infertility, female    Knee dislocation 2010   Left   Obesity    Palpitations    PCOS (polycystic ovarian syndrome)    PONV (postoperative nausea and vomiting)    Prediabetes    Premature ovarian failure    Vitamin B 12 deficiency    Vitamin D deficiency    Wears eyeglasses     Past Surgical History:  Procedure Laterality Date   ADENOIDECTOMY     EYE SURGERY Bilateral    NASAL SEPTOPLASTY W/ TURBINOPLASTY Bilateral 03/04/2017   Procedure: NASAL SEPTOPLASTY WITH BILATERAL INFERIOR TURBINATE REDUCTION;  Surgeon: Jerrell Belfast, MD;  Location: [facility] OR;  Service: ENT;  Laterality: Bilateral;   NASAL SEPTUM SURGERY  03/04/2017   TONSILLECTOMY     TONSILLECTOMY AND ADENOIDECTOMY Bilateral 03/04/2017   Procedure: TONSILLECTOMY;  Surgeon: Jerrell Belfast, MD;  Location: [facility] OR;  Service: ENT;   Laterality: Bilateral;    Family History  Problem Relation Age of Onset   Hyperlipidemia Father    Hypertension Father    Heart disease Father    Sudden death Father        Age 97   Other Father        BPES   Heart attack Father    Other Other        BPES   Other Other        BPES   Hyperlipidemia Mother    Hypertension Mother    Diabetes Mother    Anxiety disorder Mother    Obesity Mother    Other Brother        BPES   Other Sister        BPES   Other Paternal Uncle        BPES   Other Cousin        BPES   Other Cousin        BPES   Heart attack Maternal Grandmother    Heart disease Maternal Grandfather    Heart attack Maternal Grandfather    Lung cancer Maternal Grandfather    Heart disease Paternal Grandfather heartr attack   Heart attack Paternal Grandfather 52       MI   Sudden death Paternal Grandfather     Social History   Tobacco Use   Smoking status: Never  Smokeless tobacco: Never  Vaping Use   Vaping Use: Never used  Substance Use Topics   Alcohol use: Yes    Alcohol/week: 0.0 standard drinks of alcohol    Comment: socially   Drug use: No    Allergies: No Known Allergies  Medications Prior to Admission  Medication Sig Dispense Refill Last Dose   aspirin EC 81 MG tablet Take by mouth daily. Swallow whole.      docusate sodium (COLACE) 250 MG capsule Take 250 mg by mouth daily.      Cholecalciferol (VITAMIN D3) 50 MCG (2000 UT) capsule 2,000 IU qd      Multiple Vitamin (MULTIVITAMIN) capsule Take 1 capsule by mouth daily.       Review of Systems: Constitutional: Negative   HENT: Negative   Eyes: Negative   Respiratory: Negative   Cardiovascular: Negative   Gastrointestinal: Negative  Genitourinary: Neg for bloody show, Neg for LOF   Musculoskeletal: Negative   Skin: Negative   Neurological: Negative   Endo/Heme/Allergies: Negative   Psychiatric/Behavioral: Negative    Physical Exam: Blood pressure (!) 124/54, pulse 87,  temperature 97.9 F (36.6 C), temperature source Oral, resp. rate 16, height '5\' 9"'$  (1.753 m), weight (!) 152 kg, last menstrual period 04/15/2021, SpO2 100 %.  General: AAO x3, no signs of distress GU/GI: Abdomen gravid, non-tender, non-distended, active FM Extremities: No edema, negative for pain, tenderness, and cords  Cervical exam:Deferred  NST:  FHR: baseline rate 145 / variability moderate / accelerations present / absent decelerations TOCO: no ctx  BPP ultrasound done today 02/06/22: BPP 8/8 - wet read   Assessment: 36 year old G1P0 at 33 weeks 1 day with decreased fetal movement NST - Category 1 BPP 8/8   Plan:  D/C patient from MAU Patient has follow up appointment with Dr. Garwin Brothers this Wednesday.  Discussed fetal kick counts with patient and to observe closely fetal movements. Discussed use of stimulants like music, cold fluids, sugary drinks etc.  Patient cautioned to return to hospital if baby does not move well again.   Vanessa Kava, MD  02/06/2022 6:58 PM

## 2022-02-06 NOTE — MAU Note (Signed)
.  [name] is a 36 y.o. at 33w1dhere in MAU reporting: No fetal movement since 10 pm last night. Patient stated that she ate and had fluids. Denies vaginal bleeding and or leakage of fluid. +FM reported  Onset of complaint: 10pm Pain score: no pain Vitals:   02/06/22 1546  BP: (!) 124/54  Pulse: 87  Resp: 16  Temp: 97.9 F (36.6 C)  SpO2: 100%     FHT:145

## 2022-02-10 ENCOUNTER — Ambulatory Visit (INDEPENDENT_AMBULATORY_CARE_PROVIDER_SITE_OTHER): Payer: 59 | Admitting: Family Medicine

## 2022-02-10 DIAGNOSIS — O43193 Other malformation of placenta, third trimester: Secondary | ICD-10-CM | POA: Diagnosis not present

## 2022-02-10 DIAGNOSIS — O99213 Obesity complicating pregnancy, third trimester: Secondary | ICD-10-CM | POA: Diagnosis not present

## 2022-02-10 DIAGNOSIS — O09813 Supervision of pregnancy resulting from assisted reproductive technology, third trimester: Secondary | ICD-10-CM | POA: Diagnosis not present

## 2022-02-10 DIAGNOSIS — O09513 Supervision of elderly primigravida, third trimester: Secondary | ICD-10-CM | POA: Diagnosis not present

## 2022-02-10 DIAGNOSIS — O0993 Supervision of high risk pregnancy, unspecified, third trimester: Secondary | ICD-10-CM | POA: Diagnosis not present

## 2022-02-10 DIAGNOSIS — Q8789 Other specified congenital malformation syndromes, not elsewhere classified: Secondary | ICD-10-CM | POA: Diagnosis not present

## 2022-02-23 ENCOUNTER — Ambulatory Visit (INDEPENDENT_AMBULATORY_CARE_PROVIDER_SITE_OTHER): Payer: 59 | Admitting: Psychology

## 2022-02-23 DIAGNOSIS — F4322 Adjustment disorder with anxiety: Secondary | ICD-10-CM | POA: Diagnosis not present

## 2022-02-23 NOTE — Progress Notes (Signed)
[place] Counselor/Therapist Progress Note  Patient ID: AMBERLEE GARVEY, MRN: [id number],    Date: 02/23/2022  Time Spent: 10:00am-10:48am    Treatment Type: Individual Therapy  pt is seen for a virtual video visit via caregility.  Pt joins from her home and counselor from her home office.    Reported Symptoms: mood good, worries w/ balancing mom and wife roles Mental Status Exam: Appearance:  Well Groomed     Behavior: Appropriate  Motor: Normal  Speech/Language:  Normal Rate  Affect: Appropriate  Mood: normal  Thought process: normal  Thought content:   WNL  Sensory/Perceptual disturbances:   WNL  Orientation: oriented to person, place, time/date, and situation  Attention: Good  Concentration: Good  Memory: WNL  Fund of knowledge:  Good  Insight:   Good  Judgment:  Good  Impulse Control: Good   Risk Assessment: Danger to Self:  No Self-injurious Behavior: No Danger to Others: No Duty to Warn:no Physical Aggression / Violence:No  Access to Firearms a concern: No  Gang Involvement:No   Subjective: Counselor assessed pt current functioning per pt report.  Processed w/pt continued improvement in mood and 3rd trimester.  Explored w/pt concerns w/ balancing roles of mom and wife and how will have less time together as a couple w/ work schedules opposite to care for new baby.  Discussed awareness of each other's needs, effective communication and being intentional about time together.   Pt affect wnl.  Pt is currently [redacted] weeks pregnant w/ 10/6 due date. Pt discussed her transitions in 3rd trimester and coping through.  Pt discussed postpartum worry is balancing roles w/ being mom, work and attending to relationship when husband expresses currently feeling useless in relationship.  Pt was able to acknowledge positives of their communication and recognizing each other's needs and need to be intentional of creating time for emotional connecting.    Interventions:  Cognitive Behavioral Therapy and supportive  Diagnosis:Adjustment disorder with anxiety  Plan: Pt to f/u w/ counseling in a month- postpartum.  Pt to reach out if needed prior.  Pt to f/u as scheduled w/ OBGYN and PCP.  Individualized Treatment Plan Strengths: insightful, dependable, planner, compassionate  Supports: husband, her mom and twin sister, long time friends.   Goal/Needs for Treatment:  In order of importance to patient 1) managing mood and decreasing irritability- not compartmentalizing 2) expressing feelings and communicating effectively 3) navigating being a new mom   Client Statement of Needs: not compartmentalizing emotions, being able to manage moods and irritability experience, being able to communicate effectively and express my feelings and ask for what I need.  Navigating being a new mom and anxieties that come with.   Treatment Level:outpatient counseling  Symptoms:irritability, difficulty communicating feelings, worry about being new mom and adjusting.  Client Treatment Preferences:counseling about monthly.     [name]'s goal for treatment:  Counselor, Jan Fireman, [facility] will support the patient's ability to achieve the goals identified. Cognitive Behavioral Therapy, Assertive Communication/Conflict Resolution Training, Relaxation Training, ACT, Humanistic and other evidenced-based practices will be used to promote progress towards healthy functioning.   [name] will: Actively participate in therapy, working towards healthy functioning.    *Justification for Continuation/Discontinuation of Goal: R=Revised, O=Ongoing, A=Achieved, D=Discontinued  Goal 1) Increase coping skills to manage stress and decrease irritability reactions. Baseline date 12/08/21: Progress towards goal 0; How Often - Daily Target Date Goal Was reviewed Status Code Progress towards goal  12/09/22  Goal 2)Pt to increase identifying/expressing feelings  and improving effective communication in her relationships. Baseline date 12/08/21: Progress towards goal 0; How Often - Daily  Target Date Goal Was reviewed Status Code Progress towards goal/Likert rating  12/09/22                Goal 3) Assist pt in expressing feelings, stressors, positives of becoming a mom and adjusting to this role in her life. Baseline date 12/08/21: Progress towards goal 0; How Often - Daily  Target Date Goal Was reviewed Status Code Progress towards goal/Likert rating  12/09/22                This plan has been reviewed and created by the following participants:  This plan will be reviewed at least every 12 months. Date Behavioral Health Clinician Date Guardian/Patient   12/08/21  [facility]. [facility]             12/08/21 Verbal Consent Provided            Jan Fireman, [facility]

## 2022-02-24 DIAGNOSIS — Z3483 Encounter for supervision of other normal pregnancy, third trimester: Secondary | ICD-10-CM | POA: Diagnosis not present

## 2022-02-24 DIAGNOSIS — Z3482 Encounter for supervision of other normal pregnancy, second trimester: Secondary | ICD-10-CM | POA: Diagnosis not present

## 2022-02-25 DIAGNOSIS — O43193 Other malformation of placenta, third trimester: Secondary | ICD-10-CM | POA: Diagnosis not present

## 2022-02-25 DIAGNOSIS — Q8789 Other specified congenital malformation syndromes, not elsewhere classified: Secondary | ICD-10-CM | POA: Diagnosis not present

## 2022-02-25 DIAGNOSIS — O09513 Supervision of elderly primigravida, third trimester: Secondary | ICD-10-CM | POA: Diagnosis not present

## 2022-02-25 DIAGNOSIS — Z3685 Encounter for antenatal screening for Streptococcus B: Secondary | ICD-10-CM | POA: Diagnosis not present

## 2022-02-25 DIAGNOSIS — O99213 Obesity complicating pregnancy, third trimester: Secondary | ICD-10-CM | POA: Diagnosis not present

## 2022-02-25 DIAGNOSIS — O09813 Supervision of pregnancy resulting from assisted reproductive technology, third trimester: Secondary | ICD-10-CM | POA: Diagnosis not present

## 2022-02-25 LAB — OB RESULTS CONSOLE GBS: GBS: NEGATIVE

## 2022-03-02 DIAGNOSIS — O99213 Obesity complicating pregnancy, third trimester: Secondary | ICD-10-CM | POA: Diagnosis not present

## 2022-03-02 DIAGNOSIS — O09513 Supervision of elderly primigravida, third trimester: Secondary | ICD-10-CM | POA: Diagnosis not present

## 2022-03-02 DIAGNOSIS — O43193 Other malformation of placenta, third trimester: Secondary | ICD-10-CM | POA: Diagnosis not present

## 2022-03-02 DIAGNOSIS — O0993 Supervision of high risk pregnancy, unspecified, third trimester: Secondary | ICD-10-CM | POA: Diagnosis not present

## 2022-03-02 DIAGNOSIS — O09813 Supervision of pregnancy resulting from assisted reproductive technology, third trimester: Secondary | ICD-10-CM | POA: Diagnosis not present

## 2022-03-10 ENCOUNTER — Other Ambulatory Visit: Payer: Self-pay

## 2022-03-10 ENCOUNTER — Encounter (HOSPITAL_COMMUNITY): Payer: Self-pay | Admitting: Obstetrics and Gynecology

## 2022-03-10 ENCOUNTER — Inpatient Hospital Stay (HOSPITAL_COMMUNITY)
Admission: AD | Admit: 2022-03-10 | Discharge: 2022-03-10 | Disposition: A | Discharge status: Home / Self Care | Payer: 59 | Attending: Obstetrics and Gynecology | Admitting: Obstetrics and Gynecology

## 2022-03-10 DIAGNOSIS — O43193 Other malformation of placenta, third trimester: Secondary | ICD-10-CM | POA: Diagnosis not present

## 2022-03-10 DIAGNOSIS — Z3493 Encounter for supervision of normal pregnancy, unspecified, third trimester: Secondary | ICD-10-CM | POA: Diagnosis not present

## 2022-03-10 DIAGNOSIS — O99213 Obesity complicating pregnancy, third trimester: Secondary | ICD-10-CM | POA: Diagnosis not present

## 2022-03-10 DIAGNOSIS — O09813 Supervision of pregnancy resulting from assisted reproductive technology, third trimester: Secondary | ICD-10-CM | POA: Diagnosis not present

## 2022-03-10 DIAGNOSIS — O368330 Maternal care for abnormalities of the fetal heart rate or rhythm, third trimester, not applicable or unspecified: Secondary | ICD-10-CM | POA: Diagnosis not present

## 2022-03-10 DIAGNOSIS — O0993 Supervision of high risk pregnancy, unspecified, third trimester: Secondary | ICD-10-CM | POA: Diagnosis not present

## 2022-03-10 DIAGNOSIS — Z3A37 37 weeks gestation of pregnancy: Secondary | ICD-10-CM | POA: Insufficient documentation

## 2022-03-10 DIAGNOSIS — O09513 Supervision of elderly primigravida, third trimester: Secondary | ICD-10-CM | POA: Diagnosis not present

## 2022-03-10 DIAGNOSIS — Z3689 Encounter for other specified antenatal screening: Secondary | ICD-10-CM

## 2022-03-10 NOTE — MAU Note (Signed)
[name] is a 36 y.o. at 78w5dhere in MAU reporting: was in the office for weekly BPP and it was 6/8 off for movement. DFM since about 36 weeks but states baby is passing kick counts. No pain, bleeding, or LOF.  Onset of complaint: today  Pain score: 0/10  Vitals:   03/10/22 1139  BP: 130/73  Pulse: 81  Resp: 16  Temp: 98.4 F (36.9 C)  SpO2: 100%     FHT:142  Lab orders placed from triage: none

## 2022-03-10 NOTE — MAU Provider Note (Signed)
History     Chief Complaint  Patient presents with   Fetal Monitoring   36 yo G1P0 MWF @ 37 5/[redacted] weeks gestation sent from the office for NST due to [facility] 6/8 there. IVF pregnancy. Obesity affecting pregnancy. DFM since 36 wk  OB History     Gravida  1   Para  0   Term  0   Preterm  0   AB  0   Living  0      SAB  0   IAB  0   Ectopic  0   Multiple  0   Live Births  0           Past Medical History:  Diagnosis Date   Anemia    Back pain    BPES syndrome    Enlarged thyroid    Infertility, female    Knee dislocation 2010   Left   Obesity    Palpitations    PCOS (polycystic ovarian syndrome)    PONV (postoperative nausea and vomiting)    Prediabetes    Premature ovarian failure    Vitamin B 12 deficiency    Vitamin D deficiency    Wears eyeglasses     Past Surgical History:  Procedure Laterality Date   ADENOIDECTOMY     EYE SURGERY Bilateral    NASAL SEPTOPLASTY W/ TURBINOPLASTY Bilateral 03/04/2017   Procedure: NASAL SEPTOPLASTY WITH BILATERAL INFERIOR TURBINATE REDUCTION;  Surgeon: Jerrell Belfast, MD;  Location: [facility] OR;  Service: ENT;  Laterality: Bilateral;   NASAL SEPTUM SURGERY  03/04/2017   TONSILLECTOMY     TONSILLECTOMY AND ADENOIDECTOMY Bilateral 03/04/2017   Procedure: TONSILLECTOMY;  Surgeon: Jerrell Belfast, MD;  Location: [facility] OR;  Service: ENT;  Laterality: Bilateral;    Family History  Problem Relation Age of Onset   Hyperlipidemia Father    Hypertension Father    Heart disease Father    Sudden death Father        Age 3   Other Father        BPES   Heart attack Father    Other Other        BPES   Other Other        BPES   Hyperlipidemia Mother    Hypertension Mother    Diabetes Mother    Anxiety disorder Mother    Obesity Mother    Other Brother        BPES   Other Sister        BPES   Other Paternal Uncle        BPES   Other Cousin        BPES   Other Cousin        BPES   Heart attack Maternal Grandmother     Heart disease Maternal Grandfather    Heart attack Maternal Grandfather    Lung cancer Maternal Grandfather    Heart disease Paternal Grandfather heartr attack   Heart attack Paternal Grandfather 26       MI   Sudden death Paternal Grandfather     Social History   Tobacco Use   Smoking status: Never   Smokeless tobacco: Never  Vaping Use   Vaping Use: Never used  Substance Use Topics   Alcohol use: Never    Comment: socially   Drug use: No    Allergies: No Known Allergies  Medications Prior to Admission  Medication Sig Dispense Refill Last Dose   aspirin EC 81  MG tablet Take by mouth daily. Swallow whole.   03/09/2022   Cholecalciferol (VITAMIN D3) 50 MCG (2000 UT) capsule 2,000 IU qd   03/09/2022   docusate sodium (COLACE) 250 MG capsule Take 250 mg by mouth daily.   03/09/2022   Multiple Vitamin (MULTIVITAMIN) capsule Take 1 capsule by mouth daily.   03/09/2022     Physical Exam   Blood pressure 130/73, pulse 81, temperature 98.4 F (36.9 C), temperature source Oral, resp. rate 16, height '5\' 9"'$  (1.753 m), weight (!) 166.7 kg, last menstrual period 04/15/2021, SpO2 100 %.  No exam performed today,  done at office Tracing: baseline 140 (+) accel to 155  No ctx  IMP: Reactive NST( reassuring fetal testing) obesity affecting pregnancy . IVF pregnancy IUP @ 37 5/7 week P) d/c home. Cont antepartum fetal surveillance. Daily kick cts      ED Course   MDM   Marvene Staff, MD 12:34 PM 03/10/2022

## 2022-03-17 DIAGNOSIS — O99213 Obesity complicating pregnancy, third trimester: Secondary | ICD-10-CM | POA: Diagnosis not present

## 2022-03-17 DIAGNOSIS — O09513 Supervision of elderly primigravida, third trimester: Secondary | ICD-10-CM | POA: Diagnosis not present

## 2022-03-17 DIAGNOSIS — O09813 Supervision of pregnancy resulting from assisted reproductive technology, third trimester: Secondary | ICD-10-CM | POA: Diagnosis not present

## 2022-03-17 DIAGNOSIS — O26843 Uterine size-date discrepancy, third trimester: Secondary | ICD-10-CM | POA: Diagnosis not present

## 2022-03-17 DIAGNOSIS — O0993 Supervision of high risk pregnancy, unspecified, third trimester: Secondary | ICD-10-CM | POA: Diagnosis not present

## 2022-03-17 DIAGNOSIS — O43193 Other malformation of placenta, third trimester: Secondary | ICD-10-CM | POA: Diagnosis not present

## 2022-03-18 ENCOUNTER — Encounter (HOSPITAL_COMMUNITY): Payer: Self-pay | Admitting: *Deleted

## 2022-03-18 ENCOUNTER — Telehealth (HOSPITAL_COMMUNITY): Payer: Self-pay | Admitting: *Deleted

## 2022-03-18 NOTE — Telephone Encounter (Signed)
Preadmission screen  

## 2022-03-20 ENCOUNTER — Encounter (HOSPITAL_COMMUNITY): Payer: Self-pay | Admitting: Obstetrics and Gynecology

## 2022-03-20 ENCOUNTER — Inpatient Hospital Stay (HOSPITAL_COMMUNITY)
Admission: AD | Admit: 2022-03-20 | Discharge: 2022-03-24 | DRG: 807 | Disposition: A | Discharge status: Home / Self Care | Payer: 59 | Attending: Obstetrics and Gynecology | Admitting: Obstetrics and Gynecology

## 2022-03-20 ENCOUNTER — Inpatient Hospital Stay (HOSPITAL_COMMUNITY): Payer: 59

## 2022-03-20 DIAGNOSIS — O99213 Obesity complicating pregnancy, third trimester: Secondary | ICD-10-CM | POA: Diagnosis not present

## 2022-03-20 DIAGNOSIS — O99214 Obesity complicating childbirth: Secondary | ICD-10-CM | POA: Diagnosis not present

## 2022-03-20 DIAGNOSIS — H02529 Blepharophimosis unspecified eye, unspecified lid: Secondary | ICD-10-CM | POA: Diagnosis present

## 2022-03-20 DIAGNOSIS — Z3A39 39 weeks gestation of pregnancy: Secondary | ICD-10-CM

## 2022-03-20 DIAGNOSIS — O43193 Other malformation of placenta, third trimester: Secondary | ICD-10-CM | POA: Diagnosis present

## 2022-03-20 DIAGNOSIS — O09813 Supervision of pregnancy resulting from assisted reproductive technology, third trimester: Secondary | ICD-10-CM | POA: Diagnosis not present

## 2022-03-20 DIAGNOSIS — O43123 Velamentous insertion of umbilical cord, third trimester: Principal | ICD-10-CM | POA: Diagnosis present

## 2022-03-20 DIAGNOSIS — O3413 Maternal care for benign tumor of corpus uteri, third trimester: Secondary | ICD-10-CM | POA: Diagnosis not present

## 2022-03-20 DIAGNOSIS — O99892 Other specified diseases and conditions complicating childbirth: Secondary | ICD-10-CM | POA: Diagnosis present

## 2022-03-20 DIAGNOSIS — D259 Leiomyoma of uterus, unspecified: Secondary | ICD-10-CM | POA: Diagnosis not present

## 2022-03-20 DIAGNOSIS — O9902 Anemia complicating childbirth: Secondary | ICD-10-CM | POA: Diagnosis not present

## 2022-03-20 DIAGNOSIS — D649 Anemia, unspecified: Secondary | ICD-10-CM | POA: Diagnosis not present

## 2022-03-20 HISTORY — DX: Other malformation of placenta, third trimester: O43.193

## 2022-03-20 LAB — CBC
HCT: 34.8 % — ABNORMAL LOW (ref 36.0–46.0)
Hemoglobin: 11.6 g/dL — ABNORMAL LOW (ref 12.0–15.0)
MCH: 30.2 pg (ref 26.0–34.0)
MCHC: 33.3 g/dL (ref 30.0–36.0)
MCV: 90.6 fL (ref 80.0–100.0)
Platelets: 272 10*3/uL (ref 150–400)
RBC: 3.84 MIL/uL — ABNORMAL LOW (ref 3.87–5.11)
RDW: 13.9 % (ref 11.5–15.5)
WBC: 9.3 10*3/uL (ref 4.0–10.5)
nRBC: 0 % (ref 0.0–0.2)

## 2022-03-20 LAB — TYPE AND SCREEN
ABO/RH(D): A POS
Antibody Screen: NEGATIVE

## 2022-03-20 MED ORDER — LIDOCAINE HCL (PF) 1 % IJ SOLN: 30.0000 mL | INTRAMUSCULAR | Status: DC | PRN

## 2022-03-20 MED ORDER — OXYCODONE-ACETAMINOPHEN 5-325 MG PO TABS: 2.0000 | ORAL_TABLET | ORAL | Status: DC | PRN

## 2022-03-20 MED ORDER — TERBUTALINE SULFATE 1 MG/ML IJ SOLN: 0.2500 mg | Freq: Once | INTRAMUSCULAR | Status: DC | PRN

## 2022-03-20 MED ORDER — PHENYLEPHRINE 80 MCG/ML (10ML) SYRINGE FOR IV PUSH (FOR BLOOD PRESSURE SUPPORT): 80.0000 ug | PREFILLED_SYRINGE | INTRAVENOUS | Status: DC | PRN

## 2022-03-20 MED ORDER — EPHEDRINE 5 MG/ML INJ: 10.0000 mg | INTRAVENOUS | Status: DC | PRN

## 2022-03-20 MED ORDER — LACTATED RINGERS IV SOLN: INTRAVENOUS | Status: DC

## 2022-03-20 MED ORDER — LACTATED RINGERS IV SOLN: 500.0000 mL | INTRAVENOUS | Status: DC | PRN

## 2022-03-20 MED ORDER — OXYTOCIN BOLUS FROM INFUSION
333.0000 mL | Freq: Once | INTRAVENOUS | Status: AC
  Administered 2022-03-22: 333 mL via INTRAVENOUS

## 2022-03-20 MED ORDER — FENTANYL-BUPIVACAINE-NACL 0.5-0.125-0.9 MG/250ML-% EP SOLN
12.0000 mL/h | EPIDURAL | Status: DC | PRN
  Administered 2022-03-21 (×2): 12 mL/h via EPIDURAL
  Filled 2022-03-20 (×2): qty 250

## 2022-03-20 MED ORDER — OXYTOCIN-SODIUM CHLORIDE 30-0.9 UT/500ML-% IV SOLN
1.0000 m[IU]/min | INTRAVENOUS | Status: DC
  Administered 2022-03-20: 2 m[IU]/min via INTRAVENOUS
  Filled 2022-03-20: qty 500

## 2022-03-20 MED ORDER — FENTANYL CITRATE (PF) 100 MCG/2ML IJ SOLN
100.0000 ug | INTRAMUSCULAR | Status: DC | PRN
  Filled 2022-03-20: qty 2

## 2022-03-20 MED ORDER — ONDANSETRON HCL 4 MG/2ML IJ SOLN
4.0000 mg | Freq: Four times a day (QID) | INTRAMUSCULAR | Status: DC | PRN
  Administered 2022-03-20 – 2022-03-21 (×3): 4 mg via INTRAVENOUS
  Filled 2022-03-20 (×3): qty 2

## 2022-03-20 MED ORDER — ACETAMINOPHEN 325 MG PO TABS: 650.0000 mg | ORAL_TABLET | ORAL | Status: DC | PRN

## 2022-03-20 MED ORDER — OXYTOCIN-SODIUM CHLORIDE 30-0.9 UT/500ML-% IV SOLN: 2.5000 [IU]/h | INTRAVENOUS | Status: DC

## 2022-03-20 MED ORDER — LACTATED RINGERS IV SOLN: 500.0000 mL | Freq: Once | INTRAVENOUS | Status: DC

## 2022-03-20 MED ORDER — DIPHENHYDRAMINE HCL 50 MG/ML IJ SOLN
12.5000 mg | INTRAMUSCULAR | Status: DC | PRN
  Administered 2022-03-22: 12.5 mg via INTRAVENOUS
  Filled 2022-03-20 (×2): qty 1

## 2022-03-20 MED ORDER — OXYCODONE-ACETAMINOPHEN 5-325 MG PO TABS: 1.0000 | ORAL_TABLET | ORAL | Status: DC | PRN

## 2022-03-20 MED ORDER — OXYTOCIN 10 UNIT/ML IJ SOLN: 10.0000 [IU] | Freq: Once | INTRAMUSCULAR | Status: DC

## 2022-03-20 MED ORDER — SOD CITRATE-CITRIC ACID 500-334 MG/5ML PO SOLN: 30.0000 mL | ORAL | Status: DC | PRN

## 2022-03-20 NOTE — H&P (Signed)
[name] is a 36 y.o. female presenting for IOL @ 39 wk due to marginal cord insertion. IVF pregnancy. . OB History     Gravida  1   Para  0   Term  0   Preterm  0   AB  0   Living  0      SAB  0   IAB  0   Ectopic  0   Multiple  0   Live Births  0          Past Medical History:  Diagnosis Date   Anemia    Back pain    BPES syndrome    Enlarged thyroid    Infertility, female    Knee dislocation 2010   Left   Obesity    Palpitations    PCOS (polycystic ovarian syndrome)    PONV (postoperative nausea and vomiting)    Prediabetes    Premature ovarian failure    Vitamin B 12 deficiency    Vitamin D deficiency    Wears eyeglasses    Past Surgical History:  Procedure Laterality Date   ADENOIDECTOMY     EYE SURGERY Bilateral    NASAL SEPTOPLASTY W/ TURBINOPLASTY Bilateral 03/04/2017   Procedure: NASAL SEPTOPLASTY WITH BILATERAL INFERIOR TURBINATE REDUCTION;  Surgeon: Jerrell Belfast, MD;  Location: [place];  Service: ENT;  Laterality: Bilateral;   NASAL SEPTUM SURGERY  03/04/2017   TONSILLECTOMY     TONSILLECTOMY AND ADENOIDECTOMY Bilateral 03/04/2017   Procedure: TONSILLECTOMY;  Surgeon: Jerrell Belfast, MD;  Location: [facility] OR;  Service: ENT;  Laterality: Bilateral;   Family History: family history includes Anxiety disorder in her mother; Diabetes in her mother; Heart attack in her father, maternal grandfather, and maternal grandmother; Heart attack (age of onset: 46) in her paternal grandfather; Heart disease in her father and maternal grandfather; Heart disease (age of onset: heartr attack) in her paternal grandfather; Hyperlipidemia in her father and mother; Hypertension in her father and mother; Lung cancer in her maternal grandfather; Obesity in her mother; Other in her brother, cousin, cousin, father, paternal uncle, sister, and other family members; Sudden death in her father and paternal grandfather. Social History:  reports that she has never  smoked. She has never used smokeless tobacco. She reports that she does not drink alcohol and does not use drugs.     Maternal Diabetes: No Genetic Screening: Normal Maternal Ultrasounds/Referrals: Normal Fetal Ultrasounds or other Referrals:  Fetal echo Maternal Substance Abuse:  No Significant Maternal Medications:  Meds include: Other:  baby ASA Significant Maternal Lab Results:  Group B Strep negative Number of Prenatal Visits:greater than 3 verified prenatal visits Other Comments:   IVF donor egg( due to BPES)  Review of Systems  All other systems reviewed and are negative.  History Dilation: Closed Station: -3 Exam by:: [employer] RN Blood pressure 123/77, pulse 89, resp. rate 17, last menstrual period 04/15/2021. Maternal Exam:  Introitus: Normal vulva.   Physical Exam Constitutional:      Appearance: Normal appearance. She is obese.  HENT:     Head: Atraumatic.     Mouth/Throat:     Mouth: Mucous membranes are moist.  Eyes:     Pupils: Pupils are equal, round, and reactive to light.  Cardiovascular:     Rate and Rhythm: Regular rhythm.     Heart sounds: Normal heart sounds.  Pulmonary:     Breath sounds: Normal breath sounds.  Abdominal:     Palpations: Abdomen is  soft.     Comments: Pannus, soft gravid  Genitourinary:    General: Normal vulva.  Musculoskeletal:     Cervical back: Neck supple.  Skin:    General: Skin is warm and dry.  Neurological:     General: No focal deficit present.     Mental Status: She is alert and oriented to person, place, and time.  Psychiatric:        Mood and Affect: Mood normal.        Behavior: Behavior normal.     Prenatal labs: ABO, Rh: --/--/A POS (09/30 1720) Antibody: NEG (09/30 1720) Rubella: Immune (03/23 0000) RPR: Nonreactive (03/23 0000)  HBsAg: Negative (03/23 0000)  HIV: Non-reactive (03/23 0000)  GBS:   negative  Assessment/Plan: Marginal cord insertion IVF pregnancy Term gestation  Obesity  affecting pregnancy P) admit routine labs. Intracervical balloon placed. Pitocin induction. Analgesic prn   [name] A [name] 03/20/2022, 7:16 PM

## 2022-03-21 ENCOUNTER — Inpatient Hospital Stay (HOSPITAL_COMMUNITY): Payer: 59 | Admitting: Anesthesiology

## 2022-03-21 LAB — RPR: RPR Ser Ql: NONREACTIVE

## 2022-03-21 MED ORDER — DIPHENHYDRAMINE HCL 50 MG/ML IJ SOLN
25.0000 mg | Freq: Once | INTRAMUSCULAR | Status: DC
  Filled 2022-03-21: qty 1

## 2022-03-21 MED ORDER — FENTANYL CITRATE (PF) 100 MCG/2ML IJ SOLN
INTRAMUSCULAR | Status: DC | PRN
  Administered 2022-03-21: 100 ug via EPIDURAL

## 2022-03-21 MED ORDER — LIDOCAINE HCL (PF) 1 % IJ SOLN
INTRAMUSCULAR | Status: DC | PRN
  Administered 2022-03-21 – 2022-03-22 (×3): 4 mL via EPIDURAL
  Administered 2022-03-22: 6 mL via EPIDURAL

## 2022-03-21 MED ORDER — DIPHENHYDRAMINE HCL 50 MG/ML IJ SOLN
25.0000 mg | Freq: Once | INTRAMUSCULAR | Status: AC
  Administered 2022-03-21: 25 mg via INTRAVENOUS

## 2022-03-21 MED ORDER — MISOPROSTOL 50MCG HALF TABLET
50.0000 ug | ORAL_TABLET | ORAL | Status: DC | PRN
  Administered 2022-03-21 (×3): 50 ug via VAGINAL
  Filled 2022-03-21 (×3): qty 1

## 2022-03-21 MED ORDER — FENTANYL CITRATE (PF) 100 MCG/2ML IJ SOLN: INTRAMUSCULAR | Status: DC | PRN

## 2022-03-21 MED ORDER — BUPIVACAINE HCL (PF) 0.25 % IJ SOLN
INTRAMUSCULAR | Status: DC | PRN
  Administered 2022-03-21: 8 mL via EPIDURAL

## 2022-03-21 MED ORDER — MISOPROSTOL 50MCG HALF TABLET: 50.0000 ug | ORAL_TABLET | ORAL | Status: DC

## 2022-03-21 NOTE — Progress Notes (Signed)
S: discouraged  Due for cytotec Previous issue with pain on right has resolved Epidural  O: BP (!) 149/85   Pulse 84   Temp 98.7 F (37.1 C)   Resp 18   Ht '5\' 9"'$  (1.753 m)   Wt (!) 166.6 kg   LMP 04/15/2021   SpO2 97%   BMI 54.24 kg/m   VE 3/50/-3/-2 deviated to left Medium thickness  Tracing: reviewed. Difficult to monitor due to maternal body habitus And positional. However  (+) accels  when tracing well Baseline 150  small accel ? Ctx freq( pt notes some intermittent pressure but lasting <30 sec  IMP: marginal cord insertion Latent phase Obesity affecting pregnancy IVF pregnancy Term gestation P) cont with oral cytotec for ripening of cervix. Defer amniotomy due to thickness of cervix( trying to reduce prolonged ROM and higher risk of infection). Reviewed with pt and husband, 1) not in labor  thus no indication for C/S at this time. 2) increased risk for maternal incisional infection due to weight 3) irrespective of weight and risk of infection, need to have an indication for C/S due to inherent risk for any surgery

## 2022-03-21 NOTE — Anesthesia Preprocedure Evaluation (Signed)
Anesthesia Evaluation  Patient identified by MRN, date of birth, ID band Patient awake    Reviewed: Allergy & Precautions, Patient's Chart, lab work & pertinent test results  History of Anesthesia Complications (+) PONV and history of anesthetic complications  Airway Mallampati: II  TM Distance: >3 FB Neck ROM: Full    Dental no notable dental hx.    Pulmonary neg pulmonary ROS,    Pulmonary exam normal        Cardiovascular negative cardio ROS Normal cardiovascular exam     Neuro/Psych negative neurological ROS  negative psych ROS   GI/Hepatic negative GI ROS, Neg liver ROS,   Endo/Other  Morbid obesity (BMI 54)  Renal/GU negative Renal ROS  negative genitourinary   Musculoskeletal negative musculoskeletal ROS (+)   Abdominal   Peds  Hematology  (+) Blood dyscrasia (Hgb 11.6), anemia ,   Anesthesia Other Findings Day of surgery medications reviewed with patient.  Reproductive/Obstetrics (+) Pregnancy                             Anesthesia Physical Anesthesia Plan  ASA: 3  Anesthesia Plan: Epidural   Post-op Pain Management:    Induction:   PONV Risk Score and Plan: Treatment may vary due to age or medical condition  Airway Management Planned: Natural Airway  Additional Equipment: Fetal Monitoring  Intra-op Plan:   Post-operative Plan:   Informed Consent: I have reviewed the patients History and Physical, chart, labs and discussed the procedure including the risks, benefits and alternatives for the proposed anesthesia with the patient or authorized representative who has indicated his/her understanding and acceptance.       Plan Discussed with:   Anesthesia Plan Comments:         Anesthesia Quick Evaluation

## 2022-03-21 NOTE — Anesthesia Procedure Notes (Signed)
Epidural Patient location during procedure: OB Start time: 03/21/2022 2:49 AM End time: 03/21/2022 2:52 AM  Staffing Anesthesiologist: Brennan Bailey, MD Performed: anesthesiologist   Preanesthetic Checklist Completed: patient identified, IV checked, risks and benefits discussed, monitors and equipment checked, pre-op evaluation and timeout performed  Epidural Patient position: sitting Prep: DuraPrep and site prepped and draped Patient monitoring: continuous pulse ox, blood pressure and heart rate Approach: midline Location: L3-L4 Injection technique: LOR air  Needle:  Needle type: Tuohy  Needle gauge: 17 G Needle length: 9 cm Needle insertion depth: 8 cm Catheter type: closed end flexible Catheter size: 19 Gauge Catheter at skin depth: 13 cm Test dose: negative and Other (1% lidocaine)  Assessment Events: blood not aspirated, injection not painful, no injection resistance, no paresthesia and negative IV test  Additional Notes Patient identified. Risks, benefits, and alternatives discussed with patient including but not limited to bleeding, infection, nerve damage, paralysis, failed block, incomplete pain control, headache, blood pressure changes, nausea, vomiting, reactions to medication, itching, and postpartum back pain. Confirmed with bedside nurse the patient's most recent platelet count. Confirmed with patient that they are not currently taking any anticoagulation, have any bleeding history, or any family history of bleeding disorders. Patient expressed understanding and wished to proceed. All questions were answered. Sterile technique was used throughout the entire procedure. Please see nursing notes for vital signs.   Crisp LOR on first pass. Test dose was given through epidural catheter and negative prior to continuing to dose epidural or start infusion. Warning signs of high block given to the patient including shortness of breath, tingling/numbness in hands, complete  motor block, or any concerning symptoms with instructions to call for help. Patient was given instructions on fall risk and not to get out of bed. All questions and concerns addressed with instructions to call with any issues or inadequate analgesia.  Reason for block:procedure for pain

## 2022-03-21 NOTE — Progress Notes (Signed)
[name] is a 36 y.o. G1P0000 at 77w2dby ultrasound admitted for induction of labor due to marginal cord insertion.  Subjective: Some right  back pain Epidural  Objective: BP (!) 114/51   Pulse 74   Temp 98.7 F (37.1 C)   Resp 16   Ht '5\' 9"'$  (1.753 m)   Wt (!) 166.6 kg   LMP 04/15/2021   SpO2 97%   BMI 54.24 kg/m  No intake/output data recorded. No intake/output data recorded.  FHT:  FHR: 145 bpm, variability: moderate,  accelerations:  Present,  decelerations:  Absent UC:   none SVE:   3 cm dilated, edematous effaced, -2 station Attempted insertion of ice w/o success   Labs: Lab Results  Component Value Date   WBC 9.3 03/20/2022   HGB 11.6 (L) 03/20/2022   HCT 34.8 (L) 03/20/2022   MCV 90.6 03/20/2022   PLT 272 03/20/2022    Assessment / Plan: Latent phase  Marginal cord insertion IVF pregnancy Obesity affecting pregnancy P) cont with oral cytotec. Defer amniotomy given exam   Anticipated MOD:   guarded  Charnika Herbst A Latasha Buczkowski 03/21/2022, 2:12 PM

## 2022-03-21 NOTE — Progress Notes (Signed)
  [name] is a 36 y.o. G1P0000 at 99w2dby ultrasound admitted for induction of labor due to marginal cord insertion.  Subjective: Balloon out since 2 am Epidural  Objective: BP (!) 108/54   Pulse 71   Temp 98.2 F (36.8 C) (Oral)   Resp 17   Ht '5\' 9"'$  (1.753 m)   Wt (!) 166.6 kg   LMP 04/15/2021   SpO2 97%   BMI 54.24 kg/m  No intake/output data recorded. No intake/output data recorded. Pitocin 20 mIu FHT:  FHR: 130 bpm, variability: moderate,  accelerations:  Present,  decelerations:  Absent UC:   irreg SVE:   3cm dilated, 50 effaced, -2 station  thick/edematous   Labs: Lab Results  Component Value Date   WBC 9.3 03/20/2022   HGB 11.6 (L) 03/20/2022   HCT 34.8 (L) 03/20/2022   MCV 90.6 03/20/2022   PLT 272 03/20/2022    Assessment / Plan: Latent phase Marginal cord insertion IVF pregnancy Obesity affecting pregnancy P) stop pitocin. Resume oral cytotec for cervical ripening. Benadryl IV. Ice pack per vagina  Anticipated MOD:   guarded  Tabitha Riggins A Aldene Hendon 03/21/2022, 9:18 AM

## 2022-03-21 NOTE — Progress Notes (Signed)
S:  c/o lower vaginal and back pain  more comfortable after re-dosing of epidural S/p cytotec # 3  O: BP 134/64   Pulse 80   Temp 99.5 F (37.5 C) (Axillary)   Resp 16   Ht '5\' 9"'$  (1.753 m)   Wt (!) 166.6 kg   LMP 04/15/2021   SpO2 97%   BMI 54.24 kg/m   VE 4/70/-2 midposition AROM clear fluid ISE/IUPC placed  Tracing: baseline 150 min variability Subtle early decels  Ctx q 2 mins  IMP; Active phase  Marginal cord insertion IVF pregnancy Obesity affecting pregnancy suspect OP position.  P) flying cowgirl positions. Pitocin augmentation prn

## 2022-03-22 ENCOUNTER — Encounter (HOSPITAL_COMMUNITY): Payer: Self-pay | Admitting: Obstetrics and Gynecology

## 2022-03-22 MED ORDER — IBUPROFEN 600 MG PO TABS
600.0000 mg | ORAL_TABLET | Freq: Four times a day (QID) | ORAL | Status: DC
  Administered 2022-03-22 – 2022-03-24 (×10): 600 mg via ORAL
  Filled 2022-03-22 (×10): qty 1

## 2022-03-22 MED ORDER — DIBUCAINE (PERIANAL) 1 % EX OINT: 1.0000 | TOPICAL_OINTMENT | CUTANEOUS | Status: DC | PRN

## 2022-03-22 MED ORDER — LACTATED RINGERS AMNIOINFUSION: INTRAVENOUS | Status: DC

## 2022-03-22 MED ORDER — SENNOSIDES-DOCUSATE SODIUM 8.6-50 MG PO TABS
2.0000 | ORAL_TABLET | Freq: Every day | ORAL | Status: DC
  Administered 2022-03-24: 2 via ORAL
  Filled 2022-03-22 (×3): qty 2

## 2022-03-22 MED ORDER — ONDANSETRON HCL 4 MG PO TABS: 4.0000 mg | ORAL_TABLET | ORAL | Status: DC | PRN

## 2022-03-22 MED ORDER — FERROUS SULFATE 325 (65 FE) MG PO TABS
325.0000 mg | ORAL_TABLET | Freq: Two times a day (BID) | ORAL | Status: DC
  Administered 2022-03-22 – 2022-03-24 (×5): 325 mg via ORAL
  Filled 2022-03-22 (×5): qty 1

## 2022-03-22 MED ORDER — ZOLPIDEM TARTRATE 5 MG PO TABS: 5.0000 mg | ORAL_TABLET | Freq: Every evening | ORAL | Status: DC | PRN

## 2022-03-22 MED ORDER — SIMETHICONE 80 MG PO CHEW: 80.0000 mg | CHEWABLE_TABLET | ORAL | Status: DC | PRN

## 2022-03-22 MED ORDER — DIPHENHYDRAMINE HCL 25 MG PO CAPS: 25.0000 mg | ORAL_CAPSULE | Freq: Four times a day (QID) | ORAL | Status: DC | PRN

## 2022-03-22 MED ORDER — ONDANSETRON HCL 4 MG/2ML IJ SOLN: 4.0000 mg | INTRAMUSCULAR | Status: DC | PRN

## 2022-03-22 MED ORDER — ACETAMINOPHEN 325 MG PO TABS: 650.0000 mg | ORAL_TABLET | ORAL | Status: DC | PRN

## 2022-03-22 MED ORDER — PRENATAL MULTIVITAMIN CH
1.0000 | ORAL_TABLET | Freq: Every day | ORAL | Status: DC
  Administered 2022-03-22 – 2022-03-24 (×3): 1 via ORAL
  Filled 2022-03-22 (×3): qty 1

## 2022-03-22 MED ORDER — WITCH HAZEL-GLYCERIN EX PADS: 1.0000 | MEDICATED_PAD | CUTANEOUS | Status: DC | PRN

## 2022-03-22 MED ORDER — COCONUT OIL OIL
1.0000 | TOPICAL_OIL | Status: DC | PRN
  Administered 2022-03-23 – 2022-03-24 (×2): 1 via TOPICAL

## 2022-03-22 MED ORDER — BENZOCAINE-MENTHOL 20-0.5 % EX AERO
1.0000 | INHALATION_SPRAY | CUTANEOUS | Status: DC | PRN
  Administered 2022-03-22: 1 via TOPICAL
  Filled 2022-03-22: qty 56

## 2022-03-22 NOTE — Anesthesia Procedure Notes (Signed)
Epidural Patient location during procedure: OB  Staffing Anesthesiologist: Lidia Collum, MD Performed: anesthesiologist   Preanesthetic Checklist Completed: patient identified, IV checked, risks and benefits discussed, monitors and equipment checked, pre-op evaluation and timeout performed  Epidural Patient position: sitting Prep: DuraPrep Patient monitoring: heart rate, continuous pulse ox and blood pressure Approach: midline Location: L4-L5 Injection technique: LOR air  Needle:  Needle type: Tuohy  Needle gauge: 17 G Needle length: 9 cm Needle insertion depth: 8.5 cm Catheter type: closed end flexible Catheter size: 19 Gauge Catheter at skin depth: 14 cm Test dose: negative  Assessment Events: blood not aspirated, injection not painful, no injection resistance, no paresthesia and negative IV test  Additional Notes Catheter replacement due to significant increase in pain that did not resolve with manual or pump boluses. Easy, single pass replacement one level below original catheter. Reason for block:procedure for pain

## 2022-03-22 NOTE — Anesthesia Postprocedure Evaluation (Signed)
Anesthesia Post Note  Patient: [name]  Procedure(s) Performed: AN AD HOC LABOR EPIDURAL     Patient location during evaluation: Mother Baby Anesthesia Type: Epidural Level of consciousness: awake and alert and oriented Pain management: satisfactory to patient Vital Signs Assessment: post-procedure vital signs reviewed and stable Respiratory status: respiratory function stable Cardiovascular status: stable Postop Assessment: no headache, no backache, epidural receding, patient able to bend at knees, no signs of nausea or vomiting, adequate PO intake and able to ambulate Anesthetic complications: no   No notable events documented.  Last Vitals:  Vitals:   03/22/22 0750 03/22/22 1150  BP: (!) 98/55 111/69  Pulse: 66 98  Resp: 18 18  Temp: 36.8 C 36.7 C  SpO2: 96% 99%    Last Pain:  Vitals:   03/22/22 1330  TempSrc:   PainSc: 0-No pain   Pain Goal:                   Gae Bihl

## 2022-03-22 NOTE — Progress Notes (Signed)
S: Feeling much better Epidural just replaced  O: BP 126/62   Pulse 60   Temp 98.1 F (36.7 C) (Oral)   Resp 17   Ht '5\' 9"'$  (1.753 m)   Wt (!) 166.6 kg   LMP 04/15/2021   SpO2 98%   BMI 54.24 kg/m  \Pitocin  VE deferred due to dysfunctional uterine fibroid   Tracing: baseline 145 (+) accels  Occ variables and early decel Ctx q 2-5 mins  IMP: Protracted active phase due to dysfunctional uterine pattern Marginal cord insertion IVF pregnancy Obesity affecting pregnancy Term P) cont pitocin. IV fluid bolus. Exaggerated sims positions

## 2022-03-22 NOTE — Lactation Note (Addendum)
This note was copied from a baby's chart. Lactation Consultation Note  Patient Name: [name] [id number] Date: 03/22/2022 Reason for consult: Primapara;1st time breastfeeding;Mother's request;Initial assessment;Term;Breastfeeding assistance Age:34 hours  P1, Term, Infant Female  LC entered the room and the supporting parent was changing the infant.  Per the birth parent, the infant "[name]" has latched 2-3 times, but she is unsure if he is getting enough.  The birth parent states that the infant stayed on for 20 min during the last feeding.  LC encouraged the birth parent and spoke with her about infant behavior.  LC also let the birth parent know that it was normal not to see drops when hand expressing on day 1.  The infant got stool on his bassinet sheet during the diaper change. LC changed the sheet and asked the birth parent to call for breastfeeding assistance when the infant is ready to feed.   Current Feeding Plan:  Breastfeed 8+ times in 24 hours according to feeding cues.  Hand express and feed EBM to the infant via a spoon.  Call [place] for assistance with breastfeeding.   Maternal Data Has patient been taught Hand Expression?: Yes Does the patient have breastfeeding experience prior to this delivery?: No  Feeding Mother's Current Feeding Choice: Breast Milk  LATCH Score                    Lactation Tools Discussed/Used    Interventions Interventions: Breast feeding basics reviewed;Education;LC Services brochure  Discharge Pump: Personal;DEBP  Consult Status Consult Status: Follow-up Date: 03/23/22 Follow-up type: In-patient    [name] 03/22/2022, 5:09 PM

## 2022-03-22 NOTE — Lactation Note (Signed)
This note was copied from a baby's chart. Lactation Consultation Note  Patient Name: [name] [id number]'U Date: 03/22/2022 Reason for consult: Follow-up assessment;Primapara;1st time breastfeeding;Term;Breastfeeding assistance Age:34 hours  P1, Term, Infant Female  LC called to the room per the birth parent's request.  Infant [name] was crying at the breast.  Per the birth parent, he is either sleeping or fussy.  LC assisted the birth parent with trying to [place] to the left breast.  The infant cried and would not latch.  The birth parent placed the infant STS, and he calmed down.  The birth parent stated that she did not know if he was getting anything.  LC reviewed infant behavior, milk production, and infant behavior.  The birth parent will call RN/LC for breastfeeding assistance.   Maternal Data Has patient been taught Hand Expression?: Yes Does the patient have breastfeeding experience prior to this delivery?: No  Feeding Mother's Current Feeding Choice: Breast Milk  LATCH Score                    Lactation Tools Discussed/Used    Interventions Interventions: Assisted with latch;Breast feeding basics reviewed;Education  Discharge Pump: Personal;DEBP  Consult Status Consult Status: Follow-up Date: 03/23/22 Follow-up type: In-patient    Lysbeth Penner 03/22/2022, 6:34 PM

## 2022-03-22 NOTE — Progress Notes (Signed)
Called by RN for up date Per RN VE 9.5/ edema part of ant lip/0 station With deep variable decelerations to 70's  Tracing reviewed ; baseline 160 (+) accel (+) deep variable decel Down to 130 lasting 45-50 sec with each contraction Ctx q  2-3 mins  IMP: variable decelerations due to cord compressions P) start amnioinfusion. Cont with pitocin. IV benadryl for cervical edema

## 2022-03-22 NOTE — Lactation Note (Signed)
This note was copied from a baby's chart. Lactation Consultation Note  Patient Name: [name] [id number] Date: 03/22/2022   Age:34 hours   LC Note:   Attempted to visit with family, however, it was not a convenient time.  Will follow up later this evening.   Maternal Data    Feeding    LATCH Score                    Lactation Tools Discussed/Used    Interventions    Discharge    Consult Status      Joice Nazario R Niccolas Loeper 03/22/2022, 3:13 PM

## 2022-03-23 LAB — CBC
HCT: 29.1 % — ABNORMAL LOW (ref 36.0–46.0)
Hemoglobin: 9.3 g/dL — ABNORMAL LOW (ref 12.0–15.0)
MCH: 29.4 pg (ref 26.0–34.0)
MCHC: 32 g/dL (ref 30.0–36.0)
MCV: 92.1 fL (ref 80.0–100.0)
Platelets: 201 10*3/uL (ref 150–400)
RBC: 3.16 MIL/uL — ABNORMAL LOW (ref 3.87–5.11)
RDW: 14.3 % (ref 11.5–15.5)
WBC: 11.7 10*3/uL — ABNORMAL HIGH (ref 4.0–10.5)
nRBC: 0 % (ref 0.0–0.2)

## 2022-03-23 NOTE — Lactation Note (Signed)
This note was copied from a baby's chart. Lactation Consultation Note  Patient Name: [name] [id number] Date: 03/23/2022 Reason for consult: Follow-up assessment;Difficult latch;Primapara;1st time breastfeeding;Term;Infant weight loss;Maternal endocrine disorder Age:34 hours As LC entered mom attempted to latch, LC noted baby appeared to be latched, and when observed closely dimpling in the cheek, and baby latched - using the upper lip and not latched with lower jaw due to areola edema .  LC discussed with mom. Per mom mentioned in the middle of the night baby latched well on both breast.  LC offered to assist and attempted to latch after pre pump, reverse pressure , and baby was still having difficulty sustaining a deep latch after 5-6 sucks.  Per mom mentioned baby had only had 1 small wets and 3 stools.  Due to the baby being 76 hours old and mom telling [place] baby had been falling asleep with feedings, recessed chin, not sustaining a latch LC recommended a NS and Donor milk.  Mom signed the consent for donor milk , LC showed mom how to apply the NS ( #24 NS used and instilled donor milk in the top. Baby just mouthed the NS and could not get a latch with depth.  LC recommended due  areola edema ( needing work with pumping ), DL, recessed chin,  only one small wet, and being sleepy to feed the needed to be hydrated and calories from a bottle pace feeding.  MBURN pace fed the baby with purple very slow flow nipple and baby tolerated well  ( 20 ml ), DEBP set up and had mom pump 1st with #24 F , per mom pinching, and switched to the #27 F with coconut oil. Tissue was snug with #24 F , so #21 F was not tried. No EBM yield.  Since the baby has only had a one good feeding during the night [place] recommended 3 feedings of donor milk starting at 20 ml and increasing if baby is not satisfied and mom just pump with DEBP in hopes to change the nipple / areola complex for a good . LC explained this to  mom and she had a good understanding and expressed she felt so better after fed was fed.   Maternal Data Has patient been taught Hand Expression?: Yes Does the patient have breastfeeding experience prior to this delivery?: No  Feeding Mother's Current Feeding Choice: Breast Milk and Donor Milk  LATCH Score Latch: Repeated attempts needed to sustain latch, nipple held in mouth throughout feeding, stimulation needed to elicit sucking reflex.  Audible Swallowing: None  Type of Nipple: Flat (due to areola edema)  Comfort (Breast/Nipple): Soft / non-tender  Hold (Positioning): Assistance needed to correctly position infant at breast and maintain latch.  LATCH Score: 5   Lactation Tools Discussed/Used Tools: Nipple Shields;Pump;Hands-free pumping top Nipple shield size: 24  Interventions Interventions: Breast feeding basics reviewed;Assisted with latch;Skin to skin;Breast massage;Hand express;Pre-pump if needed;Reverse pressure;Breast compression;Adjust position;Support pillows;Position options;Coconut oil;Hand pump;DEBP;Education;Pace feeding  Discharge Pump: DEBP;Personal;Manual WIC Program: No  Consult Status Consult Status: Follow-up Date: 03/23/22 Follow-up type: In-patient    [place] 03/23/2022, 4:19 PM

## 2022-03-23 NOTE — Progress Notes (Signed)
PPD 1  SVD:   S:  Pt reports feeling well/ Tolerating po/ Voiding without problems/ No n/v/ Bleeding is light/ Pain controlled withprescription NSAID's including ibuprofen (Motrin)  Newborn info live female   O:  A & O x 3 / VS: Blood pressure 121/78, pulse 77, temperature 98.3 F (36.8 C), temperature source Oral, resp. rate 18, height '5\' 9"'$  (1.753 m), weight (!) 166.6 kg, last menstrual period 04/15/2021, SpO2 100 %, unknown if currently breastfeeding.  LABS:  Results for orders placed or performed during the hospital encounter of 03/20/22 (from the past 24 hour(s))  CBC     Status: Abnormal   Collection Time: 03/23/22  4:41 AM  Result Value Ref Range   WBC 11.7 (H) 4.0 - 10.5 K/uL   RBC 3.16 (L) 3.87 - 5.11 MIL/uL   Hemoglobin 9.3 (L) 12.0 - 15.0 g/dL   HCT 29.1 (L) 36.0 - 46.0 %   MCV 92.1 80.0 - 100.0 fL   MCH 29.4 26.0 - 34.0 pg   MCHC 32.0 30.0 - 36.0 g/dL   RDW 14.3 11.5 - 15.5 %   Platelets 201 150 - 400 K/uL   nRBC 0.0 0.0 - 0.2 %    I&O: I/O last 3 completed shifts: In: -  Out: 2154 [Urine:1950; Blood:204]   No intake/output data recorded.  Lungs: chest clear, no wheezing, rales, normal symmetric air entry  Heart: regular rate and rhythm, S1, S2 normal, no murmur, click, rub or gallop  Abdomen: uterus firm 1 FB above umb  Perineum: not inspected  Lochia: light  Extremities:no redness or tenderness in the calves or thighs, edema 1+    A/P: PPD # 1/ G1P1001  Doing well  Continue routine post partum orders  Anticipate d/c in am

## 2022-03-24 ENCOUNTER — Other Ambulatory Visit (HOSPITAL_COMMUNITY): Payer: Self-pay

## 2022-03-24 MED ORDER — IBUPROFEN 600 MG PO TABS
600.0000 mg | ORAL_TABLET | Freq: Four times a day (QID) | ORAL | 11 refills | Status: DC | PRN
  Filled 2022-03-24: qty 30, 8d supply, fill #0

## 2022-03-24 NOTE — Lactation Note (Addendum)
This note was copied from a baby's chart. Lactation Consultation Note  Patient Name: [name] [id number] Date: 03/24/2022 Reason for consult: Follow-up assessment;Primapara;Term Age:34 hours  Slight breast fullness noted in upper outer quadrants. Mom was observed pumping. Mom needs a size 27 flange on her L breast & Mom felt more comfortable with a size 27 on her R breast.   Mom's questions about latching were answered; Mom may call to get assistance in trying to latch while on her side.  Lactation Tools Discussed/Used Tools: Pump Flange Size: 27 Breast pump type: Double-Electric Breast Pump Pumping frequency: q3h Pumped volume:  (drops)  Interventions Interventions: DEBP;Education  Discharge Pump: Personal (Spectra)  Consult Status Consult Status: PRN   Larkin Ina 03/24/2022, 12:13 PM

## 2022-03-24 NOTE — Discharge Summary (Signed)
Postpartum Discharge Summary  Date of Service updated     Patient Name: [name] DOB: 1986-01-12 MRN: [id number]  Date of admission: 03/20/2022 Delivery date:03/22/2022  Delivering provider: Rajanae Mantia  Date of discharge: 03/24/2022  Admitting diagnosis: Marginal insertion of umbilical cord affecting management of mother in third trimester [O43.193] Postpartum care following vaginal delivery [Z39.2] Intrauterine pregnancy: 39wk     Secondary diagnosis:  Principal Problem:   Marginal insertion of umbilical cord affecting management of mother in third trimester Active Problems:   Postpartum care following vaginal delivery  Additional problems: obesity affecting pregnancy. IVF pregnancy, BPES syndrome    Discharge diagnosis: Term Pregnancy Delivered and IVF pregnancy, obesity affecting pregnancy     , BPES syndrome                                          Post partum procedures: none Augmentation: AROM and Cytotec Complications: None  Hospital course: Induction of Labor With Vaginal Delivery   36 y.o. yo G1P1001 at 24w3dwas admitted to the hospital 03/20/2022 for induction of labor.  Indication for induction:  marginal cord insertion .  Patient had an labor course complicated by prolonged latent phase. Initially IP foley and pitocin. Cervical edema. IV benadryl, oral cytotec. Arom. Down regulation and calcium intake. Circuit of exercises Membrane Rupture Time/Date: 8:20 PM ,03/21/2022   Delivery Method:Vaginal, Spontaneous  Episiotomy: None  Lacerations:  Vaginal  Details of delivery can be found in separate delivery note.  Patient had a postpartum course complicated by none. Patient is discharged home .  Newborn Data: Birth date:03/22/2022  Birth time:4:04 AM  Gender:Female  Living status:Living  Apgars:8 ,8  Weight:3730 g   Magnesium Sulfate received: No BMZ received: No Rhophylac:No MMR:No T-DaP:Given prenatally Flu: No Transfusion:No  Physical exam   Vitals:   03/22/22 1537 03/22/22 2027 03/23/22 0545 03/23/22 2121  BP: (!) 111/53 109/65 121/78 111/64  Pulse: 94 87 77 90  Resp: _0 Temp: 98.6 F (37 C) 98.7 F (37.1 C) 98.3 F (36.8 C) 98 F (36.7 C)  TempSrc: Oral Oral Oral Oral  SpO2: 98% 100% 100%   Weight:      Height:       General: alert, cooperative, and no distress Lochia: appropriate Uterine Fundus: firm Incision: N/A DVT Evaluation: No evidence of DVT seen on physical exam. Calf/Ankle edema is present Labs: Lab Results  Component Value Date   WBC 11.7 (H) 03/23/2022   HGB 9.3 (L) 03/23/2022   HCT 29.1 (L) 03/23/2022   MCV 92.1 03/23/2022   PLT 201 03/23/2022      Latest Ref Rng & Units 03/17/2021    8:13 AM  CMP  Glucose 70 - 99 mg/dL 81   BUN 6 - 20 mg/dL 19   Creatinine 0.57 - 1.00 mg/dL 0.70   Sodium 134 - 144 mmol/L 140   Potassium 3.5 - 5.2 mmol/L 4.9   Chloride 96 - 106 mmol/L 104   CO2 20 - 29 mmol/L 23   Calcium 8.7 - 10.2 mg/dL 9.5   Total Protein 6.0 - 8.5 g/dL 7.1   Total Bilirubin 0.0 - 1.2 mg/dL 0.3   Alkaline Phos 44 - 121 IU/L 59   AST 0 - 40 IU/L 13   ALT 0 - 32 IU/L 20    Edinburgh Score:    03/22/2022  11:50 AM  Edinburgh Postnatal Depression Scale Screening Tool  I have been able to laugh and see the funny side of things. 0  I have looked forward with enjoyment to things. 0  I have blamed myself unnecessarily when things went wrong. 1  I have been anxious or worried for no good reason. 1  I have felt scared or panicky for no good reason. 0  Things have been getting on top of me. 0  I have been so unhappy that I have had difficulty sleeping. 0  I have felt sad or miserable. 0  I have been so unhappy that I have been crying. 0  The thought of harming myself has occurred to me. 0  Edinburgh Postnatal Depression Scale Total 2      After visit meds:  Allergies as of 03/24/2022   No Known Allergies      Medication List     TAKE these medications     COLACE PO Take 1 capsule by mouth daily as needed (constipation).   ibuprofen 600 MG tablet Commonly known as: ADVIL Take 1 tablet (600 mg total) by mouth every 6 (six) hours as needed.   PRENATAL PO Take 1 tablet by mouth daily.   Vitamin D3 50 MCG (2000 UT) capsule 2,000 IU qd What changed:  how much to take how to take this when to take this additional instructions         Discharge home in stable condition Infant Feeding: Breast Infant Disposition:home with mother Discharge instruction: per After Visit Summary and Postpartum booklet. Activity: Advance as tolerated. Pelvic rest for 6 weeks.  Diet: routine diet Anticipated Birth Control:  n/a Postpartum Appointment:6 weeks Additional Postpartum F/U:  none Future Appointments: Future Appointments  Date Time Provider [place]  04/13/2022  1:30 PM Nelson Chimes, [facility] None  04/19/2022  9:00 AM Raliegh Scarlet Neoma Laming, DO [facility] None   Follow up Visit:  Follow-up Information     Servando Salina, MD Follow up in 6 week(s).   Specialty: Obstetrics and Gynecology Contact information: [address] [place] [address] [phone]                     03/24/2022 Marvene Staff, MD

## 2022-03-24 NOTE — Progress Notes (Signed)
PPD2 SVD:   S:  Pt reports feeling well. / Tolerating po/ Voiding without problems/ No n/v/ Bleeding is light/ Pain controlled withprescription NSAID's including ibuprofen (Motrin)  Newborn info live female   O:  A & O x 3 / VS: Blood pressure 111/64, pulse 90, temperature 98 F (36.7 C), temperature source Oral, resp. rate 18, height '5\' 9"'$  (1.753 m), weight (!) 166.6 kg, last menstrual period 04/15/2021, SpO2 100 %, unknown if currently breastfeeding.  LABS: No results found for this or any previous visit (from the past 24 hour(s)).  I&O: No intake/output data recorded.   No intake/output data recorded.  Lungs: chest clear, no wheezing, rales, normal symmetric air entry  Heart: regular rate and rhythm, S1, S2 normal, no murmur, click, rub or gallop  Abdomen: uterus firm @ umb   Perineum: is normal  Lochia: light  Extremities:no redness or tenderness in the calves or thighs, edema 1-2+    A/P: PPD # 2/ G1P1001  Doing well  Continue routine post partum orders  D/c instructed reviewed F/u 6 wk

## 2022-03-24 NOTE — Discharge Instructions (Signed)
Call if temperature greater than equal to 100.4, nothing per vagina for 4-6 weeks or severe nausea vomiting, increased incisional pain , drainage or redness in the incision site, no straining with bowel movements, showers no bath °

## 2022-03-31 ENCOUNTER — Telehealth (HOSPITAL_COMMUNITY): Payer: Self-pay | Admitting: *Deleted

## 2022-03-31 NOTE — Telephone Encounter (Signed)
Patient voiced no questions or concerns regarding her health at this time. EPDS=6. Patient voiced difficulties with getting a deep latch for breastfeeding. Stated that she has a lactation appointment scheduled for tomorrow. Voiced no other questions or concerns regarding infant at this time. Patient reports infant sleeps in a bassinet on his back. RN reviewed ABCs of safe sleep. Patient verbalized understanding. Patient requested RN email information on hospital's virtual postpartum classes and support groups. Email sent. Erline Levine, RN, 03/31/22, [phone]

## 2022-03-31 NOTE — Telephone Encounter (Signed)
Attempted hospital discharge follow-up call. Left message for patient to return RN call with any questions or concerns. Erline Levine, RN, 03/31/22, [phone]

## 2022-04-13 ENCOUNTER — Ambulatory Visit (INDEPENDENT_AMBULATORY_CARE_PROVIDER_SITE_OTHER): Payer: 59 | Admitting: Psychology

## 2022-04-13 DIAGNOSIS — F4322 Adjustment disorder with anxiety: Secondary | ICD-10-CM | POA: Diagnosis not present

## 2022-04-13 NOTE — Progress Notes (Signed)
[place] Counselor/Therapist Progress Note  Patient ID: [name], MRN: [id number],    Date: 04/13/2022  Time Spent: 1:30pm-2:09pm    Treatment Type: Individual Therapy  pt is seen for a virtual video visit via caregility.  Pt joins from her home, reporting privacy, and counselor from her home office.    Reported Symptoms: pt reports coping transition new mom to 58 week old, some anxiety/overwhelmed in first several days postpartum, some worry when poor sleep.  Mental Status Exam: Appearance:  Well Groomed     Behavior: Appropriate  Motor: Normal  Speech/Language:  Normal Rate  Affect: Appropriate  Mood: normal and some worries  Thought process: normal  Thought content:   WNL  Sensory/Perceptual disturbances:   WNL  Orientation: oriented to person, place, time/date, and situation  Attention: Good  Concentration: Good  Memory: WNL  Fund of knowledge:  Good  Insight:   Good  Judgment:  Good  Impulse Control: Good   Risk Assessment: Danger to Self:  No Self-injurious Behavior: No Danger to Others: No Duty to Warn:no Physical Aggression / Violence:No  Access to Firearms a concern: No  Gang Involvement:No   Subjective: Counselor assessed pt current functioning per pt report.  Processed w/pt transition w/ 54 week old son.  Screened for postpartum depression.  Assisted in validating and normalizing worries, transitions.  Discussed self care and giving self grace.  Explored supports and positives of connections.   Pt affect wnl.  Pt reported her son just turned 59 weeks old.  She was induced at 39 weeks for concern of marginal cord and had long induction/labor 36 hours and experienced edema- but her recovering postpartum and her son healthy.  Pt expressed surprised by anxiety experienced in first couple days postpartum, but that has settled mostly.  Pt reports she is tired, but overall adjusting.  Some struggles w/ breastfeeding and pt sought support and  improving and feels good about how coped through.  Pt discussed some worries and was able to normalized and validate for self.  Pt scored a 5 on Edinburgh Postnatal depression screen.  Pt discussed her positive supports system and new mom's to connect w/. Pt reported positive support from husband. Pt is on leave for 12 weeks and will return to work beginning of Jan 2024.  Interventions: Cognitive Behavioral Therapy and supportive  Diagnosis:Adjustment disorder with anxiety  Plan: Pt to f/u w/ counseling in a month- postpartum.  Pt to reach out if needed prior.  Pt to f/u as scheduled w/ OBGYN and PCP.  Individualized Treatment Plan Strengths: insightful, dependable, planner, compassionate  Supports: husband, her mom and twin sister, long time friends.   Goal/Needs for Treatment:  In order of importance to patient 1) managing mood and decreasing irritability- not compartmentalizing 2) expressing feelings and communicating effectively 3) navigating being a new mom   Client Statement of Needs: not compartmentalizing emotions, being able to manage moods and irritability experience, being able to communicate effectively and express my feelings and ask for what I need.  Navigating being a new mom and anxieties that come with.   Treatment Level:outpatient counseling  Symptoms:irritability, difficulty communicating feelings, worry about being new mom and adjusting.  Client Treatment Preferences:counseling about monthly.     [name]'s goal for treatment:  Counselor, Jan Fireman, [facility], [place] will support the patient's ability to achieve the goals identified. Cognitive Behavioral Therapy, Assertive Communication/Conflict Resolution Training, Relaxation Training, ACT, Humanistic and other evidenced-based practices will be used to promote progress towards  healthy functioning.   [name] will: Actively participate in therapy, working towards healthy functioning.    *Justification for  Continuation/Discontinuation of Goal: R=Revised, O=Ongoing, A=Achieved, D=Discontinued  Goal 1) Increase coping skills to manage stress and decrease irritability reactions. Baseline date 12/08/21: Progress towards goal 0; How Often - Daily Target Date Goal Was reviewed Status Code Progress towards goal  12/09/22                Goal 2)Pt to increase identifying/expressing feelings and improving effective communication in her relationships. Baseline date 12/08/21: Progress towards goal 0; How Often - Daily  Target Date Goal Was reviewed Status Code Progress towards goal/[place] rating  12/09/22                Goal 3) Assist pt in expressing feelings, stressors, positives of becoming a mom and adjusting to this role in her life. Baseline date 12/08/21: Progress towards goal 0; How Often - Daily  Target Date Goal Was reviewed Status Code Progress towards goal/[place] rating  12/09/22                This plan has been reviewed and created by the following participants:  This plan will be reviewed at least every 12 months. Date Behavioral Health Clinician Date Guardian/Patient   12/08/21  [facility]             12/08/21 Verbal Consent Provided            Jan Fireman, [facility]

## 2022-04-19 ENCOUNTER — Ambulatory Visit (INDEPENDENT_AMBULATORY_CARE_PROVIDER_SITE_OTHER): Payer: 59 | Admitting: Family Medicine

## 2022-04-19 ENCOUNTER — Encounter (INDEPENDENT_AMBULATORY_CARE_PROVIDER_SITE_OTHER): Payer: Self-pay | Admitting: Family Medicine

## 2022-04-19 VITALS — BP 118/79 | HR 66 | Temp 98.0°F | Ht 69.0 in | Wt 325.0 lb

## 2022-04-19 DIAGNOSIS — E669 Obesity, unspecified: Secondary | ICD-10-CM | POA: Diagnosis not present

## 2022-04-19 DIAGNOSIS — Z6841 Body Mass Index (BMI) 40.0 and over, adult: Secondary | ICD-10-CM

## 2022-04-19 DIAGNOSIS — E559 Vitamin D deficiency, unspecified: Secondary | ICD-10-CM | POA: Diagnosis not present

## 2022-04-19 DIAGNOSIS — R7303 Prediabetes: Secondary | ICD-10-CM

## 2022-04-19 DIAGNOSIS — Z789 Other specified health status: Secondary | ICD-10-CM | POA: Diagnosis not present

## 2022-04-28 NOTE — Progress Notes (Signed)
Chief Complaint:   OBESITY [name] is here to discuss her progress with her obesity treatment plan along with follow-up of her obesity related diagnoses. [name] is on keeping a food journal and adhering to recommended goals of 2400-2600 calories and 180+ grams of protein and states she is following her eating plan approximately 20-25% of the time. [name] states she is not currently exercising.  Today's visit was #: 45 Starting weight: 363 lbs Starting date: 11/02/2017 Today's weight: 325 lbs Today's date: 04/19/2022 Total lbs lost to date: 38 lbs Total lbs lost since last in-office visit: 0  Interim History: [name] is on maternity leave. She had [name] on 03/22/22. She is breast feeding and bottle feeding with her milk. 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  Subjective:   1. Breastfeeding (infant) [name]'s last IC was back in 2014. She has lost 40 lbs since then.  2. Pre-diabetes A1c within normal limits when last checked. No medication. Some cravings but not eating enough protein.   3. Vitamin D deficiency [name] is at goal at last check. On 5K IUs over the counter Vit D.  Assessment/Plan:  No orders of the defined types were placed in this encounter.   There are no discontinued medications.   No orders of the defined types were placed in this encounter.    1. Breastfeeding (infant) Repeat IC at next office visit to see what updated REE is and adjust meal plan accordingly.  2. Pre-diabetes [name] Nutritional Plan, journal, increase fiber, increase protein. Will check A1c, fasting insulin and BMP at next office visit.  3. Vitamin D deficiency We will check Vit D at next office visit.  4. Obesity,current BMI 48.1 [name] is currently in the action stage of change. As such, her goal is to continue with weight loss efforts. She has agreed to keeping a food journal and adhering to recommended goals of 2400-2600 calories and 180+ grams of  protein.   Recipe packet 1 and 2 given. She will follow Cat 4 but journal intake. She will work on meal prepping and getting back on plan.  Exercise goals: All adults should avoid inactivity. Some physical activity is better than none, and adults who participate in any amount of physical activity gain some health benefits.  Behavioral modification strategies: meal planning and cooking strategies and planning for success.  [name] has agreed to follow-up with our clinic in 4 weeks. She was informed of the importance of frequent follow-up visits to maximize her success with intensive lifestyle modifications for her multiple health conditions.   Objective:   Blood pressure 118/79, pulse 66, temperature 98 F (36.7 C), height '5\' 9"'$  (1.753 m), weight (!) 325 lb (147.4 kg), last menstrual period 04/15/2021, SpO2 99 %, unknown if currently breastfeeding. Body mass index is 47.99 kg/m.  General: Cooperative, alert, well developed, in no acute distress. HEENT: Conjunctivae and lids unremarkable. Cardiovascular: Regular rhythm.  Lungs: Normal work of breathing. Neurologic: No focal deficits.   Lab Results  Component Value Date   CREATININE 0.70 03/17/2021   BUN 19 03/17/2021   NA 140 03/17/2021   K 4.9 03/17/2021   CL 104 03/17/2021   CO2 23 03/17/2021   Lab Results  Component Value Date   ALT 20 03/17/2021   AST 13 03/17/2021   ALKPHOS 59 03/17/2021   BILITOT 0.3 03/17/2021   Lab Results  Component Value Date   HGBA1C 5.3 03/17/2021   HGBA1C 4.9 10/27/2020  HGBA1C 5.2 07/15/2020   HGBA1C 5.3 01/15/2020   HGBA1C 5.2 04/23/2019   Lab Results  Component Value Date   INSULIN 11.1 03/17/2021   INSULIN 5.3 07/15/2020   INSULIN 12.6 01/15/2020   INSULIN 14.6 04/23/2019   INSULIN 9.0 01/02/2019   Lab Results  Component Value Date   TSH 1.41 11/30/2018   Lab Results  Component Value Date   CHOL 208 (H) 03/17/2021   HDL 58 03/17/2021   LDLCALC 130 (H) 03/17/2021   TRIG  111 03/17/2021   CHOLHDL 3.6 03/17/2021   Lab Results  Component Value Date   VD25OH 53.7 10/27/2021   VD25OH 76.6 03/17/2021   VD25OH 49.8 07/15/2020   Lab Results  Component Value Date   WBC 11.7 (H) 03/23/2022   HGB 9.3 (L) 03/23/2022   HCT 29.1 (L) 03/23/2022   MCV 92.1 03/23/2022   PLT 201 03/23/2022   Lab Results  Component Value Date   IRON 30 11/02/2017   TIBC 337 11/02/2017   FERRITIN 28 11/02/2017   Attestation Statements:   Reviewed by clinician on day of visit: allergies, medications, problem list, medical history, surgical history, family history, social history, and previous encounter notes.  I, Brendell Tyus, am acting as Location manager for [employer], DO.   I have reviewed the above documentation for accuracy and completeness, and I agree with the above. Marjory Sneddon, D.O.  The [place] was signed into law in 2016 which includes the topic of electronic health records.  This provides immediate access to information in MyChart.  This includes consultation notes, operative notes, office notes, lab results and pathology reports.  If you have any questions about what you read please let us know at your next visit so we can discuss your concerns and take corrective action if need be.  We are right here with you.

## 2022-05-10 ENCOUNTER — Ambulatory Visit (INDEPENDENT_AMBULATORY_CARE_PROVIDER_SITE_OTHER): Payer: 59 | Admitting: Family Medicine

## 2022-05-10 ENCOUNTER — Encounter: Payer: Self-pay | Admitting: Family Medicine

## 2022-05-10 VITALS — BP 135/87 | HR 62 | Temp 98.3°F | Ht 68.11 in | Wt 324.4 lb

## 2022-05-10 DIAGNOSIS — Z0000 Encounter for general adult medical examination without abnormal findings: Secondary | ICD-10-CM | POA: Diagnosis not present

## 2022-05-10 DIAGNOSIS — Z8249 Family history of ischemic heart disease and other diseases of the circulatory system: Secondary | ICD-10-CM

## 2022-05-10 DIAGNOSIS — E7849 Other hyperlipidemia: Secondary | ICD-10-CM | POA: Diagnosis not present

## 2022-05-10 DIAGNOSIS — R7303 Prediabetes: Secondary | ICD-10-CM

## 2022-05-10 DIAGNOSIS — E559 Vitamin D deficiency, unspecified: Secondary | ICD-10-CM | POA: Diagnosis not present

## 2022-05-10 NOTE — Patient Instructions (Addendum)

## 2022-05-10 NOTE — Progress Notes (Signed)
Patient ID: LAILANIE HASLEY, female  DOB: 1986-01-01, 36 y.o.   MRN: [id number] Patient Care Team    Relationship Specialty Notifications Start End  Ma Hillock, DO PCP - General Family Medicine  04/25/15   Servando Salina, MD Consulting Physician Obstetrics and Gynecology  06/18/16   Wellington Hampshire, MD Consulting Physician Cardiology  06/18/16   Starlyn Skeans, MD Consulting Physician Family Medicine  05/26/18    Comment: Weight loss clinic    Chief Complaint  Patient presents with   Annual Exam    Pt not fasting    Subjective:  SHARDAY MICHL is a 36 y.o.  Female  present for CPE. All past medical history, surgical history, allergies, family history, immunizations, medications and social history were updated in the electronic medical record today. All recent labs, ED visits and hospitalizations within the last year were reviewed.  She had her baby, [name], last month.   Health maintenance: upd Colonoscopy: No Fhx. Screen 47 Mammogram: no fhx screen 42 - completed for breast mass 4/202/2023. By gyn Cervical cancer screening: last pap: 06/2018,by: Dr. Garwin Brothers- recent vaginal delivery 03/2022 Immunizations: tdap 05/2015 UTD, Influenza UTD 02/2022 (encouraged yearly),  Infectious disease screening: HIV UTD DEXA:  Consider early screen with BPES, low vit D.  Assistive device: none Oxygen KCL:EXNT Patient has a Dental home. Hospitalizations/ED visits: reviewed     03/23/2021    1:23 PM 10/27/2020    9:55 AM 02/13/2020    9:50 AM 05/22/2019    9:00 AM 11/30/2018   11:37 AM  Depression screen PHQ 2/9  Decreased Interest 0 0 0 0 0  Down, Depressed, Hopeless 0 0 0 0 0  PHQ - 2 Score 0 0 0 0 0  Altered sleeping 0      Tired, decreased energy 0      Change in appetite 0      Feeling bad or failure about yourself  0      Trouble concentrating 0      Moving slowly or fidgety/restless 0      Suicidal thoughts 0      PHQ-9 Score 0          03/23/2021    1:24 PM   GAD 7 : Generalized Anxiety Score  Nervous, Anxious, on Edge 0  Control/stop worrying 0  Worry too much - different things 0  Trouble relaxing 0  Restless 0  Easily annoyed or irritable 0  Afraid - awful might happen 0  Total GAD 7 Score 0     Immunization History  Administered Date(s) Administered   HPV 9-valent 08/01/2014, 09/10/2014, 01/28/2015   Influenza,inj,Quad PF,6+ Mos 03/21/2014, 02/25/2022   Influenza-Unspecified 03/22/2015, 03/15/2017, 04/06/2018, 03/16/2021   PFIZER(Purple Top)SARS-COV-2 Vaccination 06/18/2019, 07/07/2019, 03/28/2020   Tdap 08/01/2005, 06/09/2015    Past Medical History:  Diagnosis Date   Anemia    Back pain    BPES syndrome    Enlarged thyroid    Infertility, female    Knee dislocation 2010   Left   Marginal insertion of umbilical cord affecting management of mother in third trimester 03/20/2022   Obesity    Palpitations    PCOS (polycystic ovarian syndrome)    PONV (postoperative nausea and vomiting)    Prediabetes    Premature ovarian failure    Vitamin B 12 deficiency    Vitamin D deficiency    Wears eyeglasses    No Known Allergies Past Surgical History:  Procedure Laterality  Date   ADENOIDECTOMY     EYE SURGERY Bilateral    NASAL SEPTOPLASTY W/ TURBINOPLASTY Bilateral 03/04/2017   Procedure: NASAL SEPTOPLASTY WITH BILATERAL INFERIOR TURBINATE REDUCTION;  Surgeon: Jerrell Belfast, MD;  Location: [facility] OR;  Service: ENT;  Laterality: Bilateral;   NASAL SEPTUM SURGERY  03/04/2017   TONSILLECTOMY     TONSILLECTOMY AND ADENOIDECTOMY Bilateral 03/04/2017   Procedure: TONSILLECTOMY;  Surgeon: Jerrell Belfast, MD;  Location: [place];  Service: ENT;  Laterality: Bilateral;   Family History  Problem Relation Age of Onset   Hyperlipidemia Father    Hypertension Father    Heart disease Father    Sudden death Father        Age 22   Other Father        BPES   Heart attack Father    Other Other        BPES   Other Other        BPES    Hyperlipidemia Mother    Hypertension Mother    Diabetes Mother    Anxiety disorder Mother    Obesity Mother    Other Brother        BPES   Other Sister        BPES   Other Paternal Uncle        BPES   Other Cousin        BPES   Other Cousin        BPES   Heart attack Maternal Grandmother    Heart disease Maternal Grandfather    Heart attack Maternal Grandfather    Lung cancer Maternal Grandfather    Heart disease Paternal Grandfather heartr attack   Heart attack Paternal Grandfather 84       MI   Sudden death Paternal Grandfather    Social History   Social History Narrative   [name] lives alone. 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    Allergies as of 05/10/2022   No Known Allergies      Medication List        Accurate as of May 10, 2022  1:34 PM. If you have any questions, ask your nurse or doctor.          COLACE PO Take 1 capsule by mouth daily as needed (constipation).   ibuprofen 600 MG tablet Commonly known as: ADVIL Take 1 tablet (600 mg total) by mouth every 6 (six) hours as needed.   PRENATAL PO Take 1 tablet by mouth daily.   Vitamin D3 50 MCG (2000 UT) capsule 2,000 IU qd What changed:  how much to take how to take this when to take this additional instructions        All past medical history, surgical history, allergies, family history, immunizations andmedications were updated in the EMR today and reviewed under the history and medication portions of their EMR.      ROS: 14 pt review of systems performed and negative (unless mentioned in an HPI)  Objective: BP 135/87   Pulse 62   Temp 98.3 F (36.8 C)   Ht 5' 8.11" (1.73 m)   Wt (!) 324 lb 6.4 oz (147.1 kg)   LMP 04/15/2021 (Exact Date)   SpO2 100%   Breastfeeding Yes   BMI 49.17 kg/m  Physical Exam Vitals and nursing note reviewed.  Constitutional:      General: She is not in acute distress.    Appearance: Normal appearance.  She is not ill-appearing or toxic-appearing.  HENT:     Head: Normocephalic and atraumatic.     Right Ear: Tympanic membrane, ear canal and external ear normal. There is no impacted cerumen.     Left Ear: Tympanic membrane, ear canal and external ear normal. There is no impacted cerumen.     Nose: No congestion or rhinorrhea.     Mouth/Throat:     Mouth: Mucous membranes are moist.     Pharynx: Oropharynx is clear. No oropharyngeal exudate or posterior oropharyngeal erythema.  Eyes:     General: No scleral icterus.       Right eye: No discharge.        Left eye: No discharge.     Extraocular Movements: Extraocular movements intact.     Conjunctiva/sclera: Conjunctivae normal.     Pupils: Pupils are equal, round, and reactive to light.  Cardiovascular:     Rate and Rhythm: Normal rate and regular rhythm.     Pulses: Normal pulses.     Heart sounds: Normal heart sounds. No murmur heard.    No friction rub. No gallop.  Pulmonary:     Effort: Pulmonary effort is normal. No respiratory distress.     Breath sounds: Normal breath sounds. No stridor. No wheezing, rhonchi or rales.  Chest:     Chest wall: No tenderness.  Abdominal:     General: Abdomen is flat. Bowel sounds are normal. There is no distension.     Palpations: Abdomen is soft. There is no mass.     Tenderness: There is no abdominal tenderness. There is no right CVA tenderness, left CVA tenderness, guarding or rebound.     Hernia: No hernia is present.  Musculoskeletal:        General: No swelling, tenderness or deformity. Normal range of motion.     Cervical back: Normal range of motion and neck supple. No rigidity or tenderness.     Right lower leg: No edema.     Left lower leg: No edema.  Lymphadenopathy:     Cervical: No cervical adenopathy.  Skin:    General: Skin is warm and dry.     Coloration: Skin is not jaundiced or pale.     Findings: No bruising, erythema, lesion or rash.  Neurological:     General: No  focal deficit present.     Mental Status: She is alert and oriented to person, place, and time. Mental status is at baseline.     Cranial Nerves: No cranial nerve deficit.     Sensory: No sensory deficit.     Motor: No weakness.     Coordination: Coordination normal.     Gait: Gait normal.     Deep Tendon Reflexes: Reflexes normal.  Psychiatric:        Mood and Affect: Mood normal.        Behavior: Behavior normal.        Thought Content: Thought content normal.        Judgment: Judgment normal.     No results found.  Assessment/plan: JERELINE TICER is a 36 y.o. female present for CPE Vitamin D deficiency/Breast feeding status of mother Vitamin D collected today She is taking 2000 units daily of vit D3  hyperlipidemia, pure/FH: heart disease - CBC with Differential/Platelet - Comprehensive metabolic panel - Lipid panel - TSH Prediabetes - Hemoglobin A1c - currently off metformin with breast feeding.  Routine annual physical Colonoscopy: No Fhx. Screen 47 Mammogram: no fhx screen  43 - completed for breast mass 4/202/2023. By gyn Cervical cancer screening: last pap: 06/2018,by: Dr. Garwin Brothers- recent vaginal delivery 03/2022 Immunizations: tdap 05/2015 UTD, Influenza UTD 02/2022 (encouraged yearly),  Infectious disease screening: HIV UTD DEXA:  Consider early screen with BPES, low vit D.  Patient was encouraged to exercise greater than 150 minutes a week. Patient was encouraged to choose a diet filled with fresh fruits and vegetables, and lean meats. AVS provided to patient today for education/recommendation on gender specific health and safety maintenance.   Return in about 1 year (around 05/12/2023) for cpe (20 min).  Orders Placed This Encounter  Procedures   CBC with Differential/Platelet   Comprehensive metabolic panel   Hemoglobin A1c   Lipid panel   VITAMIN D 25 Hydroxy (Vit-D Deficiency, Fractures)   TSH     Annual exam completed today, as well as additional  > 10 minutes spent with patient, > 50% of that time face to face discussing acute on chronic knee pain.  Electronically signed by: Howard Pouch, DO [place]

## 2022-05-11 ENCOUNTER — Ambulatory Visit (INDEPENDENT_AMBULATORY_CARE_PROVIDER_SITE_OTHER): Payer: 59 | Admitting: Psychology

## 2022-05-11 DIAGNOSIS — F4322 Adjustment disorder with anxiety: Secondary | ICD-10-CM

## 2022-05-11 LAB — CBC WITH DIFFERENTIAL/PLATELET
Absolute Monocytes: 629 cells/uL (ref 200–950)
Basophils Absolute: 37 cells/uL (ref 0–200)
Basophils Relative: 0.5 %
Eosinophils Absolute: 281 cells/uL (ref 15–500)
Eosinophils Relative: 3.8 %
HCT: 37.1 % (ref 35.0–45.0)
Hemoglobin: 12.5 g/dL (ref 11.7–15.5)
Lymphs Abs: 2553 cells/uL (ref 850–3900)
MCH: 28.9 pg (ref 27.0–33.0)
MCHC: 33.7 g/dL (ref 32.0–36.0)
MCV: 85.9 fL (ref 80.0–100.0)
MPV: 11.2 fL (ref 7.5–12.5)
Monocytes Relative: 8.5 %
Neutro Abs: 3900 cells/uL (ref 1500–7800)
Neutrophils Relative %: 52.7 %
Platelets: 248 10*3/uL (ref 140–400)
RBC: 4.32 10*6/uL (ref 3.80–5.10)
RDW: 13.9 % (ref 11.0–15.0)
Total Lymphocyte: 34.5 %
WBC: 7.4 10*3/uL (ref 3.8–10.8)

## 2022-05-11 LAB — COMPREHENSIVE METABOLIC PANEL
AG Ratio: 1.7 (calc) (ref 1.0–2.5)
ALT: 39 U/L — ABNORMAL HIGH (ref 6–29)
AST: 24 U/L (ref 10–30)
Albumin: 4.1 g/dL (ref 3.6–5.1)
Alkaline phosphatase (APISO): 55 U/L (ref 31–125)
BUN: 21 mg/dL (ref 7–25)
CO2: 25 mmol/L (ref 20–32)
Calcium: 9.2 mg/dL (ref 8.6–10.2)
Chloride: 105 mmol/L (ref 98–110)
Creat: 0.63 mg/dL (ref 0.50–0.97)
Globulin: 2.4 g/dL (calc) (ref 1.9–3.7)
Glucose, Bld: 77 mg/dL (ref 65–99)
Potassium: 4.3 mmol/L (ref 3.5–5.3)
Sodium: 141 mmol/L (ref 135–146)
Total Bilirubin: 0.3 mg/dL (ref 0.2–1.2)
Total Protein: 6.5 g/dL (ref 6.1–8.1)

## 2022-05-11 LAB — HEMOGLOBIN A1C
Hgb A1c MFr Bld: 5.4 % of total Hgb (ref <5.7)
Mean Plasma Glucose: 108 mg/dL
eAG (mmol/L): 6 mmol/L

## 2022-05-11 LAB — TSH: TSH: 1.39 mIU/L

## 2022-05-11 LAB — LIPID PANEL
Cholesterol: 186 mg/dL (ref <200)
HDL: 61 mg/dL (ref >=50)
LDL Cholesterol (Calc): 110 mg/dL (calc) — ABNORMAL HIGH
Non-HDL Cholesterol (Calc): 125 mg/dL (calc) (ref <130)
Total CHOL/HDL Ratio: 3 (calc) (ref <5.0)
Triglycerides: 65 mg/dL (ref <150)

## 2022-05-11 LAB — VITAMIN D 25 HYDROXY (VIT D DEFICIENCY, FRACTURES): Vit D, 25-Hydroxy: 58 ng/mL (ref 30–100)

## 2022-05-11 NOTE — Progress Notes (Signed)
[place] Counselor/Therapist Progress Note  Patient ID: [name], MRN: [id number],    Date: 05/11/2022  Time Spent: 1:30pm-1:49pm    Treatment Type: Individual Therapy  pt is seen for a virtual video visit via caregility.  Pt joins from her home, reporting privacy, and counselor from her home office.    Reported Symptoms: pt reports she is doing well- anxiety is down and feeling positive w/ her natural supports.  Mental Status Exam: Appearance:  Well Groomed     Behavior: Appropriate  Motor: Normal  Speech/Language:  Normal Rate  Affect: Appropriate  Mood: normal  Thought process: normal  Thought content:   WNL  Sensory/Perceptual disturbances:   WNL  Orientation: oriented to person, place, time/date, and situation  Attention: Good  Concentration: Good  Memory: WNL  Fund of knowledge:  Good  Insight:   Good  Judgment:  Good  Impulse Control: Good   Risk Assessment: Danger to Self:  No Self-injurious Behavior: No Danger to Others: No Duty to Warn:no Physical Aggression / Violence:No  Access to Firearms a concern: No  Gang Involvement:No   Subjective: Counselor assessed pt current functioning per pt report.  Processed w/pt mood and adjustment w/ 44 week old son.  Reflected positives w/ pt support system and reaching out to supports as needed.  Discussed improvement w/ anxiety and norms of some doubt and at times flustered.  Discussed upcoming holiday plans and plans for retune to work after new year.  Pt affect wnl.  Pt reported her son is 12 weeks old.  Pt reports she is feeling good and not dealing w/ anxiety that had at first.  Pt reported that last week had some challenges w/ son more fussy and not wanting to sleep, but able to cope through and discussed how has been great to have supports of other new moms, sister.  Pt recognized norms for doubts at time and that feels more easily flustered some days.  Pt awareness that having newborn makes more  difficult to find moment for self. Pt would like to reach out if needed and recognizes may need to around return to work as coping through that transition.     Interventions: Cognitive Behavioral Therapy and supportive  Diagnosis:Adjustment disorder with anxiety  Plan: Pt to f/u w/ counseling as needed or when returns to work.  Pt to f/u as scheduled w/ PCP.  Individualized Treatment Plan Strengths: insightful, dependable, planner, compassionate  Supports: husband, her mom and twin sister, long time friends.   Goal/Needs for Treatment:  In order of importance to patient 1) managing mood and decreasing irritability- not compartmentalizing 2) expressing feelings and communicating effectively 3) navigating being a new mom   Client Statement of Needs: not compartmentalizing emotions, being able to manage moods and irritability experience, being able to communicate effectively and express my feelings and ask for what I need.  Navigating being a new mom and anxieties that come with.   Treatment Level:outpatient counseling  Symptoms:irritability, difficulty communicating feelings, worry about being new mom and adjusting.  Client Treatment Preferences:counseling about monthly.     [name]'s goal for treatment:  Counselor, Jan Fireman, [facility] will support the patient's ability to achieve the goals identified. Cognitive Behavioral Therapy, Assertive Communication/Conflict Resolution Training, Relaxation Training, ACT, Humanistic and other evidenced-based practices will be used to promote progress towards healthy functioning.   [name] will: Actively participate in therapy, working towards healthy functioning.    *Justification for Continuation/Discontinuation of Goal: R=Revised, O=Ongoing, A=Achieved,  D=Discontinued  Goal 1) Increase coping skills to manage stress and decrease irritability reactions. Baseline date 12/08/21: Progress towards goal 0; How Often -  Daily Target Date Goal Was reviewed Status Code Progress towards goal  12/09/22                Goal 2)Pt to increase identifying/expressing feelings and improving effective communication in her relationships. Baseline date 12/08/21: Progress towards goal 0; How Often - Daily  Target Date Goal Was reviewed Status Code Progress towards goal/[place]ik[place] rating  12/09/22                Goal 3) Assist pt in expressing feelings, stressors, positives of becoming a mom and adjusting to this role in her life. Baseline date 12/08/21: Progress towards goal 0; How Often - Daily  Target Date Goal Was reviewed Status Code Progress towards goal/Likert rating  12/09/22                This plan has been reviewed and created by the following participants:  This plan will be reviewed at least every 12 months. Date Behavioral Health Clinician Date Guardian/Patient   12/08/21  [facility]. [facility]             12/08/21 Verbal Consent Provided              Jan Fireman, [facility]

## 2022-05-19 ENCOUNTER — Ambulatory Visit (INDEPENDENT_AMBULATORY_CARE_PROVIDER_SITE_OTHER): Payer: 59 | Admitting: Family Medicine

## 2022-05-19 ENCOUNTER — Encounter (INDEPENDENT_AMBULATORY_CARE_PROVIDER_SITE_OTHER): Payer: Self-pay | Admitting: Family Medicine

## 2022-05-19 VITALS — BP 134/74 | HR 66 | Temp 97.9°F | Ht 69.0 in | Wt 317.0 lb

## 2022-05-19 DIAGNOSIS — R0602 Shortness of breath: Secondary | ICD-10-CM

## 2022-05-19 DIAGNOSIS — Z6841 Body Mass Index (BMI) 40.0 and over, adult: Secondary | ICD-10-CM

## 2022-05-19 DIAGNOSIS — E559 Vitamin D deficiency, unspecified: Secondary | ICD-10-CM | POA: Diagnosis not present

## 2022-05-19 DIAGNOSIS — E669 Obesity, unspecified: Secondary | ICD-10-CM

## 2022-05-31 NOTE — Progress Notes (Signed)
Chief Complaint:   OBESITY [name] is here to discuss her progress with her obesity treatment plan along with follow-up of her obesity related diagnoses. [name] is on keeping a food journal and adhering to recommended goals of 2400-2600 calories and 180+ grams of protein and states she is following her eating plan approximately 80% of the time. [name] states she is doing cardio for 30 minutes 2 times per week.  Today's visit was #: 73 Starting weight: 363 lbs Starting date: 11/02/2017 Today's weight: 317 lbs Today's date: 05/19/2022 Total lbs lost to date: 46 Total lbs lost since last in-office visit: 8  Interim History: [name] has done well with weight loss. She delivery her son 8 weeks ago and she is breastfeeding. She is working on increasing her protein.   Subjective:   1. SOBOE (shortness of breath on exertion) [name] recently delivered her son. No increase in shortness of breath and she notes some decrease in shortness of breath with weight loss.   2. Vitamin D deficiency [name] is on prenatal vitamins and OTC Vitamin D 2,000 IU daily.   Assessment/Plan:   1. SOBOE (shortness of breath on exertion) [name]'s repeat IC shows RMR has decreased significantly. She will work on increasing her protein and strengthening exercise.   2. Vitamin D deficiency [name] will continue her vitamins, and we will recheck labs in 2-3 months.   3. Obesity,Current BMI 46.8 [name] is currently in the action stage of change. As such, her goal is to continue with weight loss efforts. She has agreed to change to keeping a food journal and adhering to recommended goals of 1600-1700 calories and 120+ grams of protein daily.   Exercise goals: As is.   Behavioral modification strategies: holiday eating strategies .  [name] has agreed to follow-up with our clinic in 4 weeks. She was informed of the importance of frequent follow-up visits to maximize her success with intensive lifestyle  modifications for her multiple health conditions.   Objective:   Blood pressure 134/74, pulse 66, temperature 97.9 F (36.6 C), height 5\' 9"  (1.753 m), weight (!) 317 lb (143.8 kg), last menstrual period 04/15/2021, SpO2 99 %, currently breastfeeding. Body mass index is 46.81 kg/m.  General: Cooperative, alert, well developed, in no acute distress. HEENT: Conjunctivae and lids unremarkable. Cardiovascular: Regular rhythm.  Lungs: Normal work of breathing. Neurologic: No focal deficits.   Lab Results  Component Value Date   CREATININE 0.63 05/10/2022   BUN 21 05/10/2022   NA 141 05/10/2022   K 4.3 05/10/2022   CL 105 05/10/2022   CO2 25 05/10/2022   Lab Results  Component Value Date   ALT 39 (H) 05/10/2022   AST 24 05/10/2022   ALKPHOS 59 03/17/2021   BILITOT 0.3 05/10/2022   Lab Results  Component Value Date   HGBA1C 5.4 05/10/2022   HGBA1C 5.3 03/17/2021   HGBA1C 4.9 10/27/2020   HGBA1C 5.2 07/15/2020   HGBA1C 5.3 01/15/2020   Lab Results  Component Value Date   INSULIN 11.1 03/17/2021   INSULIN 5.3 07/15/2020   INSULIN 12.6 01/15/2020   INSULIN 14.6 04/23/2019   INSULIN 9.0 01/02/2019   Lab Results  Component Value Date   TSH 1.39 05/10/2022   Lab Results  Component Value Date   CHOL 186 05/10/2022   HDL 61 05/10/2022   LDLCALC 110 (H) 05/10/2022   TRIG 65 05/10/2022   CHOLHDL 3.0 05/10/2022   Lab Results  Component Value Date   VD25OH 62  05/10/2022   VD25OH 53.7 10/27/2021   VD25OH 76.6 03/17/2021   Lab Results  Component Value Date   WBC 7.4 05/10/2022   HGB 12.5 05/10/2022   HCT 37.1 05/10/2022   MCV 85.9 05/10/2022   PLT 248 05/10/2022   Lab Results  Component Value Date   IRON 30 11/02/2017   TIBC 337 11/02/2017   FERRITIN 28 11/02/2017   Attestation Statements:   Reviewed by clinician on day of visit: allergies, medications, problem list, medical history, surgical history, family history, social history, and previous encounter  notes.  Time spent on visit including pre-visit chart review and post-visit care and charting was 40 minutes.   I, Burt Knack, am acting as transcriptionist for Quillian Quince, MD.  I have reviewed the above documentation for accuracy and completeness, and I agree with the above. -  Quillian Quince, MD

## 2022-06-22 ENCOUNTER — Encounter (INDEPENDENT_AMBULATORY_CARE_PROVIDER_SITE_OTHER): Payer: Self-pay | Admitting: Physician Assistant

## 2022-06-22 ENCOUNTER — Ambulatory Visit (INDEPENDENT_AMBULATORY_CARE_PROVIDER_SITE_OTHER): Payer: 59 | Admitting: Physician Assistant

## 2022-06-22 VITALS — BP 120/79 | HR 64 | Temp 97.8°F | Ht 69.0 in | Wt 319.0 lb

## 2022-06-22 DIAGNOSIS — E669 Obesity, unspecified: Secondary | ICD-10-CM | POA: Diagnosis not present

## 2022-06-22 DIAGNOSIS — E559 Vitamin D deficiency, unspecified: Secondary | ICD-10-CM | POA: Diagnosis not present

## 2022-06-22 DIAGNOSIS — Z6841 Body Mass Index (BMI) 40.0 and over, adult: Secondary | ICD-10-CM | POA: Diagnosis not present

## 2022-07-04 NOTE — Progress Notes (Unsigned)
Chief Complaint:   OBESITY [name] is here to discuss her progress with her obesity treatment plan along with follow-up of her obesity related diagnoses. [name] is on keeping a food journal and adhering to recommended goals of 1600-1700 calories and 120 grams of protein and states she is following her eating plan approximately 50% of the time. [name] states she is cardio 30 minutes 2-3 times per week.  Today's visit was #: 79 Starting weight: 363 lbs Starting date: 5/015/2019 Today's weight: 319 lbs Today's date: 06/22/2021 Total lbs lost to date: 44 lbs Total lbs lost since last in-office visit: 0  Interim History: [name] has done well with weight loss overall.  Subjective:   1. Vitamin D deficiency [name] is taking over the counter Vit D3 2000 IU daily. Denies any side effects. Last level of 58 on 05/10/22.  Assessment/Plan:   1. Vitamin D deficiency Continue over the counter Vit D 2000 IU daily. Follow up with labs in the next couple of months.  2. Obesity,Current BMI 47.2 [name] is currently in the action stage of change. As such, her goal is to continue with weight loss efforts. She has agreed to keeping a food journal and adhering to recommended goals of 1600-1700 calories and 120+ grams of protein daily.   Exercise goals: As is. Adding strengthening.  Behavioral modification strategies: increasing lean protein intake, decreasing simple carbohydrates, no skipping meals, meal planning and cooking strategies, and planning for success.  [name] has agreed to follow-up with our clinic in 4 weeks. She was informed of the importance of frequent follow-up visits to maximize her success with intensive lifestyle modifications for her multiple health conditions.   Objective:   Blood pressure 120/79, pulse 64, temperature 97.8 F (36.6 C), height '5\' 9"'$  (1.753 m), weight (!) 319 lb (144.7 kg), SpO2 100 %, unknown if currently breastfeeding. Body mass index is 47.11  kg/m.  General: Cooperative, alert, well developed, in no acute distress. HEENT: Conjunctivae and lids unremarkable. Cardiovascular: Regular rhythm.  Lungs: Normal work of breathing. Neurologic: No focal deficits.   Lab Results  Component Value Date   CREATININE 0.63 05/10/2022   BUN 21 05/10/2022   NA 141 05/10/2022   K 4.3 05/10/2022   CL 105 05/10/2022   CO2 25 05/10/2022   Lab Results  Component Value Date   ALT 39 (H) 05/10/2022   AST 24 05/10/2022   ALKPHOS 59 03/17/2021   BILITOT 0.3 05/10/2022   Lab Results  Component Value Date   HGBA1C 5.4 05/10/2022   HGBA1C 5.3 03/17/2021   HGBA1C 4.9 10/27/2020   HGBA1C 5.2 07/15/2020   HGBA1C 5.3 01/15/2020   Lab Results  Component Value Date   INSULIN 11.1 03/17/2021   INSULIN 5.3 07/15/2020   INSULIN 12.6 01/15/2020   INSULIN 14.6 04/23/2019   INSULIN 9.0 01/02/2019   Lab Results  Component Value Date   TSH 1.39 05/10/2022   Lab Results  Component Value Date   CHOL 186 05/10/2022   HDL 61 05/10/2022   LDLCALC 110 (H) 05/10/2022   TRIG 65 05/10/2022   CHOLHDL 3.0 05/10/2022   Lab Results  Component Value Date   VD25OH 58 05/10/2022   VD25OH 53.7 10/27/2021   VD25OH 76.6 03/17/2021   Lab Results  Component Value Date   WBC 7.4 05/10/2022   HGB 12.5 05/10/2022   HCT 37.1 05/10/2022   MCV 85.9 05/10/2022   PLT 248 05/10/2022   Lab Results  Component Value Date  IRON 30 11/02/2017   TIBC 337 11/02/2017   FERRITIN 28 11/02/2017   Attestation Statements:   Reviewed by clinician on day of visit: allergies, medications, problem list, medical history, surgical history, family history, social history, and previous encounter notes.  I, Brendell Tyus, am acting as transcriptionist for [employer], [employer].  I have reviewed the above documentation for accuracy and completeness, and I agree with the above. -  ***

## 2022-07-20 ENCOUNTER — Encounter (INDEPENDENT_AMBULATORY_CARE_PROVIDER_SITE_OTHER): Payer: Self-pay | Admitting: Family Medicine

## 2022-07-20 ENCOUNTER — Ambulatory Visit (INDEPENDENT_AMBULATORY_CARE_PROVIDER_SITE_OTHER): Payer: 59 | Admitting: Family Medicine

## 2022-07-20 VITALS — BP 109/67 | HR 59 | Temp 97.4°F | Ht 69.0 in | Wt 315.0 lb

## 2022-07-20 DIAGNOSIS — Z6841 Body Mass Index (BMI) 40.0 and over, adult: Secondary | ICD-10-CM

## 2022-07-20 DIAGNOSIS — E7849 Other hyperlipidemia: Secondary | ICD-10-CM | POA: Diagnosis not present

## 2022-07-20 DIAGNOSIS — E669 Obesity, unspecified: Secondary | ICD-10-CM

## 2022-07-20 DIAGNOSIS — E559 Vitamin D deficiency, unspecified: Secondary | ICD-10-CM

## 2022-07-29 IMAGING — CT CT CARDIAC CORONARY ARTERY CALCIUM SCORE
2 series · 15 of 20 positions shown, 17 images · non-contrast
Comparison: None.
COMPARISON: None.

Addendum:
EXAM:
OVER-READ INTERPRETATION  CT CHEST

The following report is an over-read performed by radiologist Dr.
Nomasibulele Moatshe [REDACTED] on 10/01/2020. This
over-read does not include interpretation of cardiac or coronary
anatomy or pathology. The coronary calcium score interpretation by
the cardiologist is attached.
CLINICAL DATA: Cardiovascular Disease Risk stratification
Coronary Calcium Score
TECHNIQUE: A gated, non-contrast computed tomography scan of the heart was
performed using 3mm slice thickness. Axial images were analyzed on a
dedicated workstation. Calcium scoring of the coronary arteries was
performed using the Agatston method.

[Series 3: cascseq 2.0 b35f 70% · axial · 0.39mm/px · z∈[-264,-150]mm · 7 of 86 slices shown]
[im 10/86  vessel]
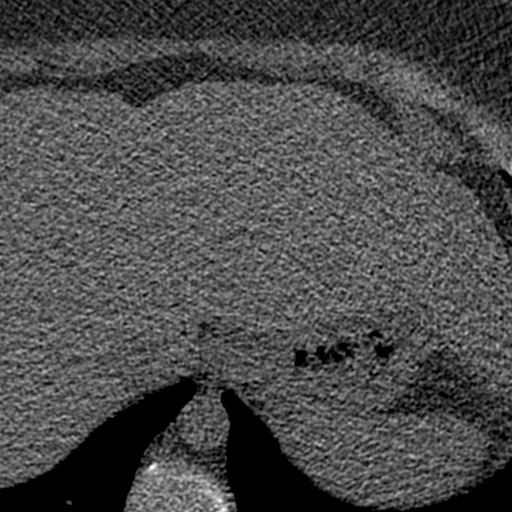
[im 19/86  vessel]
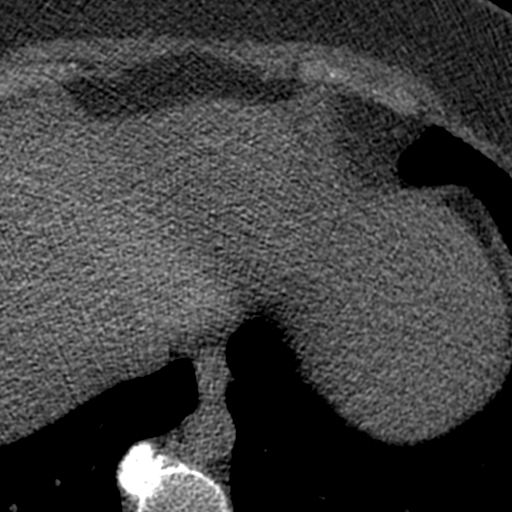
[im 29/86  vessel]
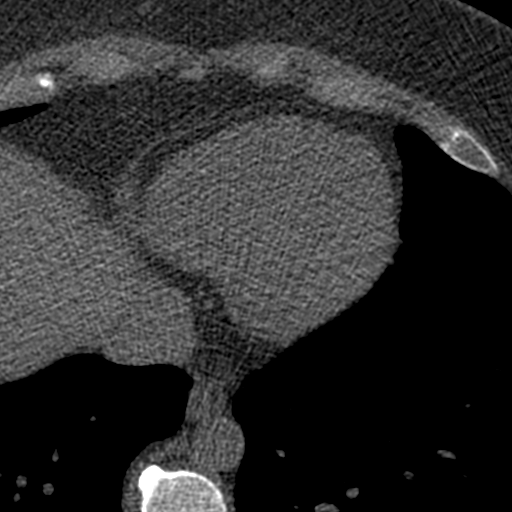
[im 38/86  vessel]
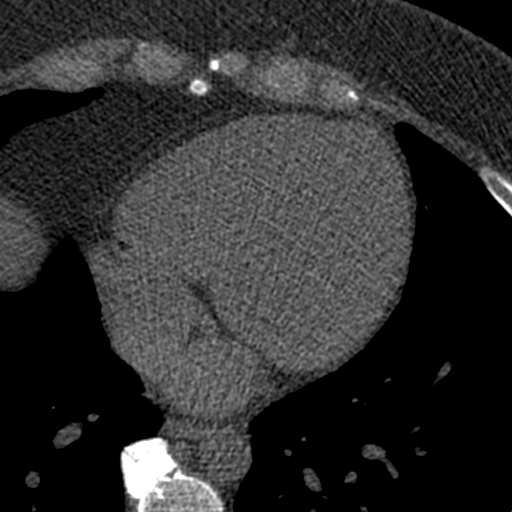
[im 48/86  vessel]
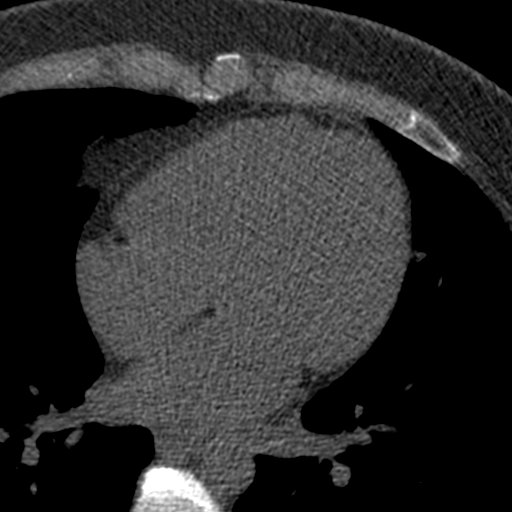
[im 57/86  vessel]
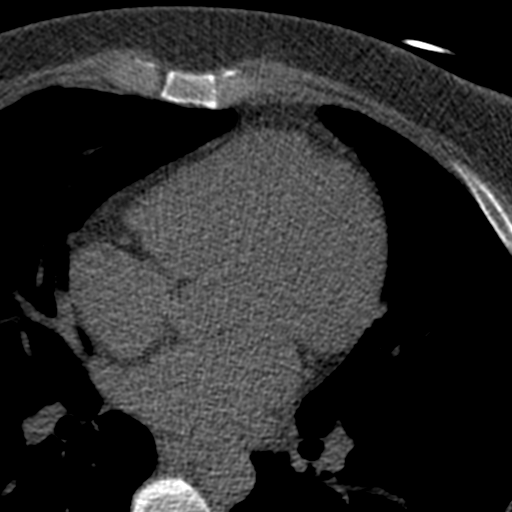
[im 67/86  vessel]
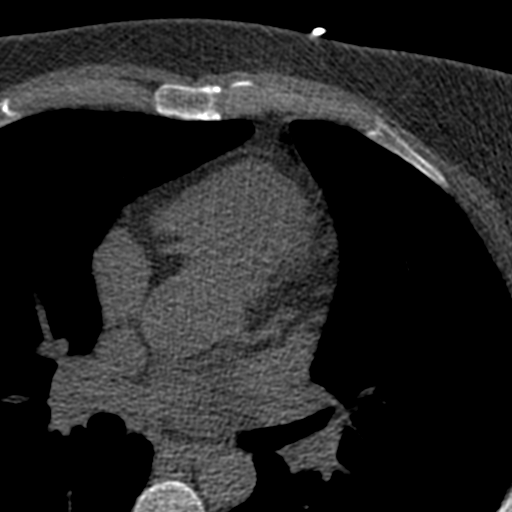

[Series 4: ax st full fov · axial · 0.79mm/px · z∈[-264,-132]mm · 8 of 86 slices shown, 10 images]
[im 10/86  vessel]
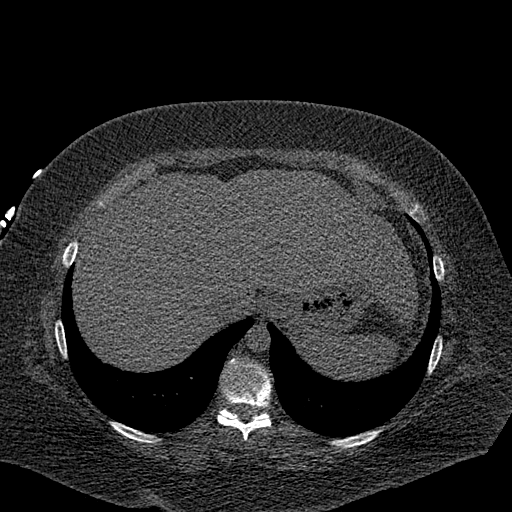
[im 10/86  lung]
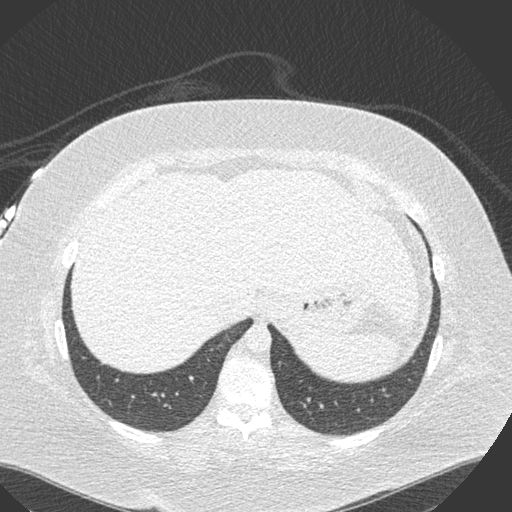
[im 19/86  vessel]
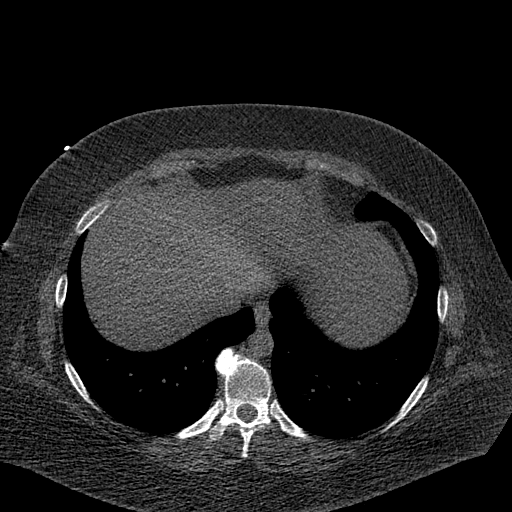
[im 29/86  vessel]
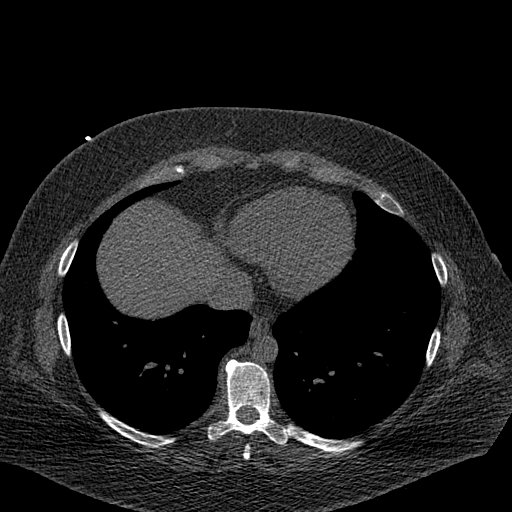
[im 38/86  vessel]
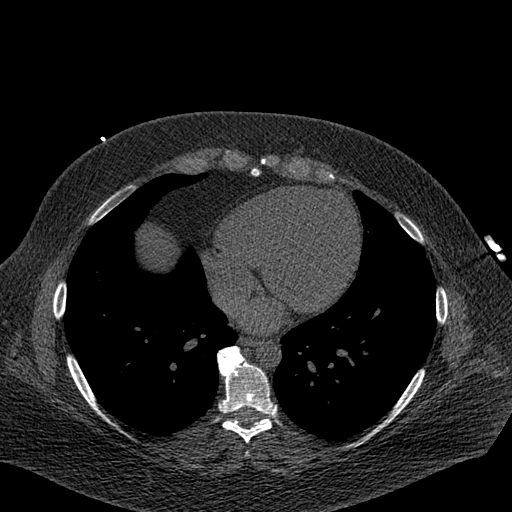
[im 48/86  vessel]
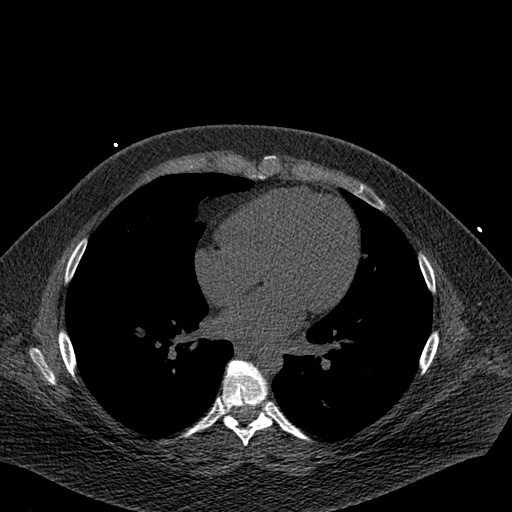
[im 48/86  lung]
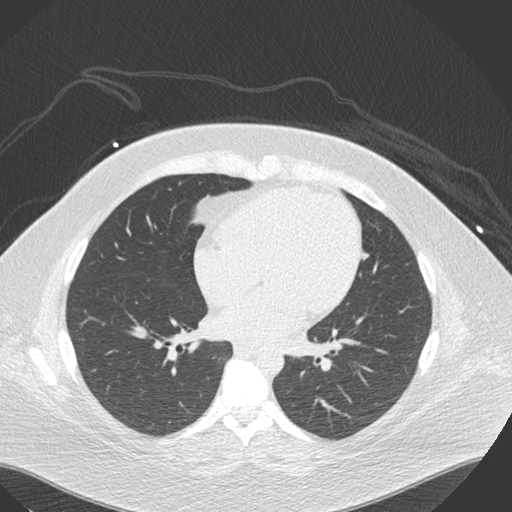
[im 57/86  vessel]
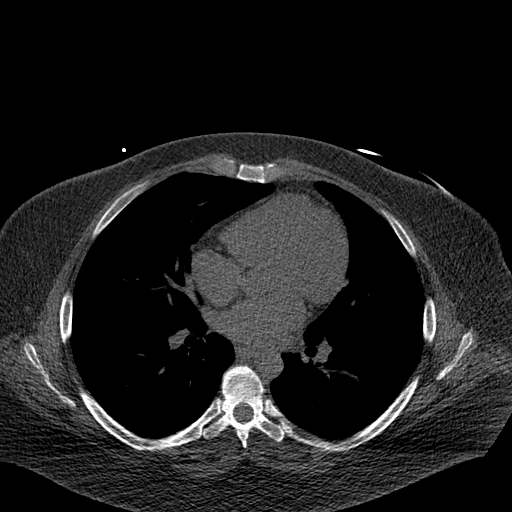
[im 67/86  vessel]
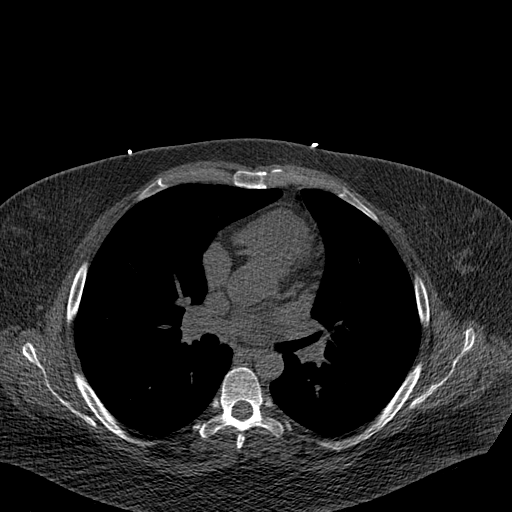
[im 76/86  vessel]
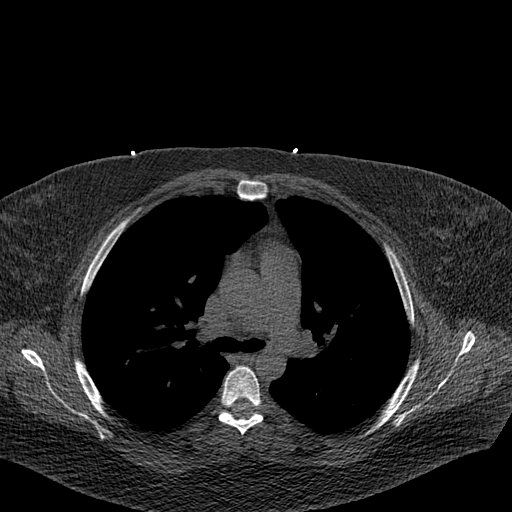

[15 of 20 positions shown; findings below may reference images not displayed]

FINDINGS: Within the visualized portions of the thorax there are no suspicious
appearing pulmonary nodules or masses, there is no acute
consolidative airspace disease, no pleural effusions, no
pneumothorax and no lymphadenopathy. Visualized portions of the
upper abdomen are unremarkable. There are no aggressive appearing
lytic or blastic lesions noted in the visualized portions of the
skeleton.
IMPRESSION: 1. No significant incidental noncardiac findings are noted.
FINDINGS: Coronary arteries: Normal origins.

Coronary Calcium Score:

Left main: 0

Left anterior descending artery: 0

Left circumflex artery: 0

Right coronary artery: 0

Total: 0

Percentile: 0

Pericardium: Normal.

Aorta: Normal caliber of ascending aorta. No aortic atherosclerosis
noted.

Non-cardiac: See separate report from [REDACTED].
IMPRESSION: Coronary calcium score of 0. This was 0 percentile for age-, race-,
and sex-matched controls.



If CAC=0, it is reasonable to withhold statin therapy and reassess
in 5 to 10 years, as long as higher risk conditions are absent
(diabetes mellitus, family history of premature CHD in first degree
relatives (males <55 years; females <65 years), cigarette smoking,
or LDL >=190 mg/dL).

If CAC is 1 to 99, it is reasonable to initiate statin therapy for
patients >=55 years of age.

If CAC is >=100 or >=75th percentile, it is reasonable to initiate
statin therapy at any age.

Cardiology referral should be considered for patients with CAC
scores >=400 or >=75th percentile.

*4423 AHA/ACC/AACVPR/AAPA/ABC/TZENG/MOOLMAN/MIKAL/Basurto/ELSTON/GI/TIGER
Guideline on the Management of Blood Cholesterol: A Report of the
American College of Cardiology/American Heart Association Task Force
on Clinical Practice Guidelines. J Am Coll Cardiol.
6277;73(24):7070-7974.

*** End of Addendum ***
EXAM:
OVER-READ INTERPRETATION  CT CHEST

The following report is an over-read performed by radiologist Dr.
Nomasibulele Moatshe [REDACTED] on 10/01/2020. This
over-read does not include interpretation of cardiac or coronary
anatomy or pathology. The coronary calcium score interpretation by
the cardiologist is attached.
FINDINGS: Within the visualized portions of the thorax there are no suspicious
appearing pulmonary nodules or masses, there is no acute
consolidative airspace disease, no pleural effusions, no
pneumothorax and no lymphadenopathy. Visualized portions of the
upper abdomen are unremarkable. There are no aggressive appearing
lytic or blastic lesions noted in the visualized portions of the
skeleton.
IMPRESSION: 1. No significant incidental noncardiac findings are noted.

## 2022-08-03 NOTE — Progress Notes (Signed)
Chief Complaint:   OBESITY [name] is here to discuss her progress with her obesity treatment plan along with follow-up of her obesity related diagnoses. [name] is on keeping a food journal and adhering to recommended goals of 1600-1700 calories and 120+ grams of protein and states she is following her eating plan approximately 70% of the time. [name] states she is doing cardio and strength for 30 minutes 4 times per week.  Today's visit was #: 16 Starting weight: 363 lbs Starting date: 11/02/2017 Today's weight: 315 lbs Today's date: 07/20/2022 Total lbs lost to date: 48 Total lbs lost since last in-office visit: 4  Interim History: [name] continues to do well with weight loss this month. Her son is 49 months old and she is not sleeping very well. She has also gotten back to work and she is very tired. She is struggling to prioritize her diet right now. She is still breastfeeding. She has increased her exercise.   Subjective:   1. Hyperlipidemia, pure Avilene's LDL continue to improve with diet, exercise, and weight loss. She is not on a statin (breastfeeding currently). She denies chest pain.   2. Vitamin D deficiency [name] is on prenatal vitamins and OTC Vitamin D 2,000 IU daily. Her last Vitamin D level was at goal. She has no signs of over-replacement.   Assessment/Plan:   1. Hyperlipidemia, pure [place] will continue with her diet and exercise. We discussed LDL goals today.   2. Vitamin D deficiency [name] will continue OTC Vitamin D, and we will plan to recheck her level in 1-2 months.   3. BMI 45.0-49.9, adult (HCC)  4. Obesity, Beginning BMI 55.19 [name] is currently in the action stage of change. As such, her goal is to continue with weight loss efforts. She has agreed to keeping a food journal and adhering to recommended goals of 1700 calories and 90+ grams of protein daily.   We discussed the importance of sleep for overall health as well as for weight  loss. Sleep strategies were reviewed.   Exercise goals: As is.   Behavioral modification strategies: increasing lean protein intake.  [name] has agreed to follow-up with our clinic in 4 weeks. She was informed of the importance of frequent follow-up visits to maximize her success with intensive lifestyle modifications for her multiple health conditions.   Objective:   Blood pressure 109/67, pulse (!) 59, temperature (!) 97.4 F (36.3 C), height 5' 9"$  (1.753 m), weight (!) 315 lb (142.9 kg), SpO2 99 %, unknown if currently breastfeeding. Body mass index is 46.52 kg/m.  General: Cooperative, alert, well developed, in no acute distress. HEENT: Conjunctivae and lids unremarkable. Cardiovascular: Regular rhythm.  Lungs: Normal work of breathing. Neurologic: No focal deficits.   Lab Results  Component Value Date   CREATININE 0.63 05/10/2022   BUN 21 05/10/2022   NA 141 05/10/2022   K 4.3 05/10/2022   CL 105 05/10/2022   CO2 25 05/10/2022   Lab Results  Component Value Date   ALT 39 (H) 05/10/2022   AST 24 05/10/2022   ALKPHOS 59 03/17/2021   BILITOT 0.3 05/10/2022   Lab Results  Component Value Date   HGBA1C 5.4 05/10/2022   HGBA1C 5.3 03/17/2021   HGBA1C 4.9 10/27/2020   HGBA1C 5.2 07/15/2020   HGBA1C 5.3 01/15/2020   Lab Results  Component Value Date   INSULIN 11.1 03/17/2021   INSULIN 5.3 07/15/2020   INSULIN 12.6 01/15/2020   INSULIN 14.6 04/23/2019   INSULIN  9.0 01/02/2019   Lab Results  Component Value Date   TSH 1.39 05/10/2022   Lab Results  Component Value Date   CHOL 186 05/10/2022   HDL 61 05/10/2022   LDLCALC 110 (H) 05/10/2022   TRIG 65 05/10/2022   CHOLHDL 3.0 05/10/2022   Lab Results  Component Value Date   VD25OH 58 05/10/2022   VD25OH 53.7 10/27/2021   VD25OH 76.6 03/17/2021   Lab Results  Component Value Date   WBC 7.4 05/10/2022   HGB 12.5 05/10/2022   HCT 37.1 05/10/2022   MCV 85.9 05/10/2022   PLT 248 05/10/2022   Lab  Results  Component Value Date   IRON 30 11/02/2017   TIBC 337 11/02/2017   FERRITIN 28 11/02/2017   Attestation Statements:   Reviewed by clinician on day of visit: allergies, medications, problem list, medical history, surgical history, family history, social history, and previous encounter notes.  Time spent on visit including pre-visit chart review and post-visit care and charting was 32 minutes.   I, Trixie Dredge, am acting as transcriptionist for Dennard Nip, MD.  I have reviewed the above documentation for accuracy and completeness, and I agree with the above. -  Dennard Nip, MD

## 2022-08-17 ENCOUNTER — Encounter (INDEPENDENT_AMBULATORY_CARE_PROVIDER_SITE_OTHER): Payer: Self-pay | Admitting: Family Medicine

## 2022-08-17 ENCOUNTER — Ambulatory Visit (INDEPENDENT_AMBULATORY_CARE_PROVIDER_SITE_OTHER): Payer: 59 | Admitting: Family Medicine

## 2022-08-17 VITALS — BP 116/74 | HR 64 | Temp 97.9°F | Ht 69.0 in | Wt 314.0 lb

## 2022-08-17 DIAGNOSIS — R7401 Elevation of levels of liver transaminase levels: Secondary | ICD-10-CM | POA: Insufficient documentation

## 2022-08-17 DIAGNOSIS — E669 Obesity, unspecified: Secondary | ICD-10-CM | POA: Diagnosis not present

## 2022-08-17 DIAGNOSIS — Z6841 Body Mass Index (BMI) 40.0 and over, adult: Secondary | ICD-10-CM | POA: Diagnosis not present

## 2022-09-06 NOTE — Progress Notes (Signed)
Chief Complaint:   OBESITY [name] is here to discuss her progress with her obesity treatment plan along with follow-up of her obesity related diagnoses. [name] is on keeping a food journal and adhering to recommended goals of 1700 calories and 90+ grams of protein and states she is following her eating plan approximately 75% of the time. [name] states she is doing cardio and strengthening for 30 minutes 4 times per week.  Today's visit was #: 50 Starting weight: 363 lbs Starting date: 11/02/2017 Today's weight: 314 lbs Today's date: 08/17/2022 Total lbs lost to date: 49 Total lbs lost since last in-office visit: 1  Interim History: [name] continues to work on her weight loss.  She has increased her exercise recently and she is trying to increase strengthening exercise.  Her goal this month is to be strict about her food journaling.  Subjective:   1. Elevated alanine aminotransferase (ALT) level Aisha's ALT is mildly elevated.  She continues to work on her weight loss.  She has a history of hormone replacement, and previous mild elevation in ALT.  She denies abdominal pain or jaundice.  Assessment/Plan:   1. Elevated alanine aminotransferase (ALT) level We will plan to recheck labs in 2 months.  This is likely due to not fatty liver disease, and Cass will continue to work on weight loss to treat and prevent worsening levels.  2. BMI 45.0-49.9, adult (HCC)  3. Obesity, Beginning BMI 55.19 [name] is currently in the action stage of change. As such, her goal is to continue with weight loss efforts. She has agreed to keeping a food journal and adhering to recommended goals of 1700 calories and 100+ grams of protein daily.   Exercise goals: As is.   Behavioral modification strategies: increasing lean protein intake and keeping a strict food journal.  [name] has agreed to follow-up with our clinic in 4 weeks. She was informed of the importance of frequent follow-up visits  to maximize her success with intensive lifestyle modifications for her multiple health conditions.   Objective:   Blood pressure 116/74, pulse 64, temperature 97.9 F (36.6 C), height 5\' 9"  (1.753 m), weight (!) 314 lb (142.4 kg), SpO2 97 %, unknown if currently breastfeeding. Body mass index is 46.37 kg/m.  Lab Results  Component Value Date   CREATININE 0.63 05/10/2022   BUN 21 05/10/2022   NA 141 05/10/2022   K 4.3 05/10/2022   CL 105 05/10/2022   CO2 25 05/10/2022   Lab Results  Component Value Date   ALT 39 (H) 05/10/2022   AST 24 05/10/2022   ALKPHOS 59 03/17/2021   BILITOT 0.3 05/10/2022   Lab Results  Component Value Date   HGBA1C 5.4 05/10/2022   HGBA1C 5.3 03/17/2021   HGBA1C 4.9 10/27/2020   HGBA1C 5.2 07/15/2020   HGBA1C 5.3 01/15/2020   Lab Results  Component Value Date   INSULIN 11.1 03/17/2021   INSULIN 5.3 07/15/2020   INSULIN 12.6 01/15/2020   INSULIN 14.6 04/23/2019   INSULIN 9.0 01/02/2019   Lab Results  Component Value Date   TSH 1.39 05/10/2022   Lab Results  Component Value Date   CHOL 186 05/10/2022   HDL 61 05/10/2022   LDLCALC 110 (H) 05/10/2022   TRIG 65 05/10/2022   CHOLHDL 3.0 05/10/2022   Lab Results  Component Value Date   VD25OH 58 05/10/2022   VD25OH 53.7 10/27/2021   VD25OH 76.6 03/17/2021   Lab Results  Component Value Date  WBC 7.4 05/10/2022   HGB 12.5 05/10/2022   HCT 37.1 05/10/2022   MCV 85.9 05/10/2022   PLT 248 05/10/2022   Lab Results  Component Value Date   IRON 30 11/02/2017   TIBC 337 11/02/2017   FERRITIN 28 11/02/2017   Attestation Statements:   Reviewed by clinician on day of visit: allergies, medications, problem list, medical history, surgical history, family history, social history, and previous encounter notes.  Time spent on visit including pre-visit chart review and post-visit care and charting was 30 minutes.   I, Trixie Dredge, am acting as transcriptionist for Dennard Nip,  MD.  I have reviewed the above documentation for accuracy and completeness, and I agree with the above. -  Dennard Nip, MD

## 2022-09-13 DIAGNOSIS — H5213 Myopia, bilateral: Secondary | ICD-10-CM | POA: Diagnosis not present

## 2022-09-14 ENCOUNTER — Encounter (INDEPENDENT_AMBULATORY_CARE_PROVIDER_SITE_OTHER): Payer: Self-pay | Admitting: Physician Assistant

## 2022-09-14 ENCOUNTER — Ambulatory Visit (INDEPENDENT_AMBULATORY_CARE_PROVIDER_SITE_OTHER): Payer: 59 | Admitting: Physician Assistant

## 2022-09-14 VITALS — BP 116/77 | HR 68 | Temp 97.9°F | Ht 69.0 in | Wt 308.0 lb

## 2022-09-14 DIAGNOSIS — R7401 Elevation of levels of liver transaminase levels: Secondary | ICD-10-CM | POA: Diagnosis not present

## 2022-09-14 DIAGNOSIS — Z6841 Body Mass Index (BMI) 40.0 and over, adult: Secondary | ICD-10-CM

## 2022-09-14 DIAGNOSIS — R7303 Prediabetes: Secondary | ICD-10-CM | POA: Diagnosis not present

## 2022-09-14 DIAGNOSIS — E669 Obesity, unspecified: Secondary | ICD-10-CM

## 2022-09-14 NOTE — Progress Notes (Signed)
Office: [phone]  /  Fax: [phone]  WEIGHT SUMMARY AND BIOMETRICS  Vitals Temp: 97.9 F (36.6 C) BP: 116/77 Pulse Rate: 68 SpO2: 100 %   Anthropometric Measurements Height: 5\' 9"  (1.753 m) Weight: (!) 308 lb (139.7 kg) BMI (Calculated): 45.46 Weight at Last Visit: 314 lb Weight Lost Since Last Visit: 6 lb Weight Gained Since Last Visit: 0 lb Starting Weight: 363 lb Total Weight Loss (lbs): 55 lb (24.9 kg)   Body Composition  Body Fat %: 51.5 % Fat Mass (lbs): 158.8 lbs Muscle Mass (lbs): 142 lbs Total Body Water (lbs): 111.2 lbs Visceral Fat Rating : 15   Other Clinical Data Fasting: No Labs: No Today's Visit #: 46 Starting Date: 11/02/17     HPI  Chief Complaint: OBESITY  [name] is here to discuss her progress with her obesity treatment plan. She is on the keeping a food journal and adhering to recommended goals of 1700 calories and 130 grams of protein and states she is following her eating plan approximately 80 % of the time. She states she is exercising/cardio/strength training 30 minutes 3-4 times per week.   Interval History:  Since last office visit she has done well with weight loss. Down another 6 lbs.  TBW loss 15.15%! Journaling is going better-wants to get to 100% of intake daily consistently.  Has been walking for exercise, but wants to add ankle weights. We also discussed using a weighted vest while walking and she is going to look into this.   Pharmacotherapy: None for weight loss.   PHYSICAL EXAM:  Blood pressure 116/77, pulse 68, temperature 97.9 F (36.6 C), height 5\' 9"  (1.753 m), weight (!) 308 lb (139.7 kg), SpO2 100 %, unknown if currently breastfeeding. Body mass index is 45.48 kg/m.  General: She is overweight, cooperative, alert, well developed, and in no acute distress. PSYCH: Has normal mood, affect and thought process.   Cardiovascular: regular rhythm Lungs: Normal breathing effort, no conversational  dyspnea. Neuro: no focal deficits  DIAGNOSTIC DATA REVIEWED:  BMET    Component Value Date/Time   NA 141 05/10/2022 1329   NA 140 03/17/2021 0813   K 4.3 05/10/2022 1329   CL 105 05/10/2022 1329   CO2 25 05/10/2022 1329   GLUCOSE 77 05/10/2022 1329   BUN 21 05/10/2022 1329   BUN 19 03/17/2021 0813   CREATININE 0.63 05/10/2022 1329   CALCIUM 9.2 05/10/2022 1329   GFRNONAA 116 07/15/2020 1448   GFRAA 134 07/15/2020 1448   Lab Results  Component Value Date   HGBA1C 5.4 05/10/2022   HGBA1C 5.7 09/10/2014   Lab Results  Component Value Date   INSULIN 11.1 03/17/2021   INSULIN 35.6 (H) 11/02/2017   Lab Results  Component Value Date   TSH 1.39 05/10/2022   CBC    Component Value Date/Time   WBC 7.4 05/10/2022 1329   RBC 4.32 05/10/2022 1329   HGB 12.5 05/10/2022 1329   HGB 11.8 11/02/2017 0904   HCT 37.1 05/10/2022 1329   HCT 36.7 11/02/2017 0904   PLT 248 05/10/2022 1329   MCV 85.9 05/10/2022 1329   MCV 84 11/02/2017 0904   MCH 28.9 05/10/2022 1329   MCHC 33.7 05/10/2022 1329   RDW 13.9 05/10/2022 1329   RDW 15.1 11/02/2017 0904   Iron Studies    Component Value Date/Time   IRON 30 11/02/2017 0904   TIBC 337 11/02/2017 0904   FERRITIN 28 11/02/2017 0904   IRONPCTSAT 9 (LL) 11/02/2017 0904  Lipid Panel     Component Value Date/Time   CHOL 186 05/10/2022 1329   CHOL 208 (H) 03/17/2021 0813   TRIG 65 05/10/2022 1329   HDL 61 05/10/2022 1329   HDL 58 03/17/2021 0813   CHOLHDL 3.0 05/10/2022 1329   VLDL 20.6 05/25/2017 0843   LDLCALC 110 (H) 05/10/2022 1329   Hepatic Function Panel     Component Value Date/Time   PROT 6.5 05/10/2022 1329   PROT 7.1 03/17/2021 0813   ALBUMIN 4.4 03/17/2021 0813   AST 24 05/10/2022 1329   ALT 39 (H) 05/10/2022 1329   ALKPHOS 59 03/17/2021 0813   BILITOT 0.3 05/10/2022 1329   BILITOT 0.3 03/17/2021 0813      Component Value Date/Time   TSH 1.39 05/10/2022 1329   Nutritional Lab Results  Component Value Date    VD25OH 58 05/10/2022   VD25OH 53.7 10/27/2021   VD25OH 76.6 03/17/2021    ASSOCIATED CONDITIONS ADDRESSED TODAY  ASSESSMENT AND PLAN  Problem List Items Addressed This Visit     Prediabetes - Primary    Prediabetes Last A1c was 5.4  Medication(s): None She is working on nutrition plan to decrease simple carbohydrates, increase lean proteins and exercise to promote weight loss, improve glycemic control and prevent progression to Type 2 diabetes.   Lab Results  Component Value Date   HGBA1C 5.4 05/10/2022   HGBA1C 5.3 03/17/2021   HGBA1C 4.9 10/27/2020   HGBA1C 5.2 07/15/2020   HGBA1C 5.3 01/15/2020   Lab Results  Component Value Date   INSULIN 11.1 03/17/2021   INSULIN 5.3 07/15/2020   INSULIN 12.6 01/15/2020   INSULIN 14.6 04/23/2019   INSULIN 9.0 01/02/2019   Plan: Continue working on nutrition plan to decrease simple carbohydrates, increase lean proteins and exercise to promote weight loss, improve glycemic control and prevent progression to Type 2 diabetes.  Recheck fasting labs next visit.          BMI 45.0-49.9, adult (HCC)   Obesity, Beginning BMI 55.19   Elevated alanine aminotransferase (ALT) level    Patient has two of more risk factors for MASLD. Liver enzymes are elevated. Most recent imaging includes :None specifically for liver imaging. Denies use of supplements, heavy alcohol consumption or steatogenic medications.   Plan:  Losing 15 % of bodyweight which the patient has done,  may lower risk.  Recommended that she avoid processed foods, simple sugars, heavy alcohol consumption and steatogenic medications. She may also be a candidate for incretin therapy if not cost prohibitive.    Will plan to recheck labs next visit.  If remains elevated, may want to proceed with first tier work-up to exclude other causes. This will include hepatitis serologies, iron studies, ceruloplasmin  (if less than 51 y/o), ATT, TTG-IgA, anti-mitochondrial ab.  Immunoglobulin  G (IgG) level, antinuclear antibody, anti-smooth muscle antibody (for female patients and/or those with aminotransferases >5 times the upper limit of normal or history of autoimmune disease).           TREATMENT PLAN FOR OBESITY:  Recommended Dietary Goals  [name] is currently in the action stage of change. As such, her goal is to continue weight management plan. She has agreed to keeping a food journal and adhering to recommended goals of 1700 calories and 90+ grams of protein.  Behavioral Intervention  We discussed the following Behavioral Modification Strategies today: increasing lean protein intake, decreasing simple carbohydrates , increasing vegetables, avoiding skipping meals, increasing water intake, work on Interior and spatial designer  calories using tracking App, planning for success, and keeping healthy foods at home.  Additional resources provided today: NA  Recommended Physical Activity Goals  [name] has been advised to work up to 150 minutes of moderate intensity aerobic activity a week and strengthening exercises 2-3 times per week for cardiovascular health, weight loss maintenance and preservation of muscle mass.   She has agreed to Continue current level of physical activity     Return in about 4 weeks (around 10/12/2022).[name] She was informed of the importance of frequent follow up visits to maximize her success with intensive lifestyle modifications for her multiple health conditions.   ATTESTASTION STATEMENTS:  Reviewed by clinician on day of visit: allergies, medications, problem list, medical history, surgical history, family history, social history, and previous encounter notes.   I have personally spent 36 minutes total time today in preparation, patient care, nutritional counseling and documentation for this visit, including the following: review of clinical lab tests; review of medical tests/procedures/services.      Janiel Crisostomo, PA-C

## 2022-09-14 NOTE — Assessment & Plan Note (Addendum)
Prediabetes Last A1c was 5.4  Medication(s): None She is working on nutrition plan to decrease simple carbohydrates, increase lean proteins and exercise to promote weight loss, improve glycemic control and prevent progression to Type 2 diabetes.   Lab Results  Component Value Date   HGBA1C 5.4 05/10/2022   HGBA1C 5.3 03/17/2021   HGBA1C 4.9 10/27/2020   HGBA1C 5.2 07/15/2020   HGBA1C 5.3 01/15/2020   Lab Results  Component Value Date   INSULIN 11.1 03/17/2021   INSULIN 5.3 07/15/2020   INSULIN 12.6 01/15/2020   INSULIN 14.6 04/23/2019   INSULIN 9.0 01/02/2019    Plan: Continue working on nutrition plan to decrease simple carbohydrates, increase lean proteins and exercise to promote weight loss, improve glycemic control and prevent progression to Type 2 diabetes.  Recheck fasting labs next visit.

## 2022-09-14 NOTE — Assessment & Plan Note (Addendum)
Patient has two of more risk factors for MASLD. Liver enzymes are elevated. Most recent imaging includes :None specifically for liver imaging. Denies use of supplements, heavy alcohol consumption or steatogenic medications.   Plan:  Losing 15 % of bodyweight which the patient has done,  may lower risk.  Recommended that she avoid processed foods, simple sugars, heavy alcohol consumption and steatogenic medications. She may also be a candidate for incretin therapy if not cost prohibitive.    Will plan to recheck labs next visit.  If remains elevated, may want to proceed with first tier work-up to exclude other causes. This will include hepatitis serologies, iron studies, ceruloplasmin  (if less than 37 y/o), ATT, TTG-IgA, anti-mitochondrial ab.  Immunoglobulin G (IgG) level, antinuclear antibody, anti-smooth muscle antibody (for female patients and/or those with aminotransferases >5 times the upper limit of normal or history of autoimmune disease).

## 2022-10-12 ENCOUNTER — Ambulatory Visit (INDEPENDENT_AMBULATORY_CARE_PROVIDER_SITE_OTHER): Payer: 59 | Admitting: Family Medicine

## 2022-10-12 ENCOUNTER — Encounter (INDEPENDENT_AMBULATORY_CARE_PROVIDER_SITE_OTHER): Payer: Self-pay | Admitting: Family Medicine

## 2022-10-12 VITALS — BP 110/73 | HR 65 | Temp 97.3°F | Ht 69.0 in | Wt 310.0 lb

## 2022-10-12 DIAGNOSIS — E559 Vitamin D deficiency, unspecified: Secondary | ICD-10-CM

## 2022-10-12 DIAGNOSIS — R7303 Prediabetes: Secondary | ICD-10-CM | POA: Diagnosis not present

## 2022-10-12 DIAGNOSIS — R7401 Elevation of levels of liver transaminase levels: Secondary | ICD-10-CM

## 2022-10-12 DIAGNOSIS — Z6841 Body Mass Index (BMI) 40.0 and over, adult: Secondary | ICD-10-CM | POA: Diagnosis not present

## 2022-10-12 DIAGNOSIS — E669 Obesity, unspecified: Secondary | ICD-10-CM | POA: Diagnosis not present

## 2022-10-12 DIAGNOSIS — E7849 Other hyperlipidemia: Secondary | ICD-10-CM

## 2022-10-12 NOTE — Progress Notes (Unsigned)
Chief Complaint:   OBESITY [name] is here to discuss her progress with her obesity treatment plan along with follow-up of her obesity related diagnoses. [name] is on keeping a food journal and adhering to recommended goals of 1700 calories and 90+ grams of protein and states she is following her eating plan approximately 50% of the time. [name] states she is doing cardio and strength for 30 minutes 2-3 times per week.  Today's visit was #: 78 Starting weight: 363 lbs Starting date: 11/02/2017 Today's weight: 310 lbs Today's date: 10/12/2022 Total lbs lost to date: 53 Total lbs lost since last in-office visit: 0  Interim History: [name] has been struggling to meal plan.  She has been especially busy recently.  She notes decreased hunger when she increases her protein.  She tries to journal.  Subjective:   1. Other hyperlipidemia, pure [name] is working on her diet, and she is not on statin.  She is due to have labs.  2. Prediabetes [name] is working on decreasing carbohydrates and sugars in her diet.  She is not on metformin, and she is due for labs.  3. Vitamin D deficiency [name] is on vitamin D OTC 2000 units daily, and she is due to have her labs rechecked.  4. Elevated alanine aminotransferase (ALT) level [name] has a history of mildly elevated ALT.  She denies abdominal pain or jaundice.  Assessment/Plan:   1. Other hyperlipidemia, pure We will check labs today, and [name] will continue to work on her diet.  - Lipid Panel With LDL/HDL Ratio - TSH  2. Prediabetes We will check labs today, and [name] will continue to work on her diet and exercise.  - CMP14+EGFR - Insulin, random - Hemoglobin A1c - Vitamin B12  3. Vitamin D deficiency We will check labs today, and we will follow-up at [facility] next visit.  - VITAMIN D 25 Hydroxy (Vit-D Deficiency, Fractures)  4. Elevated alanine aminotransferase (ALT) level We will check labs today, and we will  follow-up at [facility] next visit.  - CMP14+EGFR  5. BMI 45.0-49.9, adult (HCC) Current BMI 45.5  6. Obesity, Beginning BMI 55.19 [name] is currently in the action stage of change. As such, her goal is to continue with weight loss efforts. She has agreed to following a lower carbohydrate, vegetable and lean protein rich diet plan.   Exercise goals: As is.   Behavioral modification strategies: increasing lean protein intake.  [name] has agreed to follow-up with our clinic in 4 weeks. She was informed of the importance of frequent follow-up visits to maximize her success with intensive lifestyle modifications for her multiple health conditions.   [name] was informed we would discuss her lab results at her next visit unless there is a critical issue that needs to be addressed sooner. [name] agreed to keep her next visit at the agreed upon time to discuss these results.  Objective:   Blood pressure 110/73, pulse 65, temperature (!) 97.3 F (36.3 C), height  (1.753 m), weight (!) 310 lb (140.6 kg), SpO2 97 %, unknown if currently breastfeeding. Body mass index is 45.78 kg/m.  Lab Results  Component Value Date   CREATININE 0.63 05/10/2022   BUN 21 05/10/2022   NA 141 05/10/2022   K 4.3 05/10/2022   CL 105 05/10/2022   CO2 25 05/10/2022   Lab Results  Component Value Date   ALT 39 (H) 05/10/2022   AST 24 05/10/2022   ALKPHOS 59 03/17/2021   BILITOT 0.3  05/10/2022   Lab Results  Component Value Date   HGBA1C 5.4 05/10/2022   HGBA1C 5.3 03/17/2021   HGBA1C 4.9 10/27/2020   HGBA1C 5.2 07/15/2020   HGBA1C 5.3 01/15/2020   Lab Results  Component Value Date   INSULIN 11.1 03/17/2021   INSULIN 5.3 07/15/2020   INSULIN 12.6 01/15/2020   INSULIN 14.6 04/23/2019   INSULIN 9.0 01/02/2019   Lab Results  Component Value Date   TSH 1.39 05/10/2022   Lab Results  Component Value Date   CHOL 186 05/10/2022   HDL 61 05/10/2022   LDLCALC 110 (H) 05/10/2022   TRIG 65  05/10/2022   CHOLHDL 3.0 05/10/2022   Lab Results  Component Value Date   VD25OH 58 05/10/2022   VD25OH 53.7 10/27/2021   VD25OH 76.6 03/17/2021   Lab Results  Component Value Date   WBC 7.4 05/10/2022   HGB 12.5 05/10/2022   HCT 37.1 05/10/2022   MCV 85.9 05/10/2022   PLT 248 05/10/2022   Lab Results  Component Value Date   IRON 30 11/02/2017   TIBC 337 11/02/2017   FERRITIN 28 11/02/2017   Attestation Statements:   Reviewed by clinician on day of visit: allergies, medications, problem list, medical history, surgical history, family history, social history, and previous encounter notes.   I, Burt Knack, am acting as transcriptionist for Quillian Quince, MD.  I have reviewed the above documentation for accuracy and completeness, and I agree with the above. -  Quillian Quince, MD

## 2022-10-13 LAB — CMP14+EGFR
ALT: 16 [IU]/L (ref 0–32)
AST: 13 [IU]/L (ref 0–40)
Albumin/Globulin Ratio: 1.8 (ref 1.2–2.2)
Albumin: 4.2 g/dL (ref 3.9–4.9)
Alkaline Phosphatase: 74 [IU]/L (ref 44–121)
BUN/Creatinine Ratio: 29 — ABNORMAL HIGH (ref 9–23)
BUN: 19 mg/dL (ref 6–20)
Bilirubin Total: 0.3 mg/dL (ref 0.0–1.2)
CO2: 22 mmol/L (ref 20–29)
Calcium: 9.6 mg/dL (ref 8.7–10.2)
Chloride: 104 mmol/L (ref 96–106)
Creatinine, Ser: 0.65 mg/dL (ref 0.57–1.00)
Globulin, Total: 2.3 g/dL (ref 1.5–4.5)
Glucose: 87 mg/dL (ref 70–99)
Potassium: 4.6 mmol/L (ref 3.5–5.2)
Sodium: 141 mmol/L (ref 134–144)
Total Protein: 6.5 g/dL (ref 6.0–8.5)
eGFR: 118 mL/min/{1.73_m2}

## 2022-10-13 LAB — INSULIN, RANDOM: INSULIN: 8.4 u[IU]/mL (ref 2.6–24.9)

## 2022-10-13 LAB — LIPID PANEL WITH LDL/HDL RATIO
Cholesterol, Total: 192 mg/dL (ref 100–199)
HDL: 60 mg/dL
LDL Chol Calc (NIH): 123 mg/dL — ABNORMAL HIGH (ref 0–99)
LDL/HDL Ratio: 2.1 ratio (ref 0.0–3.2)
Triglycerides: 50 mg/dL (ref 0–149)
VLDL Cholesterol Cal: 9 mg/dL (ref 5–40)

## 2022-10-13 LAB — TSH: TSH: 1.66 u[IU]/mL (ref 0.450–4.500)

## 2022-10-13 LAB — HEMOGLOBIN A1C
Est. average glucose Bld gHb Est-mCnc: 108 mg/dL
Hgb A1c MFr Bld: 5.4 % (ref 4.8–5.6)

## 2022-10-13 LAB — VITAMIN D 25 HYDROXY (VIT D DEFICIENCY, FRACTURES): Vit D, 25-Hydroxy: 57.3 ng/mL (ref 30.0–100.0)

## 2022-10-13 LAB — VITAMIN B12: Vitamin B-12: 1031 pg/mL (ref 232–1245)

## 2022-10-26 DIAGNOSIS — Q100 Congenital ptosis: Secondary | ICD-10-CM | POA: Diagnosis not present

## 2022-10-26 DIAGNOSIS — Z6841 Body Mass Index (BMI) 40.0 and over, adult: Secondary | ICD-10-CM | POA: Diagnosis not present

## 2022-10-26 DIAGNOSIS — Z01419 Encounter for gynecological examination (general) (routine) without abnormal findings: Secondary | ICD-10-CM | POA: Diagnosis not present

## 2022-10-26 DIAGNOSIS — Q8789 Other specified congenital malformation syndromes, not elsewhere classified: Secondary | ICD-10-CM | POA: Diagnosis not present

## 2022-10-26 DIAGNOSIS — E2839 Other primary ovarian failure: Secondary | ICD-10-CM | POA: Diagnosis not present

## 2022-11-09 ENCOUNTER — Ambulatory Visit (INDEPENDENT_AMBULATORY_CARE_PROVIDER_SITE_OTHER): Payer: 59 | Admitting: Physician Assistant

## 2022-11-09 ENCOUNTER — Encounter (INDEPENDENT_AMBULATORY_CARE_PROVIDER_SITE_OTHER): Payer: Self-pay | Admitting: Physician Assistant

## 2022-11-09 VITALS — BP 122/67 | HR 61 | Temp 97.9°F | Ht 69.0 in | Wt 309.0 lb

## 2022-11-09 DIAGNOSIS — R7303 Prediabetes: Secondary | ICD-10-CM | POA: Diagnosis not present

## 2022-11-09 DIAGNOSIS — E669 Obesity, unspecified: Secondary | ICD-10-CM | POA: Diagnosis not present

## 2022-11-09 DIAGNOSIS — Z6841 Body Mass Index (BMI) 40.0 and over, adult: Secondary | ICD-10-CM

## 2022-11-09 DIAGNOSIS — E7849 Other hyperlipidemia: Secondary | ICD-10-CM

## 2022-11-09 DIAGNOSIS — R7401 Elevation of levels of liver transaminase levels: Secondary | ICD-10-CM

## 2022-11-09 DIAGNOSIS — E559 Vitamin D deficiency, unspecified: Secondary | ICD-10-CM | POA: Diagnosis not present

## 2022-11-09 NOTE — Progress Notes (Signed)
.smr  Office: [phone]  /  Fax: [phone]  WEIGHT SUMMARY AND BIOMETRICS  Vitals Temp: 97.9 F (36.6 C) BP: 122/67 Pulse Rate: 61 SpO2: 99 %   Anthropometric Measurements Height: 5\' 9"  (1.753 m) Weight: (!) 309 lb (140.2 kg) BMI (Calculated): 45.61 Weight at Last Visit: 310 lb Weight Lost Since Last Visit: 1 lb Weight Gained Since Last Visit: 0 lb Starting Weight: 363 lb   Body Composition  Body Fat %: 50.9 % Fat Mass (lbs): 157.2 lbs Muscle Mass (lbs): 144.2 lbs Total Body Water (lbs): 113.2 lbs Visceral Fat Rating : 15   Other Clinical Data Fasting: No Labs: No Today's Visit #: 4 Starting Date: 11/02/17     HPI  Chief Complaint: OBESITY  [place] is here to discuss her progress with her obesity treatment plan. She is on the keeping a food journal and adhering to recommended goals of 2000 calories and 90+ protein and states she is following her eating plan approximately 80-90 % of the time. She states she is exercising/cardio 30 minutes 3 times per week.   Interval History:  Since last office visit she is down 1 lb  Hunger/appetite-Noted increased hunger when keeping calories at 1700 daily . Off metformin (and most medications) as breast feeding and plans to continue until son is 1 yr.  Feels satiated/ better when getting ~ 2000 calories and 130+ grams of protein daily.  Cravings- not excessive Stress- manageable Exercise-30 mins 3 times weekly Hydration-adequate ~100 oz daily  We discussed repeating indirect calorimetry at next visit in order to better determine RMR and caloric needs now that she is not pregnant, but is breast feeding and anticipates will continue until son is 1 yr old. Asked to come in 30 minutes early for the appointment.   Pharmacotherapy: off metformin for ~1 year   TREATMENT PLAN FOR OBESITY:  Recommended Dietary Goals  Nya is currently in the action stage of change. As such, her goal is to continue weight management  plan. She has agreed to keeping a food journal and adhering to recommended goals of 2000 calories and 130+ grams of  protein.  Behavioral Intervention  We discussed the following Behavioral Modification Strategies today: increasing lean protein intake, decreasing simple carbohydrates , increasing vegetables, increasing lower glycemic fruits, increasing fiber rich foods, increasing water intake, work on tracking and journaling calories using tracking application, continue to practice mindfulness when eating, and planning for success.  Additional resources provided today: 100/200 calorie protein snack hand outs  Recommended Physical Activity Goals  Jailynn has been advised to work up to 150 minutes of moderate intensity aerobic activity a week and strengthening exercises 2-3 times per week for cardiovascular health, weight loss maintenance and preservation of muscle mass.   She has agreed to Continue current level of physical activity    Pharmacotherapy We discussed various medication options to help Ruble with her weight loss efforts and we both agreed to continue to work on nutritional and behavioral strategies to promote weight loss.    Return in about 4 weeks (around 12/07/2022).[name] She was informed of the importance of frequent follow up visits to maximize her success with intensive lifestyle modifications for her multiple health conditions.  PHYSICAL EXAM:  Blood pressure 122/67, pulse 61, temperature 97.9 F (36.6 C), height 5\' 9"  (1.753 m), weight (!) 309 lb (140.2 kg), SpO2 99 %, unknown if currently breastfeeding. Body mass index is 45.63 kg/m.  General: She is overweight, cooperative, alert, well developed, and in no acute  distress. PSYCH: Has normal mood, affect and thought process.   Cardiovascular: HR 60's regular Lungs: Normal breathing effort, no conversational dyspnea. Neuro: no focal deficits  DIAGNOSTIC DATA REVIEWED:  BMET    Component Value Date/Time   NA 141  10/12/2022 0825   K 4.6 10/12/2022 0825   CL 104 10/12/2022 0825   CO2 22 10/12/2022 0825   GLUCOSE 87 10/12/2022 0825   GLUCOSE 77 05/10/2022 1329   BUN 19 10/12/2022 0825   CREATININE 0.65 10/12/2022 0825   CREATININE 0.63 05/10/2022 1329   CALCIUM 9.6 10/12/2022 0825   GFRNONAA 116 07/15/2020 1448   GFRAA 134 07/15/2020 1448   Lab Results  Component Value Date   HGBA1C 5.4 10/12/2022   HGBA1C 5.7 09/10/2014   Lab Results  Component Value Date   INSULIN 8.4 10/12/2022   INSULIN 35.6 (H) 11/02/2017   Lab Results  Component Value Date   TSH 1.660 10/12/2022   CBC    Component Value Date/Time   WBC 7.4 05/10/2022 1329   RBC 4.32 05/10/2022 1329   HGB 12.5 05/10/2022 1329   HGB 11.8 11/02/2017 0904   HCT 37.1 05/10/2022 1329   HCT 36.7 11/02/2017 0904   PLT 248 05/10/2022 1329   MCV 85.9 05/10/2022 1329   MCV 84 11/02/2017 0904   MCH 28.9 05/10/2022 1329   MCHC 33.7 05/10/2022 1329   RDW 13.9 05/10/2022 1329   RDW 15.1 11/02/2017 0904   Iron Studies    Component Value Date/Time   IRON 30 11/02/2017 0904   TIBC 337 11/02/2017 0904   FERRITIN 28 11/02/2017 0904   IRONPCTSAT 9 (LL) 11/02/2017 0904   Lipid Panel     Component Value Date/Time   CHOL 192 10/12/2022 0825   TRIG 50 10/12/2022 0825   HDL 60 10/12/2022 0825   CHOLHDL 3.0 05/10/2022 1329   VLDL 20.6 05/25/2017 0843   LDLCALC 123 (H) 10/12/2022 0825   LDLCALC 110 (H) 05/10/2022 1329   Hepatic Function Panel     Component Value Date/Time   PROT 6.5 10/12/2022 0825   ALBUMIN 4.2 10/12/2022 0825   AST 13 10/12/2022 0825   ALT 16 10/12/2022 0825   ALKPHOS 74 10/12/2022 0825   BILITOT 0.3 10/12/2022 0825      Component Value Date/Time   TSH 1.660 10/12/2022 0825   Nutritional Lab Results  Component Value Date   VD25OH 57.3 10/12/2022   VD25OH 58 05/10/2022   VD25OH 53.7 10/27/2021    ASSOCIATED CONDITIONS ADDRESSED TODAY  ASSESSMENT AND PLAN  Problem List Items Addressed This  Visit     Vitamin D deficiency   Prediabetes - Primary   Other hyperlipidemia, pure   Breast feeding status of mother   BMI 45.0-49.9, adult (HCC)   Obesity, Beginning BMI 55.19   Elevated alanine aminotransferase (ALT) level   Prediabetes Labs were reviewed today and discussed with the patient.  Last A1c was 5.4- Stable/ Insulin 8.4 - improved, not at goal.   Medication(s): None Polyphagia:No She is working on nutrition plan to decrease simple carbohydrates, increase lean proteins and exercise to promote weight loss, improve glycemic control and prevent progression to Type 2 diabetes.   Lab Results  Component Value Date   HGBA1C 5.4 10/12/2022   HGBA1C 5.4 05/10/2022   HGBA1C 5.3 03/17/2021   HGBA1C 4.9 10/27/2020   HGBA1C 5.2 07/15/2020   Lab Results  Component Value Date   INSULIN 8.4 10/12/2022   INSULIN 11.1 03/17/2021   INSULIN 5.3  07/15/2020   INSULIN 12.6 01/15/2020   INSULIN 14.6 04/23/2019    Plan: Continue working on nutrition plan to decrease simple carbohydrates, increase lean proteins and exercise to promote weight loss, improve glycemic control and prevent progression to Type 2 diabetes.   Vitamin D Deficiency Labs were reviewed today and discussed with the patient.   Vitamin D is at goal of 50.  Most recent vitamin D level was 57.3. She is on OTC vitamin D3 2000 IU daily. Lab Results  Component Value Date   VD25OH 57.3 10/12/2022   VD25OH 58 05/10/2022   VD25OH 53.7 10/27/2021    Plan: Continue OTC vitamin D3 2000 IU daily  Hyperlipidemia Labs were reviewed today and discussed with the patient.   LDL is not at goal But HDL is very good at 60 and triglycerides are very good at 50.  Medication(s): None Cardiovascular risk factors: dyslipidemia and obesity (BMI >= 30 kg/m2)  Lab Results  Component Value Date   CHOL 192 10/12/2022   HDL 60 10/12/2022   LDLCALC 123 (H) 10/12/2022   TRIG 50 10/12/2022   CHOLHDL 3.0 05/10/2022   CHOLHDL 3.6  03/17/2021   CHOLHDL 3.7 01/15/2020   Lab Results  Component Value Date   ALT 16 10/12/2022   AST 13 10/12/2022   ALKPHOS 74 10/12/2022   BILITOT 0.3 10/12/2022   The ASCVD Risk score (Arnett DK, et al., 2019) failed to calculate for the following reasons:   The 2019 ASCVD risk score is only valid for ages 42 to 87  Plan: Continue to work on Engineer, technical sales -decreasing simple carbohydrates, increasing lean proteins, decreasing saturated fats and cholesterol , avoiding trans fats and exercise as able to promote weight loss, improve lipids and decrease cardiovascular risks.   Breast feeding status of mother: She has had more hunger over the past month on 1700 calories and does better if closer to 2000 calories and at least 130 grams of protein. She notes hunger is down with the increased protein and we discussed and provided protein snack hand outs and black bean hummus recipe for snack as well. She has noticed a decrease in breast milk production over the past month as well. [name] is eating some solid food which may account for some of the decrease but she is hoping to breast feed at least until he is 35 yr old.)  Plan: Will plan to recheck IC at next visit to better determine what her calorie needs are at this time.   Elevated ALT;  Labs were reviewed today and discussed with the patient.  ALT is improved, now in normal range. She has been off estrogen while breast feeding and wonders if this improved liver enzyme as well.  Plan: Continue to work on nutrition plan to promote weight loss. Follow LFT every 4-6 months.    ATTESTASTION STATEMENTS:  Reviewed by clinician on day of visit: allergies, medications, problem list, medical history, surgical history, family history, social history, and previous encounter notes.   I have personally spent  47 minutes total time today in preparation, patient care, nutritional counseling and documentation for this visit, including the following:  review of clinical lab tests; review of medical tests/procedures/services.      Ruthanna Macchia, PA-C

## 2022-12-07 ENCOUNTER — Encounter (INDEPENDENT_AMBULATORY_CARE_PROVIDER_SITE_OTHER): Payer: Self-pay | Admitting: Family Medicine

## 2022-12-07 ENCOUNTER — Ambulatory Visit (INDEPENDENT_AMBULATORY_CARE_PROVIDER_SITE_OTHER): Payer: 59 | Admitting: Family Medicine

## 2022-12-07 ENCOUNTER — Other Ambulatory Visit (HOSPITAL_COMMUNITY): Payer: Self-pay

## 2022-12-07 ENCOUNTER — Other Ambulatory Visit: Payer: Self-pay

## 2022-12-07 VITALS — BP 111/67 | HR 68 | Temp 98.1°F | Ht 69.0 in | Wt 309.0 lb

## 2022-12-07 DIAGNOSIS — Z6841 Body Mass Index (BMI) 40.0 and over, adult: Secondary | ICD-10-CM | POA: Diagnosis not present

## 2022-12-07 DIAGNOSIS — R7303 Prediabetes: Secondary | ICD-10-CM | POA: Diagnosis not present

## 2022-12-07 DIAGNOSIS — R0602 Shortness of breath: Secondary | ICD-10-CM

## 2022-12-07 DIAGNOSIS — E669 Obesity, unspecified: Secondary | ICD-10-CM

## 2022-12-07 MED ORDER — METFORMIN HCL 500 MG PO TABS
500.0000 mg | ORAL_TABLET | Freq: Every day | ORAL | 0 refills | Status: DC
  Filled 2022-12-07 – 2022-12-24 (×2): qty 90, 90d supply, fill #0

## 2022-12-07 NOTE — Progress Notes (Unsigned)
Chief Complaint:   OBESITY [name] is here to discuss her progress with her obesity treatment plan along with follow-up of her obesity related diagnoses. [name] is on keeping a food journal and adhering to recommended goals of 2000 calories and 130+ grams of protein and states she is following her eating plan approximately 80% of the time. [name] states she is doing cardio for 30 minutes 1-2 times per week.  Today's visit was #: 80 Starting weight: 363 lbs Starting date: 11/02/2017 Today's weight: 309 lbs Today's date: 12/07/2022 Total lbs lost to date: 54 Total lbs lost since last in-office visit: 0  Interim History: Patient has done well with maintaining her weight.  She is still breast-feeding but her son is 31 months old and is starting to eat solid food.  She is working on Orthoptist but she has significant polyphagia at 1700 kcal per day.  She is meeting and exceeding her protein goals.  Subjective:   1. SOBOE (shortness of breath on exertion) Patient's symptoms are unchanged and she is due to have her IC rechecked.  2. Prediabetes Patient notes increased polyphagia despite eating healthier.  She has been breast-feeding for the last 8 months.  She has been on metformin previously although her labs are very good with her lifestyle changes.  Assessment/Plan:   1. SOBOE (shortness of breath on exertion) IC was repeated today and has decreased which is unexpected with her breast-feeding and increased protein intake.  Patient will work on increasing strengthening exercise to help improve muscle mass.  2. Prediabetes Patient agreed to start metformin 500 mg every morning with food, with a 90-day supply.  Patient is to contact her pediatrician to see if they are okay with her taking metformin while breast-feeding, and patient agreed to do so.  - metFORMIN (GLUCOPHAGE) 500 MG tablet; Take 1 tablet (500 mg total) by mouth daily with breakfast.  Dispense: 90 tablet; Refill: 0  3.  BMI 45.0-49.9, adult (HCC) Current BMI 45.6  4. Obesity, Beginning BMI 55.19 [name] is currently in the action stage of change. As such, her goal is to maintain weight for now. She has agreed to keeping a food journal and adhering to recommended goals of 1700-2000 calories and 130+ grams of protein daily.   Excess hunger may be related to decreased breast-feeding.  Patient was encouraged to increase her kcal to 2000/day if needed, and plan to maintain her weight until breast-feeding is finished.  Exercise goals: As is, add strengthening.   Behavioral modification strategies: increasing lean protein intake.  [name] has agreed to follow-up with our clinic in 8 weeks. She was informed of the importance of frequent follow-up visits to maximize her success with intensive lifestyle modifications for her multiple health conditions.   Objective:   Blood pressure 111/67, pulse 68, temperature 98.1 F (36.7 C), height 5\' 9"  (1.753 m), weight (!) 309 lb (140.2 kg), SpO2 98 %, unknown if currently breastfeeding. Body mass index is 45.63 kg/m.  Lab Results  Component Value Date   CREATININE 0.65 10/12/2022   BUN 19 10/12/2022   NA 141 10/12/2022   K 4.6 10/12/2022   CL 104 10/12/2022   CO2 22 10/12/2022   Lab Results  Component Value Date   ALT 16 10/12/2022   AST 13 10/12/2022   ALKPHOS 74 10/12/2022   BILITOT 0.3 10/12/2022   Lab Results  Component Value Date   HGBA1C 5.4 10/12/2022   HGBA1C 5.4 05/10/2022   HGBA1C 5.3 03/17/2021  HGBA1C 4.9 10/27/2020   HGBA1C 5.2 07/15/2020   Lab Results  Component Value Date   INSULIN 8.4 10/12/2022   INSULIN 11.1 03/17/2021   INSULIN 5.3 07/15/2020   INSULIN 12.6 01/15/2020   INSULIN 14.6 04/23/2019   Lab Results  Component Value Date   TSH 1.660 10/12/2022   Lab Results  Component Value Date   CHOL 192 10/12/2022   HDL 60 10/12/2022   LDLCALC 123 (H) 10/12/2022   TRIG 50 10/12/2022   CHOLHDL 3.0 05/10/2022   Lab Results   Component Value Date   VD25OH 57.3 10/12/2022   VD25OH 58 05/10/2022   VD25OH 53.7 10/27/2021   Lab Results  Component Value Date   WBC 7.4 05/10/2022   HGB 12.5 05/10/2022   HCT 37.1 05/10/2022   MCV 85.9 05/10/2022   PLT 248 05/10/2022   Lab Results  Component Value Date   IRON 30 11/02/2017   TIBC 337 11/02/2017   FERRITIN 28 11/02/2017   Attestation Statements:   Reviewed by clinician on day of visit: allergies, medications, problem list, medical history, surgical history, family history, social history, and previous encounter notes.   I, Burt Knack, am acting as transcriptionist for Quillian Quince, MD.  I have reviewed the above documentation for accuracy and completeness, and I agree with the above. -  Quillian Quince, MD

## 2022-12-08 ENCOUNTER — Other Ambulatory Visit: Payer: Self-pay

## 2022-12-08 ENCOUNTER — Encounter: Payer: Self-pay | Admitting: Pharmacist

## 2022-12-09 ENCOUNTER — Other Ambulatory Visit (HOSPITAL_COMMUNITY): Payer: Self-pay

## 2022-12-13 ENCOUNTER — Other Ambulatory Visit: Payer: Self-pay

## 2022-12-24 ENCOUNTER — Other Ambulatory Visit (HOSPITAL_COMMUNITY): Payer: Self-pay

## 2022-12-24 ENCOUNTER — Other Ambulatory Visit: Payer: Self-pay

## 2023-01-04 ENCOUNTER — Ambulatory Visit (INDEPENDENT_AMBULATORY_CARE_PROVIDER_SITE_OTHER): Payer: 59 | Admitting: Family Medicine

## 2023-02-01 ENCOUNTER — Ambulatory Visit (INDEPENDENT_AMBULATORY_CARE_PROVIDER_SITE_OTHER): Payer: 59 | Admitting: Family Medicine

## 2023-03-01 ENCOUNTER — Other Ambulatory Visit (HOSPITAL_COMMUNITY): Payer: Self-pay

## 2023-03-01 ENCOUNTER — Ambulatory Visit (INDEPENDENT_AMBULATORY_CARE_PROVIDER_SITE_OTHER): Payer: 59 | Admitting: Family Medicine

## 2023-03-01 ENCOUNTER — Encounter (INDEPENDENT_AMBULATORY_CARE_PROVIDER_SITE_OTHER): Payer: Self-pay | Admitting: Family Medicine

## 2023-03-01 VITALS — BP 129/80 | HR 62 | Temp 97.7°F | Ht 69.0 in | Wt 304.0 lb

## 2023-03-01 DIAGNOSIS — R7303 Prediabetes: Secondary | ICD-10-CM

## 2023-03-01 DIAGNOSIS — Z6841 Body Mass Index (BMI) 40.0 and over, adult: Secondary | ICD-10-CM

## 2023-03-01 DIAGNOSIS — E559 Vitamin D deficiency, unspecified: Secondary | ICD-10-CM

## 2023-03-01 DIAGNOSIS — E785 Hyperlipidemia, unspecified: Secondary | ICD-10-CM | POA: Diagnosis not present

## 2023-03-01 DIAGNOSIS — E669 Obesity, unspecified: Secondary | ICD-10-CM

## 2023-03-01 DIAGNOSIS — E7849 Other hyperlipidemia: Secondary | ICD-10-CM

## 2023-03-01 MED ORDER — METFORMIN HCL 500 MG PO TABS
500.0000 mg | ORAL_TABLET | Freq: Every day | ORAL | 0 refills | Status: DC
  Filled 2023-03-01 – 2023-04-17 (×2): qty 90, 90d supply, fill #0

## 2023-03-01 NOTE — Progress Notes (Signed)
.smr  Office: [phone]  /  Fax: [phone]  WEIGHT SUMMARY AND BIOMETRICS  Anthropometric Measurements Height: 5\' 9"  (1.753 m) Weight: (!) 304 lb (137.9 kg) BMI (Calculated): 44.87 Weight at Last Visit: 309 lb Weight Lost Since Last Visit: 5 lb Weight Gained Since Last Visit: 0 Starting Weight: 363 lb Total Weight Loss (lbs): 59 lb (26.8 kg)   Body Composition  Body Fat %: 51.1 % Fat Mass (lbs): 155.8 lbs Muscle Mass (lbs): 141.6 lbs Total Body Water (lbs): 110.2 lbs Visceral Fat Rating : 15   Other Clinical Data Fasting: No Labs: No Today's Visit #: 18 Starting Date: 11/02/17    Chief Complaint: OBESITY   Discussed the use of AI scribe software for clinical note transcription with the patient, who gave verbal consent to proceed.  History of Present Illness   The patient is a 37 year old individual with a history of obesity, prediabetes, and hyperlipidemia, who presents for a follow-up visit to discuss weight loss progress. Over the past two and a half months, she has lost 5 pounds and has been maintaining a food journal with a daily goal of 1700 to 2000 calories and at least 120 grams of protein. She reports achieving this goal approximately 50% of the time. She has also been engaging in cardio and strength training exercises for thirty minutes, two times per week.  The patient restarted metformin but has not noticed a significant difference in her condition. She did not experience any significant gastrointestinal upset as she did previously. She has been experiencing inconsistent sleep patterns due to her child's teething, which has affected her energy levels and exercise routine.  The patient is breastfeeding but notes that this is starting to slow down. She expresses a desire to focus more on her weight loss and is considering going back on the category three plan. She is also considering increasing her exercise routine to boost her metabolism. She has a history  of premature ovulation failure and has been on estrogen replacement therapy prior to pregnancy. She is considering going back on estrogen if she notices a hormonal imbalance after ending breastfeeding.  The patient is also managing hyperlipidemia with diet and exercise and is not currently on a statin. She has been successful in maintaining her vitamin D levels with supplementation and hormone replacement therapy. She is due for a follow-up lab test to monitor her A1C, cholesterol, and vitamin D levels.          PHYSICAL EXAM:  Blood pressure 129/80, pulse 62, temperature 97.7 F (36.5 C), height 5\' 9"  (1.753 m), weight (!) 304 lb (137.9 kg), SpO2 98%, unknown if currently breastfeeding. Body mass index is 44.89 kg/m.  DIAGNOSTIC DATA REVIEWED:  BMET    Component Value Date/Time   NA 141 10/12/2022 0825   K 4.6 10/12/2022 0825   CL 104 10/12/2022 0825   CO2 22 10/12/2022 0825   GLUCOSE 87 10/12/2022 0825   GLUCOSE 77 05/10/2022 1329   BUN 19 10/12/2022 0825   CREATININE 0.65 10/12/2022 0825   CREATININE 0.63 05/10/2022 1329   CALCIUM 9.6 10/12/2022 0825   GFRNONAA 116 07/15/2020 1448   GFRAA 134 07/15/2020 1448   Lab Results  Component Value Date   HGBA1C 5.4 10/12/2022   HGBA1C 5.7 09/10/2014   Lab Results  Component Value Date   INSULIN 8.4 10/12/2022   INSULIN 35.6 (H) 11/02/2017   Lab Results  Component Value Date   TSH 1.660 10/12/2022   CBC    Component  Value Date/Time   WBC 7.4 05/10/2022 1329   RBC 4.32 05/10/2022 1329   HGB 12.5 05/10/2022 1329   HGB 11.8 11/02/2017 0904   HCT 37.1 05/10/2022 1329   HCT 36.7 11/02/2017 0904   PLT 248 05/10/2022 1329   MCV 85.9 05/10/2022 1329   MCV 84 11/02/2017 0904   MCH 28.9 05/10/2022 1329   MCHC 33.7 05/10/2022 1329   RDW 13.9 05/10/2022 1329   RDW 15.1 11/02/2017 0904   Iron Studies    Component Value Date/Time   IRON 30 11/02/2017 0904   TIBC 337 11/02/2017 0904   FERRITIN 28 11/02/2017 0904    IRONPCTSAT 9 (LL) 11/02/2017 0904   Lipid Panel     Component Value Date/Time   CHOL 192 10/12/2022 0825   TRIG 50 10/12/2022 0825   HDL 60 10/12/2022 0825   CHOLHDL 3.0 05/10/2022 1329   VLDL 20.6 05/25/2017 0843   LDLCALC 123 (H) 10/12/2022 0825   LDLCALC 110 (H) 05/10/2022 1329   Hepatic Function Panel     Component Value Date/Time   PROT 6.5 10/12/2022 0825   ALBUMIN 4.2 10/12/2022 0825   AST 13 10/12/2022 0825   ALT 16 10/12/2022 0825   ALKPHOS 74 10/12/2022 0825   BILITOT 0.3 10/12/2022 0825      Component Value Date/Time   TSH 1.660 10/12/2022 0825   Nutritional Lab Results  Component Value Date   VD25OH 57.3 10/12/2022   VD25OH 58 05/10/2022   VD25OH 53.7 10/27/2021     Assessment and Plan    Obesity 5-pound weight loss over the past 2.5 months. Adherence to diet and exercise regimen is inconsistent. Patient is motivated to increase metabolism and lose weight. -Continue current diet and exercise regimen, encourage to exercise both cardio and strengthing 150 minutes per week. -Consider Category 3 plan to balance weight loss and metabolism. -Encourage strength training to point of exhaustion for metabolism benefits.  Prediabetes Patient restarted Metformin without significant GI upset. Last A1c was 5.4 in April. -Continue Metformin. -Consider increasing Metformin dose if tolerated. -Check A1c, fasting labs, in 3 month.  Hyperlipidemia Managed with diet and exercise. Not currently on a statin. -Continue diet and exercise. -Check cholesterol levels with other labs in 3 months.   Vit D deficiency pt levels improved when she was on estrogen -continue vit D OTC 2000 IU each day and check labs    Follow-up Next appointment in three months. Patient to complete labs one week prior to visit.        She was informed of the importance of frequent follow up visits to maximize her success with intensive lifestyle modifications for her multiple health  conditions.    Quillian Quince, MD

## 2023-03-02 ENCOUNTER — Other Ambulatory Visit (HOSPITAL_COMMUNITY): Payer: Self-pay

## 2023-04-18 ENCOUNTER — Other Ambulatory Visit (HOSPITAL_COMMUNITY): Payer: Self-pay

## 2023-04-18 ENCOUNTER — Encounter: Payer: Self-pay | Admitting: Family Medicine

## 2023-04-18 ENCOUNTER — Other Ambulatory Visit: Payer: Self-pay

## 2023-04-19 ENCOUNTER — Other Ambulatory Visit: Payer: Self-pay

## 2023-04-19 ENCOUNTER — Other Ambulatory Visit (HOSPITAL_COMMUNITY): Payer: Self-pay

## 2023-04-19 MED ORDER — MEDROXYPROGESTERONE ACETATE 5 MG PO TABS
5.0000 mg | ORAL_TABLET | Freq: Every day | ORAL | 11 refills | Status: DC
  Filled 2023-04-19: qty 12, 12d supply, fill #0
  Filled 2023-07-12: qty 12, 12d supply, fill #1
  Filled 2023-08-10: qty 12, 12d supply, fill #2
  Filled 2023-09-07: qty 12, 12d supply, fill #3

## 2023-04-19 MED ORDER — ESTROGENS CONJUGATED 0.625 MG PO TABS
0.6250 mg | ORAL_TABLET | Freq: Every day | ORAL | 11 refills | Status: DC
  Filled 2023-04-19: qty 30, 30d supply, fill #0
  Filled 2023-05-15: qty 30, 30d supply, fill #1
  Filled 2023-06-14: qty 30, 30d supply, fill #2
  Filled 2023-07-12: qty 30, 30d supply, fill #3
  Filled 2023-08-10: qty 30, 30d supply, fill #4
  Filled 2023-09-07: qty 30, 30d supply, fill #5

## 2023-05-06 DIAGNOSIS — M1712 Unilateral primary osteoarthritis, left knee: Secondary | ICD-10-CM | POA: Diagnosis not present

## 2023-05-16 ENCOUNTER — Other Ambulatory Visit: Payer: Self-pay

## 2023-05-16 ENCOUNTER — Encounter: Payer: Self-pay | Admitting: Family Medicine

## 2023-05-23 NOTE — Progress Notes (Unsigned)
SUBJECTIVE:  Chief Complaint: Obesity Discussed the use of AI scribe software for clinical note transcription with the patient, who gave verbal consent to proceed.  History of Present Illness     Interim History: She is up 9 lbs since her last visit.  Down 50 lbs overall TBW loss of 13.8% [name], a 37 year old individual with a history of obesity, prediabetes, vitamin D deficiency, hyperlipidemia, and polycystic ovary syndrome (PCOS), presents for a follow-up visit regarding her obesity treatment plan. She reports a recent weight gain, which she attributes to a lapse in adherence to her diet and exercise regimen. She recently stopped breastfeeding her infant, [name],  and resumed estrogen therapy, which she believes has improved her satiety after meals. Previously, she experienced persistent hunger and could eat continuously without feeling full.  She acknowledges the need to resume journaling her food intake, estimating her current daily caloric intake to be between 1800 and 2000 calories. She expresses a desire to recheck her metabolism in the coming months. She also mentions the financial burden of her high-deductible health plan, which charges $270 per visit, but views this as an additional source of accountability.  She plans to return to regular exercise, aiming for a mix of cardio and strength training. She notes a previous significant drop in her metabolism, which she attributes to strict dieting and pregnancy. She hopes to rebuild her metabolism with strength training. She also mentions a previous left knee injury, which has been aggravated by squats. She plans to consult with a physical therapist for alternative leg strengthening exercises to avoid re-injury.   She expresses a desire to lose weight in preparation for a potential future pregnancy. She is currently taking metformin, over-the-counter vitamin D, and a prenatal vitamin. She also mentions a history of vitamin B12  deficiency, which has been well-controlled since starting estrogen therapy.  [name] is here to discuss her progress with her obesity treatment plan. She is on the Category 3 Plan and keeping a food journal and adhering to recommended goals of 1500-1600 calories and 100 grams of  protein and states she is following her eating plan approximately 50 % of the time. She states she is exercising Cardio 30 minutes 2 times per week.  Fasting labs were obtained today.  She was informed we would discuss her lab results at her next visit unless there is a critical issue that needs to be addressed sooner. She agreed to keep her next visit at the agreed upon time to discuss these results.    OBJECTIVE: Visit Diagnoses: Problem List Items Addressed This Visit     PCOS (polycystic ovarian syndrome)   Vitamin D deficiency   Prediabetes - Primary   Relevant Medications   metFORMIN (GLUCOPHAGE) 500 MG tablet   Other hyperlipidemia, pure   BMI 45.0-49.9, adult (HCC)   Relevant Medications   metFORMIN (GLUCOPHAGE) 500 MG tablet   Obesity, Beginning BMI 55.19   Relevant Medications   metFORMIN (GLUCOPHAGE) 500 MG tablet  Obesity Not logging food intake recently, estimates caloric intake around 1800-2000 calories/day. Resumed estrogen therapy post-breastfeeding, improved satiety. Plans to log food intake more consistently, recheck metabolism at the beginning of the year. Plans to incorporate more strength training to improve metabolism. Discussed strategies for portion control, protein intake, mindful eating, hydration, and simple, consistent health habits. - Encourage consistent food logging - Recheck metabolism at the beginning of the year - Incorporate strength training into exercise routine - Follow up with physical therapist for knee injury and  exercise tips to avoid re-injury - Encourage protein intake and portion control - Encourage hydration and regular physical activity - Consider using a  weighted vest for walks  Polycystic Ovary Syndrome (PCOS) Resumed estrogen therapy, improved symptoms of satiety. Discussed the benefits of estrogen therapy in managing PCOS symptoms. - Continue estrogen therapy and progesterone per OB GYN  Prediabetes Lab Results  Component Value Date   HGBA1C 5.4 10/12/2022   HGBA1C 5.4 05/10/2022   HGBA1C 5.3 03/17/2021   Lab Results  Component Value Date   LDLCALC 123 (H) 10/12/2022   CREATININE 0.65 10/12/2022  Insulin 8.4 10/12/22- not at goal.   Currently on metformin 500 mg daily. Discussed the importance of monitoring blood glucose levels and maintaining a balanced diet. - Continue/refill metformin 500 mg daily.  Continue working on nutrition plan to decrease simple carbohydrates, increase lean proteins and exercise to promote weight loss, improve glycemic control and prevent progression to Type 2 diabetes.  - Order HbA1c and insulin level and CMET  Hyperlipidemia LDL is not at goal. Medication(s): None Cardiovascular risk factors: dyslipidemia, family history of premature cardiovascular disease, obesity (BMI >= 30 kg/m2), and sedentary lifestyle  Lab Results  Component Value Date   CHOL 192 10/12/2022   HDL 60 10/12/2022   LDLCALC 123 (H) 10/12/2022   TRIG 50 10/12/2022   CHOLHDL 3.0 05/10/2022   CHOLHDL 3.6 03/17/2021   CHOLHDL 3.7 01/15/2020   Lab Results  Component Value Date   ALT 16 10/12/2022   AST 13 10/12/2022   ALKPHOS 74 10/12/2022   BILITOT 0.3 10/12/2022   The ASCVD Risk score (Arnett DK, et al., 2019) failed to calculate for the following reasons:   The 2019 ASCVD risk score is only valid for ages 49 to 33  Plan: Continue to work on Engineer, technical sales -decreasing simple carbohydrates, increasing lean proteins, decreasing saturated fats and cholesterol , avoiding trans fats and exercise as able to promote weight loss, improve lipids and decrease cardiovascular risks. Due for lipid panel testing. Discussed the  importance of maintaining a healthy diet and regular exercise to manage lipid levels. - Order lipid panel  Vitamin D Deficiency Currently taking 2000 units of over-the-counter vitamin D daily. Discussed the importance of maintaining adequate vitamin D levels for bone health. - Continue 2000 units of vitamin D daily - Order vitamin D level  Follow-up - Follow up on December 14th at 8:00 AM - Follow up on February 11th at 8:20 AM - Order CBC, CMP, HbA1c, insulin, lipid panel, vitamin D, and B12 levels.  Vitals Temp: 98.5 F (36.9 C) BP: 126/84 Pulse Rate: 60 SpO2: 100 %   Anthropometric Measurements Height: 5\' 9"  (1.753 m) Weight: (!) 313 lb (142 kg) BMI (Calculated): 46.2 Weight at Last Visit: 304 lb Weight Lost Since Last Visit: 0 Weight Gained Since Last Visit: 9 lb Starting Weight: 363 lb Total Weight Loss (lbs): 50 lb (22.7 kg) Peak Weight: 375 lb   Body Composition  Body Fat %: 53 % Fat Mass (lbs): 166.4 lbs Muscle Mass (lbs): 140 lbs Total Body Water (lbs): 114.8 lbs Visceral Fat Rating : 16   Other Clinical Data Fasting: yes Labs: yes Today's Visit #: 71 Starting Date: 11/02/17     ASSESSMENT AND PLAN:  Diet: [name] is currently in the action stage of change. As such, her goal is to continue with weight loss efforts. She has agreed to Category 3 Plan.  Exercise: Orlene has been instructed to work up to a goal  of 150 minutes of combined cardio and strengthening exercise per week for weight loss and overall health benefits.   Behavior Modification:  We discussed the following Behavioral Modification Strategies today: increasing lean protein intake, decreasing simple carbohydrates, increasing vegetables, increase H2O intake, increase high fiber foods, holiday eating strategies, planning for success, and keep a strict food journal. We discussed various medication options to help Calyse with her weight loss efforts and we both agreed to continue  metformin for primary indication of prediabetes.  Return in about 6 weeks (around 07/05/2023).[name] Kitchen She was informed of the importance of frequent follow up visits to maximize her success with intensive lifestyle modifications for her multiple health conditions.  Attestation Statements:   Reviewed by clinician on day of visit: allergies, medications, problem list, medical history, surgical history, family history, social history, and previous encounter notes.   Time spent on visit including pre-visit chart review and post-visit care and charting was 35 minutes.    Concha Sudol, PA-C

## 2023-05-24 ENCOUNTER — Encounter (INDEPENDENT_AMBULATORY_CARE_PROVIDER_SITE_OTHER): Payer: Self-pay | Admitting: Physician Assistant

## 2023-05-24 ENCOUNTER — Other Ambulatory Visit (HOSPITAL_COMMUNITY): Payer: Self-pay

## 2023-05-24 ENCOUNTER — Ambulatory Visit (INDEPENDENT_AMBULATORY_CARE_PROVIDER_SITE_OTHER): Payer: 59 | Admitting: Physician Assistant

## 2023-05-24 VITALS — BP 126/84 | HR 60 | Temp 98.5°F | Ht 69.0 in | Wt 313.0 lb

## 2023-05-24 DIAGNOSIS — E282 Polycystic ovarian syndrome: Secondary | ICD-10-CM

## 2023-05-24 DIAGNOSIS — E669 Obesity, unspecified: Secondary | ICD-10-CM | POA: Diagnosis not present

## 2023-05-24 DIAGNOSIS — Z6841 Body Mass Index (BMI) 40.0 and over, adult: Secondary | ICD-10-CM | POA: Diagnosis not present

## 2023-05-24 DIAGNOSIS — R7303 Prediabetes: Secondary | ICD-10-CM | POA: Diagnosis not present

## 2023-05-24 DIAGNOSIS — E559 Vitamin D deficiency, unspecified: Secondary | ICD-10-CM | POA: Diagnosis not present

## 2023-05-24 DIAGNOSIS — E7849 Other hyperlipidemia: Secondary | ICD-10-CM | POA: Diagnosis not present

## 2023-05-24 MED ORDER — METFORMIN HCL 500 MG PO TABS
500.0000 mg | ORAL_TABLET | Freq: Every day | ORAL | 0 refills | Status: DC
  Filled 2023-05-24 – 2023-07-12 (×3): qty 90, 90d supply, fill #0

## 2023-05-25 LAB — CMP14+EGFR
ALT: 13 [IU]/L (ref 0–32)
AST: 11 [IU]/L (ref 0–40)
Albumin: 4 g/dL (ref 3.9–4.9)
Alkaline Phosphatase: 76 [IU]/L (ref 44–121)
BUN/Creatinine Ratio: 20 (ref 9–23)
BUN: 12 mg/dL (ref 6–20)
Bilirubin Total: 0.3 mg/dL (ref 0.0–1.2)
CO2: 23 mmol/L (ref 20–29)
Calcium: 8.9 mg/dL (ref 8.7–10.2)
Chloride: 105 mmol/L (ref 96–106)
Creatinine, Ser: 0.61 mg/dL (ref 0.57–1.00)
Globulin, Total: 2.3 g/dL (ref 1.5–4.5)
Glucose: 74 mg/dL (ref 70–99)
Potassium: 4.3 mmol/L (ref 3.5–5.2)
Sodium: 141 mmol/L (ref 134–144)
Total Protein: 6.3 g/dL (ref 6.0–8.5)
eGFR: 119 mL/min/{1.73_m2} (ref >59)

## 2023-05-25 LAB — LIPID PANEL WITH LDL/HDL RATIO
Cholesterol, Total: 207 mg/dL — ABNORMAL HIGH (ref 100–199)
HDL: 67 mg/dL (ref >39)
LDL Chol Calc (NIH): 120 mg/dL — ABNORMAL HIGH (ref 0–99)
LDL/HDL Ratio: 1.8 {ratio} (ref 0.0–3.2)
Triglycerides: 115 mg/dL (ref 0–149)
VLDL Cholesterol Cal: 20 mg/dL (ref 5–40)

## 2023-05-25 LAB — HEMOGLOBIN A1C
Est. average glucose Bld gHb Est-mCnc: 103 mg/dL
Hgb A1c MFr Bld: 5.2 % (ref 4.8–5.6)

## 2023-05-25 LAB — INSULIN, RANDOM: INSULIN: 6.1 u[IU]/mL (ref 2.6–24.9)

## 2023-05-25 LAB — CBC WITH DIFFERENTIAL/PLATELET
Basophils Absolute: 0 10*3/uL (ref 0.0–0.2)
Basos: 1 %
EOS (ABSOLUTE): 0.3 10*3/uL (ref 0.0–0.4)
Eos: 5 %
Hematocrit: 38.5 % (ref 34.0–46.6)
Hemoglobin: 12.2 g/dL (ref 11.1–15.9)
Immature Grans (Abs): 0 10*3/uL (ref 0.0–0.1)
Immature Granulocytes: 0 %
Lymphocytes Absolute: 1.7 10*3/uL (ref 0.7–3.1)
Lymphs: 28 %
MCH: 28.4 pg (ref 26.6–33.0)
MCHC: 31.7 g/dL (ref 31.5–35.7)
MCV: 90 fL (ref 79–97)
Monocytes Absolute: 0.4 10*3/uL (ref 0.1–0.9)
Monocytes: 7 %
Neutrophils Absolute: 3.6 10*3/uL (ref 1.4–7.0)
Neutrophils: 59 %
Platelets: 267 10*3/uL (ref 150–450)
RBC: 4.3 x10E6/uL (ref 3.77–5.28)
RDW: 12.7 % (ref 11.7–15.4)
WBC: 6.1 10*3/uL (ref 3.4–10.8)

## 2023-05-25 LAB — VITAMIN D 25 HYDROXY (VIT D DEFICIENCY, FRACTURES): Vit D, 25-Hydroxy: 40.9 ng/mL (ref 30.0–100.0)

## 2023-06-01 NOTE — Therapy (Signed)
OUTPATIENT PHYSICAL THERAPY LOWER EXTREMITY EVALUATION   Patient Name: [name] MRN: [id number] DOB:Jul 26, 1985, 37 y.o., female Today's Date: 06/03/2023  END OF SESSION:  PT End of Session - 06/03/23 1214     Visit Number 1    Number of Visits 4    Date for PT Re-Evaluation 08/04/23    Authorization Type [employer] [facility]    PT Start Time 1215    PT Stop Time 1300    PT Time Calculation (min) 45 min    Activity Tolerance Patient tolerated treatment well    Behavior During Therapy [facility] for tasks assessed/performed             Past Medical History:  Diagnosis Date   Anemia    Back pain    BPES syndrome    Enlarged thyroid    Infertility, female    Knee dislocation 2010   Left   Marginal insertion of umbilical cord affecting management of mother in third trimester 03/20/2022   Obesity    Palpitations    PCOS (polycystic ovarian syndrome)    PONV (postoperative nausea and vomiting)    Prediabetes    Premature ovarian failure    Vitamin B 12 deficiency    Vitamin D deficiency    Wears eyeglasses    Past Surgical History:  Procedure Laterality Date   ADENOIDECTOMY     EYE SURGERY Bilateral    NASAL SEPTOPLASTY W/ TURBINOPLASTY Bilateral 03/04/2017   Procedure: NASAL SEPTOPLASTY WITH BILATERAL INFERIOR TURBINATE REDUCTION;  Surgeon: Osborn Coho, MD;  Location: [facility] OR;  Service: ENT;  Laterality: Bilateral;   NASAL SEPTUM SURGERY  03/04/2017   TONSILLECTOMY     TONSILLECTOMY AND ADENOIDECTOMY Bilateral 03/04/2017   Procedure: TONSILLECTOMY;  Surgeon: Osborn Coho, MD;  Location: [facility] OR;  Service: ENT;  Laterality: Bilateral;   Patient Active Problem List   Diagnosis Date Noted   Obesity, Beginning BMI 55.19 08/17/2022   Elevated alanine aminotransferase (ALT) level 08/17/2022   BMI 45.0-49.9, adult (HCC) 07/20/2022   Obesity, Beginning BMI 55.19 07/20/2022   Breast feeding status of mother 04/19/2022   Adjustment disorder with anxiety 06/08/2021    Other allergic rhinitis 04/21/2021   Other hyperlipidemia, pure 03/25/2020   FH: heart disease 02/13/2020   SOBOE (shortness of breath on exertion) 11/02/2017   Class 3 severe obesity with serious comorbidity and body mass index (BMI) of 50.0 to 59.9 in adult (HCC) 10/08/2015   Vitamin D deficiency 10/16/2014   Prediabetes 10/16/2014   B12 deficiency anemia 09/02/2014   PCOS (polycystic ovarian syndrome) 08/01/2014   BPES syndrome 08/01/2014    PCP: Natalia Leatherwood, DO   REFERRING PROVIDER: Cherie Dark, PA  REFERRING DIAG: M17.12 (ICD-10-CM) - Unilateral primary osteoarthritis, left knee  THERAPY DIAG:  Chronic pain of left knee - Plan: PT plan of care cert/re-cert  Muscle weakness (generalized) - Plan: PT plan of care cert/re-cert  Rationale for Evaluation and Treatment: Rehabilitation  ONSET DATE: chronic  SUBJECTIVE:   SUBJECTIVE STATEMENT: History of L knee discomfort since patellar dislocation.  Did well well for several years.  Now post partum with c/o L knee pain.  CSI 2 weeks ago which did offer some relief.  PERTINENT HISTORY: Patient has left knee osteoarthritis. She is only 37 years old she is very young at this time. I talked her about this as well as possible treatment options. I offered her a cortisone injection today to see if this help with the swelling to help increase  her range of motion back. I am also going to get her started in some physical therapy to see if they can give her some exercises that she can start doing at home to start working on strengthening of her quads. I am going to order some gel injections to possibly get started for her to hopefully help increase along cavity of her knee. All questions encouraged and answered for the patient today.  PAIN:  Are you having pain? Yes: NPRS scale: 4/10 Pain location: L knee Pain description: ache Aggravating factors: prolonged positions, squatting Relieving factors: position changes,  CSI  PRECAUTIONS: None  RED FLAGS: None   WEIGHT BEARING RESTRICTIONS: No  FALLS:  Has patient fallen in last 6 months? No  OCCUPATION: NP  PLOF: Independent  PATIENT GOALS: To manage my knee pain  NEXT MD VISIT: TBD  OBJECTIVE:  Note: Objective measures were completed at Evaluation unless otherwise noted.  DIAGNOSTIC FINDINGS: none  PATIENT SURVEYS:  FOTO 69(79 predicted)  MUSCLE LENGTH: Hamstrings: Right 90 deg; Left 90 deg  POSTURE:  pes cavus B, mild varus B  PALPATION: unremarkable  LOWER EXTREMITY ROM: WNL  Active ROM Right eval Left eval  Hip flexion    Hip extension    Hip abduction    Hip adduction    Hip internal rotation    Hip external rotation    Knee flexion    Knee extension    Ankle dorsiflexion    Ankle plantarflexion    Ankle inversion    Ankle eversion     (Blank rows = not tested)  LOWER EXTREMITY MMT:  MMT Right eval Left eval  Hip flexion    Hip extension    Hip abduction 4+ 4-  Hip adduction    Hip internal rotation    Hip external rotation    Knee flexion    Knee extension 4+ 4-  Ankle dorsiflexion    Ankle plantarflexion    Ankle inversion    Ankle eversion     (Blank rows = not tested)  LOWER EXTREMITY SPECIAL TESTS:  Knee special tests: Lateral pull sign: positive  and Patellafemoral grind test: positive   FUNCTIONAL TESTS:  30 seconds chair stand test  GAIT: Distance walked: 80ft x2 Assistive device utilized: None Level of assistance: Complete Independence Comments: unremarkable   TODAY'S TREATMENT:                                                                                                                              DATE: 06/03/23 Eval and HEP    PATIENT EDUCATION:  Education details: Discussed eval findings, rehab rationale and POC and patient is in agreement  Person educated: Patient Education method: Explanation Education comprehension: verbalized understanding and needs further  education  HOME EXERCISE PROGRAM: Access Code: [email or web address] URL: [email or web address] Date: 06/03/2023 Prepared by: Gustavus Bryant  Exercises - Supine Quad Set  - 2 x daily - 5 x weekly - 2 sets -  15 reps - 3s hold - Small Range Straight Leg Raise  - 2 x daily - 5 x weekly - 2 sets - 15 reps - 3s hold - Single Leg Heel Raise  - 2 x daily - 5 x weekly - 2 sets - 15 reps - Clamshell  - 2 x daily - 5 x weekly - 2 sets - 15 reps  ASSESSMENT:  CLINICAL IMPRESSION: Patient is a 37 y.o. female who was seen today for physical therapy evaluation and treatment for chronic L knee pain. Patient presents with decreased VMO/VL contraction times and lateral patellar pull with active knee extension.  Patient demos hyperextension and possible patella alta contributing to patellar instability and excess lateral tracking.  OBJECTIVE IMPAIRMENTS: decreased activity tolerance, decreased knowledge of condition, decreased mobility, difficulty walking, decreased strength, and improper body mechanics.   ACTIVITY LIMITATIONS: sitting, squatting, stairs, and transfers  PERSONAL FACTORS: Age, Fitness, and Time since onset of injury/illness/exacerbation are also affecting patient's functional outcome.   REHAB POTENTIAL: Good  CLINICAL DECISION MAKING: Stable/uncomplicated  EVALUATION COMPLEXITY: Low   GOALS: Goals reviewed with patient? No  SHORT TERM GOALS=LONG TERM GOALS: Target date: 08/04/23  Patient to demonstrate independence in HEP  Baseline: Access Code: [email or web address] Goal status: INITIAL  2.  Patient will acknowledge 2/10 pain at least once during episode of care   Baseline: 4/10 Goal status: INITIAL  3.  Increase L knee strength to 4/5 Baseline:  MMT Right eval Left eval  Hip flexion    Hip extension    Hip abduction 4+ 4-  Hip adduction    Hip internal rotation    Hip external rotation    Knee flexion    Knee extension 4+ 4-   Goal status: INITIAL   PLAN:  PT  FREQUENCY: 1-2x/week  PT DURATION: 6 weeks  PLANNED INTERVENTIONS: 97164- PT Re-evaluation, 97110-Therapeutic exercises, 97530- Therapeutic activity, 97112- Neuromuscular re-education, 97535- Self Care, [id number]- Manual therapy, 97116- Gait training, and Dry Needling  PLAN FOR NEXT SESSION: HEP review and update, manual techniques as appropriate, aerobic tasks, ROM and flexibility activities, strengthening and PREs, TPDN, gait and balance training as needed     Hildred Laser, PT 06/03/2023, 1:08 PM

## 2023-06-03 ENCOUNTER — Ambulatory Visit: Payer: 59 | Attending: Physician Assistant

## 2023-06-03 ENCOUNTER — Other Ambulatory Visit: Payer: Self-pay

## 2023-06-03 DIAGNOSIS — M6281 Muscle weakness (generalized): Secondary | ICD-10-CM | POA: Insufficient documentation

## 2023-06-03 DIAGNOSIS — G8929 Other chronic pain: Secondary | ICD-10-CM | POA: Diagnosis not present

## 2023-06-03 DIAGNOSIS — M25562 Pain in left knee: Secondary | ICD-10-CM | POA: Insufficient documentation

## 2023-06-10 NOTE — Therapy (Unsigned)
OUTPATIENT PHYSICAL THERAPY LOWER EXTREMITY EVALUATION   Patient Name: [name] MRN: [id number] DOB:1986-05-11, 37 y.o., female Today's Date: 06/14/2023  END OF SESSION:  PT End of Session - 06/14/23 1049     Visit Number 2    Number of Visits 4    Date for PT Re-Evaluation 08/04/23    Authorization Type [employer] [facility]    PT Start Time 1045    PT Stop Time 1130    PT Time Calculation (min) 45 min    Activity Tolerance Patient tolerated treatment well    Behavior During Therapy [facility] for tasks assessed/performed              Past Medical History:  Diagnosis Date   Anemia    Back pain    BPES syndrome    Enlarged thyroid    Infertility, female    Knee dislocation 2010   Left   Marginal insertion of umbilical cord affecting management of mother in third trimester 03/20/2022   Obesity    Palpitations    PCOS (polycystic ovarian syndrome)    PONV (postoperative nausea and vomiting)    Prediabetes    Premature ovarian failure    Vitamin B 12 deficiency    Vitamin D deficiency    Wears eyeglasses    Past Surgical History:  Procedure Laterality Date   ADENOIDECTOMY     EYE SURGERY Bilateral    NASAL SEPTOPLASTY W/ TURBINOPLASTY Bilateral 03/04/2017   Procedure: NASAL SEPTOPLASTY WITH BILATERAL INFERIOR TURBINATE REDUCTION;  Surgeon: Osborn Coho, MD;  Location: [facility] OR;  Service: ENT;  Laterality: Bilateral;   NASAL SEPTUM SURGERY  03/04/2017   TONSILLECTOMY     TONSILLECTOMY AND ADENOIDECTOMY Bilateral 03/04/2017   Procedure: TONSILLECTOMY;  Surgeon: Osborn Coho, MD;  Location: [facility] OR;  Service: ENT;  Laterality: Bilateral;   Patient Active Problem List   Diagnosis Date Noted   Obesity, Beginning BMI 55.19 08/17/2022   Elevated alanine aminotransferase (ALT) level 08/17/2022   BMI 45.0-49.9, adult (HCC) 07/20/2022   Obesity, Beginning BMI 55.19 07/20/2022   Breast feeding status of mother 04/19/2022   Adjustment disorder with anxiety 06/08/2021    Other allergic rhinitis 04/21/2021   Other hyperlipidemia, pure 03/25/2020   FH: heart disease 02/13/2020   SOBOE (shortness of breath on exertion) 11/02/2017   Class 3 severe obesity with serious comorbidity and body mass index (BMI) of 50.0 to 59.9 in adult (HCC) 10/08/2015   Vitamin D deficiency 10/16/2014   Prediabetes 10/16/2014   B12 deficiency anemia 09/02/2014   PCOS (polycystic ovarian syndrome) 08/01/2014   BPES syndrome 08/01/2014    PCP: Natalia Leatherwood, DO   REFERRING PROVIDER: Cherie Dark, PA  REFERRING DIAG: M17.12 (ICD-10-CM) - Unilateral primary osteoarthritis, left knee  THERAPY DIAG:  Chronic pain of left knee  Muscle weakness (generalized)  Rationale for Evaluation and Treatment: Rehabilitation  ONSET DATE: chronic  SUBJECTIVE:   SUBJECTIVE STATEMENT: History of L knee discomfort since patellar dislocation.  Did well well for several years.  Now post partum with c/o L knee pain.  CSI 2 weeks ago which did offer some relief.  PERTINENT HISTORY: Patient has left knee osteoarthritis. She is only 37 years old she is very young at this time. I talked her about this as well as possible treatment options. I offered her a cortisone injection today to see if this help with the swelling to help increase her range of motion back. I am also going to get her started  in some physical therapy to see if they can give her some exercises that she can start doing at home to start working on strengthening of her quads. I am going to order some gel injections to possibly get started for her to hopefully help increase along cavity of her knee. All questions encouraged and answered for the patient today.  PAIN:  Are you having pain? Yes: NPRS scale: 2/10 Pain location: L knee Pain description: ache Aggravating factors: prolonged positions, squatting Relieving factors: position changes, CSI  PRECAUTIONS: None  RED FLAGS: None   WEIGHT BEARING RESTRICTIONS: No  FALLS:   Has patient fallen in last 6 months? No  OCCUPATION: NP  PLOF: Independent  PATIENT GOALS: To manage my knee pain  NEXT MD VISIT: TBD  OBJECTIVE:  Note: Objective measures were completed at Evaluation unless otherwise noted.  DIAGNOSTIC FINDINGS: none  PATIENT SURVEYS:  FOTO 69(79 predicted)  MUSCLE LENGTH: Hamstrings: Right 90 deg; Left 90 deg  POSTURE:  pes cavus B, mild varus B  PALPATION: unremarkable  LOWER EXTREMITY ROM: WNL  Active ROM Right eval Left eval  Hip flexion    Hip extension    Hip abduction    Hip adduction    Hip internal rotation    Hip external rotation    Knee flexion    Knee extension    Ankle dorsiflexion    Ankle plantarflexion    Ankle inversion    Ankle eversion     (Blank rows = not tested)  LOWER EXTREMITY MMT:  MMT Right eval Left eval  Hip flexion    Hip extension    Hip abduction 4+ 4-  Hip adduction    Hip internal rotation    Hip external rotation    Knee flexion    Knee extension 4+ 4-  Ankle dorsiflexion    Ankle plantarflexion    Ankle inversion    Ankle eversion     (Blank rows = not tested)  LOWER EXTREMITY SPECIAL TESTS:  Knee special tests: Lateral pull sign: positive  and Patellafemoral grind test: positive   FUNCTIONAL TESTS:  30 seconds chair stand test  GAIT: Distance walked: 55ft x2 Assistive device utilized: None Level of assistance: Complete Independence Comments: unremarkable   TODAY'S TREATMENT:        [facility] Adult PT Treatment:                                                DATE: 06/14/23 Therapeutic Exercise: Nustep L4 8 min QS 3s 15x SLR 2# 15x SAQ 2# 15x Supine hip fallouts GTB 15x B, 15/15 unilaterally Bridge with adduction 15x FAQ with adduction 15x TKE GTB 15x Taping applied to L knee using medial glide technique.  Patient instructed to remove tape with any increased pain, skin irritation or in the event the tape loosens and can not be re-applied. Cautioned to never apply  Leukotape directly over skin without protective cover roll in place.  DATE: 06/03/23 Eval and HEP    PATIENT EDUCATION:  Education details: Discussed eval findings, rehab rationale and POC and patient is in agreement  Person educated: Patient Education method: Explanation Education comprehension: verbalized understanding and needs further education  HOME EXERCISE PROGRAM: Access Code: [email or web address] URL: [email or web address] Date: 06/03/2023 Prepared by: Gustavus Bryant  Exercises - Supine Quad Set  - 2 x daily - 5 x weekly - 2 sets - 15 reps - 3s hold - Small Range Straight Leg Raise  - 2 x daily - 5 x weekly - 2 sets - 15 reps - 3s hold - Single Leg Heel Raise  - 2 x daily - 5 x weekly - 2 sets - 15 reps - Clamshell  - 2 x daily - 5 x weekly - 2 sets - 15 reps  ASSESSMENT:  CLINICAL IMPRESSION:  Good follow through with HEP.  Knee symptoms have been minimal.  Mild soreness in muscles from exercise as expected.  Good VMO activation palpated.  Applied McConnell tape despite absence of symptoms for soft tissue distention over time. Patient is a 37 y.o. female who was seen today for physical therapy evaluation and treatment for chronic L knee pain. Patient presents with decreased VMO/VL contraction times and lateral patellar pull with active knee extension.  Patient demos hyperextension and possible patella alta contributing to patellar instability and excess lateral tracking.  OBJECTIVE IMPAIRMENTS: decreased activity tolerance, decreased knowledge of condition, decreased mobility, difficulty walking, decreased strength, and improper body mechanics.   ACTIVITY LIMITATIONS: sitting, squatting, stairs, and transfers  PERSONAL FACTORS: Age, Fitness, and Time since onset of injury/illness/exacerbation are also affecting patient's functional outcome.   REHAB  POTENTIAL: Good  CLINICAL DECISION MAKING: Stable/uncomplicated  EVALUATION COMPLEXITY: Low   GOALS: Goals reviewed with patient? No  SHORT TERM GOALS=LONG TERM GOALS: Target date: 08/04/23  Patient to demonstrate independence in HEP  Baseline: Access Code: [email or web address] Goal status: Ongoing  2.  Patient will acknowledge 2/10 pain at least once during episode of care   Baseline: 4/10 Goal status: Ongoing  3.  Increase L knee strength to 4/5 Baseline:  MMT Right eval Left eval  Hip flexion    Hip extension    Hip abduction 4+ 4-  Hip adduction    Hip internal rotation    Hip external rotation    Knee flexion    Knee extension 4+ 4-   Goal status: INITIAL   PLAN:  PT FREQUENCY: 1-2x/week  PT DURATION: 6 weeks  PLANNED INTERVENTIONS: 97164- PT Re-evaluation, 97110-Therapeutic exercises, 97530- Therapeutic activity, 97112- Neuromuscular re-education, 97535- Self Care, [id number]- Manual therapy, 97116- Gait training, and Dry Needling  PLAN FOR NEXT SESSION: HEP review and update, manual techniques as appropriate, aerobic tasks, ROM and flexibility activities, strengthening and PREs, TPDN, gait and balance training as needed     Hildred Laser, PT 06/14/2023, 11:36 AM

## 2023-06-14 ENCOUNTER — Ambulatory Visit: Payer: 59

## 2023-06-14 ENCOUNTER — Other Ambulatory Visit: Payer: Self-pay

## 2023-06-14 DIAGNOSIS — M6281 Muscle weakness (generalized): Secondary | ICD-10-CM

## 2023-06-14 DIAGNOSIS — M25562 Pain in left knee: Secondary | ICD-10-CM | POA: Diagnosis not present

## 2023-06-14 DIAGNOSIS — G8929 Other chronic pain: Secondary | ICD-10-CM

## 2023-06-27 NOTE — Therapy (Deleted)
 OUTPATIENT PHYSICAL THERAPY LOWER EXTREMITY EVALUATION   Patient Name: [name] MRN: [id number] DOB:[birth year], 38 y.o., female Today's Date: 06/27/2023  END OF SESSION:     Past Medical History:  Diagnosis Date   Anemia    Back pain    BPES syndrome    Enlarged thyroid     Infertility, female    Knee dislocation 2010   Left   Marginal insertion of umbilical cord affecting management of mother in third trimester 03/20/2022   Obesity    Palpitations    PCOS (polycystic ovarian syndrome)    PONV (postoperative nausea and vomiting)    Prediabetes    Premature ovarian failure    Vitamin B 12 deficiency    Vitamin D  deficiency    Wears eyeglasses    Past Surgical History:  Procedure Laterality Date   ADENOIDECTOMY     EYE SURGERY Bilateral    NASAL SEPTOPLASTY W/ TURBINOPLASTY Bilateral 03/04/2017   Procedure: NASAL SEPTOPLASTY WITH BILATERAL INFERIOR TURBINATE REDUCTION;  Surgeon: Mable Lenis, MD;  Location: [facility] OR;  Service: ENT;  Laterality: Bilateral;   NASAL SEPTUM SURGERY  03/04/2017   TONSILLECTOMY     TONSILLECTOMY AND ADENOIDECTOMY Bilateral 03/04/2017   Procedure: TONSILLECTOMY;  Surgeon: Mable Lenis, MD;  Location: [facility] OR;  Service: ENT;  Laterality: Bilateral;   Patient Active Problem List   Diagnosis Date Noted   Obesity, Beginning BMI 55.19 08/17/2022   Elevated alanine aminotransferase (ALT) level 08/17/2022   BMI 45.0-49.9, adult (HCC) 07/20/2022   Obesity, Beginning BMI 55.19 07/20/2022   Breast feeding status of mother 04/19/2022   Adjustment disorder with anxiety 06/08/2021   Other allergic rhinitis 04/21/2021   Other hyperlipidemia, pure 03/25/2020   FH: heart disease 02/13/2020   SOBOE (shortness of breath on exertion) 11/02/2017   Class 3 severe obesity with serious comorbidity and body mass index (BMI) of 50.0 to 59.9 in adult (HCC) 10/08/2015   Vitamin D  deficiency 10/16/2014   Prediabetes 10/16/2014   B12 deficiency anemia  09/02/2014   PCOS (polycystic ovarian syndrome) 08/01/2014   BPES syndrome 08/01/2014    PCP: Catherine Charlies LABOR, DO   REFERRING PROVIDER: Stephen Olympia SAUNDERS, PA  REFERRING DIAG: M17.12 (ICD-10-CM) - Unilateral primary osteoarthritis, left knee  THERAPY DIAG:  No diagnosis found.  Rationale for Evaluation and Treatment: Rehabilitation  ONSET DATE: chronic  SUBJECTIVE:   SUBJECTIVE STATEMENT: History of L knee discomfort since patellar dislocation.  Did well well for several years.  Now post partum with c/o L knee pain.  CSI 2 weeks ago which did offer some relief.  PERTINENT HISTORY: Patient has left knee osteoarthritis. She is only 38 years old she is very young at this time. I talked her about this as well as possible treatment options. I offered her a cortisone injection today to see if this help with the swelling to help increase her range of motion back. I am also going to get her started in some physical therapy to see if they can give her some exercises that she can start doing at home to start working on strengthening of her quads. I am going to order some gel injections to possibly get started for her to hopefully help increase along cavity of her knee. All questions encouraged and answered for the patient today.  PAIN:  Are you having pain? Yes: NPRS scale: 2/10 Pain location: L knee Pain description: ache Aggravating factors: prolonged positions, squatting Relieving factors: position changes, CSI  PRECAUTIONS: None  RED FLAGS:  None   WEIGHT BEARING RESTRICTIONS: No  FALLS:  Has patient fallen in last 6 months? No  OCCUPATION: NP  PLOF: Independent  PATIENT GOALS: To manage my knee pain  NEXT MD VISIT: TBD  OBJECTIVE:  Note: Objective measures were completed at Evaluation unless otherwise noted.  DIAGNOSTIC FINDINGS: none  PATIENT SURVEYS:  FOTO 69(79 predicted)  MUSCLE LENGTH: Hamstrings: Right 90 deg; Left 90 deg  POSTURE:  pes cavus B, mild varus  B  PALPATION: unremarkable  LOWER EXTREMITY ROM: WNL  Active ROM Right eval Left eval  Hip flexion    Hip extension    Hip abduction    Hip adduction    Hip internal rotation    Hip external rotation    Knee flexion    Knee extension    Ankle dorsiflexion    Ankle plantarflexion    Ankle inversion    Ankle eversion     (Blank rows = not tested)  LOWER EXTREMITY MMT:  MMT Right eval Left eval  Hip flexion    Hip extension    Hip abduction 4+ 4-  Hip adduction    Hip internal rotation    Hip external rotation    Knee flexion    Knee extension 4+ 4-  Ankle dorsiflexion    Ankle plantarflexion    Ankle inversion    Ankle eversion     (Blank rows = not tested)  LOWER EXTREMITY SPECIAL TESTS:  Knee special tests: Lateral pull sign: positive  and Patellafemoral grind test: positive   FUNCTIONAL TESTS:  30 seconds chair stand test  GAIT: Distance walked: 60ft x2 Assistive device utilized: None Level of assistance: Complete Independence Comments: unremarkable   TODAY'S TREATMENT:        [facility] Adult PT Treatment:                                                DATE: 06/14/23 Therapeutic Exercise: Nustep L4 8 min QS 3s 15x SLR 2# 15x SAQ 2# 15x Supine hip fallouts GTB 15x B, 15/15 unilaterally Bridge with adduction 15x FAQ with adduction 15x TKE GTB 15x Taping applied to L knee using medial glide technique.  Patient instructed to remove tape with any increased pain, skin irritation or in the event the tape loosens and can not be re-applied. Cautioned to never apply Leukotape directly over skin without protective cover roll in place.                                                                                                                       DATE: 06/03/23 Eval and HEP    PATIENT EDUCATION:  Education details: Discussed eval findings, rehab rationale and POC and patient is in agreement  Person educated: Patient Education method:  Explanation Education comprehension: verbalized understanding and needs further education  HOME EXERCISE PROGRAM: Access Code:  [email or web address] URL: [email or web address] Date: 06/03/2023 Prepared by: Reyes Kohut  Exercises - Supine Quad Set  - 2 x daily - 5 x weekly - 2 sets - 15 reps - 3s hold - Small Range Straight Leg Raise  - 2 x daily - 5 x weekly - 2 sets - 15 reps - 3s hold - Single Leg Heel Raise  - 2 x daily - 5 x weekly - 2 sets - 15 reps - Clamshell  - 2 x daily - 5 x weekly - 2 sets - 15 reps  ASSESSMENT:  CLINICAL IMPRESSION:  Good follow through with HEP.  Knee symptoms have been minimal.  Mild soreness in muscles from exercise as expected.  Good VMO activation palpated.  Applied McConnell tape despite absence of symptoms for soft tissue distention over time. Patient is a 38 y.o. female who was seen today for physical therapy evaluation and treatment for chronic L knee pain. Patient presents with decreased VMO/VL contraction times and lateral patellar pull with active knee extension.  Patient demos hyperextension and possible patella alta contributing to patellar instability and excess lateral tracking.  OBJECTIVE IMPAIRMENTS: decreased activity tolerance, decreased knowledge of condition, decreased mobility, difficulty walking, decreased strength, and improper body mechanics.   ACTIVITY LIMITATIONS: sitting, squatting, stairs, and transfers  PERSONAL FACTORS: Age, Fitness, and Time since onset of injury/illness/exacerbation are also affecting patient's functional outcome.   REHAB POTENTIAL: Good  CLINICAL DECISION MAKING: Stable/uncomplicated  EVALUATION COMPLEXITY: Low   GOALS: Goals reviewed with patient? No  SHORT TERM GOALS=LONG TERM GOALS: Target date: 08/04/23  Patient to demonstrate independence in HEP  Baseline: Access Code: [email or web address] Goal status: Ongoing  2.  Patient will acknowledge 2/10 pain at least once during episode of care   Baseline:  4/10 Goal status: Ongoing  3.  Increase L knee strength to 4/5 Baseline:  MMT Right eval Left eval  Hip flexion    Hip extension    Hip abduction 4+ 4-  Hip adduction    Hip internal rotation    Hip external rotation    Knee flexion    Knee extension 4+ 4-   Goal status: INITIAL   PLAN:  PT FREQUENCY: 1-2x/week  PT DURATION: 6 weeks  PLANNED INTERVENTIONS: 97164- PT Re-evaluation, 97110-Therapeutic exercises, 97530- Therapeutic activity, 97112- Neuromuscular re-education, 97535- Self Care, [id number]- Manual therapy, 97116- Gait training, and Dry Needling  PLAN FOR NEXT SESSION: HEP review and update, manual techniques as appropriate, aerobic tasks, ROM and flexibility activities, strengthening and PREs, TPDN, gait and balance training as needed     Reyes CHRISTELLA Kohut, PT 06/27/2023, 9:36 AM

## 2023-06-28 ENCOUNTER — Ambulatory Visit: Payer: 59

## 2023-07-04 NOTE — Therapy (Signed)
 OUTPATIENT PHYSICAL THERAPY DISCHARGE   Patient Name: [name] MRN: [id number] DOB:31-Aug-1985, 38 y.o., female Today's Date: 07/06/2023 PHYSICAL THERAPY DISCHARGE SUMMARY  Visits from Start of Care: 3  Current functional level related to goals / functional outcomes: Goals met   Remaining deficits: weakness   Education / Equipment: HEP   Patient agrees to discharge. Patient goals were met. Patient is being discharged due to being pleased with the current functional level.  END OF SESSION:  PT End of Session - 07/06/23 1133     Visit Number 3    Number of Visits 4    Date for PT Re-Evaluation 08/04/23    Authorization Type [employer] [facility]    PT Start Time 1130    PT Stop Time 1210    PT Time Calculation (min) 40 min    Activity Tolerance Patient tolerated treatment well    Behavior During Therapy WFL for tasks assessed/performed               Past Medical History:  Diagnosis Date   Anemia    Back pain    BPES syndrome    Enlarged thyroid     Infertility, female    Knee dislocation 2010   Left   Marginal insertion of umbilical cord affecting management of mother in third trimester 03/20/2022   Obesity    Palpitations    PCOS (polycystic ovarian syndrome)    PONV (postoperative nausea and vomiting)    Prediabetes    Premature ovarian failure    Vitamin B 12 deficiency    Vitamin D  deficiency    Wears eyeglasses    Past Surgical History:  Procedure Laterality Date   ADENOIDECTOMY     EYE SURGERY Bilateral    NASAL SEPTOPLASTY W/ TURBINOPLASTY Bilateral 03/04/2017   Procedure: NASAL SEPTOPLASTY WITH BILATERAL INFERIOR TURBINATE REDUCTION;  Surgeon: Ammon Bales, MD;  Location: [facility] OR;  Service: ENT;  Laterality: Bilateral;   NASAL SEPTUM SURGERY  03/04/2017   TONSILLECTOMY     TONSILLECTOMY AND ADENOIDECTOMY Bilateral 03/04/2017   Procedure: TONSILLECTOMY;  Surgeon: Ammon Bales, MD;  Location: [facility] OR;  Service: ENT;  Laterality: Bilateral;    Patient Active Problem List   Diagnosis Date Noted   Obesity, Beginning BMI 55.19 08/17/2022   Elevated alanine aminotransferase (ALT) level 08/17/2022   BMI 45.0-49.9, adult (HCC) 07/20/2022   Obesity, Beginning BMI 55.19 07/20/2022   Breast feeding status of mother 04/19/2022   Adjustment disorder with anxiety 06/08/2021   Other allergic rhinitis 04/21/2021   Other hyperlipidemia, pure 03/25/2020   FH: heart disease 02/13/2020   SOBOE (shortness of breath on exertion) 11/02/2017   Class 3 severe obesity with serious comorbidity and body mass index (BMI) of 50.0 to 59.9 in adult (HCC) 10/08/2015   Vitamin D  deficiency 10/16/2014   Prediabetes 10/16/2014   B12 deficiency anemia 09/02/2014   PCOS (polycystic ovarian syndrome) 08/01/2014   BPES syndrome 08/01/2014    PCP: Mariel Shope, DO   REFERRING PROVIDER: Samara Crest, PA  REFERRING DIAG: M17.12 (ICD-10-CM) - Unilateral primary osteoarthritis, left knee  THERAPY DIAG:  Chronic pain of left knee  Muscle weakness (generalized)  Rationale for Evaluation and Treatment: Rehabilitation  ONSET DATE: chronic  SUBJECTIVE:   SUBJECTIVE STATEMENT: Has not experienced any pain in her L knee over several weeks.    PERTINENT HISTORY: Patient has left knee osteoarthritis. She is only 38 years old she is very young at this time. I talked her about this as  well as possible treatment options. I offered her a cortisone injection today to see if this help with the swelling to help increase her range of motion back. I am also going to get her started in some physical therapy to see if they can give her some exercises that she can start doing at home to start working on strengthening of her quads. I am going to order some gel injections to possibly get started for her to hopefully help increase along cavity of her knee. All questions encouraged and answered for the patient today.  PAIN:  Are you having pain? Yes: NPRS scale:  2/10 Pain location: L knee Pain description: ache Aggravating factors: prolonged positions, squatting Relieving factors: position changes, CSI  PRECAUTIONS: None  RED FLAGS: None   WEIGHT BEARING RESTRICTIONS: No  FALLS:  Has patient fallen in last 6 months? No  OCCUPATION: NP  PLOF: Independent  PATIENT GOALS: To manage my knee pain  NEXT MD VISIT: TBD  OBJECTIVE:  Note: Objective measures were completed at Evaluation unless otherwise noted.  DIAGNOSTIC FINDINGS: none  PATIENT SURVEYS:  FOTO 69(79 predicted) ; 07/06/23 75%  MUSCLE LENGTH: Hamstrings: Right 90 deg; Left 90 deg  POSTURE:  pes cavus B, mild varus B  PALPATION: unremarkable  LOWER EXTREMITY ROM: WNL  Active ROM Right eval Left eval  Hip flexion    Hip extension    Hip abduction    Hip adduction    Hip internal rotation    Hip external rotation    Knee flexion    Knee extension    Ankle dorsiflexion    Ankle plantarflexion    Ankle inversion    Ankle eversion     (Blank rows = not tested)  LOWER EXTREMITY MMT:  MMT Right eval Left eval L 07/06/23  Hip flexion     Hip extension     Hip abduction 4+ 4- 4  Hip adduction     Hip internal rotation     Hip external rotation     Knee flexion     Knee extension 4+ 4- 4  Ankle dorsiflexion     Ankle plantarflexion     Ankle inversion     Ankle eversion      (Blank rows = not tested)  LOWER EXTREMITY SPECIAL TESTS:  Knee special tests: Lateral pull sign: positive  and Patellafemoral grind test: positive   FUNCTIONAL TESTS:  30 seconds chair stand test  GAIT: Distance walked: 28ft x2 Assistive device utilized: None Level of assistance: Complete Independence Comments: unremarkable   TODAY'S TREATMENT:      [facility] Adult PT Treatment:                                                DATE: 07/06/23 Therapeutic Exercise: Nustep L4 6 min L ITB stertch STS 10x 2 in step downs 10x S/L L clams BlaTB 15x HEP review and update    [facility] Adult PT Treatment:                                                DATE: 06/14/23 Therapeutic Exercise: Nustep L4 8 min QS 3s 15x SLR 2# 15x SAQ 2# 15x Supine hip fallouts GTB 15x  B, 15/15 unilaterally Bridge with adduction 15x FAQ with adduction 15x TKE GTB 15x Taping applied to L knee using medial glide technique.  Patient instructed to remove tape with any increased pain, skin irritation or in the event the tape loosens and can not be re-applied. Cautioned to never apply Leukotape directly over skin without protective cover roll in place.                                                                                                                       DATE: 06/03/23 Eval and HEP    PATIENT EDUCATION:  Education details: Discussed eval findings, rehab rationale and POC and patient is in agreement  Person educated: Patient Education method: Explanation Education comprehension: verbalized understanding and needs further education  HOME EXERCISE PROGRAM: Access Code: [email or web address] URL: [email or web address] Date: 07/06/2023 Prepared by: Vikrant Pryce  Exercises - Small Range Straight Leg Raise  - 2 x daily - 5 x weekly - 2 sets - 15 reps - 3s hold - Clamshell  - 2 x daily - 5 x weekly - 2 sets - 15 reps - Lateral Step Down  - 2 x daily - 5 x weekly - 2 sets - 10 reps - 30s hold - Supine ITB Stretch with Strap  - 2 x daily - 5 x weekly - 1 sets - 2 reps - 30s hold - Sit to Stand Without Arm Support  - 2 x daily - 5 x weekly - 1 sets - 10 reps  ASSESSMENT:  CLINICAL IMPRESSION:  Patient goals met and she feels confident to transition to I management.  Patient is a 38 y.o. female who was seen today for physical therapy evaluation and treatment for chronic L knee pain. Patient presents with decreased VMO/VL contraction times and lateral patellar pull with active knee extension.  Patient demos hyperextension and possible patella alta contributing to patellar  instability and excess lateral tracking.  OBJECTIVE IMPAIRMENTS: decreased activity tolerance, decreased knowledge of condition, decreased mobility, difficulty walking, decreased strength, and improper body mechanics.   ACTIVITY LIMITATIONS: sitting, squatting, stairs, and transfers  PERSONAL FACTORS: Age, Fitness, and Time since onset of injury/illness/exacerbation are also affecting patient's functional outcome.   REHAB POTENTIAL: Good  CLINICAL DECISION MAKING: Stable/uncomplicated  EVALUATION COMPLEXITY: Low   GOALS: Goals reviewed with patient? No  SHORT TERM GOALS=LONG TERM GOALS: Target date: 08/04/23  Patient to demonstrate independence in HEP  Baseline: Access Code: [email or web address] Goal status: Met  2.  Patient will acknowledge 2/10 pain at least once during episode of care   Baseline: 4/10; 07/06/23 1/10 Goal status: Met  3.  Increase L knee strength to 4/5 Baseline:  MMT Right eval Left eval L 07/06/23  Hip flexion     Hip extension     Hip abduction 4+ 4- 4  Hip adduction     Hip internal rotation     Hip external rotation     Knee flexion     Knee extension 4+ 4-  4   Goal status: Met   PLAN:  PT FREQUENCY: 1-2x/week  PT DURATION: 6 weeks  PLANNED INTERVENTIONS: 97164- PT Re-evaluation, 97110-Therapeutic exercises, 97530- Therapeutic activity, 97112- Neuromuscular re-education, 97535- Self Care, 16109- Manual therapy, 97116- Gait training, and Dry Needling  PLAN FOR NEXT SESSION: HEP review and update, manual techniques as appropriate, aerobic tasks, ROM and flexibility activities, strengthening and PREs, TPDN, gait and balance training as needed     Eldon Greenland, PT 07/06/2023, 12:26 PM

## 2023-07-05 ENCOUNTER — Ambulatory Visit (INDEPENDENT_AMBULATORY_CARE_PROVIDER_SITE_OTHER): Payer: 59 | Admitting: Physician Assistant

## 2023-07-05 ENCOUNTER — Encounter (INDEPENDENT_AMBULATORY_CARE_PROVIDER_SITE_OTHER): Payer: Self-pay | Admitting: Physician Assistant

## 2023-07-05 VITALS — BP 125/82 | HR 61 | Temp 97.8°F | Ht 69.0 in | Wt 311.0 lb

## 2023-07-05 DIAGNOSIS — Z6841 Body Mass Index (BMI) 40.0 and over, adult: Secondary | ICD-10-CM | POA: Diagnosis not present

## 2023-07-05 DIAGNOSIS — E669 Obesity, unspecified: Secondary | ICD-10-CM | POA: Diagnosis not present

## 2023-07-05 DIAGNOSIS — E282 Polycystic ovarian syndrome: Secondary | ICD-10-CM

## 2023-07-05 DIAGNOSIS — E559 Vitamin D deficiency, unspecified: Secondary | ICD-10-CM

## 2023-07-05 DIAGNOSIS — R7303 Prediabetes: Secondary | ICD-10-CM

## 2023-07-05 DIAGNOSIS — E7849 Other hyperlipidemia: Secondary | ICD-10-CM | POA: Diagnosis not present

## 2023-07-05 NOTE — Progress Notes (Signed)
 SUBJECTIVE:  Chief Complaint: Obesity  Interim History: She is down 2 lbs since her last visit. Down total of 52 lbs TBW loss of 14.33% [name], a 38 year old individual with obesity, prediabetes, and polycystic ovary syndrome, presents for a follow-up visit regarding her obesity treatment plan. She is currently on metformin  500mg  daily for prediabetes, Premarin  and Provera  for polycystic ovaries, and over-the-counter vitamin D  2000 units daily.  Over the holiday period, she reported a slight weight gain but has since returned to a disciplined regimen of logging all food intake, which has resulted in a consistent weight loss of approximately two pounds per week. She has also noticed an increase in muscle mass and a decrease in adipose tissue, which she attributes to physical therapy for a knee injury.  The knee injury, a dislocated kneecap from many years ago, had been causing weakness and soreness, which was initially managed with a steroid injection. However, the patient found that physical therapy has drastically improved the condition of her knee.  In terms of dietary habits, the patient has set a goal to log everything she eats and avoid [employer] for the month. She has been successful so far, despite some scheduling challenges. She has also been exploring different protein-based snacks and meal prepping strategies to help manage hunger and maintain her calorie goals.  The patient's next goal is to incorporate strength training into her routine in February. She expressed some concern about potential increased hunger from working out, but plans to monitor her hunger levels and focus on protein intake.  Overall, the patient is making steady progress in her weight loss journey, demonstrating discipline in dietary habits and a commitment to physical activity. She is scheduled for a follow-up visit in February. [name] is here to discuss her progress with her obesity treatment plan. She  is on the keeping a food journal and adhering to recommended goals of 1700 calories and 110-130 grams of protein and states she is following her eating plan approximately 100 % of the time. She states she is not exercising 0 minutes 0 times per week but has been in Physical therapy for her left knee.    OBJECTIVE: Visit Diagnoses: Problem List Items Addressed This Visit     Vitamin D  deficiency   Prediabetes - Primary   Other hyperlipidemia, pure   Obesity, Beginning BMI 55.19    Obesity 38 year old on a weight loss plan, achieving a healthy weight loss of 2 pounds per week since January 1st 2025.  2.4 lbs  Increased muscle mass and decreased adipose tissue by 5.2 pounds. Struggling with meal planning and hunger management but making progress with dietary adjustments. Discussed the importance of protein intake to manage hunger and the benefits of strength training for increasing metabolism and muscle mass. - Continue logging food intake - Avoid [employer] for January- Feb.  - Incorporate strength training in February - Focus on protein intake to manage hunger - Consider high-protein snacks like Greek yogurt, Fairlife milk, and protein chips  Prediabetes Lab Results  Component Value Date   HGBA1C 5.2 05/24/2023   HGBA1C 5.4 10/12/2022   HGBA1C 5.4 05/10/2022   Lab Results  Component Value Date   LDLCALC 120 (H) 05/24/2023   CREATININE 0.61 05/24/2023    On metformin  500 mg daily. Continued monitoring and lifestyle modifications are essential to prevent progression to diabetes.  - Continue metformin  500 mg daily Continue working on nutrition plan to decrease simple carbohydrates, increase lean proteins and exercise to promote weight  loss, improve glycemic control and prevent progression to Type 2 diabetes.    Polycystic Ovary Syndrome (PCOS) On Premarin  and Provera  for symptom management. Continued medication adherence and lifestyle modifications are essential. - Continue  Premarin  and Provera  as prescribed Continue metformin    Knee Pain (Post-Dislocation) Received a steroid injection in November, which provided immediate relief. Currently undergoing physical therapy, significantly improving knee strength and reducing soreness. Discussed the benefits of physical therapy for long-term knee health and the importance of continuing exercises to maintain strength. - Continue physical therapy for knee strengthening  General Health Maintenance Taking over-the-counter vitamin D  2000 units daily. Discussed the importance of meal planning and high-protein snacks for overall health and weight management. - Continue vitamin D  2000 units daily - Consider using prepared meats like Kevin's for meal planning - Incorporate high-protein snacks like Greek yogurt, Fairlife milk, and protein chips  Follow-up - Follow-up appointment on February 11th at 8:20 AM with Dr. Verdon.  Anthropometric Measurements Height: 5' 9 (1.753 m) Weight: (!) 311 lb (141.1 kg) BMI (Calculated): 45.91 Weight at Last Visit: 313 lb Weight Lost Since Last Visit: 2 lb Weight Gained Since Last Visit: 0 Starting Weight: 363 lb Total Weight Loss (lbs): 52 lb (23.6 kg) Peak Weight: 375 lb   Body Composition  Body Fat %: 51.8 % Fat Mass (lbs): 161.2 lbs Muscle Mass (lbs): 142.4 lbs Total Body Water (lbs): 112.6 lbs Visceral Fat Rating : 16   Other Clinical Data Fasting: no Labs: no Today's Visit #: 20 Starting Date: 11/02/17     ASSESSMENT AND PLAN:  Diet: [name] is currently in the action stage of change. As such, her goal is to continue with weight loss efforts. She has agreed to keeping a food journal and adhering to recommended goals of 1700 calories and 130 grams of protein.  Exercise: [name] has been instructed  begin working on a strengthening program 2-3 times weekly  for weight loss and overall health benefits.   Behavior Modification:  We discussed the following  Behavioral Modification Strategies today: increasing lean protein intake, decreasing simple carbohydrates, increasing vegetables, increase H2O intake, increase high fiber foods, meal planning and cooking strategies, better snacking choices, planning for success, and keep a strict food journal. We discussed various medication options to help Ziona with her weight loss efforts and we both agreed to continue metformin  for primary indication of prediabetes and continue to work on nutritional and behavioral strategies to promote weight loss.  .  Return in about 4 weeks (around 08/02/2023).SABRA She was informed of the importance of frequent follow up visits to maximize her success with intensive lifestyle modifications for her multiple health conditions.  Attestation Statements:   Reviewed by clinician on day of visit: allergies, medications, problem list, medical history, surgical history, family history, social history, and previous encounter notes.   Time spent on visit including pre-visit chart review and post-visit care and charting was 37 minutes.    Amala Petion, PA-C

## 2023-07-06 ENCOUNTER — Ambulatory Visit: Payer: 59 | Attending: Physician Assistant

## 2023-07-06 DIAGNOSIS — M25562 Pain in left knee: Secondary | ICD-10-CM | POA: Diagnosis not present

## 2023-07-06 DIAGNOSIS — M6281 Muscle weakness (generalized): Secondary | ICD-10-CM | POA: Insufficient documentation

## 2023-07-06 DIAGNOSIS — G8929 Other chronic pain: Secondary | ICD-10-CM | POA: Diagnosis not present

## 2023-07-12 ENCOUNTER — Other Ambulatory Visit: Payer: Self-pay

## 2023-07-12 ENCOUNTER — Other Ambulatory Visit (HOSPITAL_COMMUNITY): Payer: Self-pay

## 2023-07-15 ENCOUNTER — Encounter: Payer: Self-pay | Admitting: Family Medicine

## 2023-08-02 ENCOUNTER — Encounter (INDEPENDENT_AMBULATORY_CARE_PROVIDER_SITE_OTHER): Payer: Self-pay | Admitting: Family Medicine

## 2023-08-02 ENCOUNTER — Other Ambulatory Visit (HOSPITAL_COMMUNITY): Payer: Self-pay

## 2023-08-02 ENCOUNTER — Ambulatory Visit (INDEPENDENT_AMBULATORY_CARE_PROVIDER_SITE_OTHER): Payer: 59 | Admitting: Family Medicine

## 2023-08-02 VITALS — BP 129/76 | HR 67 | Temp 97.7°F | Ht 69.0 in | Wt 307.0 lb

## 2023-08-02 DIAGNOSIS — E88819 Insulin resistance, unspecified: Secondary | ICD-10-CM

## 2023-08-02 DIAGNOSIS — E669 Obesity, unspecified: Secondary | ICD-10-CM

## 2023-08-02 DIAGNOSIS — E2839 Other primary ovarian failure: Secondary | ICD-10-CM

## 2023-08-02 DIAGNOSIS — Z6841 Body Mass Index (BMI) 40.0 and over, adult: Secondary | ICD-10-CM

## 2023-08-02 DIAGNOSIS — R7303 Prediabetes: Secondary | ICD-10-CM

## 2023-08-02 MED ORDER — METFORMIN HCL 500 MG PO TABS
500.0000 mg | ORAL_TABLET | Freq: Two times a day (BID) | ORAL | 0 refills | Status: DC
  Filled 2023-08-02 – 2023-09-14 (×5): qty 180, 90d supply, fill #0

## 2023-08-02 NOTE — Progress Notes (Signed)
[facility]  Office: [phone]  /  Fax: [phone]  WEIGHT SUMMARY AND BIOMETRICS  Anthropometric Measurements Height: 5\' 9"  (1.753 m) Weight: (!) 307 lb (139.3 kg) BMI (Calculated): 45.32 Weight at Last Visit: 311 lb Weight Lost Since Last Visit: 4 lb Weight Gained Since Last Visit: 0 Starting Weight: 363 lb Total Weight Loss (lbs): 56 lb (25.4 kg) Peak Weight: 375 lb   Body Composition  Body Fat %: 51.4 % Fat Mass (lbs): 158 lbs Muscle Mass (lbs): 141.6 lbs Total Body Water (lbs): 110.6 lbs Visceral Fat Rating : 15   Other Clinical Data Fasting: No Labs: No Today's Visit #: 3 Starting Date: 11/02/17    Chief Complaint: OBESITY    History of Present Illness   [name] is a 38 year old female who presents for weight management.  She is focused on weight management and adheres to a journaling plan with a calorie goal of 1600-1700 calories and over 130 grams of protein daily, achieving this about 90% of the time. Despite her efforts, she is frustrated with her weight loss progress, having lost four pounds in the last month, with three pounds being fat, including visceral fat.  Her dietary habits include meals such as peanut butter sandwiches with PB Fit, chicken thighs with green beans, and chicken sausage orzo with laughing cow and cottage cheese. Occasionally, she uses protein shakes as a meal replacement. She experiences hunger, particularly when at home, and identifies this as possibly psychological hunger.  She is not currently engaging in regular exercise but has recently started strength training, which coincided with a movement on the scale. Her motivation for strength training is to increase her metabolism and allow for more caloric intake.  She is currently on metformin, taking 500 mg daily, and has a history of being on 2000 mg daily prior to pregnancy. She is concerned about her insulin resistance, which was previously higher, and notes that her fasting  glucose and A1c levels are within normal ranges.  She has a family history of diabetes, with her twin sister having gestational diabetes and now type 2 diabetes.          PHYSICAL EXAM:  Blood pressure 129/76, pulse 67, temperature 97.7 F (36.5 C), height 5\' 9"  (1.753 m), weight (!) 307 lb (139.3 kg), last menstrual period 07/04/2023, SpO2 100%, unknown if currently breastfeeding. Body mass index is 45.34 kg/m.  DIAGNOSTIC DATA REVIEWED:  BMET    Component Value Date/Time   NA 141 05/24/2023 0920   K 4.3 05/24/2023 0920   CL 105 05/24/2023 0920   CO2 23 05/24/2023 0920   GLUCOSE 74 05/24/2023 0920   GLUCOSE 77 05/10/2022 1329   BUN 12 05/24/2023 0920   CREATININE 0.61 05/24/2023 0920   CREATININE 0.63 05/10/2022 1329   CALCIUM 8.9 05/24/2023 0920   GFRNONAA 116 07/15/2020 1448   GFRAA 134 07/15/2020 1448   Lab Results  Component Value Date   HGBA1C 5.2 05/24/2023   HGBA1C 5.7 09/10/2014   Lab Results  Component Value Date   INSULIN 6.1 05/24/2023   INSULIN 35.6 (H) 11/02/2017   Lab Results  Component Value Date   TSH 1.660 10/12/2022   CBC    Component Value Date/Time   WBC 6.1 05/24/2023 0920   WBC 7.4 05/10/2022 1329   RBC 4.30 05/24/2023 0920   RBC 4.32 05/10/2022 1329   HGB 12.2 05/24/2023 0920   HCT 38.5 05/24/2023 0920   PLT 267 05/24/2023 0920   MCV 90  05/24/2023 0920   MCH 28.4 05/24/2023 0920   MCH 28.9 05/10/2022 1329   MCHC 31.7 05/24/2023 0920   MCHC 33.7 05/10/2022 1329   RDW 12.7 05/24/2023 0920   Iron Studies    Component Value Date/Time   IRON 30 11/02/2017 0904   TIBC 337 11/02/2017 0904   FERRITIN 28 11/02/2017 0904   IRONPCTSAT 9 (LL) 11/02/2017 0904   Lipid Panel     Component Value Date/Time   CHOL 207 (H) 05/24/2023 0920   TRIG 115 05/24/2023 0920   HDL 67 05/24/2023 0920   CHOLHDL 3.0 05/10/2022 1329   VLDL 20.6 05/25/2017 0843   LDLCALC 120 (H) 05/24/2023 0920   LDLCALC 110 (H) 05/10/2022 1329   Hepatic  Function Panel     Component Value Date/Time   PROT 6.3 05/24/2023 0920   ALBUMIN 4.0 05/24/2023 0920   AST 11 05/24/2023 0920   ALT 13 05/24/2023 0920   ALKPHOS 76 05/24/2023 0920   BILITOT 0.3 05/24/2023 0920      Component Value Date/Time   TSH 1.660 10/12/2022 0825   Nutritional Lab Results  Component Value Date   VD25OH 40.9 05/24/2023   VD25OH 57.3 10/12/2022   VD25OH 58 05/10/2022     Assessment and Plan    Obesity She is working on weight loss through dietary changes, including a calorie goal of 1600-1700 calories and over 130 grams of protein daily. She has lost 4 pounds in the last month, with 3 pounds being fat and a reduction in visceral fat. She has started intermittent strength training but reports excessive hunger, particularly at home, which may be psychological. Emphasized focusing on behaviors rather than daily weight measurements and recommended strength training to increase muscle mass and metabolism. - Recommend strength training exercises, including the use of ankle weights starting at 1 pound and gradually increasing to 5 pounds. - Advise against daily weight measurements; recommend weighing every couple of weeks. - Increase metformin to 500 mg twice daily to help manage hunger and stabilize blood sugar levels.  Insulin Resistance Current labs show fasting glucose of 74, A1c of 5.2, and insulin of 6. Explained that metformin helps stabilize blood sugar levels and reduce hunger signals. She is currently on 500 mg of metformin daily and is willing to increase the dose. Discussed the potential benefits of increasing metformin, including better blood sugar stabilization and reduced hunger signals. Explained that metformin can help prevent the progression to diabetes, which she is particularly concerned about due to her family history. - Increase metformin to 500 mg twice daily. - Monitor blood sugar levels and hunger signals.  Premature Ovarian Failure She is  on estrogen replacement therapy. Discussed the importance of maintaining muscle mass, especially post-menopause, to prevent further metabolic decline. Emphasized the benefits of regular strength training in maintaining muscle mass and improving overall health. - Continue estrogen replacement therapy. - Encourage regular strength training to maintain muscle mass and improve overall health.  Follow-up - Ensure the next appointment is scheduled. - Monitor progress and adjust treatment plan as necessary.       She was informed of the importance of frequent follow up visits to maximize her success with intensive lifestyle modifications for her multiple health conditions.    Quillian Quince, MD

## 2023-08-10 ENCOUNTER — Other Ambulatory Visit: Payer: Self-pay

## 2023-08-10 ENCOUNTER — Other Ambulatory Visit (HOSPITAL_COMMUNITY): Payer: Self-pay

## 2023-08-30 ENCOUNTER — Encounter (INDEPENDENT_AMBULATORY_CARE_PROVIDER_SITE_OTHER): Payer: Self-pay | Admitting: Physician Assistant

## 2023-08-30 ENCOUNTER — Ambulatory Visit (INDEPENDENT_AMBULATORY_CARE_PROVIDER_SITE_OTHER): Payer: 59 | Admitting: Physician Assistant

## 2023-08-30 VITALS — BP 126/80 | HR 61 | Temp 97.8°F | Ht 69.0 in | Wt 305.0 lb

## 2023-08-30 DIAGNOSIS — E559 Vitamin D deficiency, unspecified: Secondary | ICD-10-CM

## 2023-08-30 DIAGNOSIS — E88819 Insulin resistance, unspecified: Secondary | ICD-10-CM | POA: Diagnosis not present

## 2023-08-30 DIAGNOSIS — E669 Obesity, unspecified: Secondary | ICD-10-CM

## 2023-08-30 DIAGNOSIS — Z6841 Body Mass Index (BMI) 40.0 and over, adult: Secondary | ICD-10-CM | POA: Diagnosis not present

## 2023-08-30 DIAGNOSIS — Z3009 Encounter for other general counseling and advice on contraception: Secondary | ICD-10-CM

## 2023-08-30 NOTE — Progress Notes (Signed)
 SUBJECTIVE: Discussed the use of AI scribe software for clinical note transcription with the patient, who gave verbal consent to proceed.  Chief Complaint: Obesity  Interim History: She is down 2 lbs since last visit Down 58 lbs overall TBW loss of ~ 16%  [name] is here to discuss her progress with her obesity treatment plan. She is on the keeping a food journal and adhering to recommended goals of 1600-1700 calories and 130 grams of protein and states she is following her eating plan approximately 80 % of the time. She states she is exercising cardio/strength training 30 minutes 1 times per week. The patient is a 38 year old with obesity who presents for follow-up of her obesity treatment plan.  She has a history of obesity, prediabetes with remote A1c values in the prediabetic range, vitamin D deficiency, and hyperlipidemia. Her current medications include cholecalciferol 2000 units daily, Premarin 0.625 mg daily, Provera 5 mg for 12 days monthly, metformin 500 mg twice daily, and a prenatal vitamin daily. Over the past several weeks, she has continued to make progress with her weight loss journey, losing an additional two pounds, totaling a weight loss of 58 pounds. Her current BMI is 45.02. She follows a journaling plan of 1600-1700 calories and 130 grams of protein daily at 80% adherence.  She struggles with finding time for exercise, noting that running around after her toddler does not count as exercise. She has been more active lately, walking in her yard and raking leaves, which she believes contributed to her recent weight loss. 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 She engages in cardio or strength training for 30 minutes one day a week.  Her knee is much better, although inclines can still cause discomfort. She previously dislocated her kneecap, and physical therapy has helped it return to almost normal. She notes that her outer ligament is stronger, which  pulls her kneecap, causing stress on inclines.  She is considering expanding her family and starting IVF treatments in the spring or summer. She recalls struggling with food intake during her previous pregnancy, having been encouraged to eat 2600 calories a day, which felt excessive. She is now more aware of her caloric needs and is trying to manage her hunger, distinguishing between mental and true hunger.  OBJECTIVE: Visit Diagnoses: Problem List Items Addressed This Visit     Vitamin D deficiency   BMI 45.0-49.9, adult (HCC)   Obesity, Beginning BMI 55.19   Other Visit Diagnoses       Insulin resistance    -  Primary     Family planning         Obesity   She has lost 58 pounds, with a current BMI of 45.02. She adheres to a dietary plan of 1600-1700 calories and 130 grams of protein daily at 80% adherence. She engages in cardio or strength training once a week for 30 minutes. Increasing exercise, particularly strength training, is emphasized to maintain muscle mass and aid further weight loss. Passive strengthening exercises using ankle and wrist weights during daily activities are encouraged.   - Continue current dietary plan of 1600-1700 calories and 130 grams of protein daily   - Increase exercise frequency, focusing on strength training   - Consider using ankle and wrist weights for passive strengthening during daily activities    Prediabetes   She has insulin resistance with previous A1c levels in the prediabetic range remotely.  Currently on metformin 500 mg twice daily. Weight  loss and dietary changes are likely beneficial in managing her prediabetes.   Lab Results  Component Value Date   HGBA1C 5.2 05/24/2023   HGBA1C 5.4 10/12/2022   HGBA1C 5.4 05/10/2022   Lab Results  Component Value Date   LDLCALC 120 (H) 05/24/2023   CREATININE 0.61 05/24/2023   INSULIN  Date Value Ref Range Status  05/24/2023 6.1 2.6 - 24.9 uIU/mL Final  ]Continue working on nutrition plan to  decrease simple carbohydrates, increase lean proteins and exercise to promote weight loss, improve glycemic control and prevent progression to Type 2 diabetes.  - Continue metformin 500 mg twice daily  . No refill needed today.     Knee pain   She reports improvement in knee pain, previously exacerbated by inclines. Physical therapy has helped, and the knee is almost back to normal. Using ankle weights should not exacerbate her knee pain.   - Consider using ankle weights if they do not exacerbate knee pain    Vitamin D deficiency   She is on cholecalciferol 2000 units daily for vitamin D deficiency.  No side effects. Last vitamin D Lab Results  Component Value Date   VD25OH 40.9 05/24/2023   Low vitamin D levels can be associated with adiposity and may result in leptin resistance and weight gain. Also associated with fatigue.  Currently on vitamin D supplementation without any adverse effects such as nausea, vomiting or muscle weakness.  - Continue cholecalciferol 2000 units daily    Family planning   She is considering expanding her family and starting IVF treatment in the coming months. Concerns about managing diet and weight during pregnancy were expressed, given her previous experience with excessive caloric intake recommendations. Strategies for managing diet and weight during pregnancy are needed.   - Discuss strategies for managing diet and weight during pregnancy and may benefit from consultation with a nutritionist as well .   Vitals Temp: 97.8 F (36.6 C) BP: 126/80 Pulse Rate: 61 SpO2: 100 %   Anthropometric Measurements Height: 5\' 9"  (1.753 m) Weight: (!) 305 lb (138.3 kg) BMI (Calculated): 45.02 Weight at Last Visit: 307lb Weight Lost Since Last Visit: 2lb Weight Gained Since Last Visit: 0 Starting Weight: 363lb Total Weight Loss (lbs): 58 lb (26.3 kg) Peak Weight: 375lb   Body Composition  Body Fat %: 51.1 % Fat Mass (lbs): 156 lbs Muscle Mass (lbs): 141.6  lbs Total Body Water (lbs): 111 lbs Visceral Fat Rating : 15   Other Clinical Data Fasting: no Labs: no Today's Visit #: 77 Starting Date: 11/02/17     ASSESSMENT AND PLAN:  Diet: Adora is currently in the action stage of change. As such, her goal is to continue with weight loss efforts and has agreed to keeping a food journal and adhering to recommended goals of 1600-1700 calories and 130 grams of protein.   Exercise:  For substantial health benefits, adults should do at least 150 minutes (2 hours and 30 minutes) a week of moderate-intensity, or 75 minutes (1 hour and 15 minutes) a week of vigorous-intensity aerobic physical activity, or an equivalent combination of moderate- and vigorous-intensity aerobic activity. Aerobic activity should be performed in episodes of at least 10 minutes, and preferably, it should be spread throughout the week. and Adults should also include muscle-strengthening activities that involve all major muscle groups on 2 or more days a week.  Behavior Modification:  We discussed the following Behavioral Modification Strategies today: increasing lean protein intake, decreasing simple carbohydrates, increasing vegetables, increase  H2O intake, increase high fiber foods, meal planning and cooking strategies, emotional eating strategies , avoiding temptations, planning for success, and keep a strict food journal. We discussed various medication options to help Jonalyn with her weight loss efforts and we both agreed to continue to work on nutritional and behavioral strategies to promote weight loss.  .  No follow-ups on file.[name] She was informed of the importance of frequent follow up visits to maximize her success with intensive lifestyle modifications for her multiple health conditions.  Attestation Statements:   Reviewed by clinician on day of visit: allergies, medications, problem list, medical history, surgical history, family history, social history, and  previous encounter notes.   Time spent on visit including pre-visit chart review and post-visit care and charting was 33 minutes  Mikeal Winstanley,PA-C

## 2023-08-31 DIAGNOSIS — Z113 Encounter for screening for infections with a predominantly sexual mode of transmission: Secondary | ICD-10-CM | POA: Diagnosis not present

## 2023-09-07 ENCOUNTER — Other Ambulatory Visit: Payer: Self-pay

## 2023-09-07 ENCOUNTER — Other Ambulatory Visit (HOSPITAL_COMMUNITY): Payer: Self-pay

## 2023-09-08 ENCOUNTER — Other Ambulatory Visit: Payer: Self-pay

## 2023-09-08 ENCOUNTER — Other Ambulatory Visit (HOSPITAL_COMMUNITY): Payer: Self-pay

## 2023-09-09 ENCOUNTER — Other Ambulatory Visit (HOSPITAL_COMMUNITY): Payer: Self-pay

## 2023-09-09 ENCOUNTER — Other Ambulatory Visit: Payer: Self-pay

## 2023-09-14 ENCOUNTER — Ambulatory Visit (INDEPENDENT_AMBULATORY_CARE_PROVIDER_SITE_OTHER): Payer: Self-pay | Admitting: Family Medicine

## 2023-09-14 ENCOUNTER — Other Ambulatory Visit (HOSPITAL_COMMUNITY): Payer: Self-pay

## 2023-09-14 VITALS — BP 122/72 | HR 65 | Temp 98.3°F | Wt 317.0 lb

## 2023-09-14 DIAGNOSIS — Z0000 Encounter for general adult medical examination without abnormal findings: Secondary | ICD-10-CM | POA: Diagnosis not present

## 2023-09-14 NOTE — Patient Instructions (Addendum)
 Return in about 1 year (around 09/14/2024) for cpe (20 min).          Great to see you today.  I have refilled the medication(s) we provide.   If labs were collected or images ordered, we will inform you of  results once we have received them and reviewed. We will contact you either by echart message, or telephone call.  Please give ample time to the testing facility, and our office to run,  receive and review results. Please do not call inquiring of results, even if you can see them in your chart. We will contact you as soon as we are able. If it has been over 1 week since the test was completed, and you have not yet heard from [place], then please call us.    - echart message- for normal results that have been seen by the patient already.   - telephone call: abnormal results or if patient has not viewed results in their echart.  If a referral to a specialist was entered for you, please call us in 2 weeks if you have not heard from the specialist office to schedule.

## 2023-09-14 NOTE — Progress Notes (Signed)
 Patient ID: [name], female  DOB: [birth year], 38 y.o.   MRN: [id number] Patient Care Team    Relationship Specialty Notifications Start End  Vanessa Leatherwood, DO PCP - General Family Medicine  04/25/15   Vanessa Better, MD Consulting Physician Obstetrics and Gynecology  06/18/16   Vanessa Ouch, MD Consulting Physician Cardiology  06/18/16   Vanessa Glade, MD Consulting Physician Family Medicine  05/26/18    Comment: Weight loss clinic    Chief Complaint  Patient presents with   Annual Exam    Chronic Conditions Management.  Pt is     Subjective:  [name] is a 38 y.o.  Female  present for CPE. All past medical history, surgical history, allergies, family history, immunizations, medications and social history were updated in the electronic medical record today. All recent labs, ED visits and hospitalizations within the last year were reviewed.  She had her baby, [name], last month.   Health maintenance: upd Colonoscopy: No Fhx. Screen 47 Mammogram: no fhx screen 42 - completed for breast mass 4/202/2023. By gyn Cervical cancer screening: last pap: 2023> 5 yr,by: [name]- recent vaginal delivery 03/2022 Immunizations: tdap UTD 12/2021 UTD, Influenza UTD 02/2023 (encouraged yearly),  Infectious disease screening: HIV  and hep c UTD DEXA:  Consider early screen with BPES, low vit D.  Assistive device: none Oxygen [name]:[facility] Patient has a Dental home. Hospitalizations/ED visits: reviewed     03/23/2021    1:23 PM 10/27/2020    9:55 AM 02/13/2020    9:50 AM 05/22/2019    9:00 AM 11/30/2018   11:37 AM  Depression screen PHQ 2/9  Decreased Interest 0 0 0 0 0  Down, Depressed, Hopeless 0 0 0 0 0  PHQ - 2 Score 0 0 0 0 0  Altered sleeping 0      Tired, decreased energy 0      Change in appetite 0      Feeling bad or failure about yourself  0      Trouble concentrating 0      Moving slowly or fidgety/restless 0      Suicidal thoughts 0      PHQ-9  Score 0          03/23/2021    1:24 PM  GAD 7 : Generalized Anxiety Score  Nervous, Anxious, on Edge 0  Control/stop worrying 0  Worry too much - different things 0  Trouble relaxing 0  Restless 0  Easily annoyed or irritable 0  Afraid - awful might happen 0  Total GAD 7 Score 0     Immunization History  Administered Date(s) Administered   HPV 9-valent 08/01/2014, 09/10/2014, 01/28/2015   Influenza,inj,Quad PF,6+ Mos 03/21/2014, 02/25/2022   Influenza-Unspecified 03/22/2015, 03/15/2017, 04/06/2018, 03/16/2021, 03/04/2023   PFIZER(Purple Top)SARS-COV-2 Vaccination 06/18/2019, 07/07/2019, 03/28/2020   Tdap 08/01/2005, 06/09/2015, 01/14/2022    Past Medical History:  Diagnosis Date   Anemia    Back pain    BPES syndrome    Enlarged thyroid    Infertility, female    Knee dislocation 2010   Left   Marginal insertion of umbilical cord affecting management of mother in third trimester 03/20/2022   Obesity    Palpitations    PCOS (polycystic ovarian syndrome)    PONV (postoperative nausea and vomiting)    Prediabetes    Premature ovarian failure    Vitamin B 12 deficiency    Vitamin D deficiency    Wears  eyeglasses    No Known Allergies Past Surgical History:  Procedure Laterality Date   ADENOIDECTOMY     EYE SURGERY Bilateral    NASAL SEPTOPLASTY W/ TURBINOPLASTY Bilateral 03/04/2017   Procedure: NASAL SEPTOPLASTY WITH BILATERAL INFERIOR TURBINATE REDUCTION;  Surgeon: Vanessa Coho, MD;  Location: [facility] OR;  Service: ENT;  Laterality: Bilateral;   NASAL SEPTUM SURGERY  03/04/2017   TONSILLECTOMY     TONSILLECTOMY AND ADENOIDECTOMY Bilateral 03/04/2017   Procedure: TONSILLECTOMY;  Surgeon: Vanessa Coho, MD;  Location: [facility] OR;  Service: ENT;  Laterality: Bilateral;   Family History  Problem Relation Age of Onset   Hyperlipidemia Father    Hypertension Father    Heart disease Father    Sudden death Father        Age 31   Other Father        BPES   Heart  attack Father    Other Other        BPES   Other Other        BPES   Hyperlipidemia Mother    Hypertension Mother    Diabetes Mother    Anxiety disorder Mother    Obesity Mother    Other Brother        BPES   Other Sister        BPES   Other Paternal Uncle        BPES   Other Cousin        BPES   Other Cousin        BPES   Heart attack Maternal Grandmother    Heart disease Maternal Grandfather    Heart attack Maternal Grandfather    Lung cancer Maternal Grandfather    Heart disease Paternal Grandfather heartr attack   Heart attack Paternal Grandfather 50       MI   Sudden death Paternal Grandfather    Social History   Social History Narrative   Ms Earlene Plater lives alone. 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    Allergies as of 09/14/2023   No Known Allergies      Medication List        Accurate as of September 14, 2023 10:14 AM. If you have any questions, ask your nurse or doctor.          medroxyPROGESTERone 5 MG tablet Commonly known as: Provera Take 1 tablet (5 mg total) by mouth daily for 12 days.   metFORMIN 500 MG tablet Commonly known as: GLUCOPHAGE Take 1 tablet (500 mg total) by mouth 2 (two) times daily with a meal.   Premarin 0.625 MG tablet Generic drug: estrogens (conjugated) Take 1 tablet (0.625 mg total) by mouth daily.   PRENATAL PO Take 1 tablet by mouth daily.   Vitamin D3 50 MCG (2000 UT) capsule 2,000 IU qd What changed:  how much to take how to take this when to take this additional instructions        All past medical history, surgical history, allergies, family history, immunizations andmedications were updated in the EMR today and reviewed under the history and medication portions of their EMR.      ROS: 14 pt review of systems performed and negative (unless mentioned in an HPI)  Objective: BP 122/72   Pulse 65   Temp 98.3 F (36.8 C)   Wt (!) 317 lb (143.8 kg)   LMP 08/28/2023   SpO2  97%   BMI 46.81 kg/m  Physical Exam Vitals and nursing note reviewed.  Constitutional:      General: She is not in acute distress.    Appearance: Normal appearance. She is not ill-appearing or toxic-appearing.  HENT:     Head: Normocephalic and atraumatic.     Right Ear: Tympanic membrane, ear canal and external ear normal. There is no impacted cerumen.     Left Ear: Tympanic membrane, ear canal and external ear normal. There is no impacted cerumen.     Nose: No congestion or rhinorrhea.     Mouth/Throat:     Mouth: Mucous membranes are moist.     Pharynx: Oropharynx is clear. No oropharyngeal exudate or posterior oropharyngeal erythema.  Eyes:     General: No scleral icterus.       Right eye: No discharge.        Left eye: No discharge.     Extraocular Movements: Extraocular movements intact.     Conjunctiva/sclera: Conjunctivae normal.     Pupils: Pupils are equal, round, and reactive to light.  Cardiovascular:     Rate and Rhythm: Normal rate and regular rhythm.     Pulses: Normal pulses.     Heart sounds: Normal heart sounds. No murmur heard.    No friction rub. No gallop.  Pulmonary:     Effort: Pulmonary effort is normal. No respiratory distress.     Breath sounds: Normal breath sounds. No stridor. No wheezing, rhonchi or rales.  Chest:     Chest wall: No tenderness.  Abdominal:     General: Abdomen is flat. Bowel sounds are normal. There is no distension.     Palpations: Abdomen is soft. There is no mass.     Tenderness: There is no abdominal tenderness. There is no right CVA tenderness, left CVA tenderness, guarding or rebound.     Hernia: No hernia is present.  Musculoskeletal:        General: No swelling, tenderness or deformity. Normal range of motion.     Cervical back: Normal range of motion and neck supple. No rigidity or tenderness.     Right lower leg: No edema.     Left lower leg: No edema.  Lymphadenopathy:     Cervical: No cervical adenopathy.  Skin:     General: Skin is warm and dry.     Coloration: Skin is not jaundiced or pale.     Findings: No bruising, erythema, lesion or rash.  Neurological:     General: No focal deficit present.     Mental Status: She is alert and oriented to person, place, and time. Mental status is at baseline.     Cranial Nerves: No cranial nerve deficit.     Sensory: No sensory deficit.     Motor: No weakness.     Coordination: Coordination normal.     Gait: Gait normal.     Deep Tendon Reflexes: Reflexes normal.  Psychiatric:        Mood and Affect: Mood normal.        Behavior: Behavior normal.        Thought Content: Thought content normal.        Judgment: Judgment normal.     No results found.  Assessment/plan: [name] is a 38 y.o. female present for CPE Vitamin D deficiency/Breast feeding status of mother Vitamin D UTD-WNL She is taking 2000 units daily of vit D3  hyperlipidemia, pure/FH: heart disease Reviewed recent labs collected   Routine annual physical Patient was  encouraged to exercise greater than 150 minutes a week. Patient was encouraged to choose a diet filled with fresh fruits and vegetables, and lean meats. AVS provided to patient today for education/recommendation on gender specific health and safety maintenance. Colonoscopy: No Fhx. Screen 47 Mammogram: no fhx screen 42 - completed for breast mass 4/202/2023. By gyn Cervical cancer screening: last pap: 2023> 5 yr,by: [name]- recent vaginal delivery 03/2022 Immunizations: tdap UTD 12/2021 UTD, Influenza UTD 02/2023 (encouraged yearly),  Infectious disease screening: HIV  and hep c UTD DEXA:  Consider early screen with BPES, low vit D.   Return in about 1 year (around 09/14/2024) for cpe (20 min).  Orders Placed This Encounter  Procedures   TSH   Electronically signed by: Felix Pacini, DO [facility] [place]

## 2023-09-15 LAB — TSH: TSH: 1.82 u[IU]/mL (ref 0.35–5.50)

## 2023-09-16 ENCOUNTER — Encounter: Payer: Self-pay | Admitting: Family Medicine

## 2023-09-23 DIAGNOSIS — Z3169 Encounter for other general counseling and advice on procreation: Secondary | ICD-10-CM | POA: Diagnosis not present

## 2023-09-23 DIAGNOSIS — E2839 Other primary ovarian failure: Secondary | ICD-10-CM | POA: Diagnosis not present

## 2023-09-23 DIAGNOSIS — Z79890 Hormone replacement therapy (postmenopausal): Secondary | ICD-10-CM | POA: Diagnosis not present

## 2023-09-27 ENCOUNTER — Encounter (INDEPENDENT_AMBULATORY_CARE_PROVIDER_SITE_OTHER): Payer: Self-pay | Admitting: Family Medicine

## 2023-09-27 ENCOUNTER — Ambulatory Visit (INDEPENDENT_AMBULATORY_CARE_PROVIDER_SITE_OTHER): Payer: 59 | Admitting: Family Medicine

## 2023-09-27 VITALS — BP 114/69 | HR 64 | Temp 98.0°F | Ht 69.0 in | Wt 308.0 lb

## 2023-09-27 DIAGNOSIS — Z6841 Body Mass Index (BMI) 40.0 and over, adult: Secondary | ICD-10-CM

## 2023-09-27 DIAGNOSIS — E669 Obesity, unspecified: Secondary | ICD-10-CM | POA: Diagnosis not present

## 2023-09-27 DIAGNOSIS — Z3009 Encounter for other general counseling and advice on contraception: Secondary | ICD-10-CM

## 2023-09-27 DIAGNOSIS — R7303 Prediabetes: Secondary | ICD-10-CM

## 2023-09-27 NOTE — Progress Notes (Signed)
 Office: [phone]  /  Fax: [phone]  WEIGHT SUMMARY AND BIOMETRICS  Anthropometric Measurements Height: 5\' 9"  (1.753 m) Weight: (!) 308 lb (139.7 kg) BMI (Calculated): 45.46 Weight at Last Visit: 305 lb Weight Lost Since Last Visit: 0 Weight Gained Since Last Visit: 3 lb Starting Weight: 363 lb Total Weight Loss (lbs): 55 lb (24.9 kg) Peak Weight: 375 lb   Body Composition  Body Fat %: 51.9 % Fat Mass (lbs): 159.8 lbs Muscle Mass (lbs): 140.8 lbs Total Body Water (lbs): 114 lbs Visceral Fat Rating : 16   Other Clinical Data Fasting: no Labs: no Today's Visit #: 23 Starting Date: 11/02/17    Chief Complaint: OBESITY   History of Present Illness [name] is a 38 year old female with obesity and insulin resistance who presents for obesity treatment plan and progress assessment.  She is adhering to a dietary plan of 1600 to 1700 calories with at least 120 grams of protein daily, approximately 75% of the time. Despite these efforts, she has gained three pounds over the last month. Her exercise routine includes cardio and strength training for a few minutes one to two times per week, but she faces challenges maintaining consistency, particularly with strength training. She notes feeling weak, especially after consuming sugar, and is attempting to detox from sugar, reporting success this week. She struggles with three-pound weights and sporadic exercise.  She has a history of insulin resistance and prediabetes, managed with metformin 500 mg twice daily.   She is planning for family planning with a transfer in early June and is considering the impact of her weight on pregnancy, noting previous weight-related issues during ultrasounds.  She identifies workplace temptation as a challenge, with access to snacks in the staff lounge. She usually packs her own lunch but sometimes succumbs to snacks like protein bars or peanut butter cups. She is working on strategies  to avoid these temptations, such as not entering the lounge.      PHYSICAL EXAM:  Blood pressure 114/69, pulse 64, temperature 98 F (36.7 C), height 5\' 9"  (1.753 m), weight (!) 308 lb (139.7 kg), last menstrual period 08/28/2023, SpO2 91%, unknown if currently breastfeeding. Body mass index is 45.48 kg/m.  DIAGNOSTIC DATA REVIEWED:  BMET    Component Value Date/Time   NA 141 05/24/2023 0920   K 4.3 05/24/2023 0920   CL 105 05/24/2023 0920   CO2 23 05/24/2023 0920   GLUCOSE 74 05/24/2023 0920   GLUCOSE 77 05/10/2022 1329   BUN 12 05/24/2023 0920   CREATININE 0.61 05/24/2023 0920   CREATININE 0.63 05/10/2022 1329   CALCIUM 8.9 05/24/2023 0920   GFRNONAA 116 07/15/2020 1448   GFRAA 134 07/15/2020 1448   Lab Results  Component Value Date   HGBA1C 5.2 05/24/2023   HGBA1C 5.7 09/10/2014   Lab Results  Component Value Date   INSULIN 6.1 05/24/2023   INSULIN 35.6 (H) 11/02/2017   Lab Results  Component Value Date   TSH 1.82 09/14/2023   CBC    Component Value Date/Time   WBC 6.1 05/24/2023 0920   WBC 7.4 05/10/2022 1329   RBC 4.30 05/24/2023 0920   RBC 4.32 05/10/2022 1329   HGB 12.2 05/24/2023 0920   HCT 38.5 05/24/2023 0920   PLT 267 05/24/2023 0920   MCV 90 05/24/2023 0920   MCH 28.4 05/24/2023 0920   MCH 28.9 05/10/2022 1329   MCHC 31.7 05/24/2023 0920   MCHC 33.7 05/10/2022 1329   RDW  12.7 05/24/2023 0920   Iron Studies    Component Value Date/Time   IRON 30 11/02/2017 0904   TIBC 337 11/02/2017 0904   FERRITIN 28 11/02/2017 0904   IRONPCTSAT 9 (LL) 11/02/2017 0904   Lipid Panel     Component Value Date/Time   CHOL 207 (H) 05/24/2023 0920   TRIG 115 05/24/2023 0920   HDL 67 05/24/2023 0920   CHOLHDL 3.0 05/10/2022 1329   VLDL 20.6 05/25/2017 0843   LDLCALC 120 (H) 05/24/2023 0920   LDLCALC 110 (H) 05/10/2022 1329   Hepatic Function Panel     Component Value Date/Time   PROT 6.3 05/24/2023 0920   ALBUMIN 4.0 05/24/2023 0920   AST 11  05/24/2023 0920   ALT 13 05/24/2023 0920   ALKPHOS 76 05/24/2023 0920   BILITOT 0.3 05/24/2023 0920      Component Value Date/Time   TSH 1.82 09/14/2023 0956   Nutritional Lab Results  Component Value Date   VD25OH 40.9 05/24/2023   VD25OH 57.3 10/12/2022   VD25OH 58 05/10/2022     Assessment and Plan Assessment & Plan Obesity She is following a weight loss plan with a caloric intake of 1600-1700 calories and at least 120 grams of protein daily, with 75% adherence. Engages in cardio and strength training exercises inconsistently. Despite efforts, she gained three pounds over the last month. Emphasized the importance of consistency in exercise and journaling food intake. Discussed strategies to improve adherence, such as journaling all food intake and planning meals. Provided a protein-rich pudding recipe as a healthier dessert option. - Continue current diet and exercise plan - Journal all food intake - Incorporate protein-rich pudding as a dessert option - Focus on planning meals to meet dietary goals  Insulin resistance and prediabetes Managed with metformin 500 mg twice daily. Discussed potential benefits of continuing metformin, especially in the context of family planning and potential pregnancy. Highlighted that metformin is generally useful for becoming pregnant, but data on its use during pregnancy is less specific due to ethical concerns in research with pregnant women. Advised to continue metformin until further consultation with her OB in May. - Continue metformin 500 mg twice daily - Consult with OB regarding metformin use in pregnancy  Family planning Planning to transfer early June for family planning purposes. Discussed the role of metformin in pregnancy and the lack of specific data due to ethical concerns in research with pregnant women. Advised to continue current medications and lifestyle modifications until further consultation with her OB. Discussed the  importance of evaluating caloric intake and metabolism rate during pregnancy to avoid excessive weight gain. - Continue current medications and lifestyle modifications - Consult with OB in May regarding family planning and medication use - Evaluate caloric intake and metabolism rate during pregnancy to increase likelihood of gaining only a healthy amount of weight during pregnancy as well as decrease chance of gestational DM   She was informed of the importance of frequent follow up visits to maximize her success with intensive lifestyle modifications for her multiple health conditions.    Quillian Quince, MD

## 2023-09-29 ENCOUNTER — Other Ambulatory Visit (HOSPITAL_COMMUNITY): Payer: Self-pay

## 2023-09-29 ENCOUNTER — Other Ambulatory Visit: Payer: Self-pay

## 2023-09-29 MED ORDER — NORGESTIMATE-ETH ESTRADIOL 0.25-35 MG-MCG PO TABS
1.0000 | ORAL_TABLET | Freq: Every day | ORAL | 2 refills | Status: AC
Start: 2023-09-29 — End: ?
  Filled 2023-09-29 (×2): qty 28, 28d supply, fill #0

## 2023-09-29 MED ORDER — ESTRADIOL 2 MG PO TABS
2.0000 mg | ORAL_TABLET | Freq: Two times a day (BID) | ORAL | 1 refills | Status: DC
  Filled 2023-09-29: qty 90, 30d supply, fill #0
  Filled 2023-09-29: qty 90, 45d supply, fill #0
  Filled 2023-12-08: qty 90, 30d supply, fill #1

## 2023-10-11 DIAGNOSIS — Z113 Encounter for screening for infections with a predominantly sexual mode of transmission: Secondary | ICD-10-CM | POA: Diagnosis not present

## 2023-10-11 DIAGNOSIS — H5213 Myopia, bilateral: Secondary | ICD-10-CM | POA: Diagnosis not present

## 2023-10-11 DIAGNOSIS — Z3141 Encounter for fertility testing: Secondary | ICD-10-CM | POA: Diagnosis not present

## 2023-10-20 ENCOUNTER — Other Ambulatory Visit (HOSPITAL_COMMUNITY): Payer: Self-pay

## 2023-10-20 MED ORDER — NORGESTIMATE-ETH ESTRADIOL 0.25-35 MG-MCG PO TABS
1.0000 | ORAL_TABLET | Freq: Every day | ORAL | 2 refills | Status: DC
  Filled 2023-10-20 – 2023-10-21 (×3): qty 28, 28d supply, fill #0

## 2023-10-21 ENCOUNTER — Other Ambulatory Visit (HOSPITAL_COMMUNITY): Payer: Self-pay

## 2023-10-25 DIAGNOSIS — N856 Intrauterine synechiae: Secondary | ICD-10-CM | POA: Diagnosis not present

## 2023-10-25 DIAGNOSIS — N840 Polyp of corpus uteri: Secondary | ICD-10-CM | POA: Diagnosis not present

## 2023-11-01 DIAGNOSIS — Z3141 Encounter for fertility testing: Secondary | ICD-10-CM | POA: Diagnosis not present

## 2023-11-08 ENCOUNTER — Ambulatory Visit (INDEPENDENT_AMBULATORY_CARE_PROVIDER_SITE_OTHER): Admitting: Physician Assistant

## 2023-11-08 ENCOUNTER — Encounter (INDEPENDENT_AMBULATORY_CARE_PROVIDER_SITE_OTHER): Payer: Self-pay | Admitting: Physician Assistant

## 2023-11-08 VITALS — BP 119/79 | HR 68 | Temp 97.7°F | Ht 69.0 in | Wt 308.0 lb

## 2023-11-08 DIAGNOSIS — E669 Obesity, unspecified: Secondary | ICD-10-CM | POA: Diagnosis not present

## 2023-11-08 DIAGNOSIS — E559 Vitamin D deficiency, unspecified: Secondary | ICD-10-CM

## 2023-11-08 DIAGNOSIS — Z3009 Encounter for other general counseling and advice on contraception: Secondary | ICD-10-CM | POA: Diagnosis not present

## 2023-11-08 DIAGNOSIS — R7303 Prediabetes: Secondary | ICD-10-CM | POA: Diagnosis not present

## 2023-11-08 DIAGNOSIS — Z6841 Body Mass Index (BMI) 40.0 and over, adult: Secondary | ICD-10-CM

## 2023-11-08 NOTE — Progress Notes (Signed)
 SUBJECTIVE: Discussed the use of AI scribe software for clinical note transcription with the patient, who gave verbal consent to proceed.  Chief Complaint: Obesity  Interim History: She has maintained weight loss since last visit. Down 55 lbs.  TBW loss of 15.2%  Recheck IC next visit. Advised to come in at 7:30 for 8 am appt.   Peola is here to discuss her progress with her obesity treatment plan. She is on the keeping a food journal and adhering to recommended goals of 1600-1700 calories and 120+ grams of protein and states she is following her eating plan approximately 80 % of the time. She states she is exercising cardio/strength training 30 minutes 2 times per week.  CHEYAN FREES is a 38 year old female with obesity, prediabetes, and hyperlipidemia who presents for follow-up on her obesity treatment plan.  She is on metformin  500 mg twice daily for prediabetes. She experiences weight fluctuations, with a recent gain despite stable caloric intake. She attributes significant weight gain during her last pregnancy to fluid retention. She is concerned about her metabolism and is considering meal prep services to manage her diet.  She is undergoing IVF treatment and has started Lupron, which has caused significant nausea and fatigue, similar to menopausal symptoms. Reducing the Lupron dose and starting estrogen has improved these symptoms. She experienced weight fluctuations with the initiation of estrogen, losing four pounds in two days, which she attributes to fluid changes.  She has difficulty with meal planning and preparation, especially at home, leading to inconsistent eating patterns. She prioritizes protein intake, particularly at breakfast, to manage hunger throughout the day. She has used meal prep services in the past and is considering returning to them to help manage her diet.  OBJECTIVE: Visit Diagnoses: Problem List Items Addressed This Visit     Vitamin D  deficiency    Prediabetes   BMI 45.0-49.9, adult (HCC)   Obesity, Beginning BMI 55.19   Other Visit Diagnoses       Family planning    -  Primary     Obesity Obesity management includes adjusting caloric intake to a maximum of 1900 calories per day, based on previous metabolic rate assessments once pregnant following planned IVF over the next couple of weeks. Weight fluctuations are noted, with a focus on maintaining stability. Past weight gain during pregnancy was possibly due to fluid retention and high caloric intake from outdated IC assessments.   A fasting indirect calorimetry test is planned to reassess metabolism and provide accurate dietary guidance. - Adjust caloric intake to a maximum of 1900 calories per day when pregnancy confirmed for now based on her previous REE done about 1 year ago, but will recheck IC as well to better guide calorie and protein goals while pregnant . - Prioritize protein intake, especially at breakfast, to manage hunger and snacking. - Consider meal prepping to maintain dietary goals. - Recheck metabolism with a fasting indirect calorimetry test on December 06, 2023, at 7:30 AM.   Family planning She is undergoing IVF treatment, experiencing weight fluctuations due to hormone therapy. Estrogen therapy has led to weight loss, likely from fluid changes. An embryo transfer is planned in the coming weeks. Nausea, a side effect of Lupron used to suppress ovulation during IVF, improved after reducing the Lupron dose and initiating estrogen therapy. Management includes dietary adjustments. -Continue IVF management per fertility providers - Monitor dietary intake to manage nausea and caloric intake.   Prediabetes Prediabetes is managed with metformin  500  mg twice daily. Dietary adjustments are made to manage weight and reduce diabetes progression risk. Lab Results  Component Value Date   HGBA1C 5.2 05/24/2023   HGBA1C 5.4 10/12/2022   HGBA1C 5.4 05/10/2022   Lab Results   Component Value Date   LDLCALC 120 (H) 05/24/2023   CREATININE 0.61 05/24/2023   INSULIN   Date Value Ref Range Status  05/24/2023 6.1 2.6 - 24.9 uIU/mL Final  ] - Continue metformin  500 mg twice daily. No refills needed currently - Monitor dietary intake to manage weight and blood sugar levels.  Continue working on nutrition plan to decrease simple carbohydrates, increase lean proteins and exercise to promote weight loss, improve glycemic control and prevent progression to Type 2 diabetes.   Vitamin D  Deficiency Vitamin D  is not at goal of 50.  Most recent vitamin D  level was 40.9. She is on OTC vitamin D3 2000 IU daily. Lab Results  Component Value Date   VD25OH 40.9 05/24/2023   VD25OH 57.3 10/12/2022   VD25OH 58 05/10/2022    Plan: Continue OTC vitamin D3 2000 IU daily   Follow-up Follow-up appointments are scheduled to monitor progress and adjust treatment plans as necessary. - Follow up with Doctor Alvia Awkward on December 06, 2023, at 8:00 AM. - Schedule a follow-up appointment on January 04, 2024, at 8:30 AM.  Vitals Temp: 97.7 F (36.5 C) BP: 119/79 Pulse Rate: 68 SpO2: 100 %   Anthropometric Measurements Height: 5\' 9"  (1.753 m) Weight: (!) 308 lb (139.7 kg) BMI (Calculated): 45.46 Weight at Last Visit: 308 lb Weight Lost Since Last Visit: 0 Weight Gained Since Last Visit: 0 Starting Weight: 363 lb Total Weight Loss (lbs): 55 lb (24.9 kg) Peak Weight: 375 lb   Body Composition  Body Fat %: 52.5 % Fat Mass (lbs): 161.8 lbs Muscle Mass (lbs): 139 lbs Visceral Fat Rating : 16   Other Clinical Data Fasting: Yes Labs: No Today's Visit #: 36 Starting Date: 11/02/17     ASSESSMENT AND PLAN:  Diet: Charmagne is currently in the action stage of change. As such, her goal is to continue with weight loss efforts. She has agreed to keeping a food journal and adhering to recommended goals of 1600-1700 calories and 120 grams of  protein. Plan for calorie goal of  1900 if IVF successful, but also plan to recheck IC to better guide calorie and protein goals while pregnant.   Exercise: Elliett has been instructed to work up to a goal of 150 minutes of combined cardio and strengthening exercise per week for weight loss and overall health benefits.   Behavior Modification:  We discussed the following Behavioral Modification Strategies today: increasing lean protein intake, decreasing simple carbohydrates, increasing vegetables, increase H2O intake, increase high fiber foods, meal planning and cooking strategies, avoiding temptations, planning for success, and keep a strict food journal. We discussed various medication options to help Nychelle with her weight loss efforts and we both agreed to continue current treatment plan.  Return in about 4 weeks (around 12/06/2023) for Fasting IC.Aaron Aas She was informed of the importance of frequent follow up visits to maximize her success with intensive lifestyle modifications for her multiple health conditions.  Attestation Statements:   Reviewed by clinician on day of visit: allergies, medications, problem list, medical history, surgical history, family history, social history, and previous encounter notes.   Time spent on visit including pre-visit chart review and post-visit care and charting was 35 minutes.    Amreen Raczkowski, PA-C

## 2023-11-15 DIAGNOSIS — Z3141 Encounter for fertility testing: Secondary | ICD-10-CM | POA: Diagnosis not present

## 2023-12-01 DIAGNOSIS — N912 Amenorrhea, unspecified: Secondary | ICD-10-CM | POA: Diagnosis not present

## 2023-12-05 DIAGNOSIS — N912 Amenorrhea, unspecified: Secondary | ICD-10-CM | POA: Diagnosis not present

## 2023-12-06 ENCOUNTER — Other Ambulatory Visit (HOSPITAL_COMMUNITY): Payer: Self-pay

## 2023-12-06 ENCOUNTER — Other Ambulatory Visit: Payer: Self-pay

## 2023-12-06 ENCOUNTER — Encounter (INDEPENDENT_AMBULATORY_CARE_PROVIDER_SITE_OTHER): Payer: Self-pay | Admitting: Family Medicine

## 2023-12-06 ENCOUNTER — Ambulatory Visit (INDEPENDENT_AMBULATORY_CARE_PROVIDER_SITE_OTHER): Admitting: Family Medicine

## 2023-12-06 VITALS — BP 125/76 | HR 72 | Temp 97.8°F | Ht 69.0 in | Wt 312.0 lb

## 2023-12-06 DIAGNOSIS — E559 Vitamin D deficiency, unspecified: Secondary | ICD-10-CM | POA: Diagnosis not present

## 2023-12-06 DIAGNOSIS — R7303 Prediabetes: Secondary | ICD-10-CM | POA: Diagnosis not present

## 2023-12-06 DIAGNOSIS — E7849 Other hyperlipidemia: Secondary | ICD-10-CM

## 2023-12-06 DIAGNOSIS — Z6841 Body Mass Index (BMI) 40.0 and over, adult: Secondary | ICD-10-CM

## 2023-12-06 DIAGNOSIS — D519 Vitamin B12 deficiency anemia, unspecified: Secondary | ICD-10-CM

## 2023-12-06 DIAGNOSIS — E785 Hyperlipidemia, unspecified: Secondary | ICD-10-CM | POA: Diagnosis not present

## 2023-12-06 DIAGNOSIS — R0602 Shortness of breath: Secondary | ICD-10-CM

## 2023-12-06 DIAGNOSIS — Z331 Pregnant state, incidental: Secondary | ICD-10-CM

## 2023-12-06 DIAGNOSIS — E669 Obesity, unspecified: Secondary | ICD-10-CM | POA: Diagnosis not present

## 2023-12-06 MED ORDER — METFORMIN HCL 500 MG PO TABS
500.0000 mg | ORAL_TABLET | Freq: Two times a day (BID) | ORAL | 0 refills | Status: DC
  Filled 2023-12-06: qty 180, 90d supply, fill #0

## 2023-12-06 NOTE — Progress Notes (Signed)
 Office: [phone]  /  Fax: [phone]  WEIGHT SUMMARY AND BIOMETRICS  Anthropometric Measurements Height: 5' 9 (1.753 m) Weight: (!) 312 lb (141.5 kg) BMI (Calculated): 46.05 Weight at Last Visit: 308 lb Weight Lost Since Last Visit: 0 Weight Gained Since Last Visit: 4 lb Starting Weight: 363 lb Total Weight Loss (lbs): 51 lb (23.1 kg) Peak Weight: 375 lb   Body Composition  Body Fat %: 54.8 % Fat Mass (lbs): 171.4 lbs Muscle Mass (lbs): 134.2 lbs Visceral Fat Rating : 17   Other Clinical Data RMR: 2405 Fasting: yes Labs: yes Today's Visit #: 57 Starting Date: 11/02/17    Chief Complaint: OBESITY   History of Present Illness [name] is a 38 year old female with obesity and prediabetes who presents for obesity treatment and pregnancy management.  She is managing her obesity with a caloric intake plan of 1600-1700 calories and 120 or more grams of protein. However, she consumes between 1700 and 1900 calories with 130 grams of protein, achieving her protein goal about 60% of the time. She has gained four pounds in the last month since her last visit. She is attempting to start exercising, currently doing 30 minutes of cardio once a week. She has a history of shortness of breath with exercise and was scheduled to have her indirect calorimetry test repeated today. The test showed her resting energy expenditure to be 2405 and her basal metabolic rate was 2100.  She is experiencing 'all day' queasy nausea, which is somewhat alleviated by keeping food in her stomach, although she craves carbohydrates. She has not yet tried protein shakes.  She is currently pregnant following another IVF procedure and is still under the care of [facility]. She has an upcoming ultrasound on July 1st. She is concerned about weight gain during pregnancy, noting she gained 100 pounds during her last pregnancy, with 60 pounds lost within two weeks postpartum due to swelling. She did  not have preeclampsia but was tested twice for gestational diabetes, which was negative. Her twin sister had gestational diabetes with all three pregnancies. She is worried about swelling and its impact on her heart.  Her heart rate increased to 135 with minimal exertion, which she attributes to possible dehydration. She is currently on both estrogen and progesterone , which can be associated with tachycardia.  She is on metformin  for prediabetes, which [facility] is fine with continuing. She plans to discuss this with her OB GYN, as her previous OB GYN discontinued it during her last pregnancy.      PHYSICAL EXAM:  Blood pressure 125/76, pulse 72, temperature 97.8 F (36.6 C), height 5' 9 (1.753 m), weight (!) 312 lb (141.5 kg), SpO2 100%, unknown if currently breastfeeding. Body mass index is 46.07 kg/m.  DIAGNOSTIC DATA REVIEWED:  BMET    Component Value Date/Time   NA 141 05/24/2023 0920   K 4.3 05/24/2023 0920   CL 105 05/24/2023 0920   CO2 23 05/24/2023 0920   GLUCOSE 74 05/24/2023 0920   GLUCOSE 77 05/10/2022 1329   BUN 12 05/24/2023 0920   CREATININE 0.61 05/24/2023 0920   CREATININE 0.63 05/10/2022 1329   CALCIUM  8.9 05/24/2023 0920   GFRNONAA 116 07/15/2020 1448   GFRAA 134 07/15/2020 1448   Lab Results  Component Value Date   HGBA1C 5.2 05/24/2023   HGBA1C 5.7 09/10/2014   Lab Results  Component Value Date   INSULIN  6.1 05/24/2023   INSULIN  35.6 (H) 11/02/2017   Lab Results  Component Value Date   TSH 1.82 09/14/2023   CBC    Component Value Date/Time   WBC 6.1 05/24/2023 0920   WBC 7.4 05/10/2022 1329   RBC 4.30 05/24/2023 0920   RBC 4.32 05/10/2022 1329   HGB 12.2 05/24/2023 0920   HCT 38.5 05/24/2023 0920   PLT 267 05/24/2023 0920   MCV 90 05/24/2023 0920   MCH 28.4 05/24/2023 0920   MCH 28.9 05/10/2022 1329   MCHC 31.7 05/24/2023 0920   MCHC 33.7 05/10/2022 1329   RDW 12.7 05/24/2023 0920   Iron Studies    Component Value Date/Time    IRON 30 11/02/2017 0904   TIBC 337 11/02/2017 0904   FERRITIN 28 11/02/2017 0904   IRONPCTSAT 9 (LL) 11/02/2017 0904   Lipid Panel     Component Value Date/Time   CHOL 207 (H) 05/24/2023 0920   TRIG 115 05/24/2023 0920   HDL 67 05/24/2023 0920   CHOLHDL 3.0 05/10/2022 1329   VLDL 20.6 05/25/2017 0843   LDLCALC 120 (H) 05/24/2023 0920   LDLCALC 110 (H) 05/10/2022 1329   Hepatic Function Panel     Component Value Date/Time   PROT 6.3 05/24/2023 0920   ALBUMIN 4.0 05/24/2023 0920   AST 11 05/24/2023 0920   ALT 13 05/24/2023 0920   ALKPHOS 76 05/24/2023 0920   BILITOT 0.3 05/24/2023 0920      Component Value Date/Time   TSH 1.82 09/14/2023 0956   Nutritional Lab Results  Component Value Date   VD25OH 40.9 05/24/2023   VD25OH 57.3 10/12/2022   VD25OH 58 05/10/2022     Assessment and Plan Assessment & Plan Pregnancy She is in the early stages of an IVF pregnancy under the care of [facility]. She experiences queasiness and carbohydrate cravings, common in early pregnancy, and is concerned about excessive weight gain, having gained 100 pounds in her previous pregnancy. Her heart rate is elevated with minimal exertion, likely due to dehydration and hormonal changes. She is advised to gain approximately 25 pounds during pregnancy, with most weight gain in the last trimester. - Encourage hydration with small, frequent sips of water - Monitor heart rate and consult GYN if concerns persist - Plan regular follow-up appointments to monitor weight and health during pregnancy - Discuss metformin  use with OB/GYN to assess risk versus benefit - Monitor sodium intake to manage fluid retention  Obesity She manages obesity with a caloric intake plan and protein goals, consuming 1700-1900 calories with 130 grams of protein, but has gained four pounds in the last month. Her resting energy expenditure is 2405 calories, and her basal metabolic rate is 1914 calories. The focus is on  maintaining a healthy weight gain during pregnancy, aiming for a total gain of 25 pounds, with most weight gain in the last trimester. Sodium intake is kept under 2000 mg/day to manage fluid retention. - Continue current dietary plan with focus on protein intake - Encourage use of protein shakes, especially when feeling queasy - Monitor weight gain, aiming for 25 pounds total during pregnancy - Recommend low sodium options to manage fluid retention - Encourage food journaling to monitor intake  Prediabetes She has prediabetes and is on metformin . She is concerned about weight gain during pregnancy and its potential impact on glucose levels. The risk of gestational diabetes is considered, especially given her age, family history and prediabetes history. - Continue metformin  - Discuss metformin  use with OB/GYN - Monitor glucose levels and adjust treatment as necessary  Hyperlipidemia She  has hyperlipidemia and is managing it through diet and exercise. Labs will be checked to monitor her lipid levels. She is not on a statin - Order labs to check lipid levels - Continue dietary and exercise modifications  Vitamin D  Deficiency She has vitamin D  deficiency. Labs will be checked to monitor her vitamin D  levels. - Order labs to check vitamin D  levels  Shortness of breath on exertion Unchanged and Present before pregnancy, Repeat IC done today  shows RMR at 2405. Will monitor her monthly and she will continue with her OB physicians. Will watch her sodium intake and she knows if symptoms worsen to be seen immediately by her OBGYN or the ED if need be.  General Health Maintenance She is advised to maintain a healthy lifestyle through diet, exercise, and hydration. She is encouraged to keep a food journal to monitor her intake and to stay active with light exercise as tolerated. She is advised to use a smartwatch or reminders to promote regular hydration. - Encourage light exercise, such as walking,  as tolerated - Encourage use of a smartwatch or reminders to promote regular hydration  Follow-up Regular follow-up is planned to monitor her health and pregnancy progress. She is advised to schedule appointments as needed and to keep her healthcare providers informed of any changes or concerns. - Schedule follow-up appointment in four weeks - She will consider more frequent appointments if needed - Coordinate care with OB/GYN and other specialists as needed   She was informed of the importance of frequent follow up visits to maximize her success with intensive lifestyle modifications for her multiple health conditions.    Jasmine Mesi, MD

## 2023-12-07 LAB — LIPID PANEL WITH LDL/HDL RATIO
Cholesterol, Total: 185 mg/dL (ref 100–199)
HDL: 73 mg/dL (ref >39)
LDL Chol Calc (NIH): 98 mg/dL (ref 0–99)
LDL/HDL Ratio: 1.3 ratio (ref 0.0–3.2)
Triglycerides: 78 mg/dL (ref 0–149)
VLDL Cholesterol Cal: 14 mg/dL (ref 5–40)

## 2023-12-07 LAB — CMP14+EGFR
ALT: 28 IU/L (ref 0–32)
AST: 14 IU/L (ref 0–40)
Albumin: 4.1 g/dL (ref 3.9–4.9)
Alkaline Phosphatase: 67 IU/L (ref 44–121)
BUN/Creatinine Ratio: 15 (ref 9–23)
BUN: 10 mg/dL (ref 6–20)
Bilirubin Total: 0.4 mg/dL (ref 0.0–1.2)
CO2: 20 mmol/L (ref 20–29)
Calcium: 9.4 mg/dL (ref 8.7–10.2)
Chloride: 105 mmol/L (ref 96–106)
Creatinine, Ser: 0.66 mg/dL (ref 0.57–1.00)
Globulin, Total: 2.6 g/dL (ref 1.5–4.5)
Glucose: 92 mg/dL (ref 70–99)
Potassium: 4.4 mmol/L (ref 3.5–5.2)
Sodium: 139 mmol/L (ref 134–144)
Total Protein: 6.7 g/dL (ref 6.0–8.5)
eGFR: 117 mL/min/{1.73_m2} (ref >59)

## 2023-12-07 LAB — CBC WITH DIFFERENTIAL/PLATELET
Basophils Absolute: 0.1 10*3/uL (ref 0.0–0.2)
Basos: 1 %
EOS (ABSOLUTE): 0.2 10*3/uL (ref 0.0–0.4)
Eos: 4 %
Hematocrit: 40.4 % (ref 34.0–46.6)
Hemoglobin: 12.5 g/dL (ref 11.1–15.9)
Immature Grans (Abs): 0 10*3/uL (ref 0.0–0.1)
Immature Granulocytes: 0 %
Lymphocytes Absolute: 1.8 10*3/uL (ref 0.7–3.1)
Lymphs: 35 %
MCH: 28.7 pg (ref 26.6–33.0)
MCHC: 30.9 g/dL — ABNORMAL LOW (ref 31.5–35.7)
MCV: 93 fL (ref 79–97)
Monocytes Absolute: 0.4 10*3/uL (ref 0.1–0.9)
Monocytes: 9 %
Neutrophils Absolute: 2.7 10*3/uL (ref 1.4–7.0)
Neutrophils: 51 %
Platelets: 279 10*3/uL (ref 150–450)
RBC: 4.36 x10E6/uL (ref 3.77–5.28)
RDW: 12.1 % (ref 11.7–15.4)
WBC: 5.2 10*3/uL (ref 3.4–10.8)

## 2023-12-07 LAB — IRON AND TIBC
Iron Saturation: 34 % (ref 15–55)
Iron: 114 ug/dL (ref 27–159)
Total Iron Binding Capacity: 333 ug/dL (ref 250–450)
UIBC: 219 ug/dL (ref 131–425)

## 2023-12-07 LAB — HEMOGLOBIN A1C
Est. average glucose Bld gHb Est-mCnc: 100 mg/dL
Hgb A1c MFr Bld: 5.1 % (ref 4.8–5.6)

## 2023-12-07 LAB — VITAMIN B12: Vitamin B-12: 1096 pg/mL (ref 232–1245)

## 2023-12-07 LAB — VITAMIN D 25 HYDROXY (VIT D DEFICIENCY, FRACTURES): Vit D, 25-Hydroxy: 60.1 ng/mL (ref 30.0–100.0)

## 2023-12-07 LAB — FOLATE: Folate: >20 ng/mL (ref >3.0)

## 2023-12-07 LAB — FERRITIN: Ferritin: 64 ng/mL (ref 15–150)

## 2023-12-07 LAB — TSH: TSH: 1.52 u[IU]/mL (ref 0.450–4.500)

## 2023-12-07 LAB — INSULIN, RANDOM: INSULIN: 20 u[IU]/mL (ref 2.6–24.9)

## 2023-12-08 ENCOUNTER — Other Ambulatory Visit: Payer: Self-pay

## 2023-12-20 DIAGNOSIS — N912 Amenorrhea, unspecified: Secondary | ICD-10-CM | POA: Diagnosis not present

## 2024-01-03 NOTE — Progress Notes (Signed)
 SUBJECTIVE: Discussed the use of AI scribe software for clinical note transcription with the patient, who gave verbal consent to proceed.  Chief Complaint: Obesity  Interim History: She is up 5 lbs   [name] is here to discuss her progress with her obesity treatment plan. She is on the Journaling plan 1700-1900 calories 130 grams of protein Daily and states she is following her eating plan approximately 50 % of the time. She states she is not exercising.  [name] is a 38 year old female with obesity who presents for follow-up of her obesity treatment plan during pregnancy. She is currently ~ [redacted] weeks pregnant.She is accompanied by her son who is nearly 2 now. .  She is currently nine weeks pregnant and concerned about weight gain during this pregnancy, as she gained 100 pounds during her last pregnancy, losing 60 pounds postpartum due to significant swelling. She did not have preeclampsia or gestational diabetes during her last pregnancy, although her twin sister had gestational diabetes with all three of her pregnancies.  She is experiencing nausea, which she describes as worse than her previous pregnancy, but notes it is improving. The nausea is not constant, with some days being better than others. She is trying to focus on protein intake but finds it challenging due to food aversions, particularly with chicken. Red meat and some cheeses are tolerable, and she is consuming carbs more easily. She is also trying to manage her sodium intake, aiming for 2000 mg per day, but has not been logging her intake recently.  She is on metformin  for prediabetes, which was continued by [facility]. She has reduced the dose to once a day due to stomach upset from increased carb intake. She has not been engaging in formal exercise but is active with her child.  She reports significant fluid retention during her last pregnancy, which led to a rapid weight gain of 10 pounds in a week and  swelling. She is using compression socks to manage swelling and is mindful of her salt intake. She elevates her feet when possible to manage fluid retention.  She has a history of hormone replacement therapy, which she started after experiencing menopause-like symptoms. She noted a significant improvement in her metabolism and weight loss after resuming estrogen post-breastfeeding. She is currently consuming fewer calories than her metabolic rate allows due to nausea, and she is focusing on maintaining rather than losing weight during pregnancy.  OBJECTIVE: Visit Diagnoses: Problem List Items Addressed This Visit     Vitamin D  deficiency   Prediabetes   BMI 45.0-49.9, adult (HCC)   Obesity, Beginning BMI 55.19   Other Visit Diagnoses       IUP (intrauterine pregnancy), incidental, about 8 weeks    -  Primary     Obesity in pregnancy She is pregnant following IVF and concerned about weight gain. She gained 100 pounds during her last pregnancy, losing 60 pounds postpartum due to swelling, without preeclampsia or gestational diabetes. Her twin sister's history of gestational diabetes increases her risk. Nausea is improving, and she is focusing on protein intake. Her metabolism increased after resuming estrogen post-breastfeeding. She is advised to manage fluid retention with compression socks and foot elevation, and engage in light physical activity. - Maintain sodium intake at <2000 mg per day - Log dietary intake - Wear compression socks regularly - Elevate feet when possible - Focus on protein intake, including red meat, eggs, and salmon - Consider low sodium options for saltines and  cheese - Engage in light walking exercises indoors  Prediabetes She is on metformin  for prediabetes, continued at this point.  She plans to discuss metformin  with her OB/GYN, as it was discontinued during her last pregnancy. She reduced metformin  to once daily due to gastrointestinal upset from increased  carbohydrate intake. Her twin sister's gestational diabetes history increases her risk. Continuing metformin  benefits glycemic control and metabolic health, given her family history. - Discuss metformin  use with OB/GYN - Continue metformin  once daily - Monitor carbohydrate intake   Vitamin D  Deficiency Vitamin D  is at goal of 50.  Most recent vitamin D  level was 60.1. She is on OTC vitamin D3 2000 IU daily. Lab Results  Component Value Date   VD25OH 60.1 12/06/2023   VD25OH 40.9 05/24/2023   VD25OH 57.3 10/12/2022    Plan: Continue OTC vitamin D3 2000 IU daily  Vitals Temp: 97.7 F (36.5 C) BP: 116/81 Pulse Rate: 64 SpO2: 100 %   Anthropometric Measurements Height: 5' 9 (1.753 m) Weight: (!) 317 lb (143.8 kg) BMI (Calculated): 46.79 Weight at Last Visit: 312 lb Weight Lost Since Last Visit: 0 Weight Gained Since Last Visit: 5 lb Starting Weight: 363 lb Total Weight Loss (lbs): 46 lb (20.9 kg) Peak Weight: 375 lb   Body Composition  Body Fat %: 54.8 % Fat Mass (lbs): 174 lbs Muscle Mass (lbs): 136.2 lbs Visceral Fat Rating : 17   Other Clinical Data Fasting: No Labs: No Today's Visit #: 72 Starting Date: 11/02/17     ASSESSMENT AND PLAN:  Diet: Kaysha is currently in the action stage of change. As such, her goal is to maintain weight for now. She has agreed to keeping a food journal and adhering to recommended goals of 1700-1900 calories and 130 grams of protein.  Exercise: Jumana has been instructed walking daily for weight loss and overall health benefits.   Behavior Modification:  We discussed the following Behavioral Modification Strategies today: increasing lean protein intake, decreasing simple carbohydrates, increasing vegetables, increase H2O intake, decreasing sodium intake, increase high fiber foods, meal planning and cooking strategies, avoiding temptations, and planning for success. We discussed various medication options to help Tristina  with her weight loss efforts and we both agreed to continue current treatment plan.  Return in about 4 weeks (around 02/01/2024).SABRA She was informed of the importance of frequent follow up visits to maximize her success with intensive lifestyle modifications for her multiple health conditions.  Attestation Statements:   Reviewed by clinician on day of visit: allergies, medications, problem list, medical history, surgical history, family history, social history, and previous encounter notes.   Time spent on visit including pre-visit chart review and post-visit care and charting was 28 minutes.    Lavonne Cass, PA-C

## 2024-01-04 ENCOUNTER — Encounter (INDEPENDENT_AMBULATORY_CARE_PROVIDER_SITE_OTHER): Payer: Self-pay | Admitting: Physician Assistant

## 2024-01-04 ENCOUNTER — Ambulatory Visit (INDEPENDENT_AMBULATORY_CARE_PROVIDER_SITE_OTHER): Admitting: Physician Assistant

## 2024-01-04 VITALS — BP 116/81 | HR 64 | Temp 97.7°F | Ht 69.0 in | Wt 317.0 lb

## 2024-01-04 DIAGNOSIS — Z6841 Body Mass Index (BMI) 40.0 and over, adult: Secondary | ICD-10-CM | POA: Diagnosis not present

## 2024-01-04 DIAGNOSIS — E669 Obesity, unspecified: Secondary | ICD-10-CM

## 2024-01-04 DIAGNOSIS — D519 Vitamin B12 deficiency anemia, unspecified: Secondary | ICD-10-CM

## 2024-01-04 DIAGNOSIS — R7303 Prediabetes: Secondary | ICD-10-CM | POA: Diagnosis not present

## 2024-01-04 DIAGNOSIS — Z3009 Encounter for other general counseling and advice on contraception: Secondary | ICD-10-CM

## 2024-01-04 DIAGNOSIS — E559 Vitamin D deficiency, unspecified: Secondary | ICD-10-CM | POA: Diagnosis not present

## 2024-01-04 DIAGNOSIS — Z331 Pregnant state, incidental: Secondary | ICD-10-CM

## 2024-01-04 DIAGNOSIS — E7849 Other hyperlipidemia: Secondary | ICD-10-CM

## 2024-01-13 DIAGNOSIS — Z3403 Encounter for supervision of normal first pregnancy, third trimester: Secondary | ICD-10-CM | POA: Diagnosis not present

## 2024-01-13 DIAGNOSIS — O09529 Supervision of elderly multigravida, unspecified trimester: Secondary | ICD-10-CM | POA: Diagnosis not present

## 2024-01-21 DIAGNOSIS — Z3481 Encounter for supervision of other normal pregnancy, first trimester: Secondary | ICD-10-CM | POA: Diagnosis not present

## 2024-01-21 DIAGNOSIS — Z31430 Encounter of female for testing for genetic disease carrier status for procreative management: Secondary | ICD-10-CM | POA: Diagnosis not present

## 2024-01-24 DIAGNOSIS — Q8789 Other specified congenital malformation syndromes, not elsewhere classified: Secondary | ICD-10-CM | POA: Diagnosis not present

## 2024-01-24 DIAGNOSIS — Z113 Encounter for screening for infections with a predominantly sexual mode of transmission: Secondary | ICD-10-CM | POA: Diagnosis not present

## 2024-01-24 DIAGNOSIS — O09819 Supervision of pregnancy resulting from assisted reproductive technology, unspecified trimester: Secondary | ICD-10-CM | POA: Diagnosis not present

## 2024-01-31 ENCOUNTER — Ambulatory Visit (INDEPENDENT_AMBULATORY_CARE_PROVIDER_SITE_OTHER): Admitting: Family Medicine

## 2024-01-31 ENCOUNTER — Encounter (INDEPENDENT_AMBULATORY_CARE_PROVIDER_SITE_OTHER): Payer: Self-pay | Admitting: Family Medicine

## 2024-01-31 VITALS — BP 113/74 | HR 67 | Temp 98.0°F | Ht 68.0 in | Wt 319.0 lb

## 2024-01-31 DIAGNOSIS — O99210 Obesity complicating pregnancy, unspecified trimester: Secondary | ICD-10-CM | POA: Insufficient documentation

## 2024-01-31 DIAGNOSIS — Z6841 Body Mass Index (BMI) 40.0 and over, adult: Secondary | ICD-10-CM

## 2024-01-31 DIAGNOSIS — E669 Obesity, unspecified: Secondary | ICD-10-CM

## 2024-01-31 DIAGNOSIS — R7303 Prediabetes: Secondary | ICD-10-CM | POA: Diagnosis not present

## 2024-01-31 NOTE — Progress Notes (Signed)
 Office: [phone]  /  Fax: [phone]  WEIGHT SUMMARY AND BIOMETRICS  Anthropometric Measurements Height: 5' 8 (1.727 m) (rechecked height today) Weight: (!) 319 lb (144.7 kg) BMI (Calculated): 48.51 Weight at Last Visit: 317 lb Weight Lost Since Last Visit: 0 Weight Gained Since Last Visit: 2 lb Starting Weight: 363 lb Total Weight Loss (lbs): 44 lb (20 kg) Peak Weight: 375 lb   Body Composition  Body Fat %: 55.3 % Fat Mass (lbs): 176.4 lbs Muscle Mass (lbs): 135.4 lbs Visceral Fat Rating : 18   Other Clinical Data RMR: 2405 Fasting: no Labs: no Today's Visit #: 90 Starting Date: 11/02/17 Comments: cat 3    Chief Complaint: OBESITY   Discussed the use of AI scribe software for clinical note transcription with the patient, who gave verbal consent to proceed.  History of Present Illness [name] is a 38 year old female who presents for a follow-up on her obesity treatment and progress assessment.  She has been following her category two eating plan 75% of the time and engages in 30 minutes of cardio exercise once a week. Despite these efforts, she has gained two pounds since her last visit one month ago. She discontinued metformin  due to gastrointestinal side effects and has not experienced significant sugar cravings since stopping the medication.  At 13 weeks of pregnancy, her nausea has improved over the past week, allowing her to meal prep lunches for work and adhere to her eating plan. She logs her meals but struggles with protein intake, particularly with chicken, although she can tolerate salmon and red meat. Her carbohydrate intake has been higher than desired, with recent daily intakes of 147, 118, 121, and 130 grams. She consumes between 2000 to 2200 calories per day, aiming to maintain intake above 2000 calories based on activity level and hunger.  Earlier in the pregnancy, she experienced significant fatigue and shortness of breath. She  wondered if this was related to a salty meal she had eaten, but the symptoms have since improved. These symptoms have improved, and she feels capable of increasing her physical activity. Her expected delivery date is February 19th, but she anticipates delivery around Valentine's Day due to IVF pregnancy protocols. A glucose check is scheduled for September 4th at 16 weeks of pregnancy.  Her son, Ryan, is approaching his second birthday, and she is planning a beach trip for the occasion. She is also considering a 'baby moon' and is working on getting Ryan comfortable with overnight stays with family in preparation for the new baby's arrival.      PHYSICAL EXAM:  Blood pressure 113/74, pulse 67, temperature 98 F (36.7 C), height 5' 8 (1.727 m), weight (!) 319 lb (144.7 kg), SpO2 100%, unknown if currently breastfeeding. Body mass index is 48.5 kg/m.  DIAGNOSTIC DATA REVIEWED:  BMET    Component Value Date/Time   NA 139 12/06/2023 0832   K 4.4 12/06/2023 0832   CL 105 12/06/2023 0832   CO2 20 12/06/2023 0832   GLUCOSE 92 12/06/2023 0832   GLUCOSE 77 05/10/2022 1329   BUN 10 12/06/2023 0832   CREATININE 0.66 12/06/2023 0832   CREATININE 0.63 05/10/2022 1329   CALCIUM  9.4 12/06/2023 0832   GFRNONAA 116 07/15/2020 1448   GFRAA 134 07/15/2020 1448   Lab Results  Component Value Date   HGBA1C 5.1 12/06/2023   HGBA1C 5.7 09/10/2014   Lab Results  Component Value Date   INSULIN  20.0 12/06/2023   INSULIN  35.6 (H)  11/02/2017   Lab Results  Component Value Date   TSH 1.520 12/06/2023   CBC    Component Value Date/Time   WBC 5.2 12/06/2023 0832   WBC 7.4 05/10/2022 1329   RBC 4.36 12/06/2023 0832   RBC 4.32 05/10/2022 1329   HGB 12.5 12/06/2023 0832   HCT 40.4 12/06/2023 0832   PLT 279 12/06/2023 0832   MCV 93 12/06/2023 0832   MCH 28.7 12/06/2023 0832   MCH 28.9 05/10/2022 1329   MCHC 30.9 (L) 12/06/2023 0832   MCHC 33.7 05/10/2022 1329   RDW 12.1 12/06/2023 0832    Iron Studies    Component Value Date/Time   IRON 114 12/06/2023 0832   TIBC 333 12/06/2023 0832   FERRITIN 64 12/06/2023 0832   IRONPCTSAT 34 12/06/2023 0832   Lipid Panel     Component Value Date/Time   CHOL 185 12/06/2023 0832   TRIG 78 12/06/2023 0832   HDL 73 12/06/2023 0832   CHOLHDL 3.0 05/10/2022 1329   VLDL 20.6 05/25/2017 0843   LDLCALC 98 12/06/2023 0832   LDLCALC 110 (H) 05/10/2022 1329   Hepatic Function Panel     Component Value Date/Time   PROT 6.7 12/06/2023 0832   ALBUMIN 4.1 12/06/2023 0832   AST 14 12/06/2023 0832   ALT 28 12/06/2023 0832   ALKPHOS 67 12/06/2023 0832   BILITOT 0.4 12/06/2023 0832      Component Value Date/Time   TSH 1.520 12/06/2023 0832   Nutritional Lab Results  Component Value Date   VD25OH 60.1 12/06/2023   VD25OH 40.9 05/24/2023   VD25OH 57.3 10/12/2022     Assessment and Plan Assessment & Plan Obesity in pregnancy Obesity management during pregnancy. Following category two eating plan 75% of the time and exercising 30 minutes one day a week. Gained two pounds since last visit one month ago. Discussed maintaining caloric intake between 2000 to 2400 calories per day, with adjustments based on activity level and hunger. Emphasized maintaining a balanced diet with carbs under 50% of total intake, and protein and healthy fat making up the remainder. Encouraged to continue logging food intake and exercise. - Continue category two eating plan - Maintain caloric intake between 2000 to 2400 calories per day - Log food intake and exercise - Exercise 30 minutes, 3-4 times a week  Intrauterine pregnancy, [redacted] weeks gestation [redacted] weeks gestation with expected delivery date of February 19th. IVF pregnancy likely to be delivered at 39 weeks. Reports improvement in nausea and is able to meal prep. Discussed maintaining a balanced diet and exercise routine. - Continue prenatal care - Maintain balanced diet and exercise routine Goal for 5  l weight gain in both first and second trimester, with 5-10 lb weight gain in 3rd trimester  Prediabetes Prediabetes management through diet and exercise. Stopped metformin  per OB and reports no issues since discontinuation. Monitoring carbohydrate intake and maintaining a balanced diet. No current issues with glucose control reported. Will have first glucose tolerance test at 16 weeks - DC metformin  for now - Monitor carbohydrate intake - Continue balanced diet and exercise      She was informed of the importance of frequent follow up visits to maximize her success with intensive lifestyle modifications for her multiple health conditions.    Louann Penton, MD

## 2024-02-23 DIAGNOSIS — Z3689 Encounter for other specified antenatal screening: Secondary | ICD-10-CM | POA: Diagnosis not present

## 2024-02-23 DIAGNOSIS — Q8789 Other specified congenital malformation syndromes, not elsewhere classified: Secondary | ICD-10-CM | POA: Diagnosis not present

## 2024-02-23 DIAGNOSIS — O09529 Supervision of elderly multigravida, unspecified trimester: Secondary | ICD-10-CM | POA: Diagnosis not present

## 2024-02-23 DIAGNOSIS — E282 Polycystic ovarian syndrome: Secondary | ICD-10-CM | POA: Diagnosis not present

## 2024-02-23 DIAGNOSIS — Z361 Encounter for antenatal screening for raised alphafetoprotein level: Secondary | ICD-10-CM | POA: Diagnosis not present

## 2024-03-01 ENCOUNTER — Other Ambulatory Visit (HOSPITAL_COMMUNITY): Payer: Self-pay

## 2024-03-01 ENCOUNTER — Encounter: Payer: Self-pay | Admitting: Family Medicine

## 2024-03-01 MED ORDER — COMIRNATY 30 MCG/0.3ML IM SUSY
PREFILLED_SYRINGE | Freq: Once | INTRAMUSCULAR | 0 refills | Status: AC
  Filled 2024-03-01: qty 0.2, fill #0
  Filled 2024-03-07: qty 0.3, 1d supply, fill #0

## 2024-03-02 ENCOUNTER — Other Ambulatory Visit (HOSPITAL_COMMUNITY): Payer: Self-pay

## 2024-03-03 ENCOUNTER — Other Ambulatory Visit (HOSPITAL_COMMUNITY): Payer: Self-pay

## 2024-03-05 ENCOUNTER — Ambulatory Visit (INDEPENDENT_AMBULATORY_CARE_PROVIDER_SITE_OTHER): Admitting: Physician Assistant

## 2024-03-05 ENCOUNTER — Encounter (INDEPENDENT_AMBULATORY_CARE_PROVIDER_SITE_OTHER): Payer: Self-pay | Admitting: Physician Assistant

## 2024-03-05 VITALS — BP 109/71 | HR 75 | Temp 97.8°F | Ht 68.0 in | Wt 331.0 lb

## 2024-03-05 DIAGNOSIS — Z6841 Body Mass Index (BMI) 40.0 and over, adult: Secondary | ICD-10-CM | POA: Diagnosis not present

## 2024-03-05 DIAGNOSIS — O1202 Gestational edema, second trimester: Secondary | ICD-10-CM

## 2024-03-05 DIAGNOSIS — E66813 Obesity, class 3: Secondary | ICD-10-CM

## 2024-03-05 DIAGNOSIS — R7303 Prediabetes: Secondary | ICD-10-CM | POA: Diagnosis not present

## 2024-03-05 DIAGNOSIS — E669 Obesity, unspecified: Secondary | ICD-10-CM | POA: Diagnosis not present

## 2024-03-05 DIAGNOSIS — O99210 Obesity complicating pregnancy, unspecified trimester: Secondary | ICD-10-CM

## 2024-03-05 DIAGNOSIS — R609 Edema, unspecified: Secondary | ICD-10-CM | POA: Diagnosis not present

## 2024-03-05 NOTE — Progress Notes (Signed)
 SUBJECTIVE: Discussed the use of AI scribe software for clinical note transcription with the patient, who gave verbal consent to proceed.  Chief Complaint: Obesity  Interim History: She is up 12 lbs since her last visit. She is [redacted] weeks pregnant with a girl!  Muscle mass - 5.6 lbs Adipose mass +18.4 Total body water not obtained, but suspect some water weight gain as feels much more puffy/notes ankle/calf edema.   [name] is here to discuss her progress with her obesity treatment plan. She is on the keeping a food journal and adhering to recommended goals of 2000-2200 calories and 130 grams of protein and states she is following her eating plan approximately 75 % of the time. She states she is exercising walking 30 minutes 1-2 times per week.  [name] is a 38 year old female who presents for follow-up of her obesity treatment plan during pregnancy.  She is currently [redacted] weeks pregnant with an expected delivery date around February 19th but expects to deliver around Feb 14th based on IVF guidelines.  She reports she has gained six pounds in the last four days and experiences puffiness in her legs and tightness in her rings. She has a history of significant weight gain during a previous pregnancy, attributed to fluid retention.  She wears compression socks at home and while working.  Her calves feel tight, especially during flexion and extension, but the tightness has decreased over the past few days. She elevates her legs when possible and uses a body pillow to assist with sleep. No significant heartburn is present, and she manages to sleep on her side.  Her dietary goals include consuming 2000-2200 calories per day with a focus on 130 grams of protein. She has been off metformin  per her OB's recommendations until delivery  and manages her diet well, despite previous nausea that lasted until about 15 weeks of pregnancy. She is mindful of her sodium intake, although recent takeout meals  may have contributed to increased fluid retention. She does note some increased hunger/appetite as her nausea has improved.   Walking is her primary form of physical activity. Shortness of breath and dizziness experienced in the first trimester have improved. She is able to walk comfortably and enjoys outdoor activities with her child. She monitors her weight at home, noting a recent weight of 331 pounds, which aligns with her clinic measurements.  Upcoming: Beach trip and [place] second birthday!   OBJECTIVE: Visit Diagnoses: Problem List Items Addressed This Visit     Prediabetes - Primary   Obesity in pregnancy   Other Visit Diagnoses       Gestational edema in second trimester         Obesity, starting BMI 55.19         BMI 50.0-59.9, adult (HCC) BMI 50.34         Obesity complicating pregnancy, [redacted] weeks gestation, IVF Currently [redacted] weeks pregnant with an IVF pregnancy, expected delivery around February 19th, but anticipates delivery around Valentine's Day due to IVF protocols.  Increased adipose mass, decreased muscle mass, and suspected increase in water weight. Caloric intake goal is 2000-2200 calories per day, with allowance to increase to 2400 if needed/excessively hungry.  Protein intake goal is 130 grams per day, currently achieving around 125-148 grams.  No significant cravings, especially for sugar, and managing well off metformin  with regards to cravings.  Shortness of breath and dizziness in the first trimester have improved. Engaging in walking as a comfortable form of  activity. Blood pressure is 109/71, and heart rate is good. - Continue journaling dietary intake with a focus on caloric and protein goals. - Encourage walking and other comfortable physical activities. - Monitor weight and fluid retention regularly. - Maintain mindful intake of carbohydrates and sodium. - Encourage laying down with legs elevated to aid venous return.  Pregnancy/Gestational  edema Experiencing significant fluid retention, with recent increase in water weight. Legs are puffy, and rings are tight, indicating fluid retention. Compression stockings are worn at home and work, but difficult to manage. Fluid retention is uncomfortable and concerning due to previous experience with significant weight gain during pregnancy. Blood pressure remains well-managed, and no significant puffy areas noted when compression stockings are removed. - Continue wearing compression stockings. - Encourage elevating legs during breaks at work and at home to reduce edema. - Monitor sodium intake, especially when consuming takeout or processed foods. - Encourage regular monitoring of weight and fluid retention.  Prediabetes Previously on metformin , but discontinued by OB. No significant sugar cravings, and dietary management appears effective overall, but does note increased hunger/appetite lately. Blood pressure and heart rate are well-managed, and no significant issues with blood glucose levels reported. - Continue journaling dietary intake, focusing on carbohydrate management/adequate protein intake and minimize sodium intake. - Encourage regular physical activity to maintain glucose control.  General Health Maintenance Plans to receive flu and COVID vaccines. Discussed potential risks of COVID affecting the placenta and the importance of vaccination. Considering RSV vaccination after consulting with OB. - Proceed with flu and COVID vaccinations. - Consult OB regarding RSV vaccination.  Vitals Temp: 97.8 F (36.6 C) BP: 109/71 Pulse Rate: 75 SpO2: 100 %   Anthropometric Measurements Height: 5' 8 (1.727 m) Weight: (!) 331 lb (150.1 kg) BMI (Calculated): 50.34 Weight at Last Visit: 319 lb Weight Lost Since Last Visit: 0 Weight Gained Since Last Visit: 12 lb Starting Weight: 363 lb Total Weight Loss (lbs): 32 lb (14.5 kg) Peak Weight: 375 lb   Body Composition  Body Fat %:  58.8 % Fat Mass (lbs): 194.8 lbs Muscle Mass (lbs): 129.8 lbs Visceral Fat Rating : 20   Other Clinical Data Fasting: No Labs: No Today's Visit #: 91 Starting Date: 11/02/17     ASSESSMENT AND PLAN:  Diet: Macklyn is currently in the action stage of change. As such, her goal is to maintain weight for now. She has agreed to keeping a food journal and adhering to recommended goals of 2000-2400 calories and 130 grams of protein.  Exercise: Tiyonna has been instructed that some exercise is better than none and to continue exercising as is for weight loss and overall health benefits.   Behavior Modification:  We discussed the following Behavioral Modification Strategies today: increasing lean protein intake, decreasing simple carbohydrates, increasing vegetables, increase H2O intake, decreasing sodium intake, increase high fiber foods, decreasing eating out, no skipping meals, travel eating strategies, avoiding temptations, planning for success, and keep a strict food journal. We discussed various medication options to help Stephaney with her weight loss efforts and we both agreed to continue current treatment plan.  Return in about 5 weeks (around 04/09/2024).SABRA She was informed of the importance of frequent follow up visits to maximize her success with intensive lifestyle modifications for her multiple health conditions.  Attestation Statements:   Reviewed by clinician on day of visit: allergies, medications, problem list, medical history, surgical history, family history, social history, and previous encounter notes.   Time spent on visit including pre-visit chart  review and post-visit care and charting was 41 minutes.    Vanessa Mapps, PA-C

## 2024-03-07 ENCOUNTER — Other Ambulatory Visit (HOSPITAL_COMMUNITY): Payer: Self-pay

## 2024-03-07 MED ORDER — FLUZONE 0.5 ML IM SUSY
0.5000 mL | PREFILLED_SYRINGE | Freq: Once | INTRAMUSCULAR | 0 refills | Status: AC
Start: 2024-03-07 — End: 2024-03-08
  Filled 2024-03-07: qty 0.5, 1d supply, fill #0

## 2024-04-10 ENCOUNTER — Ambulatory Visit (INDEPENDENT_AMBULATORY_CARE_PROVIDER_SITE_OTHER): Admitting: Family Medicine

## 2024-04-10 ENCOUNTER — Encounter (INDEPENDENT_AMBULATORY_CARE_PROVIDER_SITE_OTHER): Payer: Self-pay | Admitting: Family Medicine

## 2024-04-10 VITALS — BP 101/68 | HR 69 | Temp 98.2°F | Ht 68.0 in | Wt 333.0 lb

## 2024-04-10 DIAGNOSIS — O1202 Gestational edema, second trimester: Secondary | ICD-10-CM

## 2024-04-10 DIAGNOSIS — R609 Edema, unspecified: Secondary | ICD-10-CM | POA: Diagnosis not present

## 2024-04-10 DIAGNOSIS — R7303 Prediabetes: Secondary | ICD-10-CM

## 2024-04-10 DIAGNOSIS — R110 Nausea: Secondary | ICD-10-CM

## 2024-04-10 DIAGNOSIS — E669 Obesity, unspecified: Secondary | ICD-10-CM

## 2024-04-10 DIAGNOSIS — R4586 Emotional lability: Secondary | ICD-10-CM | POA: Diagnosis not present

## 2024-04-10 DIAGNOSIS — R5383 Other fatigue: Secondary | ICD-10-CM

## 2024-04-10 DIAGNOSIS — Z6841 Body Mass Index (BMI) 40.0 and over, adult: Secondary | ICD-10-CM

## 2024-04-10 DIAGNOSIS — O09812 Supervision of pregnancy resulting from assisted reproductive technology, second trimester: Secondary | ICD-10-CM | POA: Diagnosis not present

## 2024-04-10 DIAGNOSIS — O35BXX1 Maternal care for other (suspected) fetal abnormality and damage, fetal cardiac anomalies, fetus 1: Secondary | ICD-10-CM | POA: Diagnosis not present

## 2024-04-10 DIAGNOSIS — O99210 Obesity complicating pregnancy, unspecified trimester: Secondary | ICD-10-CM

## 2024-04-10 NOTE — Progress Notes (Signed)
 Office: [phone]  /  Fax: [phone]  WEIGHT SUMMARY AND BIOMETRICS  Anthropometric Measurements Height: 5' 8 (1.727 m) Weight: (!) 333 lb (151 kg) (this is with taking 3 lb off for clothes, weight strip was 336 lb) BMI (Calculated): 50.64 Weight at Last Visit: 331 lb Weight Lost Since Last Visit: 0 Weight Gained Since Last Visit: 2 lb Starting Weight: 363 lb Total Weight Loss (lbs): 30 lb (13.6 kg) Peak Weight: 375 lb   No data recorded Other Clinical Data Fasting: no Labs: no Today's Visit #: 3 Starting Date: 11/02/17    Chief Complaint: OBESITY   Discussed the use of AI scribe software for clinical note transcription with the patient, who gave verbal consent to proceed.  History of Present Illness [name] is a 38 year old female who presents for management of her weight during pregnancy.  She is currently [redacted] weeks pregnant and has gained approximately 15 pounds, which is her total pregnancy weight gain goal. She has a history of significant weight gain during her previous pregnancy, gaining 100 pounds, with 50-60 pounds attributed to fluid retention. She is concerned about fluid retention again, noting edema since 18 weeks of pregnancy, which worsens with high salt intake and at the end of the day.  Her current diet includes a focus on meal prepping, with breakfast options like peanut butter sandwiches and egg bites, and struggles with lunch preparation. She often eats at work for convenience but lacks control over the nutritional content. She has been consuming more fast food than desired due to cravings and fatigue, which has recently improved, allowing her to cook more at home. She has difficulty with feeling deprived when not eating specific foods she craves, which she attributes to hormonal changes. She is working on meal planning and is considering options for snacks that are convenient and low in sodium due to her edema. She is also planning to  prepare and freeze meals in anticipation of the third trimester fatigue and postpartum period.  She has experienced emotional fluctuations, describing her current pregnancy as more emotionally challenging than her previous one. She notes increased crying and emotional sensitivity, which she attributes to hormonal changes. Her husband, [name], is supportive but finds the emotional changes challenging.      PHYSICAL EXAM:  Blood pressure 101/68, pulse 69, temperature 98.2 F (36.8 C), height 5' 8 (1.727 m), weight (!) 333 lb (151 kg), SpO2 99%, unknown if currently breastfeeding. Body mass index is 50.63 kg/m.  DIAGNOSTIC DATA REVIEWED:  BMET    Component Value Date/Time   NA 139 12/06/2023 0832   K 4.4 12/06/2023 0832   CL 105 12/06/2023 0832   CO2 20 12/06/2023 0832   GLUCOSE 92 12/06/2023 0832   GLUCOSE 77 05/10/2022 1329   BUN 10 12/06/2023 0832   CREATININE 0.66 12/06/2023 0832   CREATININE 0.63 05/10/2022 1329   CALCIUM  9.4 12/06/2023 0832   GFRNONAA 116 07/15/2020 1448   GFRAA 134 07/15/2020 1448   Lab Results  Component Value Date   HGBA1C 5.1 12/06/2023   HGBA1C 5.7 09/10/2014   Lab Results  Component Value Date   INSULIN  20.0 12/06/2023   INSULIN  35.6 (H) 11/02/2017   Lab Results  Component Value Date   TSH 1.520 12/06/2023   CBC    Component Value Date/Time   WBC 5.2 12/06/2023 0832   WBC 7.4 05/10/2022 1329   RBC 4.36 12/06/2023 0832   RBC 4.32 05/10/2022 1329   HGB 12.5 12/06/2023  0832   HCT 40.4 12/06/2023 0832   PLT 279 12/06/2023 0832   MCV 93 12/06/2023 0832   MCH 28.7 12/06/2023 0832   MCH 28.9 05/10/2022 1329   MCHC 30.9 (L) 12/06/2023 0832   MCHC 33.7 05/10/2022 1329   RDW 12.1 12/06/2023 0832   Iron Studies    Component Value Date/Time   IRON 114 12/06/2023 0832   TIBC 333 12/06/2023 0832   FERRITIN 64 12/06/2023 0832   IRONPCTSAT 34 12/06/2023 0832   Lipid Panel     Component Value Date/Time   CHOL 185 12/06/2023 0832   TRIG  78 12/06/2023 0832   HDL 73 12/06/2023 0832   CHOLHDL 3.0 05/10/2022 1329   VLDL 20.6 05/25/2017 0843   LDLCALC 98 12/06/2023 0832   LDLCALC 110 (H) 05/10/2022 1329   Hepatic Function Panel     Component Value Date/Time   PROT 6.7 12/06/2023 0832   ALBUMIN 4.1 12/06/2023 0832   AST 14 12/06/2023 0832   ALT 28 12/06/2023 0832   ALKPHOS 67 12/06/2023 0832   BILITOT 0.4 12/06/2023 0832      Component Value Date/Time   TSH 1.520 12/06/2023 0832   Nutritional Lab Results  Component Value Date   VD25OH 60.1 12/06/2023   VD25OH 40.9 05/24/2023   VD25OH 57.3 10/12/2022     Assessment and Plan Assessment & Plan Obesity complicating pregnancy, second trimester Obesity complicating pregnancy in the second trimester with a weight gain of 14-15 pounds since conception, aligning with her pregnancy weight gain goal. Challenges include fluid retention and emotional lability affecting dietary choices. She is aware of the need to manage weight gain and is exploring dietary options to reduce caloric intake while maintaining nutritional needs. - Continue to monitor weight gain and fluid retention. - Encouraged meal planning and preparation to manage cravings and avoid high-calorie foods. - Discussed Fiber [employer] products as a lower-calorie alternative for pasta and other foods. - Encouraged bringing prepared meals and snacks to work to avoid high-calorie options. - Scheduled follow-up appointments every two weeks for accountability and support.  Gestational edema, second trimester Gestational edema present since approximately 18 weeks, worsening with high-sodium foods and at the end of the day. She is aware of the need to manage fluid intake and is using compression socks to manage symptoms. - Continue using compression socks to manage edema. - Monitor sodium intake to prevent exacerbation of edema. - Discussed decreasing eating out which always has higher sodium content  Prediabetes in  pregnancy, not currently on metformin  Prediabetes in pregnancy, previously managed with metformin , which has been discontinued by her OBGYN. She reports no significant difference in symptoms after stopping metformin . - Continue diet, exercise and weight loss as discussed today as an important part of the treatment plan   Fatigue in pregnancy, improving Fatigue has been a significant issue but has improved over the last few weeks. She is now able to cook more and manage daily activities better.  Nausea in pregnancy, improving Nausea has significantly improved and is now minimal, with occasional episodes.  Emotional lability in pregnancy Emotional lability is present, with increased sensitivity and emotional responses. She is aware of the hormonal changes contributing to these symptoms and is working on managing them. - Provided emotional support and reassurance.     I personally spent a total of 35 minutes in the care of the patient today including preparing to see the patient, performing a medically appropriate evaluation of current problems, documenting clinical information in the  EMR, customized nutritional counseling for their specific health and social needs, and discussing emotional eating behaviors and how to make strategies to change these behaviors.    Dniya was informed of the importance of frequent follow up visits to maximize her success with intensive lifestyle modifications for her obesity and obesity related health conditions as recommended by USPSTF and CMS guidelines   Louann Penton, MD

## 2024-04-30 ENCOUNTER — Ambulatory Visit (INDEPENDENT_AMBULATORY_CARE_PROVIDER_SITE_OTHER): Admitting: Physician Assistant

## 2024-05-07 ENCOUNTER — Other Ambulatory Visit (HOSPITAL_COMMUNITY): Payer: Self-pay

## 2024-05-07 ENCOUNTER — Ambulatory Visit: Admitting: Family Medicine

## 2024-05-07 ENCOUNTER — Encounter: Payer: Self-pay | Admitting: Family Medicine

## 2024-05-07 VITALS — BP 110/78 | HR 69 | Temp 97.9°F | Wt 343.0 lb

## 2024-05-07 DIAGNOSIS — J209 Acute bronchitis, unspecified: Secondary | ICD-10-CM

## 2024-05-07 MED ORDER — AMOXICILLIN-POT CLAVULANATE 875-125 MG PO TABS
1.0000 | ORAL_TABLET | Freq: Two times a day (BID) | ORAL | 0 refills | Status: DC
  Filled 2024-05-07: qty 20, 10d supply, fill #0

## 2024-05-07 MED ORDER — FLUCONAZOLE 150 MG PO TABS
150.0000 mg | ORAL_TABLET | Freq: Once | ORAL | 0 refills | Status: AC
  Filled 2024-05-07: qty 1, 1d supply, fill #0

## 2024-05-07 NOTE — Progress Notes (Signed)
 [name] , Jun 05, 1986, 38 y.o., female MRN: [id number] Patient Care Team    Relationship Specialty Notifications Start End  [name] LABOR, DO PCP - General Family Medicine  04/25/15   Vanessa Gain, MD Consulting Physician Obstetrics and Gynecology  06/18/16   [name] LABOR, MD Consulting Physician Cardiology  06/18/16   Vanessa Louann JONETTA, MD Consulting Physician Family Medicine  05/26/18    Comment: Weight loss clinic    Chief Complaint  Patient presents with   Cough    2 weeks; cough, congestion, HA.      Subjective: [name] is a 38 y.o. G2P1001.  Patient is approximately [redacted] weeks gestation.  She reports she has been struggling with a viral URI for over 2 weeks now.  Her husband initially had the same virus, they presumed was an adenovirus but other symptoms.  Their toddler also has the same virus. She reports she was struggling with the symptoms and mid last week she started feeling better.  Since then over the weekend her symptoms bounced back rather severely with nasal congestion, cough, headache and fatigue. She has been taking Tylenol  for her discomfort.  She has reached out to her OB for safe medications to use during pregnancy over-the-counter. Patient denies abdominal cramping or vaginal bleeding.  She is feeling her baby kick routinely.     03/23/2021    1:23 PM 10/27/2020    9:55 AM 02/13/2020    9:50 AM 05/22/2019    9:00 AM 11/30/2018   11:37 AM  Depression screen PHQ 2/9  Decreased Interest 0 0 0 0 0  Down, Depressed, Hopeless 0 0 0 0 0  PHQ - 2 Score 0 0 0 0 0  Altered sleeping 0      Tired, decreased energy 0      Change in appetite 0      Feeling bad or failure about yourself  0      Trouble concentrating 0      Moving slowly or fidgety/restless 0      Suicidal thoughts 0      PHQ-9 Score 0          Data saved with a previous flowsheet row definition    No Known Allergies Social History   Social History Narrative   [name] lives alone. 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   Past Medical History:  Diagnosis Date   Anemia    Back pain    BPES syndrome    Enlarged thyroid     Infertility, female    Knee dislocation 2010   Left   Marginal insertion of umbilical cord affecting management of mother in third trimester 03/20/2022   Obesity    Palpitations    PCOS (polycystic ovarian syndrome)    PONV (postoperative nausea and vomiting)    Prediabetes    Premature ovarian failure    Vitamin B 12 deficiency    Vitamin D  deficiency    Wears eyeglasses    Past Surgical History:  Procedure Laterality Date   ADENOIDECTOMY     EYE SURGERY Bilateral    NASAL SEPTOPLASTY W/ TURBINOPLASTY Bilateral 03/04/2017   Procedure: NASAL SEPTOPLASTY WITH BILATERAL INFERIOR TURBINATE REDUCTION;  Surgeon: Mable Lenis, MD;  Location: [facility] OR;  Service: ENT;  Laterality: Bilateral;   NASAL SEPTUM SURGERY  03/04/2017   TONSILLECTOMY     TONSILLECTOMY AND ADENOIDECTOMY Bilateral 03/04/2017  Procedure: TONSILLECTOMY;  Surgeon: Mable Lenis, MD;  Location: [facility] OR;  Service: ENT;  Laterality: Bilateral;   Family History  Problem Relation Age of Onset   Hyperlipidemia Father    Hypertension Father    Heart disease Father    Sudden death Father        Age 36   Other Father        BPES   Heart attack Father    Other Other        BPES   Other Other        BPES   Hyperlipidemia Mother    Hypertension Mother    Diabetes Mother    Anxiety disorder Mother    Obesity Mother    Other Brother        BPES   Other Sister        BPES   Other Paternal Uncle        BPES   Other Cousin        BPES   Other Cousin        BPES   Heart attack Maternal Grandmother    Heart disease Maternal Grandfather    Heart attack Maternal Grandfather    Lung cancer Maternal Grandfather    Heart disease Paternal Grandfather heartr attack   Heart attack Paternal Grandfather 1       MI   Sudden  death Paternal Grandfather    Allergies as of 05/07/2024   No Known Allergies      Medication List        Accurate as of May 07, 2024  2:38 PM. If you have any questions, ask your nurse or doctor.          amoxicillin -clavulanate 875-125 MG tablet Commonly known as: AUGMENTIN  Take 1 tablet by mouth 2 (two) times daily. Started by: Gaines Bellini   estradiol  2 MG tablet Commonly known as: ESTRACE  Take 1 tablet (2 mg total) by mouth 2 (two) times daily. Increase to 3 times daily when directed.   fluconazole  150 MG tablet Commonly known as: DIFLUCAN  Take 1 tablet (150 mg total) by mouth once for 1 dose. Started by: Amere Iott   medroxyPROGESTERone  5 MG tablet Commonly known as: Provera  Take 1 tablet (5 mg total) by mouth daily for 12 days.   metFORMIN  500 MG tablet Commonly known as: GLUCOPHAGE  Take 1 tablet (500 mg total) by mouth 2 (two) times daily with a meal.   Premarin  0.625 MG tablet Generic drug: estrogens  (conjugated) Take 1 tablet (0.625 mg total) by mouth daily.   PRENATAL PO Take 1 tablet by mouth daily.   Vitamin D3 50 MCG (2000 UT) capsule 2,000 IU qd What changed:  how much to take how to take this when to take this additional instructions        All past medical history, surgical history, allergies, family history, immunizations andmedications were updated in the EMR today and reviewed under the history and medication portions of their EMR.     Review of Systems  Constitutional:  Positive for malaise/fatigue. Negative for chills and fever.  HENT:  Positive for congestion, ear pain and sinus pain. Negative for sore throat.   Eyes:  Negative for pain.  Respiratory:  Positive for cough. Negative for shortness of breath and wheezing.   Gastrointestinal:  Negative for abdominal pain, diarrhea, nausea and vomiting.  Musculoskeletal:  Positive for myalgias.  Skin:  Negative for rash.  Neurological:  Positive for headaches. Negative for  dizziness.  Negative, with the exception of above mentioned in HPI   Objective:  BP 110/78   Pulse 69   Temp 97.9 F (36.6 C)   Wt (!) 343 lb (155.6 kg)   LMP  (LMP Unknown)   SpO2 96%   BMI 52.15 kg/m  Body mass index is 52.15 kg/m.  Physical Exam Vitals and nursing note reviewed.  Constitutional:      General: She is not in acute distress.    Appearance: Normal appearance. She is not ill-appearing, toxic-appearing or diaphoretic.  HENT:     Head: Normocephalic and atraumatic.     Nose: Congestion and rhinorrhea present. Rhinorrhea is purulent.     Right Turbinates: Swollen.     Left Turbinates: Swollen.     Right Sinus: Maxillary sinus tenderness present.     Left Sinus: Maxillary sinus tenderness present.     Mouth/Throat:     Mouth: Mucous membranes are moist.     Tongue: No lesions.     Pharynx: Postnasal drip present. No oropharyngeal exudate or posterior oropharyngeal erythema.  Eyes:     General: No scleral icterus.       Right eye: No discharge.        Left eye: No discharge.     Extraocular Movements: Extraocular movements intact.     Conjunctiva/sclera: Conjunctivae normal.     Pupils: Pupils are equal, round, and reactive to light.  Cardiovascular:     Rate and Rhythm: Normal rate and regular rhythm.  Pulmonary:     Effort: Pulmonary effort is normal. No respiratory distress.     Breath sounds: Normal breath sounds. No wheezing, rhonchi or rales.  Musculoskeletal:     Right lower leg: No edema.     Left lower leg: No edema.  Skin:    General: Skin is warm.     Findings: No rash.  Neurological:     Mental Status: She is alert and oriented to person, place, and time. Mental status is at baseline.     Motor: No weakness.     Gait: Gait normal.  Psychiatric:        Mood and Affect: Mood normal.        Behavior: Behavior normal.        Thought Content: Thought content normal.        Judgment: Judgment normal.     No results found. No results  found. No results found for this or any previous visit (from the past 24 hours).  Assessment/Plan: [name] is a 38 y.o. female present for OV for  Acute bronchitis with symptoms greater than 10 days (Primary) Rest, hydrate.  nettie pot or nasal saline flushes twice daily.  Augmentin  prescribed, take until completed.  Called in Diflucan  in the event she would need it, with the approval of her it would be first.  She understands will be to ensure it is safe for her to use. Patient was instructed if not able to tolerate p.o. or symptoms are worsening, any abdominal cramping-she should report to the [facility]U for evaluation.  Reviewed expectations re: course of current medical issues. Discussed self-management of symptoms. Outlined signs and symptoms indicating need for more acute intervention. Patient verbalized understanding and all questions were answered. Patient received an After-Visit Summary.    No orders of the defined types were placed in this encounter.  Meds ordered this encounter  Medications   amoxicillin -clavulanate (AUGMENTIN ) 875-125 MG tablet    Sig: Take 1 tablet by mouth  2 (two) times daily.    Dispense:  20 tablet    Refill:  0   fluconazole  (DIFLUCAN ) 150 MG tablet    Sig: Take 1 tablet (150 mg total) by mouth once for 1 dose.    Dispense:  1 tablet    Refill:  0   Referral Orders  No referral(s) requested today     Note is dictated utilizing voice recognition software. Although note has been proof read prior to signing, occasional typographical errors still can be missed. If any questions arise, please do not hesitate to call for verification.   electronically signed by:  Gaines Bellini, DO  [facility] - OR

## 2024-05-07 NOTE — Patient Instructions (Signed)

## 2024-05-09 ENCOUNTER — Encounter (INDEPENDENT_AMBULATORY_CARE_PROVIDER_SITE_OTHER): Payer: Self-pay | Admitting: Physician Assistant

## 2024-05-09 ENCOUNTER — Telehealth (INDEPENDENT_AMBULATORY_CARE_PROVIDER_SITE_OTHER): Payer: Self-pay | Admitting: Physician Assistant

## 2024-05-09 VITALS — Ht 68.0 in | Wt 333.0 lb

## 2024-05-09 DIAGNOSIS — J209 Acute bronchitis, unspecified: Secondary | ICD-10-CM | POA: Diagnosis not present

## 2024-05-09 DIAGNOSIS — O1202 Gestational edema, second trimester: Secondary | ICD-10-CM

## 2024-05-09 DIAGNOSIS — E669 Obesity, unspecified: Secondary | ICD-10-CM | POA: Diagnosis not present

## 2024-05-09 DIAGNOSIS — Z6841 Body Mass Index (BMI) 40.0 and over, adult: Secondary | ICD-10-CM | POA: Diagnosis not present

## 2024-05-09 DIAGNOSIS — R609 Edema, unspecified: Secondary | ICD-10-CM | POA: Diagnosis not present

## 2024-05-09 DIAGNOSIS — O99210 Obesity complicating pregnancy, unspecified trimester: Secondary | ICD-10-CM

## 2024-05-09 DIAGNOSIS — R7303 Prediabetes: Secondary | ICD-10-CM

## 2024-05-09 NOTE — Progress Notes (Signed)
 Virtual Visit Virtual Visit            TeleHealth Visit:  This visit was completed with telemedicine (audio/video) technology. Dahiana has verbally consented to this TeleHealth visit. The patient is located at home, the provider is located at [facility] office in [place]. The participants in this visit include the listed provider and patient. The visit was conducted today via MyChart video.  SUBJECTIVE: Discussed the use of AI scribe software for clinical note transcription with the patient, who gave verbal consent to proceed.  Chief Complaint: Obesity  Interim History: She feels she gained 5-7 lbs. She is ~[redacted] weeks pregnant with her second child. Sierra)  Akeia is here to discuss her progress with her obesity treatment plan. She is on the keeping a food journal and adhering to recommended goals of 2000-2200 calories and 130 grams of protein and states she is following her eating plan approximately 75 % of the time. She states she is not exercising .  KSENIA KUNZ is a 38 year old female who presents for follow-up of her obesity treatment plan. She is accompanied by her son, Ryan, who is also recovering from a viral illness.  She is currently~ [redacted] weeks pregnant and experiencing gestational edema, which she manages with increased fluid intake and wearing compression socks, especially after long shifts. She has not experienced carpal tunnel symptoms this pregnancy, unlike her previous one.  Recently, she developed an upper respiratory infection, initially presenting with congestion and a severe cough, which worsened over this weekend. She reports that her PCP prescribed Augmentin  and she has used Sudafed to manage congestion. She reports improvement in symptoms but still experiences some cough and congestion.  Her nutritional intake has been affected by the illness, leading to a temporary loss of taste and reduced appetite. Despite this, she maintains hydration and monitors her urine output.  She aims for a daily intake of 2000-2200 calories and 130 grams of protein, though she notes fluctuations in hunger and energy levels.  She is taking prenatal vitamins with iron. She manages her diet to include healthy options, though she occasionally struggles with meal prep and is tempted by less healthy snacks at work. OBJECTIVE: Visit Diagnoses: Problem List Items Addressed This Visit     Prediabetes   Obesity, Beginning BMI 55.19   Obesity in pregnancy - Primary   Other Visit Diagnoses       Acute bronchitis with symptoms greater than 10 days         Gestational edema in second trimester         BMI 50.0-59.9, adult (HCC) Current BMI 50.6         Obesity in pregnancy Obesity management during pregnancy with a focus on maintaining a healthy weight gain. She is consuming approximately 2000- to max of 2400 calories daily, with a protein intake of over 100 grams most days. She is mindful of her diet, especially during Thanksgiving, and is managing cravings well. - Continue current dietary plan with focus on protein intake and healthy eating habits. - Scheduled follow-up appointments every two weeks for accountability and monitoring.  Acute bronchitis during pregnancy Acute bronchitis diagnosed by PCP, treated with Augmentin  and Sudafed. Symptoms include congestion and cough, with improvement noted after starting antibiotics and Sudafed. No fever present. Symptoms are improving, but cough persists, especially at night. - Continue Augmentin  as prescribed. - Use Sudafed as needed for congestion.  Gestational edema Present, managed with compression socks and increased fluid intake. No carpal tunnel  symptoms reported this pregnancy, unlike previous pregnancy. - Continue wearing compression socks to manage edema. - Maintain increased fluid intake and limit salt intake.  Prediabetes in pregnancy Prediabetes previously managed with metformin  500 mg twice daily and discontinued metformin  at  12 weeks as per OB protocol. No issues reported with discontinuation. Mindful of avoiding simple carbohydrates and reports no excessive cravings.  Reports some days is very hungry and will feel like she is eating all day, but then better the following day. Does not feel she is going over 2400 calories on hungrier days.  Plan: Continue working on nutrition plan to decrease simple carbohydrates, increase lean proteins and exercise if able to promote healthy weight gain during pregnancy, improve glycemic control.   No data recorded Anthropometric Measurements Height: 5' 8 (1.727 m) Weight: (!) 333 lb (151 kg) BMI (Calculated): 50.64 Starting Weight: 363 lb Total Weight Loss (lbs): 30 lb (13.6 kg) Peak Weight: 375 lb   No data recorded Other Clinical Data Fasting: No Labs: No Today's Visit #: 110 Starting Date: 11/02/17 Comments: MyChart Video Visit     ASSESSMENT AND PLAN:  Diet: [place] is currently in the action stage of change. As such, her goal is to work on healthy weight gain during pregnancy. She has agreed to keeping a food journal and adhering to recommended goals of 2000-2200 calories and 130 grams of protein.  Exercise: Naylin has been instructed exercise as directed by OB GYN  for healthy weight gain during pregnancy and overall health benefits.   Behavior Modification:  We discussed the following Behavioral Modification Strategies today: increasing lean protein intake, decreasing simple carbohydrates, increasing vegetables, increase H2O intake, decreasing sodium intake, increase high fiber foods, meal planning and cooking strategies, holiday eating strategies, avoiding temptations, planning for success, and keep a strict food journal. We discussed various medication options to help Sarit with her weight loss efforts and we both agreed to continue to work on nutritional and behavioral strategies to promote healthy weight gain during pregnancy.   .  Return in  about 2 weeks (around 05/23/2024).SABRA She was informed of the importance of frequent follow up visits to maximize her success with intensive lifestyle modifications for her multiple health conditions.  Attestation Statements:   Reviewed by clinician on day of visit: allergies, medications, problem list, medical history, surgical history, family history, social history, and previous encounter notes.   Time spent on visit including pre-visit chart review and post-visit care and charting was 25 minutes.    Keyon Liller, PA-C

## 2024-05-21 NOTE — Progress Notes (Unsigned)
 SUBJECTIVE: Discussed the use of AI scribe software for clinical note transcription with the patient, who gave verbal consent to proceed.  Chief Complaint: Obesity  Interim History: She is up 15 lbs since her last in office visit on 04/10/24.  She is now 28+ weeks gestation.   Down 15 lbs overall.  [name] is here to discuss her progress with her obesity treatment plan. She is on the keeping a food journal and adhering to recommended goals of 2000-2400 calories and 130 grams of protein and states she is following her eating plan approximately 50 % of the time. She states she is not exercising.  [name] is a 38 year old female who presents for follow-up of her obesity treatment plan.  She is currently 1, almost [redacted] weeks pregnant and experiencing significant edema, particularly in her feet, which worsens without the use of compression socks and with increased activity. The edema has been exacerbated by recent holiday meals, resulting in a weight increase of about ten pounds, with eight pounds persisting. She attributes some of the weight gain to fluid retention.  She has a lingering cough following a recent bout of bronchitis that occurs when she becomes agitated or overheated, persisting for weeks following a recent upper respiratory infection. She experienced significant chest congestion and coughing following her recent upper respiratory infection. She was treated by her primary care provider and reports improvement overall.  Her dietary habits have been affected by pregnancy, with cravings for sugar following Thanksgiving. She is unable to eat large meals due to the baby sitting higher, causing gastroesophageal reflux and a feeling of fullness. She estimates her daily caloric intake to be around 2400 calories, often eating out for convenience. She has started using a meal prep service to manage her diet better and we also discussed using [name]'s prepared meats as another option to  help with meal prep/planning .  She has a history of prediabetes and is concerned about weight gain during pregnancy. She previously lost 80 pounds before her first child and is hopeful to manage her weight post-pregnancy. She is currently not tracking her diet as consistently as before and we discussed increasing her tracking/journaling over the next few weeks to increase accountability.   OBJECTIVE: Visit Diagnoses: Problem List Items Addressed This Visit     Prediabetes   Obesity, Beginning BMI 55.19   Obesity in pregnancy - Primary   Other Visit Diagnoses       Acute bronchitis with symptoms greater than 10 days         Gestational edema in second trimester         BMI 50.0-59.9, adult (HCC) Current BMI 53.0          Obesity in pregnancy Experiencing significant edema related to pregnancy, with a weight gain of approximately 10 pounds since Thanksgiving. Reports increased swelling in feet, especially after prolonged activity without compression socks. Consuming around 2400 calories per day, with increased eating out due to convenience.  Plans to use [employer] for convenience and to reduce eating out.  Aware of the need to manage weight gain during pregnancy and is considering post-delivery weight management options, including potential use of naltrexone and topiramate if needed to help assist with post pregnancy weight loss. - Continue using [employer] for convenience and to reduce eating out. - Track calorie intake more consistently. - Consider post-delivery weight management options, including naltrexone and topiramate, if needed.  Prediabetes Last A1c was 5.1-  at goal/ Insulin  20.0- not at goal  Medication(s): Metformin  on HOLD while pregnant for now. Will plan to resume post pregnancy.  Polyphagia:Yes Lab Results  Component Value Date   HGBA1C 5.1 12/06/2023   HGBA1C 5.2 05/24/2023   HGBA1C 5.4 10/12/2022   HGBA1C 5.4 05/10/2022   HGBA1C 5.3  03/17/2021   Lab Results  Component Value Date   INSULIN  20.0 12/06/2023   INSULIN  6.1 05/24/2023   INSULIN  8.4 10/12/2022   INSULIN  11.1 03/17/2021   INSULIN  5.3 07/15/2020    Plan:  Continue working on nutrition plan to decrease simple carbohydrates, increase lean proteins and exercise to promote weight loss, improve glycemic control and prevent progression to Type 2 diabetes.  Work on tracking/journaling over the next several weeks to increase accountability and awareness.    Edema, gestational  [redacted] weeks pregnant and experiencing significant edema, particularly in her feet, which worsens without the use of compression socks and with increased activity. The edema has been exacerbated by recent holiday meals, resulting in a weight increase of about ten pounds, with eight pounds persisting. She attributes some of the weight gain to fluid retention. No hypertension in pregnancy.  Plan; Watch and avoid high sodium foods.  Continue compression stockings and monitor closely.  Monitor BP. Continue to work on nutrition plan to promote weight maintenance while pregnant and improve edema control.    Acute bronchitis, resolving Recent episode of acute bronchitis, likely viral in origin, with symptoms including chest congestion and cough. No fever reported. Symptoms have been resolving, and she is feeling much better. - Continue to monitor symptoms and manage as needed. Vitals Temp: 97.7 F (36.5 C) BP: 104/70 Pulse Rate: 81 SpO2: 100 %   Anthropometric Measurements Height: 5' 8 (1.727 m) Weight: (!) 348 lb 6.4 oz (158 kg) BMI (Calculated): 52.99 Weight at Last Visit: 333 lbs Weight Lost Since Last Visit: 0 Weight Gained Since Last Visit: 15 lbs Starting Weight: 363 Total Weight Loss (lbs): 15 lb (6.804 kg) Peak Weight: 375 lbs   Body Composition  Body Fat %: 59.7 % Fat Mass (lbs): 208 lbs Muscle Mass (lbs): 133.4 lbs Visceral Fat Rating : 21   Other Clinical  Data Fasting: yes Labs: no Today's Visit #: 94 Starting Date: 11/02/17     ASSESSMENT AND PLAN:  Diet: [name] is currently in the action stage of change. As such, her goal is to maintain weight for now. She has agreed to keeping a food journal and adhering to recommended goals of 2000-2400 calories and 130 grams of protein.  Exercise: Haylie has been instructed exercise as able/cleared by OB for weight loss and overall health benefits.   Behavior Modification:  We discussed the following Behavioral Modification Strategies today: increasing lean protein intake, decreasing simple carbohydrates, increasing vegetables, increase H2O intake, decreasing sodium intake, increase high fiber foods, decreasing eating out, meal planning and cooking strategies, holiday eating strategies, avoiding temptations, planning for success, and keep a strict food journal. We discussed various medication options to help Sunday with her weight loss efforts and we both agreed to continue to work on nutritional and behavioral strategies to promote weight loss.  .  Return in about 3 weeks (around 06/12/2024).SABRA She was informed of the importance of frequent follow up visits to maximize her success with intensive lifestyle modifications for her multiple health conditions.  Attestation Statements:   Reviewed by clinician on day of visit: allergies, medications, problem list, medical history, surgical history, family history, social history, and previous  encounter notes.   Time spent on visit including pre-visit chart review and post-visit care and charting was 23 minutes.    Lashundra Shiveley, PA-C

## 2024-05-22 ENCOUNTER — Ambulatory Visit (INDEPENDENT_AMBULATORY_CARE_PROVIDER_SITE_OTHER): Payer: Self-pay | Admitting: Physician Assistant

## 2024-05-22 ENCOUNTER — Encounter (INDEPENDENT_AMBULATORY_CARE_PROVIDER_SITE_OTHER): Payer: Self-pay | Admitting: Physician Assistant

## 2024-05-22 VITALS — BP 104/70 | HR 81 | Temp 97.7°F | Ht 68.0 in | Wt 348.4 lb

## 2024-05-22 DIAGNOSIS — R7303 Prediabetes: Secondary | ICD-10-CM | POA: Diagnosis not present

## 2024-05-22 DIAGNOSIS — J209 Acute bronchitis, unspecified: Secondary | ICD-10-CM

## 2024-05-22 DIAGNOSIS — Z6841 Body Mass Index (BMI) 40.0 and over, adult: Secondary | ICD-10-CM

## 2024-05-22 DIAGNOSIS — O09819 Supervision of pregnancy resulting from assisted reproductive technology, unspecified trimester: Secondary | ICD-10-CM | POA: Diagnosis not present

## 2024-05-22 DIAGNOSIS — O99210 Obesity complicating pregnancy, unspecified trimester: Secondary | ICD-10-CM

## 2024-05-22 DIAGNOSIS — O1202 Gestational edema, second trimester: Secondary | ICD-10-CM

## 2024-05-22 DIAGNOSIS — R609 Edema, unspecified: Secondary | ICD-10-CM

## 2024-05-22 DIAGNOSIS — E669 Obesity, unspecified: Secondary | ICD-10-CM

## 2024-05-22 DIAGNOSIS — Z3403 Encounter for supervision of normal first pregnancy, third trimester: Secondary | ICD-10-CM | POA: Diagnosis not present

## 2024-05-22 DIAGNOSIS — Z3689 Encounter for other specified antenatal screening: Secondary | ICD-10-CM | POA: Diagnosis not present

## 2024-05-22 DIAGNOSIS — Q8789 Other specified congenital malformation syndromes, not elsewhere classified: Secondary | ICD-10-CM | POA: Diagnosis not present

## 2024-05-22 DIAGNOSIS — O09529 Supervision of elderly multigravida, unspecified trimester: Secondary | ICD-10-CM | POA: Diagnosis not present

## 2024-05-28 DIAGNOSIS — Z3483 Encounter for supervision of other normal pregnancy, third trimester: Secondary | ICD-10-CM | POA: Diagnosis not present

## 2024-05-28 DIAGNOSIS — Z3482 Encounter for supervision of other normal pregnancy, second trimester: Secondary | ICD-10-CM | POA: Diagnosis not present

## 2024-06-01 DIAGNOSIS — O99810 Abnormal glucose complicating pregnancy: Secondary | ICD-10-CM | POA: Diagnosis not present

## 2024-06-05 ENCOUNTER — Other Ambulatory Visit (HOSPITAL_COMMUNITY): Payer: Self-pay

## 2024-06-11 NOTE — Progress Notes (Unsigned)
 "  SUBJECTIVE: Discussed the use of AI scribe software for clinical note transcription with the patient, who gave verbal consent to proceed.  Chief Complaint: Obesity  Interim History: [name] is up 3 lbs since her last visit.  Down 12 lbs overall  [name] is here to discuss her progress with her obesity treatment plan. [name] is on the keeping a food journal and adhering to recommended goals of 2000-2200 calories and 130 grams of protein and states [name] is following her eating plan approximately 75 % of the time. [name] states [name] is not exercising . [name] is now ~ nearly [redacted] weeks pregnant.   Estimated Date of Delivery: 08/09/24  [name] is a 38 year old female with obesity and prediabetes who presents for follow-up of her obesity treatment plan during pregnancy.  [name] is currently pregnant and managing her prediabetes by monitoring her carbohydrate intake. [name] initially failed her first glucose test by two points, prompting the use of a glucometer and continuous glucose monitoring. [name] subsequently passed her second glucose tolerance test.  [name] experiences gestational edema, which is less severe than in previous pregnancies. The edema is primarily dependent and exacerbated by prolonged sitting. [name] uses compression socks to manage the swelling and notes reduced swelling in her hands compared to previous pregnancies, as evidenced by her rings fitting better.  [name] has difficulty sleeping and increased fatigue. [name] feels more uterine cramping and perceives increased fetal movement compared to her previous pregnancy.  In terms of nutrition, [name] struggles with eating chicken but can tolerate salmon and pork. [name] craves carbohydrates and typically consumes a peanut butter and honey sandwich or hash browns with egg and cheese for breakfast. [name] generally finds turkey and pork agreeable but sometimes struggles with the texture of certain meats.  [name] uses a continuous glucose monitor from [employer], which [name]  finds helpful for tracking trends, although [name] notes discrepancies of 20-30 points between the monitor and finger stick tests during glucose assessments.   OBJECTIVE: Visit Diagnoses: Problem List Items Addressed This Visit     Prediabetes   Obesity, Beginning BMI 55.19   Obesity in pregnancy - Primary   Other Visit Diagnoses       Gestational edema in third trimester         BMI 50.0-59.9, adult (HCC) Current BMI 53.4          Obesity in pregnancy Obesity is complicating her pregnancy. [name] is managing her weight and dietary intake, focusing on protein and reducing carbohydrates. [name] is considering joining [facility] for post-pregnancy weight management and wellness if they relocate to [place]. [name] has done well with minimal weight gain over the past few weeks, despite the holiday celebrations.  - Continue current dietary management focusing on protein intake and reducing carbohydrates. - Consider joining [facility] for post-pregnancy weight management and wellness.  Prediabetes [name] has prediabetes and failed her first glucose test by two points. [name] passed the second GGT and does not have gestational diabetes.   [name] uses a glucometer and a continuous glucose monitor (CGM) to manage her glucose levels and this has been helpful to make her more aware of her carbohydrate intake. [name] has craved carbohydrates more with this pregnancy than her first.  [name] passed her second glucose test, indicating no gestational diabetes. Discussed that the CGM provides trends but is not real-time accurate. Lab Results  Component Value Date   HGBA1C 5.1 12/06/2023   HGBA1C 5.2 05/24/2023   HGBA1C 5.4 10/12/2022   Lab  Results  Component Value Date   LDLCALC 98 12/06/2023   CREATININE 0.66 12/06/2023   Plan: Continue working on nutrition plan to decrease simple carbohydrates, increase lean proteins and exercise to promote weight loss, improve glycemic control and prevent progression to Type 2  diabetes.  - Continue using glucometer and CGM for glucose monitoring. - Monitor carbohydrate intake and maintain awareness of glucose levels.  Gestational edema [name] experiences gestational edema, which has improved but is still present, especially when sitting. [name] manages it with compression socks. No hypertension in pregnancy BP Readings from Last 3 Encounters:  06/12/24 113/71  05/22/24 104/70  05/07/24 110/78   Continue to work on nutrition plan to promote healthy weight in pregnancy and improve edema control.  - Continue using compression socks to manage edema. - Continue monitoring and adjusting salt intake to manage fluid retention. Vitals Temp: 97.7 F (36.5 C) BP: 113/71 Pulse Rate: 72 SpO2: 100 %   Anthropometric Measurements Height: 5' 8 (1.727 m) Weight: (!) 351 lb (159.2 kg) BMI (Calculated): 53.38 Weight at Last Visit: 348 lb Weight Lost Since Last Visit: 0 Weight Gained Since Last Visit: 3 lb Starting Weight: 363 lb Total Weight Loss (lbs): 12 lb (5.443 kg) Peak Weight: 375 lb   Body Composition  Body Fat %: 51.9 % Fat Mass (lbs): 182.2 lbs Muscle Mass (lbs): 160.4 lbs Visceral Fat Rating : 18   Other Clinical Data Fasting: No Labs: No Today's Visit #: 95 Starting Date: 11/02/17     ASSESSMENT AND PLAN:  Diet: [name] is currently in the action stage of change. As such, her goal is to maintain weight for now. [name] has agreed to keeping a food journal and adhering to recommended goals of 2000-2200 calories and 130 grams of protein.  Exercise: [name] has been instructed exercise as cleared by OB during pregnancy for weight loss and overall health benefits.   Behavior Modification:  We discussed the following Behavioral Modification Strategies today: increasing lean protein intake, decreasing simple carbohydrates, increasing vegetables, increase H2O intake, increase high fiber foods, meal planning and cooking strategies, holiday eating  strategies, avoiding temptations, and planning for success. We discussed various medication options to help Emerie with her weight loss efforts and we both agreed to continue to work on nutritional and behavioral strategies to promote healthy weight during pregnancy.   .  Return in about 4 weeks (around 07/10/2024).SABRA [name] was informed of the importance of frequent follow up visits to maximize her success with intensive lifestyle modifications for her multiple health conditions.  Attestation Statements:   Reviewed by clinician on day of visit: allergies, medications, problem list, medical history, surgical history, family history, social history, and previous encounter notes.   Time spent on visit including pre-visit chart review and post-visit care and charting was 25 minutes.    Vanessa Ruppe, [name]  "

## 2024-06-12 ENCOUNTER — Encounter (INDEPENDENT_AMBULATORY_CARE_PROVIDER_SITE_OTHER): Payer: Self-pay | Admitting: Physician Assistant

## 2024-06-12 ENCOUNTER — Ambulatory Visit (INDEPENDENT_AMBULATORY_CARE_PROVIDER_SITE_OTHER): Admitting: Physician Assistant

## 2024-06-12 VITALS — BP 113/71 | HR 72 | Temp 97.7°F | Ht 68.0 in | Wt 351.0 lb

## 2024-06-12 DIAGNOSIS — E669 Obesity, unspecified: Secondary | ICD-10-CM | POA: Diagnosis not present

## 2024-06-12 DIAGNOSIS — R7303 Prediabetes: Secondary | ICD-10-CM

## 2024-06-12 DIAGNOSIS — O1202 Gestational edema, second trimester: Secondary | ICD-10-CM

## 2024-06-12 DIAGNOSIS — Z6841 Body Mass Index (BMI) 40.0 and over, adult: Secondary | ICD-10-CM | POA: Diagnosis not present

## 2024-06-12 DIAGNOSIS — O99210 Obesity complicating pregnancy, unspecified trimester: Secondary | ICD-10-CM

## 2024-06-12 DIAGNOSIS — E559 Vitamin D deficiency, unspecified: Secondary | ICD-10-CM

## 2024-06-12 DIAGNOSIS — O1203 Gestational edema, third trimester: Secondary | ICD-10-CM

## 2024-06-27 ENCOUNTER — Other Ambulatory Visit (HOSPITAL_COMMUNITY): Payer: Self-pay

## 2024-06-27 MED ORDER — ABRYSVO 120 MCG/0.5ML IM SOLR
0.5000 mL | Freq: Once | INTRAMUSCULAR | 0 refills | Status: AC
  Filled 2024-07-03: qty 0.5, 1d supply, fill #0

## 2024-07-03 ENCOUNTER — Other Ambulatory Visit (HOSPITAL_COMMUNITY): Payer: Self-pay

## 2024-07-10 ENCOUNTER — Encounter (INDEPENDENT_AMBULATORY_CARE_PROVIDER_SITE_OTHER): Payer: Self-pay | Admitting: Family Medicine

## 2024-07-10 ENCOUNTER — Ambulatory Visit (INDEPENDENT_AMBULATORY_CARE_PROVIDER_SITE_OTHER): Admitting: Family Medicine

## 2024-07-10 VITALS — BP 113/75 | HR 69 | Temp 98.1°F | Ht 68.0 in | Wt 357.0 lb

## 2024-07-10 DIAGNOSIS — Z6841 Body Mass Index (BMI) 40.0 and over, adult: Secondary | ICD-10-CM | POA: Diagnosis not present

## 2024-07-10 DIAGNOSIS — E669 Obesity, unspecified: Secondary | ICD-10-CM

## 2024-07-10 DIAGNOSIS — O99210 Obesity complicating pregnancy, unspecified trimester: Secondary | ICD-10-CM

## 2024-07-10 DIAGNOSIS — R7303 Prediabetes: Secondary | ICD-10-CM

## 2024-07-10 NOTE — Progress Notes (Signed)
 "  Office: [phone]  /  Fax: [phone]  WEIGHT SUMMARY AND BIOMETRICS  Anthropometric Measurements Height: 5' 8 (1.727 m) Weight: (!) 357 lb (161.9 kg) BMI (Calculated): 54.29 Weight at Last Visit: 351 lb Weight Lost Since Last Visit: 0 Weight Gained Since Last Visit: 6 lb Starting Weight: 363 lb Total Weight Loss (lbs): 6 lb (2.722 kg) Peak Weight: 375 lb   Body Composition  Body Fat %: 54.8 % Fat Mass (lbs): 195.8 lbs Muscle Mass (lbs): 153.2 lbs Visceral Fat Rating : 20   Other Clinical Data Fasting: no Labs: no Today's Visit #: 96 Starting Date: 11/02/17    Chief Complaint: OBESITY   Discussed the use of AI scribe software for clinical note transcription with the patient, who gave verbal consent to proceed.  History of Present Illness [name] is a 39 year old female with obesity and prediabetes who presents for obesity treatment and progress assessment.  She is currently following a Jardiance plan with a target of 2400 calories and 130 grams of protein, adhering to it about 75% of the time. She occasionally tracks her calories but not consistently. Over the last month, she has gained six pounds, which she attributes to the holiday season. She is not engaging in any exercise at present.  In addition to obesity, she is being treated for prediabetes with metformin  500 mg twice a day. She uses a continuous glucose monitor from Dexcom for her gestational diabetes. She initially failed her first glucose test by two points but passed the second glucose tolerance test. She struggles to incorporate enough protein into her diet.  Her stress levels have been high, and she feels 'utterly exhausted most days,' making meal planning challenging. She has not been meal prepping effectively but tries to make better food choices when eating at work or fast food.  She is approximately [redacted] weeks pregnant and plans to take 12 weeks off for maternity leave. Her husband,  Deatrice, will also take 12 weeks off to support her. She has a 106-month-old son who has been very clingy, especially after a virus went through the household in November. She is concerned about managing her son's needs along with the new baby.      PHYSICAL EXAM:  Blood pressure 113/75, pulse 69, temperature 98.1 F (36.7 C), height 5' 8 (1.727 m), weight (!) 357 lb (161.9 kg), SpO2 99%, unknown if currently breastfeeding. Body mass index is 54.28 kg/m.  DIAGNOSTIC DATA REVIEWED BY MYSELF TODAY:  BMET    Component Value Date/Time   NA 139 12/06/2023 0832   K 4.4 12/06/2023 0832   CL 105 12/06/2023 0832   CO2 20 12/06/2023 0832   GLUCOSE 92 12/06/2023 0832   GLUCOSE 77 05/10/2022 1329   BUN 10 12/06/2023 0832   CREATININE 0.66 12/06/2023 0832   CREATININE 0.63 05/10/2022 1329   CALCIUM  9.4 12/06/2023 0832   GFRNONAA 116 07/15/2020 1448   GFRAA 134 07/15/2020 1448   Lab Results  Component Value Date   HGBA1C 5.1 12/06/2023   HGBA1C 5.7 09/10/2014   Lab Results  Component Value Date   INSULIN  20.0 12/06/2023   INSULIN  35.6 (H) 11/02/2017   Lab Results  Component Value Date   TSH 1.520 12/06/2023   CBC    Component Value Date/Time   WBC 5.2 12/06/2023 0832   WBC 7.4 05/10/2022 1329   RBC 4.36 12/06/2023 0832   RBC 4.32 05/10/2022 1329   HGB 12.5 12/06/2023 0832   HCT 40.4  12/06/2023 0832   PLT 279 12/06/2023 0832   MCV 93 12/06/2023 0832   MCH 28.7 12/06/2023 0832   MCH 28.9 05/10/2022 1329   MCHC 30.9 (L) 12/06/2023 0832   MCHC 33.7 05/10/2022 1329   RDW 12.1 12/06/2023 0832   Iron Studies    Component Value Date/Time   IRON 114 12/06/2023 0832   TIBC 333 12/06/2023 0832   FERRITIN 64 12/06/2023 0832   IRONPCTSAT 34 12/06/2023 0832   Lipid Panel     Component Value Date/Time   CHOL 185 12/06/2023 0832   TRIG 78 12/06/2023 0832   HDL 73 12/06/2023 0832   CHOLHDL 3.0 05/10/2022 1329   VLDL 20.6 05/25/2017 0843   LDLCALC 98 12/06/2023 0832    LDLCALC 110 (H) 05/10/2022 1329   Hepatic Function Panel     Component Value Date/Time   PROT 6.7 12/06/2023 0832   ALBUMIN 4.1 12/06/2023 0832   AST 14 12/06/2023 0832   ALT 28 12/06/2023 0832   ALKPHOS 67 12/06/2023 0832   BILITOT 0.4 12/06/2023 0832      Component Value Date/Time   TSH 1.520 12/06/2023 0832   Nutritional Lab Results  Component Value Date   VD25OH 60.1 12/06/2023   VD25OH 40.9 05/24/2023   VD25OH 57.3 10/12/2022     Assessment and Plan Assessment & Plan Obesity complicating pregnancy, third trimester Obesity complicating pregnancy in the third trimester with a recent weight gain of six pounds over the last month, attributed to the holiday period. She is following a dietary plan with 2400 calories and 130 grams of protein, but adherence is approximately 75%. She is not currently exercising. Stress and low energy levels are impacting meal planning. Encouraged to be cautious with weight gain in the last trimester. - Provided a packet of recipes for meal prepping that meet nutritional goals. - Encouraged meal prepping for the last four weeks of pregnancy and postpartum period. - Plan to reassess resting metabolic rate 4-6 weeks postpartum.  Prediabetes Managed with metformin  500 mg twice a day. Emphasis on diet, exercise, and weight loss as part of the management plan. She is encouraged to increase protein intake to help stabilize glucose levels. She is using a continuous glucose monitor from Dexcom for gestational diabetes management. Initial glucose test was failed by two points, but subsequent glucose tolerance test was passed.  - Continue metformin  500 mg twice a day. - Encouraged dietary modifications and increased protein intake, recipes given today       Patients who are on anti-obesity medications are counseled on the importance of maintaining healthy lifestyle habits, including balanced nutrition, regular physical activity, and behavioral  modifications,  Medication is an adjunct to, not a replacement for, lifestyle changes and that the long-term success and weight maintenance depend on continued adherence to these strategies.   Lyniah was informed of the importance of frequent follow up visits to maximize her success with intensive lifestyle modifications for her obesity and obesity related health conditions as recommended by USPSTF and CMS guidelines  Louann Penton, MD   "

## 2024-08-09 ENCOUNTER — Inpatient Hospital Stay (HOSPITAL_COMMUNITY): Admit: 2024-08-09

## 2024-09-03 ENCOUNTER — Ambulatory Visit (INDEPENDENT_AMBULATORY_CARE_PROVIDER_SITE_OTHER): Admitting: Family Medicine
